# Patient Record
Sex: Female | Born: 1945
Health system: Southern US, Community
[De-identification: ages and names within clinical notes are randomized; demographics above are authoritative.]

## PROBLEM LIST (undated history)

## (undated) DIAGNOSIS — F329 Major depressive disorder, single episode, unspecified: Secondary | ICD-10-CM

## (undated) DIAGNOSIS — U071 COVID-19: Secondary | ICD-10-CM

## (undated) DIAGNOSIS — R5383 Other fatigue: Secondary | ICD-10-CM

## (undated) DIAGNOSIS — R200 Anesthesia of skin: Secondary | ICD-10-CM

## (undated) DIAGNOSIS — I10 Essential (primary) hypertension: Secondary | ICD-10-CM

## (undated) DIAGNOSIS — E669 Obesity, unspecified: Secondary | ICD-10-CM

## (undated) DIAGNOSIS — G4700 Insomnia, unspecified: Secondary | ICD-10-CM

## (undated) DIAGNOSIS — R202 Paresthesia of skin: Secondary | ICD-10-CM

## (undated) DIAGNOSIS — J45909 Unspecified asthma, uncomplicated: Secondary | ICD-10-CM

## (undated) DIAGNOSIS — E559 Vitamin D deficiency, unspecified: Secondary | ICD-10-CM

## (undated) DIAGNOSIS — F32A Depression, unspecified: Secondary | ICD-10-CM

## (undated) DIAGNOSIS — M109 Gout, unspecified: Secondary | ICD-10-CM

## (undated) DIAGNOSIS — E119 Type 2 diabetes mellitus without complications: Secondary | ICD-10-CM

## (undated) HISTORY — DX: Essential (primary) hypertension: I10

## (undated) HISTORY — DX: Obesity, unspecified: E66.9

## (undated) HISTORY — PX: ABDOMINAL HYSTERECTOMY: SHX81

## (undated) HISTORY — DX: Type 2 diabetes mellitus without complications: E11.9

## (undated) HISTORY — DX: Major depressive disorder, single episode, unspecified: F32.9

## (undated) HISTORY — DX: Other fatigue: R53.83

## (undated) HISTORY — DX: Paresthesia of skin: R20.2

## (undated) HISTORY — DX: Anesthesia of skin: R20.0

## (undated) HISTORY — DX: Vitamin D deficiency, unspecified: E55.9

## (undated) HISTORY — DX: Insomnia, unspecified: G47.00

## (undated) HISTORY — DX: Depression, unspecified: F32.A

## (undated) HISTORY — DX: Gout, unspecified: M10.9

## (undated) HISTORY — DX: Unspecified asthma, uncomplicated: J45.909

---

## 1998-02-19 ENCOUNTER — Ambulatory Visit (HOSPITAL_COMMUNITY): Admission: RE | Admit: 1998-02-19 | Discharge: 1998-02-19 | Payer: Self-pay | Admitting: Family Medicine

## 1998-09-05 ENCOUNTER — Ambulatory Visit (HOSPITAL_COMMUNITY): Admission: RE | Admit: 1998-09-05 | Discharge: 1998-09-05 | Payer: Self-pay | Admitting: Family Medicine

## 1999-05-14 ENCOUNTER — Emergency Department (HOSPITAL_COMMUNITY): Admission: EM | Admit: 1999-05-14 | Discharge: 1999-05-14 | Payer: Self-pay

## 2000-04-08 ENCOUNTER — Emergency Department (HOSPITAL_COMMUNITY): Admission: EM | Admit: 2000-04-08 | Discharge: 2000-04-09 | Payer: Self-pay | Admitting: Emergency Medicine

## 2000-04-08 ENCOUNTER — Encounter: Payer: Self-pay | Admitting: Emergency Medicine

## 2000-08-29 ENCOUNTER — Ambulatory Visit (HOSPITAL_BASED_OUTPATIENT_CLINIC_OR_DEPARTMENT_OTHER): Admission: RE | Admit: 2000-08-29 | Discharge: 2000-08-29 | Payer: Self-pay | Admitting: Otolaryngology

## 2000-09-13 ENCOUNTER — Encounter: Payer: Self-pay | Admitting: Family Medicine

## 2000-09-13 ENCOUNTER — Encounter: Admission: RE | Admit: 2000-09-13 | Discharge: 2000-09-13 | Payer: Self-pay | Admitting: Family Medicine

## 2001-09-14 ENCOUNTER — Ambulatory Visit (HOSPITAL_COMMUNITY): Admission: RE | Admit: 2001-09-14 | Discharge: 2001-09-14 | Payer: Self-pay | Admitting: Family Medicine

## 2001-09-14 ENCOUNTER — Encounter: Payer: Self-pay | Admitting: Family Medicine

## 2002-06-06 ENCOUNTER — Emergency Department (HOSPITAL_COMMUNITY): Admission: EM | Admit: 2002-06-06 | Discharge: 2002-06-07 | Payer: Self-pay | Admitting: Anesthesiology

## 2002-12-07 ENCOUNTER — Ambulatory Visit (HOSPITAL_COMMUNITY): Admission: RE | Admit: 2002-12-07 | Discharge: 2002-12-07 | Payer: Self-pay | Admitting: Ophthalmology

## 2002-12-15 ENCOUNTER — Inpatient Hospital Stay (HOSPITAL_COMMUNITY): Admission: EM | Admit: 2002-12-15 | Discharge: 2002-12-17 | Payer: Self-pay | Admitting: Emergency Medicine

## 2002-12-16 ENCOUNTER — Encounter: Payer: Self-pay | Admitting: Internal Medicine

## 2005-02-23 ENCOUNTER — Ambulatory Visit: Payer: Self-pay | Admitting: Internal Medicine

## 2005-03-02 ENCOUNTER — Ambulatory Visit: Payer: Self-pay | Admitting: Internal Medicine

## 2005-10-19 ENCOUNTER — Encounter: Admission: RE | Admit: 2005-10-19 | Discharge: 2005-10-19 | Payer: Self-pay | Admitting: Family Medicine

## 2006-04-06 ENCOUNTER — Emergency Department (HOSPITAL_COMMUNITY): Admission: EM | Admit: 2006-04-06 | Discharge: 2006-04-07 | Payer: Self-pay | Admitting: Emergency Medicine

## 2006-11-29 ENCOUNTER — Ambulatory Visit (HOSPITAL_COMMUNITY): Admission: RE | Admit: 2006-11-29 | Discharge: 2006-11-29 | Payer: Self-pay | Admitting: Family Medicine

## 2008-11-01 ENCOUNTER — Ambulatory Visit (HOSPITAL_COMMUNITY): Admission: RE | Admit: 2008-11-01 | Discharge: 2008-11-01 | Payer: Self-pay

## 2010-01-27 ENCOUNTER — Encounter (INDEPENDENT_AMBULATORY_CARE_PROVIDER_SITE_OTHER): Payer: Self-pay | Admitting: *Deleted

## 2010-12-14 ENCOUNTER — Encounter: Payer: Self-pay | Admitting: Family Medicine

## 2010-12-25 NOTE — Letter (Signed)
Summary: Colonoscopy Letter  [facility]  [address] [place], [place] [address]   Phone: [phone]  Fax: [phone]      January 27, 2010 MRN: [id number]   [facility] [address] [place], [place]  [address]   Dear Caitlin Harrington,   According to your medical record, it is time for you to schedule a Colonoscopy. The American Cancer Society recommends this procedure as a method to detect early colon cancer. Patients with a family history of colon cancer, or a personal history of colon polyps or inflammatory bowel disease are at increased risk.  This letter has been generated based on the recommendations made at the time of your procedure. If you feel that in your particular situation this may no longer apply, please contact our office.  Please call our office at [phone] to schedule this appointment or to update your records at your earliest convenience.  Thank you for cooperating with [place] to provide you with the very best care possible.   Sincerely,  Wilhemina Bonito. Marina Goodell, M.D.  [facility] [phone]

## 2011-02-18 DIAGNOSIS — E669 Obesity, unspecified: Secondary | ICD-10-CM | POA: Insufficient documentation

## 2011-02-18 DIAGNOSIS — Z566 Other physical and mental strain related to work: Secondary | ICD-10-CM | POA: Insufficient documentation

## 2011-02-18 DIAGNOSIS — I10 Essential (primary) hypertension: Secondary | ICD-10-CM | POA: Insufficient documentation

## 2011-02-18 DIAGNOSIS — F4321 Adjustment disorder with depressed mood: Secondary | ICD-10-CM | POA: Insufficient documentation

## 2011-02-18 DIAGNOSIS — E7800 Pure hypercholesterolemia, unspecified: Secondary | ICD-10-CM

## 2011-02-18 DIAGNOSIS — E785 Hyperlipidemia, unspecified: Secondary | ICD-10-CM | POA: Insufficient documentation

## 2011-02-18 DIAGNOSIS — E559 Vitamin D deficiency, unspecified: Secondary | ICD-10-CM | POA: Insufficient documentation

## 2011-02-18 DIAGNOSIS — E6609 Other obesity due to excess calories: Secondary | ICD-10-CM | POA: Insufficient documentation

## 2011-02-18 DIAGNOSIS — G4700 Insomnia, unspecified: Secondary | ICD-10-CM | POA: Insufficient documentation

## 2011-02-18 DIAGNOSIS — J4530 Mild persistent asthma, uncomplicated: Secondary | ICD-10-CM | POA: Insufficient documentation

## 2011-02-18 DIAGNOSIS — E119 Type 2 diabetes mellitus without complications: Secondary | ICD-10-CM | POA: Insufficient documentation

## 2011-04-10 NOTE — Discharge Summary (Signed)
   NAME:  Caitlin Harrington, Caitlin Harrington                         ACCOUNT NO.:  [id number]   MEDICAL RECORD NO.:  [id number]                   PATIENT TYPE:  INP   LOCATION:  3008                                 FACILITY:  [facility]   PHYSICIAN:  Ellender Hose. Earlene Plater, N.P.              DATE OF BIRTH:  1945/11/30   DATE OF ADMISSION:  12/15/2002  DATE OF DISCHARGE:  12/17/2002                                 DISCHARGE SUMMARY   ADDENDUM:   PRIMARY CARE PHYSICIAN:  Leodis Sias, M.D.   Regarding the patient's abnormal CT findings, which suggested potential  small bleed in the right internal capsule, the patient underwent MRI of the  brain at [facility]. The findings were reviewed with Neurologic  Radiologist, Dr. Alberteen Spindle. Findings included ischemic changes, which were  also present on a CT of May 2001. There was no indication of acute infarct  or bleed. No pathological cerebral edema and a potentially old lacunar  infarct in the left occipital area. Given these findings, it is felt that  the patient's hypertension did not at this time, cause any acute brain  injury or infarct and she is free of focal deficits at the time of  discharge.   The patient was provided with prescriptions for seven-day supply of her  medications, due to her sister having taken her medications to [place].                                               Ellender Hose. Earlene Plater, N.P.    SMD/MEDQ  D:  12/17/2002  T:  12/17/2002  Job:  [id number]   cc:   Thelma Barge P. Modesto Charon, M.D.  [address]  [place]  [place] [address]  Fax: [phone]

## 2011-04-10 NOTE — Discharge Summary (Signed)
NAME:  Caitlin Harrington, EDELSON                         ACCOUNT NO.:  [id number]   MEDICAL RECORD NO.:  [id number]                   PATIENT TYPE:  INP   LOCATION:  3008                                 FACILITY:  [facility]   PHYSICIAN:  Deirdre Peer. Polite, M.D.              DATE OF BIRTH:  1946-03-05   DATE OF ADMISSION:  12/15/2002  DATE OF DISCHARGE:  12/16/2002                                 DISCHARGE SUMMARY   PATIENT'S PRIMARY CARE PHYSICIAN:  Francis P. Modesto Charon, M.D.   DISCHARGE DIAGNOSES:  1. Headache, resolved.  2. Malignant hypertension, controlled.  3. Abnormal computerized tomography scan of the head, resolved.   DISCHARGE MEDICATIONS:  Altace 10 mg twice a day, Norvasc 5 mg a day, HCTZ  25 mg a day, Prozac 20 mg a day, Claritin 10 mg a day, Advair 250/50 daily..   ALLERGIES:  No known drug allergies.   PROCEDURES:  None.   HISTORY OF PRESENT ILLNESS:  Patient presents with history of hypertension  and diabetes.  Seen at primary MD office today for history of one week of  headache with increased blood pressure.  CT done as an outpatient which  showed questionable small bleed in  the left internal capsule area.  Patient  sent to the emergency room for full evaluation.  Patient denies nausea,  vomiting, chest pain, shortness of breath.  Does have headache.  No  extremity weakness.  No problems with her speech or swallowing.  No visual  disturbance.   Blood pressure in emergency room found to be 160/103.  Pulse was 50.  Patient was admitted for further evaluation.   HOSPITAL COURSE:  Patient had been taking Altace and hydrochlorothiazide for  her blood pressure and was started on Norvasc by Dr. Modesto Charon on the day of  presentation.  These medications were continued with excellent results.  Patient initially required transdermal nitroglycerin for blood pressure  control.  She had excellent response to the above medications as noted.  Blood pressure at time of discharge was 139/73, pulse  was 69, respirations  20.  Patient is free of headache at this time.  Did not experience any  nausea or vomiting during this visit.  Never had any focal neurological  deficits.  She did complain of some blurriness of vision, worse when  reading.  She states that this is improved on day of discharge.   LABORATORY TESTS:  Patient presented with a history of diet-controlled  diabetes.  Her blood sugars during this stay were 113, 117, 100, and 110.  Hemoglobin A1c is 5.9.  These numbers indicate the patient may not actually  have diabetes at this time.  Continued monitoring would be recommended.  Patient is still encouraged to limit concentrated sweets in her diet.   Two sets of cardiac enzymes were performed and were negative.  A 12-lead EKG  revealed normal sinus rhythm with no acute ST wave changes  or ectopy.  Telemetry monitoring revealed the same.  Outpatient echocardiogram maybe  warranted to assess for hypertensive changes to the heart.  Patient is free  of signs or symptoms at this time.   Patient has discharged to home to continue medications as noted above and to  follow up with Dr. Modesto Charon within the week.   DISCHARGE LABS:  Sodium 138, potassium 4.5, BUN 12, creatinine 0.9.  White  blood cell count 10.4, hemoglobin 14.0, hematocrit 40.3, platelet count 306.   CONSULTATIONS:  None.   CONDITION ON DISCHARGE:  Good.   DISPOSITION:  Discharged to home.  Followup is to be with Leodis Sias, M.D.  some time in the next seven days for recheck of her blood pressure and  general hospital followup.     Caitlin Harrington. [name]. Polite, M.D.    SMD/MEDQ  D:  12/17/2002  T:  12/17/2002  Job:  [id number]   cc:   Thelma Barge P. Modesto Charon, M.D.  [address]  [place]  [place] [address]  Fax: [phone]

## 2011-04-10 NOTE — H&P (Signed)
NAME:  Caitlin Harrington, Caitlin Harrington                         ACCOUNT NO.:  [id number]   MEDICAL RECORD NO.:  [id number]                   PATIENT TYPE:  INP   LOCATION:  3312                                 FACILITY:  [facility]   PHYSICIAN:  Georgann Housekeeper, M.D.                 DATE OF BIRTH:  [birth year]   DATE OF ADMISSION:  12/15/2002  DATE OF DISCHARGE:                                HISTORY & PHYSICAL   CHIEF COMPLAINT:  Headaches, elevated blood pressure, and bleed on CT scan  of the head.   PRIMARY CARE PHYSICIAN:  Francis P. Modesto Charon, M.D.   HISTORY OF PRESENT ILLNESS:  A 65 year old female with history of  hypertension, diabetes, and mild asthma, presented to the office of Francis  P. Modesto Charon, M.D. today with history of one week of headaches, found to have  elevated blood pressure, and he did an outpatient CT scan of the head at  [facility], called in report with questionable left capsular bleed,  small.  The patient was sent to the emergency room for further evaluation.  The patient denies any chest pain, shortness of breath, no nausea or  vomiting, positive headaches.  No extremity weakness, no trouble with speech  or swallowing, and no vision problems.  In the emergency room blood pressure  was 116/103 initially.  With nitro paste, it came down to 170/73.  The  patient was still complaining of some headaches.   PAST MEDICAL HISTORY:  1. Hypertension.  2. Asthma.  3. Type 2 diabetes, diet controlled.  4. Gastroesophageal reflux disease, diet controlled.   MEDICATIONS:  1. Altace 10 mg b.i.d.  2. Prozac 20 mg daily.  3. Hydrochlorothiazide 25 mg daily.  4. Advair 250, 1 inhalation b.i.d. p.r.n.  5. Norvasc 5 mg was given today, started by Dr. Modesto Charon.   PAST SURGICAL HISTORY:  None.   SOCIAL HISTORY:  No tobacco, no alcohol, works two jobs, one during the  daytime at [employer] and also does [employer] during the  nighttime.  She is married with one child, and her sister and  her husband  were present.   REVIEW OF SYSTEMS:  Negative.   FAMILY HISTORY:  Noncontributory.   PHYSICAL EXAMINATION:  VITAL SIGNS:  Blood pressure 116/103 initially, at  the time of the exam, 170/83.  Pulse of 80.  Respirations 20, saturations  93%.  GENERAL:  Awake, alert, in no acute distress, complaining of some headache.  LUNGS:  Clear.  CARDIAC:  S1, S2 without any murmurs.  NECK:  There is no bruit.  Supple.  ABDOMEN:  Soft, positive bowel sounds, no bruits.  NEUROLOGIC:  Nonfocal.   LABORATORY DATA:  White count 10.4, hemoglobin 14, platelets 306.  Sodium  136, potassium 4.5, BUN 12, creatinine .9, glucose 113.  LFTs are normal.  Coags normal.  CPK 100, MB 1.2, troponin .01.  EKG was ordered.  Head CT  as  per the report from the [facility] showed question of a left capsular  bleed, small.   IMPRESSION:  A 65 year old female with elevated blood pressure, diabetes,  history of headaches for one week.  CT scan finding of questionable small  cerebral bleed.  Neurologic exam nonfocal.  1. Cerebral bleeding.  2. Malignant hypertension.   PLAN:  1. Admit to telemetry step-down.  2. Blood pressure control.  3. Repeat the scan.  4. MRI in the morning and echocardiogram.  5. Diabetes, diet controlled.  Continue with the diet.  6. Reflux and asthma, stable.                                               Georgann Housekeeper, M.D.    KH/MEDQ  D:  12/15/2002  T:  12/16/2002  Job:  [id number]   cc:   Thelma Barge P. Modesto Charon, M.D.  [address]  [place]  [place] [address]  Fax: [phone]

## 2011-07-01 ENCOUNTER — Telehealth: Payer: Self-pay | Admitting: *Deleted

## 2011-07-01 NOTE — Telephone Encounter (Signed)
Called patient and left message for her to call back and schedule colonoscopy

## 2011-07-09 NOTE — Telephone Encounter (Signed)
Called patient again and no answer.  Left message for her to call and schedule.

## 2012-09-21 ENCOUNTER — Other Ambulatory Visit: Payer: Self-pay | Admitting: Family Medicine

## 2012-09-21 DIAGNOSIS — I998 Other disorder of circulatory system: Secondary | ICD-10-CM

## 2012-09-21 DIAGNOSIS — R51 Headache: Secondary | ICD-10-CM

## 2012-09-22 ENCOUNTER — Ambulatory Visit (HOSPITAL_COMMUNITY)
Admission: RE | Admit: 2012-09-22 | Discharge: 2012-09-22 | Disposition: A | Discharge status: Home / Self Care | Payer: BC Managed Care – PPO | Source: Ambulatory Visit | Attending: Family Medicine | Admitting: Family Medicine

## 2012-09-22 DIAGNOSIS — E119 Type 2 diabetes mellitus without complications: Secondary | ICD-10-CM | POA: Insufficient documentation

## 2012-09-22 DIAGNOSIS — R42 Dizziness and giddiness: Secondary | ICD-10-CM | POA: Insufficient documentation

## 2012-09-22 DIAGNOSIS — R51 Headache: Secondary | ICD-10-CM

## 2012-09-22 DIAGNOSIS — I999 Unspecified disorder of circulatory system: Secondary | ICD-10-CM | POA: Insufficient documentation

## 2012-09-22 DIAGNOSIS — I998 Other disorder of circulatory system: Secondary | ICD-10-CM

## 2012-09-22 MED ORDER — GADOBENATE DIMEGLUMINE 529 MG/ML IV SOLN: 18.0000 mL | Freq: Once | INTRAVENOUS | Status: AC | PRN

## 2013-02-09 ENCOUNTER — Ambulatory Visit: Payer: Self-pay | Admitting: Family Medicine

## 2013-09-14 ENCOUNTER — Telehealth: Payer: Self-pay | Admitting: Family Medicine

## 2013-09-18 ENCOUNTER — Encounter: Payer: Self-pay | Admitting: Family Medicine

## 2013-09-18 ENCOUNTER — Ambulatory Visit (INDEPENDENT_AMBULATORY_CARE_PROVIDER_SITE_OTHER): Payer: Medicare Other | Admitting: Family Medicine

## 2013-09-18 ENCOUNTER — Encounter (INDEPENDENT_AMBULATORY_CARE_PROVIDER_SITE_OTHER): Payer: Self-pay

## 2013-09-18 VITALS — BP 209/123 | HR 75 | Temp 97.2°F | Ht 60.0 in | Wt 196.2 lb

## 2013-09-18 DIAGNOSIS — E559 Vitamin D deficiency, unspecified: Secondary | ICD-10-CM

## 2013-09-18 DIAGNOSIS — M109 Gout, unspecified: Secondary | ICD-10-CM

## 2013-09-18 DIAGNOSIS — E669 Obesity, unspecified: Secondary | ICD-10-CM

## 2013-09-18 DIAGNOSIS — I10 Essential (primary) hypertension: Secondary | ICD-10-CM | POA: Diagnosis not present

## 2013-09-18 DIAGNOSIS — E7800 Pure hypercholesterolemia, unspecified: Secondary | ICD-10-CM | POA: Diagnosis not present

## 2013-09-18 DIAGNOSIS — E119 Type 2 diabetes mellitus without complications: Secondary | ICD-10-CM | POA: Diagnosis not present

## 2013-09-18 DIAGNOSIS — E790 Hyperuricemia without signs of inflammatory arthritis and tophaceous disease: Secondary | ICD-10-CM | POA: Insufficient documentation

## 2013-09-18 DIAGNOSIS — F329 Major depressive disorder, single episode, unspecified: Secondary | ICD-10-CM

## 2013-09-18 DIAGNOSIS — F4321 Adjustment disorder with depressed mood: Secondary | ICD-10-CM

## 2013-09-18 LAB — POCT GLYCOSYLATED HEMOGLOBIN (HGB A1C): Hemoglobin A1C: 6.8

## 2013-09-18 MED ORDER — FLUOXETINE HCL 20 MG PO TABS: 20.0000 mg | ORAL_TABLET | Freq: Every day | ORAL | Status: DC

## 2013-09-18 MED ORDER — COLCHICINE 0.6 MG PO TABS: 0.6000 mg | ORAL_TABLET | Freq: Two times a day (BID) | ORAL | Status: DC

## 2013-09-18 MED ORDER — ATORVASTATIN CALCIUM 10 MG PO TABS: 10.0000 mg | ORAL_TABLET | Freq: Every day | ORAL | Status: DC

## 2013-09-18 MED ORDER — OLMESARTAN-AMLODIPINE-HCTZ 40-5-12.5 MG PO TABS: 1.0000 | ORAL_TABLET | Freq: Every day | ORAL | Status: DC

## 2013-09-18 MED ORDER — BUPROPION HCL 100 MG PO TABS: 100.0000 mg | ORAL_TABLET | Freq: Two times a day (BID) | ORAL | Status: DC

## 2013-09-18 MED ORDER — ALLOPURINOL 100 MG PO TABS: 100.0000 mg | ORAL_TABLET | Freq: Every day | ORAL | Status: DC

## 2013-09-18 MED ORDER — GLIMEPIRIDE 2 MG PO TABS: 2.0000 mg | ORAL_TABLET | Freq: Every day | ORAL | Status: DC

## 2013-09-18 NOTE — Patient Instructions (Signed)
Dr Woodroe Mode Recommendations  For nutrition information, I recommend books:  1).Eat to Live by Dr Monico Hoar. 2).Prevent and Reverse Heart Disease by Dr Suzzette Righter. 3) Dr Katherina Right Book:  Program to Reverse Diabetes  Exercise recommendations are:  If unable to walk, then the patient can exercise in a chair 3 times a day. By flapping arms like a bird gently and raising legs outwards to the front.  If ambulatory, the patient can go for walks for 30 minutes 3 times a week. Then increase the intensity and duration as tolerated.  Goal is to try to attain exercise frequency to 5 times a week.  If applicable: Best to perform resistance exercises (machines or weights) 2 days a week and cardio type exercises 3 days per week.   Gout Gout is an inflammatory condition (arthritis) caused by a buildup of uric acid crystals in the joints. Uric acid is a chemical that is normally present in the blood. Under some circumstances, uric acid can form into crystals in your joints. This causes joint redness, soreness, and swelling (inflammation). Repeat attacks are common. Over time, uric acid crystals can form into masses (tophi) near a joint, causing disfigurement. Gout is treatable and often preventable. CAUSES  The disease begins with elevated levels of uric acid in the blood. Uric acid is produced by your body when it breaks down a naturally found substance called purines. This also happens when you eat certain foods such as meats and fish. Causes of an elevated uric acid level include:  Being passed down from parent to child (heredity).  Diseases that cause increased uric acid production (obesity, psoriasis, some cancers).  Excessive alcohol use.  Diet, especially diets rich in meat and seafood.  Medicines, including certain cancer-fighting drugs (chemotherapy), diuretics, and aspirin.  Chronic kidney disease. The kidneys are no longer able to remove uric acid  well.  Problems with metabolism. Conditions strongly associated with gout include:  Obesity.  High blood pressure.  High cholesterol.  Diabetes. Not everyone with elevated uric acid levels gets gout. It is not understood why some people get gout and others do not. Surgery, joint injury, and eating too much of certain foods are some of the factors that can lead to gout. SYMPTOMS   An attack of gout comes on quickly. It causes intense pain with redness, swelling, and warmth in a joint.  Fever can occur.  Often, only one joint is involved. Certain joints are more commonly involved:  Base of the big toe.  Knee.  Ankle.  Wrist.  Finger. Without treatment, an attack usually goes away in a few days to weeks. Between attacks, you usually will not have symptoms, which is different from many other forms of arthritis. DIAGNOSIS  Your caregiver will suspect gout based on your symptoms and exam. Removal of fluid from the joint (arthrocentesis) is done to check for uric acid crystals. Your caregiver will give you a medicine that numbs the area (local anesthetic) and use a needle to remove joint fluid for exam. Gout is confirmed when uric acid crystals are seen in joint fluid, using a special microscope. Sometimes, blood, urine, and X-ray tests are also used. TREATMENT  There are 2 phases to gout treatment: treating the sudden onset (acute) attack and preventing attacks (prophylaxis). Treatment of an Acute Attack  Medicines are used. These include anti-inflammatory medicines or steroid medicines.  An injection of steroid medicine into the affected joint is sometimes necessary.  The painful  joint is rested. Movement can worsen the arthritis.  You may use warm or cold treatments on painful joints, depending which works best for you.  Discuss the use of coffee, vitamin C, or cherries with your caregiver. These may be helpful treatment options. Treatment to Prevent Attacks After the acute  attack subsides, your caregiver may advise prophylactic medicine. These medicines either help your kidneys eliminate uric acid from your body or decrease your uric acid production. You may need to stay on these medicines for a very long time. The early phase of treatment with prophylactic medicine can be associated with an increase in acute gout attacks. For this reason, during the first few months of treatment, your caregiver may also advise you to take medicines usually used for acute gout treatment. Be sure you understand your caregiver's directions. You should also discuss dietary treatment with your caregiver. Certain foods such as meats and fish can increase uric acid levels. Other foods such as dairy can decrease levels. Your caregiver can give you a list of foods to avoid. HOME CARE INSTRUCTIONS   Do not take aspirin to relieve pain. This raises uric acid levels.  Only take over-the-counter or prescription medicines for pain, discomfort, or fever as directed by your caregiver.  Rest the joint as much as possible. When in bed, keep sheets and blankets off painful areas.  Keep the affected joint raised (elevated).  Use crutches if the painful joint is in your leg.  Drink enough water and fluids to keep your urine clear or pale yellow. This helps your body get rid of uric acid. Do not drink alcoholic beverages. They slow the passage of uric acid.  Follow your caregiver's dietary instructions. Pay careful attention to the amount of protein you eat. Your daily diet should emphasize fruits, vegetables, whole grains, and fat-free or low-fat milk products.  Maintain a healthy body weight. SEEK MEDICAL CARE IF:   You have an oral temperature above 102 F (38.9 C).  You develop diarrhea, vomiting, or any side effects from medicines.  You do not feel better in 24 hours, or you are getting worse. SEEK IMMEDIATE MEDICAL CARE IF:   Your joint becomes suddenly more tender and you  have:  Chills.  An oral temperature above 102 F (38.9 C), not controlled by medicine. MAKE SURE YOU:   Understand these instructions.  Will watch your condition.  Will get help right away if you are not doing well or get worse. Document Released: 11/06/2000 Document Revised: 02/01/2012 Document Reviewed: 02/17/2010 [facility] Patient Information 2014 [place], [place].   Purine Restricted Diet A low-purine diet consists of foods that reduce uric acid made in your body. INDICATIONS FOR USE  Your caregiver may ask you to follow a low-purine diet to reduce gout flairs.  GUIDELINES  Avoid high-purine foods, including all alcohol, yeast extracts taken as supplements, and sauces made from meats (like gravy). Do not eat high-purine meats, including anchovies, sardines, herring, mussels, tuna, codfish, scallops, trout, haddock, bacon, organ meats, tripe, goose, wild game, and sweetbreads.  Grains  Allowed/Recommended: All, except those listed to consume in moderation.  Consume in Moderation: Oatmeal ( cup uncooked daily), wheat bran or germ ( cup daily), and whole grains. Vegetables  Allowed/Recommended: All, except those listed to consume in moderation.  Consume in Moderation: Asparagus, cauliflower, spinach, mushrooms, and green peas ( cup daily). Fruit  Allowed/Recommended: All.  Consume in Moderation: None. Meat and Meat Substitutes  Allowed/Recommended: Eggs, nuts, and peanut butter.  Consume in  Moderation: Limit to 4 to 6 oz daily. Avoid high-purine meats. Lentils, peas, and dried beans (1 cup daily). Milk  Allowed/Recommended: All. Choose low-fat or skim when possible.  Consume in Moderation: None. Fats and Oils  Allowed/Recommended: All.  Consume in Moderation: None. Beverages  Allowed/Recommended: All, except those listed to avoid.  Avoid: All alcohol. Condiments/Miscellaneous  Allowed/Recommended: All, except those listed to consume in  moderation.  Consume in Moderation: Bouillon and meat-based broths and soups. Document Released: 03/06/2011 Document Revised: 02/01/2012 Document Reviewed: 03/06/2011 [facility] Patient Information 2014 [place], [place].   DASH Diet The DASH diet stands for "Dietary Approaches to Stop Hypertension." It is a healthy eating plan that has been shown to reduce high blood pressure (hypertension) in as little as 14 days, while also possibly providing other significant health benefits. These other health benefits include reducing the risk of breast cancer after menopause and reducing the risk of type 2 diabetes, heart disease, colon cancer, and stroke. Health benefits also include weight loss and slowing kidney failure in patients with chronic kidney disease.  DIET GUIDELINES  Limit salt (sodium). Your diet should contain less than 1500 mg of sodium daily.  Limit refined or processed carbohydrates. Your diet should include mostly whole grains. Desserts and added sugars should be used sparingly.  Include small amounts of heart-healthy fats. These types of fats include nuts, oils, and tub margarine. Limit saturated and trans fats. These fats have been shown to be harmful in the body. CHOOSING FOODS  The following food groups are based on a 2000 calorie diet. See your Registered Dietitian for individual calorie needs. Grains and Grain Products (6 to 8 servings daily)  Eat More Often: Whole-wheat bread, brown rice, whole-grain or wheat pasta, quinoa, popcorn without added fat or salt (air popped).  Eat Less Often: White bread, white pasta, white rice, cornbread. Vegetables (4 to 5 servings daily)  Eat More Often: Fresh, frozen, and canned vegetables. Vegetables may be raw, steamed, roasted, or grilled with a minimal amount of fat.  Eat Less Often/Avoid: Creamed or fried vegetables. Vegetables in a cheese sauce. Fruit (4 to 5 servings daily)  Eat More Often: All fresh, canned (in natural juice), or  frozen fruits. Dried fruits without added sugar. One hundred percent fruit juice ( cup [237 mL] daily).  Eat Less Often: Dried fruits with added sugar. Canned fruit in light or heavy syrup. [employer], Fish, and Poultry (2 servings or less daily. One serving is 3 to 4 oz [85-114 g]).  Eat More Often: Ninety percent or leaner ground beef, tenderloin, sirloin. Round cuts of beef, chicken breast, [place] breast. All fish. Grill, bake, or broil your meat. Nothing should be fried.  Eat Less Often/Avoid: Fatty cuts of meat, [place], or chicken leg, thigh, or wing. Fried cuts of meat or fish. Dairy (2 to 3 servings)  Eat More Often: Low-fat or fat-free milk, low-fat plain or light yogurt, reduced-fat or part-skim cheese.  Eat Less Often/Avoid: Milk (whole, 2%).Whole milk yogurt. Full-fat cheeses. Nuts, Seeds, and Legumes (4 to 5 servings per week)  Eat More Often: All without added salt.  Eat Less Often/Avoid: Salted nuts and seeds, canned beans with added salt. Fats and Sweets (limited)  Eat More Often: Vegetable oils, tub margarines without trans fats, sugar-free gelatin. Mayonnaise and salad dressings.  Eat Less Often/Avoid: Coconut oils, palm oils, butter, stick margarine, cream, half and half, cookies, candy, pie. FOR MORE INFORMATION The Dash Diet Eating Plan: www.dashdiet.org Document Released: 10/29/2011 Document Revised: 02/01/2012  Document Reviewed: 10/29/2011 [facility] Patient Information 2014 [place], [place].   Diabetes and Foot Care Diabetes may cause you to have a poor blood supply (circulation) to your legs and feet. Because of this, the skin may be thinner, break easier, and heal more slowly. You also may have nerve damage in your legs and feet causing decreased feeling. You may not notice minor injuries to your feet that could lead to serious problems or infections. Taking care of your feet is one of the most important things you can do for yourself.  HOME CARE  INSTRUCTIONS  Do not go barefoot. Bare feet are easily injured.  Check your feet daily for blisters, cuts, and redness.  Wash your feet with warm water (not hot) and mild soap. Pat your feet and between your toes until completely dry.  Apply a moisturizing lotion that does not contain alcohol or petroleum jelly to the dry skin on your feet and to dry brittle toenails. Do not put it between your toes.  Trim your toenails straight across. Do not dig under them or around the cuticle.  Do not cut corns or calluses, or try to remove them with medicine.  Wear clean cotton socks or stockings every day. Make sure they are not too tight. Do not wear knee high stockings since they may decrease blood flow to your legs.  Wear leather shoes that fit properly and have enough cushioning. To break in new shoes, wear them just a few hours a day to avoid injuring your feet.  Wear shoes at all times, even in the house.  Do not cross your legs. This may decrease the blood flow to your feet.  If you find a minor scrape, cut, or break in the skin on your feet, keep it and the skin around it clean and dry. These areas may be cleansed with mild soap and water. Do not use peroxide, alcohol, iodine or Merthiolate.  When you remove an adhesive bandage, be sure not to harm the skin around it.  If you have a wound, look at it several times a day to make sure it is healing.  Do not use heating pads or hot water bottles. Burns can occur. If you have lost feeling in your feet or legs, you may not know it is happening until it is too late.  Report any cuts, sores or bruises to your caregiver. Do not wait! SEEK MEDICAL CARE IF:   You have an injury that is not healing or you notice redness, numbness, burning, or tingling.  Your feet always feel cold.  You have pain or cramps in your legs and feet. SEEK IMMEDIATE MEDICAL CARE IF:   There is increasing redness, swelling, or increasing pain in the wound.  There  is a red line that goes up your leg.  Pus is coming from a wound.  You develop an unexplained oral temperature above 102 F (38.9 C), or as your caregiver suggests.  You notice a bad smell coming from an ulcer or wound. MAKE SURE YOU:   Understand these instructions.  Will watch your condition.  Will get help right away if you are not doing well or get worse. Document Released: 11/06/2000 Document Revised: 02/01/2012 Document Reviewed: 05/15/2009 [facility] Patient Information 2014 [place], [place].

## 2013-09-18 NOTE — Progress Notes (Signed)
Patient ID: Caitlin Harrington, female   DOB: February 25, 1946, 67 y.o.   MRN: [id number] SUBJECTIVE: CC: Chief Complaint  Patient presents with  . Follow-up    c/o gout states been off meds since "months"    HPI: Gout attack. Big toes are sore. Has stopped all her medications for 2 months. Afraid of medications due to the TV reports about side effects of metformin.she has not followed up on her medical problems.  Patient is here for follow up of Diabetes Mellitus: Symptoms evaluated: Denies Nocturia ,Denies Urinary Frequency , denies Blurred vision ,deniesDizziness,denies.Dysuria,denies paresthesias, denies extremity pain or ulcers.Marland Kitchendenies chest pain. has had an annual eye exam. do check the feet. Does check CBGs. Average CBG:200+ Denies episodes of hypoglycemia. Does have an emergency hypoglycemic plan. admits to NON-Compliance with medications. Denies Problems with medications.  Patient is here for follow up of hypertension: denies Headache;deniesChest Pain;denies weakness;denies Shortness of Breath or Orthopnea;denies Visual changes;denies palpitations;denies cough;denies pedal edema;denies symptoms of TIA or stroke; admits to NON-Compliance with medications. denies Problems with medications.   Past Medical History  Diagnosis Date  . Hypertension   . Obesity   . Asthma   . Vitamin D deficiency   . Diabetes mellitus without complication   . Gout   . Depression   . Insomnia    No past surgical history on file. History   Social History  . Marital Status: Single    Spouse Name: N/A    Number of Children: N/A  . Years of Education: N/A   Occupational History  . Not on file.   Social History Main Topics  . Smoking status: Never Smoker   . Smokeless tobacco: Not on file  . Alcohol Use: Not on file  . Drug Use: Not on file  . Sexual Activity: Not on file   Other Topics Concern  . Not on file   Social History Narrative  . No narrative on file   No family history on  file. Current Outpatient Prescriptions on File Prior to Visit  Medication Sig Dispense Refill  . albuterol (PROVENTIL HFA) 108 (90 BASE) MCG/ACT inhaler Inhale 2 puffs into the lungs every 6 (six) hours as needed.        . Cholecalciferol (VITAMIN D) 2000 UNITS CAPS Take 1 capsule by mouth daily.        . clonazePAM (KLONOPIN) 0.5 MG tablet Take 0.5 mg by mouth at bedtime as needed.         No current facility-administered medications on file prior to visit.   Allergies  Allergen Reactions  . Meloxicam     There is no immunization history on file for this patient. Prior to Admission medications   Medication Sig Start Date End Date Taking? Authorizing Provider  albuterol (PROVENTIL HFA) 108 (90 BASE) MCG/ACT inhaler Inhale 2 puffs into the lungs every 6 (six) hours as needed.      Historical Provider, MD  atorvastatin (LIPITOR) 10 MG tablet Take 10 mg by mouth daily.      Historical Provider, MD  buPROPion (WELLBUTRIN) 100 MG tablet Take 100 mg by mouth 2 (two) times daily.      Historical Provider, MD  Cholecalciferol (VITAMIN D) 2000 UNITS CAPS Take 1 capsule by mouth daily.      Historical Provider, MD  clonazePAM (KLONOPIN) 0.5 MG tablet Take 0.5 mg by mouth at bedtime as needed.      Historical Provider, MD  FLUoxetine (PROZAC) 20 MG tablet Take 20 mg by mouth daily.  Historical Provider, MD  metFORMIN (GLUCOPHAGE) 1000 MG tablet Take 1,000 mg by mouth daily with breakfast.      Historical Provider, MD  Olmesartan-Amlodipine-HCTZ (TRIBENZOR) 40-5-12.5 MG TABS Take 1 tablet by mouth daily.      Historical Provider, MD     ROS: As above in the HPI. All other systems are stable or negative.  OBJECTIVE: APPEARANCE:  Patient in no acute distress.The patient appeared well nourished and normally developed. Acyanotic. Waist: VITAL SIGNS:BP 209/123  Pulse 75  Temp(Src) 97.2 F (36.2 C) (Oral)  Ht 5' (1.524 m)  Wt 196 lb 3.2 oz (88.996 kg)  BMI 38.32 kg/m2 Obese WF  SKIN:  warm and  Dry without overt rashes, tattoos and scars  HEAD and Neck: without JVD, Head and scalp: normal Eyes:No scleral icterus. Fundi normal, eye movements normal. Ears: Auricle normal, canal normal, Tympanic membranes normal, insufflation normal. Nose: normal Throat: normal Neck & thyroid: normal  CHEST & LUNGS: Chest wall: normal Lungs: Clear  [facility]: Reveals the PMI to be normally located. Regular rhythm, First and Second Heart sounds are normal,  absence of murmurs, rubs or gallops. Peripheral vasculature: Radial pulses: normal Dorsal pedis pulses: normal Posterior pulses: normal  ABDOMEN:  Appearance: obese Benign, no organomegaly, no masses, no Abdominal Aortic enlargement. No Guarding , no rebound. No Bruits. Bowel sounds: normal  RECTAL: N/A GU: N/A  EXTREMETIES: nonedematous.  MUSCULOSKELETAL:  Spine: normal Joints: 1st metatarsophalangeal joints bilaterally tender.  NEUROLOGIC: oriented to time,place and person; nonfocal. Strength is normal Sensory is normal Reflexes are normal Cranial Nerves are normal.  ASSESSMENT: HTN (hypertension) - Plan: Olmesartan-Amlodipine-HCTZ (TRIBENZOR) 40-5-12.5 MG TABS  Prolonged depressive adjustment reaction - Plan: buPROPion (WELLBUTRIN) 100 MG tablet, FLUoxetine (PROZAC) 20 MG tablet  Obesity  Vitamin D deficiency - Plan: Vit D  25 hydroxy (rtn osteoporosis monitoring)  DM (diabetes mellitus) - Plan: glimepiride (AMARYL) 2 MG tablet, POCT glycosylated hemoglobin (Hb A1C), CMP14+EGFR  Hypercholesteremia - Plan: atorvastatin (LIPITOR) 10 MG tablet, Lipid panel  Gout - Plan: colchicine 0.6 MG tablet, allopurinol (ZYLOPRIM) 100 MG tablet, Uric acid  Poor compliance.  PLAN:  Orders Placed This Encounter  Procedures  . CMP14+EGFR  . Lipid panel  . Uric acid  . Vit D  25 hydroxy (rtn osteoporosis monitoring)  . POCT glycosylated hemoglobin (Hb A1C)   Meds ordered this encounter  Medications  .  Olmesartan-Amlodipine-HCTZ (TRIBENZOR) 40-5-12.5 MG TABS    Sig: Take 1 tablet by mouth daily.    Dispense:  30 tablet    Refill:  5  . atorvastatin (LIPITOR) 10 MG tablet    Sig: Take 1 tablet (10 mg total) by mouth daily.    Dispense:  30 tablet    Refill:  5  . buPROPion (WELLBUTRIN) 100 MG tablet    Sig: Take 1 tablet (100 mg total) by mouth 2 (two) times daily.    Dispense:  60 tablet    Refill:  5  . FLUoxetine (PROZAC) 20 MG tablet    Sig: Take 1 tablet (20 mg total) by mouth daily.    Dispense:  30 tablet    Refill:  5  . glimepiride (AMARYL) 2 MG tablet    Sig: Take 1 tablet (2 mg total) by mouth daily before breakfast.    Dispense:  30 tablet    Refill:  5  . colchicine 0.6 MG tablet    Sig: Take 1 tablet (0.6 mg total) by mouth 2 (two) times daily.    Dispense:  60 tablet    Refill:  2  . allopurinol (ZYLOPRIM) 100 MG tablet    Sig: Take 1 tablet (100 mg total) by mouth daily.    Dispense:  30 tablet    Refill:  6   Medications Discontinued During This Encounter  Medication Reason  . metFORMIN (GLUCOPHAGE) 1000 MG tablet Patient has not taken in last 30 days  . Olmesartan-Amlodipine-HCTZ (TRIBENZOR) 40-5-12.5 MG TABS Reorder  . atorvastatin (LIPITOR) 10 MG tablet Reorder  . buPROPion (WELLBUTRIN) 100 MG tablet Reorder  . FLUoxetine (PROZAC) 20 MG tablet Reorder        Dr Woodroe Mode Recommendations  For nutrition information, I recommend books:  1).Eat to Live by Dr Monico Hoar. 2).Prevent and Reverse Heart Disease by Dr Suzzette Righter. 3) Dr Katherina Right Book:  Program to Reverse Diabetes  Exercise recommendations are:  If unable to walk, then the patient can exercise in a chair 3 times a day. By flapping arms like a bird gently and raising legs outwards to the front.  If ambulatory, the patient can go for walks for 30 minutes 3 times a week. Then increase the intensity and duration as tolerated.  Goal is to try to attain exercise frequency to 5  times a week.  If applicable: Best to perform resistance exercises (machines or weights) 2 days a week and cardio type exercises 3 days per week.  Return in about 4 days (around 09/22/2013) for recheck BP.  Esiah Bazinet P. [place] Charon, M.D.

## 2013-09-19 ENCOUNTER — Telehealth: Payer: Self-pay | Admitting: Family Medicine

## 2013-09-19 LAB — CMP14+EGFR
ALT: 36 IU/L — ABNORMAL HIGH (ref 0–32)
AST: 34 IU/L (ref 0–40)
Albumin/Globulin Ratio: 1.5 (ref 1.1–2.5)
Albumin: 4.4 g/dL (ref 3.6–4.8)
Alkaline Phosphatase: 124 IU/L — ABNORMAL HIGH (ref 39–117)
BUN/Creatinine Ratio: 14 (ref 11–26)
BUN: 11 mg/dL (ref 8–27)
CO2: 25 mmol/L (ref 18–29)
Calcium: 9.9 mg/dL (ref 8.6–10.2)
Chloride: 99 mmol/L (ref 97–108)
Creatinine, Ser: 0.79 mg/dL (ref 0.57–1.00)
GFR calc Af Amer: 91 mL/min/{1.73_m2} (ref >59)
GFR calc non Af Amer: 79 mL/min/{1.73_m2} (ref >59)
Globulin, Total: 2.9 g/dL (ref 1.5–4.5)
Glucose: 142 mg/dL — ABNORMAL HIGH (ref 65–99)
Potassium: 5.4 mmol/L — ABNORMAL HIGH (ref 3.5–5.2)
Sodium: 140 mmol/L (ref 134–144)
Total Bilirubin: 0.3 mg/dL (ref 0.0–1.2)
Total Protein: 7.3 g/dL (ref 6.0–8.5)

## 2013-09-19 LAB — LIPID PANEL
Chol/HDL Ratio: 4.4 ratio units (ref 0.0–4.4)
Cholesterol, Total: 219 mg/dL — ABNORMAL HIGH (ref 100–199)
HDL: 50 mg/dL (ref >39)
LDL Calculated: 142 mg/dL — ABNORMAL HIGH (ref 0–99)
Triglycerides: 133 mg/dL (ref 0–149)
VLDL Cholesterol Cal: 27 mg/dL (ref 5–40)

## 2013-09-19 LAB — VITAMIN D 25 HYDROXY (VIT D DEFICIENCY, FRACTURES): Vit D, 25-Hydroxy: 24.4 ng/mL — ABNORMAL LOW (ref 30.0–100.0)

## 2013-09-19 LAB — URIC ACID: Uric Acid: 5.9 mg/dL (ref 2.5–7.1)

## 2013-09-21 ENCOUNTER — Encounter: Payer: Self-pay | Admitting: Family Medicine

## 2013-09-21 ENCOUNTER — Ambulatory Visit (INDEPENDENT_AMBULATORY_CARE_PROVIDER_SITE_OTHER): Payer: Medicare Other | Admitting: Family Medicine

## 2013-09-21 VITALS — BP 181/101 | HR 69 | Temp 97.1°F | Ht 62.0 in | Wt 193.8 lb

## 2013-09-21 DIAGNOSIS — R300 Dysuria: Secondary | ICD-10-CM

## 2013-09-21 DIAGNOSIS — I10 Essential (primary) hypertension: Secondary | ICD-10-CM | POA: Diagnosis not present

## 2013-09-21 DIAGNOSIS — E119 Type 2 diabetes mellitus without complications: Secondary | ICD-10-CM

## 2013-09-21 DIAGNOSIS — M109 Gout, unspecified: Secondary | ICD-10-CM

## 2013-09-21 DIAGNOSIS — J45909 Unspecified asthma, uncomplicated: Secondary | ICD-10-CM | POA: Diagnosis not present

## 2013-09-21 DIAGNOSIS — F4321 Adjustment disorder with depressed mood: Secondary | ICD-10-CM

## 2013-09-21 DIAGNOSIS — E669 Obesity, unspecified: Secondary | ICD-10-CM

## 2013-09-21 DIAGNOSIS — F329 Major depressive disorder, single episode, unspecified: Secondary | ICD-10-CM

## 2013-09-21 DIAGNOSIS — E7800 Pure hypercholesterolemia, unspecified: Secondary | ICD-10-CM

## 2013-09-21 DIAGNOSIS — G4700 Insomnia, unspecified: Secondary | ICD-10-CM

## 2013-09-21 DIAGNOSIS — E559 Vitamin D deficiency, unspecified: Secondary | ICD-10-CM

## 2013-09-21 DIAGNOSIS — J4530 Mild persistent asthma, uncomplicated: Secondary | ICD-10-CM

## 2013-09-21 LAB — POCT URINALYSIS DIPSTICK
Bilirubin, UA: NEGATIVE
Glucose, UA: NEGATIVE
Ketones, UA: NEGATIVE
Nitrite, UA: NEGATIVE
Protein, UA: NEGATIVE
Spec Grav, UA: 1.02
Urobilinogen, UA: NEGATIVE
pH, UA: 5

## 2013-09-21 LAB — POCT UA - MICROSCOPIC ONLY
Casts, Ur, LPF, POC: NEGATIVE
Crystals, Ur, HPF, POC: NEGATIVE
Yeast, UA: NEGATIVE

## 2013-09-21 MED ORDER — CLONAZEPAM 0.5 MG PO TABS: 0.5000 mg | ORAL_TABLET | Freq: Every evening | ORAL | Status: DC | PRN

## 2013-09-21 MED ORDER — ALBUTEROL SULFATE HFA 108 (90 BASE) MCG/ACT IN AERS: 2.0000 | INHALATION_SPRAY | Freq: Four times a day (QID) | RESPIRATORY_TRACT | Status: DC | PRN

## 2013-09-21 MED ORDER — CIPROFLOXACIN HCL 500 MG PO TABS: 500.0000 mg | ORAL_TABLET | Freq: Two times a day (BID) | ORAL | Status: DC

## 2013-09-21 NOTE — Telephone Encounter (Signed)
Here 09/18/13, all meds were ordered except these 2. Route to nurse pool so the clonazepam can be called into [facility] [phone]

## 2013-09-21 NOTE — Progress Notes (Signed)
Patient ID: Caitlin Harrington, female   DOB: 12/28/1945, 67 y.o.   MRN: [id number] SUBJECTIVE: CC: Chief Complaint  Patient presents with  . Follow-up    reck BP  . Urinary Tract Infection    HPI: Patient is here for follow up of hypertension/obesity/ DM/gout denies Headache;deniesChest Pain;denies weakness;denies Shortness of Breath or Orthopnea;denies Visual changes;denies palpitations;denies cough;denies pedal edema;denies symptoms of TIA or stroke; admits to Compliance with medications. denies Problems with medications.  Recheck BP since restarted back on her medications. No headache. Only new symptoms is dysuria. No fever, no back pain.  Needs a refill for her asthma inhaler since the cold weather is coming in. Intermittent use of inhaler maybe once per week.  Needs refill on the clonazepam for sleep and anxiety.  Past Medical History  Diagnosis Date  . Hypertension   . Obesity   . Asthma   . Vitamin D deficiency   . Diabetes mellitus without complication   . Gout   . Depression   . Insomnia    No past surgical history on file. History   Social History  . Marital Status: Single    Spouse Name: N/A    Number of Children: N/A  . Years of Education: N/A   Occupational History  . Not on file.   Social History Main Topics  . Smoking status: Never Smoker   . Smokeless tobacco: Not on file  . Alcohol Use: Not on file  . Drug Use: Not on file  . Sexual Activity: Not on file   Other Topics Concern  . Not on file   Social History Narrative  . No narrative on file   No family history on file. Current Outpatient Prescriptions on File Prior to Visit  Medication Sig Dispense Refill  . allopurinol (ZYLOPRIM) 100 MG tablet Take 1 tablet (100 mg total) by mouth daily.  30 tablet  6  . atorvastatin (LIPITOR) 10 MG tablet Take 1 tablet (10 mg total) by mouth daily.  30 tablet  5  . buPROPion (WELLBUTRIN) 100 MG tablet Take 1 tablet (100 mg total) by mouth 2 (two) times  daily.  60 tablet  5  . Cholecalciferol (VITAMIN D) 2000 UNITS CAPS Take 1 capsule by mouth daily.        . colchicine 0.6 MG tablet Take 1 tablet (0.6 mg total) by mouth 2 (two) times daily.  60 tablet  2  . FLUoxetine (PROZAC) 20 MG tablet Take 1 tablet (20 mg total) by mouth daily.  30 tablet  5  . glimepiride (AMARYL) 2 MG tablet Take 1 tablet (2 mg total) by mouth daily before breakfast.  30 tablet  5  . Olmesartan-Amlodipine-HCTZ (TRIBENZOR) 40-5-12.5 MG TABS Take 1 tablet by mouth daily.  30 tablet  5   No current facility-administered medications on file prior to visit.   Allergies  Allergen Reactions  . Meloxicam     There is no immunization history on file for this patient. Prior to Admission medications   Medication Sig Start Date End Date Taking? Authorizing Provider  albuterol (PROVENTIL HFA) 108 (90 BASE) MCG/ACT inhaler Inhale 2 puffs into the lungs every 6 (six) hours as needed.     Yes Historical Provider, MD  allopurinol (ZYLOPRIM) 100 MG tablet Take 1 tablet (100 mg total) by mouth daily. 09/18/13  Yes Ileana Ladd, MD  atorvastatin (LIPITOR) 10 MG tablet Take 1 tablet (10 mg total) by mouth daily. 09/18/13  Yes Ileana Ladd, MD  buPROPion (WELLBUTRIN) 100 MG tablet Take 1 tablet (100 mg total) by mouth 2 (two) times daily. 09/18/13  Yes Ileana Ladd, MD  Cholecalciferol (VITAMIN D) 2000 UNITS CAPS Take 1 capsule by mouth daily.     Yes Historical Provider, MD  clonazePAM (KLONOPIN) 0.5 MG tablet Take 0.5 mg by mouth at bedtime as needed.     Yes Historical Provider, MD  colchicine 0.6 MG tablet Take 1 tablet (0.6 mg total) by mouth 2 (two) times daily. 09/18/13  Yes Ileana Ladd, MD  FLUoxetine (PROZAC) 20 MG tablet Take 1 tablet (20 mg total) by mouth daily. 09/18/13  Yes Ileana Ladd, MD  glimepiride (AMARYL) 2 MG tablet Take 1 tablet (2 mg total) by mouth daily before breakfast. 09/18/13  Yes Ileana Ladd, MD  Olmesartan-Amlodipine-HCTZ [facility])  40-5-12.5 MG TABS Take 1 tablet by mouth daily. 09/18/13  Yes Ileana Ladd, MD     ROS: As above in the HPI. All other systems are stable or negative.  OBJECTIVE: APPEARANCE:  Patient in no acute distress.The patient appeared well nourished and normally developed. Acyanotic. Waist: VITAL SIGNS:BP 181/101  Pulse 69  Temp(Src) 97.1 F (36.2 C) (Oral)  Ht 5\' 2"  (1.575 m)  Wt 193 lb 12.8 oz (87.907 kg)  BMI 35.44 kg/m2  WF obese SKIN: warm and  Dry without overt rashes, tattoos and scars  HEAD and Neck: without JVD, Head and scalp: normal Eyes:No scleral icterus. Fundi normal, eye movements normal. Ears: Auricle normal, canal normal, Tympanic membranes normal, insufflation normal. Nose: normal Throat: normal Neck & thyroid: normal  CHEST & LUNGS: Chest wall: normal Lungs: Clear  CVS: Reveals the PMI to be normally located. Regular rhythm, First and Second Heart sounds are normal,  absence of murmurs, rubs or gallops. Peripheral vasculature: Radial pulses: normal Dorsal pedis pulses: normal Posterior pulses: normal  ABDOMEN:  Appearance: obesity Benign, no organomegaly, no masses, no Abdominal Aortic enlargement. No Guarding , no rebound. No Bruits. Bowel sounds: normal  RECTAL: N/A GU: N/A  EXTREMETIES: nonedematous.  MUSCULOSKELETAL:  Spine: normal Joints: intact  NEUROLOGIC: oriented to time,place and person; nonfocal. Strength is normal Sensory is normal Reflexes are normal Cranial Nerves are normal.  Results for orders placed in visit on 09/21/13  POCT URINALYSIS DIPSTICK      Result Value Range   Color, UA yellow     Clarity, UA clear     Glucose, UA neg     Bilirubin, UA neg     Ketones, UA neg     Spec Grav, UA 1.020     Blood, UA trace     pH, UA 5.0     Protein, UA neg     Urobilinogen, UA negative     Nitrite, UA neg     Leukocytes, UA Trace    POCT UA - MICROSCOPIC ONLY      Result Value Range   WBC, Ur, HPF, POC occ      RBC, urine, microscopic 10-20     Bacteria, U Microscopic few     Mucus, UA trace     Epithelial cells, urine per micros few     Crystals, Ur, HPF, POC neg     Casts, Ur, LPF, POC neg     Yeast, UA neg      ASSESSMENT: Asthma, mild persistent - Plan: albuterol (PROVENTIL HFA) 108 (90 BASE) MCG/ACT inhaler  DM (diabetes mellitus)  Gout  HTN (hypertension)  Hypercholesteremia  Insomnia - Plan:  clonazePAM (KLONOPIN) 0.5 MG tablet  Obesity  Prolonged depressive adjustment reaction  Vitamin D deficiency  Dysuria - Plan: POCT urinalysis dipstick, POCT UA - Microscopic Only, Urine culture, ciprofloxacin (CIPRO) 500 MG tablet  Bp responding to restarting BP medications.  PLAN:  Orders Placed This Encounter  Procedures  . Urine culture  . POCT urinalysis dipstick  . POCT UA - Microscopic Only   Meds ordered this encounter  Medications  . albuterol (PROVENTIL HFA) 108 (90 BASE) MCG/ACT inhaler    Sig: Inhale 2 puffs into the lungs every 6 (six) hours as needed.    Dispense:  1 Inhaler    Refill:  3  . clonazePAM (KLONOPIN) 0.5 MG tablet    Sig: Take 1 tablet (0.5 mg total) by mouth at bedtime as needed.    Dispense:  30 tablet    Refill:  1  . ciprofloxacin (CIPRO) 500 MG tablet    Sig: Take 1 tablet (500 mg total) by mouth 2 (two) times daily.    Dispense:  6 tablet    Refill:  0   Medications Discontinued During This Encounter  Medication Reason  . albuterol (PROVENTIL HFA) 108 (90 BASE) MCG/ACT inhaler Reorder  . clonazePAM (KLONOPIN) 0.5 MG tablet Reorder   Return in about 4 weeks (around 10/19/2013) for Recheck medical problems.  Recheck BP. Compliance with medications discussed  Temitope Flammer P. Modesto Charon, M.D.

## 2013-09-22 NOTE — Telephone Encounter (Signed)
Pt aware of appt 11/26

## 2013-09-23 LAB — URINE CULTURE

## 2013-09-23 NOTE — Telephone Encounter (Signed)
Was done at visit. Bobette Leyh P. Modesto Charon, M.D.

## 2013-09-24 NOTE — Progress Notes (Signed)
Quick Note:  Call patient. Labs normal. No change in plan. ______ 

## 2013-10-10 ENCOUNTER — Telehealth: Payer: Self-pay | Admitting: Family Medicine

## 2013-10-10 NOTE — Telephone Encounter (Signed)
Out of samples will check back with [place] at the end of the week per patient

## 2013-10-18 ENCOUNTER — Ambulatory Visit: Payer: Federal, State, Local not specified - PPO | Admitting: Family Medicine

## 2013-10-30 ENCOUNTER — Telehealth: Payer: Self-pay | Admitting: Family Medicine

## 2013-10-30 ENCOUNTER — Other Ambulatory Visit: Payer: Self-pay | Admitting: Family Medicine

## 2013-10-31 ENCOUNTER — Other Ambulatory Visit: Payer: Self-pay | Admitting: Family Medicine

## 2013-10-31 DIAGNOSIS — I10 Essential (primary) hypertension: Secondary | ICD-10-CM

## 2013-10-31 MED ORDER — LOSARTAN POTASSIUM-HCTZ 100-12.5 MG PO TABS: 1.0000 | ORAL_TABLET | Freq: Every day | ORAL | Status: DC

## 2013-10-31 MED ORDER — AMLODIPINE BESYLATE 5 MG PO TABS: 5.0000 mg | ORAL_TABLET | Freq: Every day | ORAL | Status: DC

## 2013-10-31 NOTE — Telephone Encounter (Signed)
Left message on pt phone that rx called to [facility]

## 2013-10-31 NOTE — Telephone Encounter (Signed)
Call patient : Prescription refilled & sent to pharmacy in [place]. Changed Rx.

## 2013-11-15 ENCOUNTER — Ambulatory Visit: Payer: Federal, State, Local not specified - PPO | Admitting: Family Medicine

## 2014-06-11 ENCOUNTER — Other Ambulatory Visit: Payer: Self-pay | Admitting: *Deleted

## 2014-06-11 DIAGNOSIS — E785 Hyperlipidemia, unspecified: Secondary | ICD-10-CM | POA: Diagnosis not present

## 2014-06-11 DIAGNOSIS — F329 Major depressive disorder, single episode, unspecified: Secondary | ICD-10-CM | POA: Diagnosis not present

## 2014-06-11 DIAGNOSIS — F3289 Other specified depressive episodes: Secondary | ICD-10-CM | POA: Diagnosis not present

## 2014-06-11 DIAGNOSIS — R946 Abnormal results of thyroid function studies: Secondary | ICD-10-CM | POA: Diagnosis not present

## 2014-06-11 DIAGNOSIS — M109 Gout, unspecified: Secondary | ICD-10-CM | POA: Diagnosis not present

## 2014-06-11 DIAGNOSIS — E119 Type 2 diabetes mellitus without complications: Secondary | ICD-10-CM | POA: Diagnosis not present

## 2014-06-11 DIAGNOSIS — I10 Essential (primary) hypertension: Secondary | ICD-10-CM | POA: Diagnosis not present

## 2014-06-11 MED ORDER — ALLOPURINOL 100 MG PO TABS: 100.0000 mg | ORAL_TABLET | Freq: Every day | ORAL | Status: DC

## 2014-07-13 DIAGNOSIS — E2839 Other primary ovarian failure: Secondary | ICD-10-CM | POA: Diagnosis not present

## 2014-07-13 DIAGNOSIS — F3289 Other specified depressive episodes: Secondary | ICD-10-CM | POA: Diagnosis not present

## 2014-07-13 DIAGNOSIS — R748 Abnormal levels of other serum enzymes: Secondary | ICD-10-CM | POA: Diagnosis not present

## 2014-07-13 DIAGNOSIS — E785 Hyperlipidemia, unspecified: Secondary | ICD-10-CM | POA: Diagnosis not present

## 2014-07-13 DIAGNOSIS — R7989 Other specified abnormal findings of blood chemistry: Secondary | ICD-10-CM | POA: Diagnosis not present

## 2014-07-13 DIAGNOSIS — F329 Major depressive disorder, single episode, unspecified: Secondary | ICD-10-CM | POA: Diagnosis not present

## 2014-07-13 DIAGNOSIS — I10 Essential (primary) hypertension: Secondary | ICD-10-CM | POA: Diagnosis not present

## 2014-07-19 DIAGNOSIS — Z1231 Encounter for screening mammogram for malignant neoplasm of breast: Secondary | ICD-10-CM | POA: Diagnosis not present

## 2014-08-07 DIAGNOSIS — H25019 Cortical age-related cataract, unspecified eye: Secondary | ICD-10-CM | POA: Diagnosis not present

## 2014-08-07 DIAGNOSIS — E119 Type 2 diabetes mellitus without complications: Secondary | ICD-10-CM | POA: Diagnosis not present

## 2014-08-07 DIAGNOSIS — H2510 Age-related nuclear cataract, unspecified eye: Secondary | ICD-10-CM | POA: Diagnosis not present

## 2014-09-13 DIAGNOSIS — E119 Type 2 diabetes mellitus without complications: Secondary | ICD-10-CM | POA: Diagnosis not present

## 2014-09-13 DIAGNOSIS — E785 Hyperlipidemia, unspecified: Secondary | ICD-10-CM | POA: Diagnosis not present

## 2014-09-13 DIAGNOSIS — M109 Gout, unspecified: Secondary | ICD-10-CM | POA: Diagnosis not present

## 2014-09-13 DIAGNOSIS — Z23 Encounter for immunization: Secondary | ICD-10-CM | POA: Diagnosis not present

## 2014-09-13 DIAGNOSIS — E559 Vitamin D deficiency, unspecified: Secondary | ICD-10-CM | POA: Diagnosis not present

## 2014-09-13 DIAGNOSIS — I10 Essential (primary) hypertension: Secondary | ICD-10-CM | POA: Diagnosis not present

## 2014-09-13 DIAGNOSIS — E669 Obesity, unspecified: Secondary | ICD-10-CM | POA: Diagnosis not present

## 2014-10-22 ENCOUNTER — Other Ambulatory Visit: Payer: Self-pay

## 2014-10-22 MED ORDER — ALBUTEROL SULFATE HFA 108 (90 BASE) MCG/ACT IN AERS: 2.0000 | INHALATION_SPRAY | Freq: Four times a day (QID) | RESPIRATORY_TRACT | Status: DC | PRN

## 2014-12-13 DIAGNOSIS — M109 Gout, unspecified: Secondary | ICD-10-CM | POA: Diagnosis not present

## 2014-12-13 DIAGNOSIS — I10 Essential (primary) hypertension: Secondary | ICD-10-CM | POA: Diagnosis not present

## 2014-12-13 DIAGNOSIS — E559 Vitamin D deficiency, unspecified: Secondary | ICD-10-CM | POA: Diagnosis not present

## 2014-12-13 DIAGNOSIS — R21 Rash and other nonspecific skin eruption: Secondary | ICD-10-CM | POA: Diagnosis not present

## 2014-12-13 DIAGNOSIS — E119 Type 2 diabetes mellitus without complications: Secondary | ICD-10-CM | POA: Diagnosis not present

## 2014-12-13 DIAGNOSIS — E785 Hyperlipidemia, unspecified: Secondary | ICD-10-CM | POA: Diagnosis not present

## 2014-12-13 DIAGNOSIS — F329 Major depressive disorder, single episode, unspecified: Secondary | ICD-10-CM | POA: Diagnosis not present

## 2014-12-13 DIAGNOSIS — S90122A Contusion of left lesser toe(s) without damage to nail, initial encounter: Secondary | ICD-10-CM | POA: Diagnosis not present

## 2014-12-13 DIAGNOSIS — E669 Obesity, unspecified: Secondary | ICD-10-CM | POA: Diagnosis not present

## 2015-01-28 DIAGNOSIS — R209 Unspecified disturbances of skin sensation: Secondary | ICD-10-CM | POA: Diagnosis not present

## 2015-01-28 DIAGNOSIS — E559 Vitamin D deficiency, unspecified: Secondary | ICD-10-CM | POA: Diagnosis not present

## 2015-01-28 DIAGNOSIS — E669 Obesity, unspecified: Secondary | ICD-10-CM | POA: Diagnosis not present

## 2015-01-28 DIAGNOSIS — R5383 Other fatigue: Secondary | ICD-10-CM | POA: Diagnosis not present

## 2015-01-28 DIAGNOSIS — E119 Type 2 diabetes mellitus without complications: Secondary | ICD-10-CM | POA: Diagnosis not present

## 2015-01-28 DIAGNOSIS — F329 Major depressive disorder, single episode, unspecified: Secondary | ICD-10-CM | POA: Diagnosis not present

## 2015-01-28 DIAGNOSIS — R202 Paresthesia of skin: Secondary | ICD-10-CM | POA: Diagnosis not present

## 2015-01-28 DIAGNOSIS — I10 Essential (primary) hypertension: Secondary | ICD-10-CM | POA: Diagnosis not present

## 2015-01-28 DIAGNOSIS — E785 Hyperlipidemia, unspecified: Secondary | ICD-10-CM | POA: Diagnosis not present

## 2015-01-28 DIAGNOSIS — M109 Gout, unspecified: Secondary | ICD-10-CM | POA: Diagnosis not present

## 2015-01-30 DIAGNOSIS — E669 Obesity, unspecified: Secondary | ICD-10-CM | POA: Diagnosis not present

## 2015-01-30 DIAGNOSIS — E119 Type 2 diabetes mellitus without complications: Secondary | ICD-10-CM | POA: Diagnosis not present

## 2015-01-30 DIAGNOSIS — R51 Headache: Secondary | ICD-10-CM | POA: Diagnosis not present

## 2015-01-30 DIAGNOSIS — R209 Unspecified disturbances of skin sensation: Secondary | ICD-10-CM | POA: Diagnosis not present

## 2015-01-30 DIAGNOSIS — I10 Essential (primary) hypertension: Secondary | ICD-10-CM | POA: Diagnosis not present

## 2015-01-30 DIAGNOSIS — M542 Cervicalgia: Secondary | ICD-10-CM | POA: Diagnosis not present

## 2015-01-30 DIAGNOSIS — E559 Vitamin D deficiency, unspecified: Secondary | ICD-10-CM | POA: Diagnosis not present

## 2015-01-30 DIAGNOSIS — R5383 Other fatigue: Secondary | ICD-10-CM | POA: Diagnosis not present

## 2015-01-30 DIAGNOSIS — E785 Hyperlipidemia, unspecified: Secondary | ICD-10-CM | POA: Diagnosis not present

## 2015-01-30 DIAGNOSIS — R202 Paresthesia of skin: Secondary | ICD-10-CM | POA: Diagnosis not present

## 2015-02-04 DIAGNOSIS — R21 Rash and other nonspecific skin eruption: Secondary | ICD-10-CM | POA: Diagnosis not present

## 2015-02-04 DIAGNOSIS — E559 Vitamin D deficiency, unspecified: Secondary | ICD-10-CM | POA: Diagnosis not present

## 2015-02-04 DIAGNOSIS — R899 Unspecified abnormal finding in specimens from other organs, systems and tissues: Secondary | ICD-10-CM | POA: Diagnosis not present

## 2015-02-04 DIAGNOSIS — R202 Paresthesia of skin: Secondary | ICD-10-CM | POA: Diagnosis not present

## 2015-02-04 DIAGNOSIS — R7989 Other specified abnormal findings of blood chemistry: Secondary | ICD-10-CM | POA: Diagnosis not present

## 2015-02-04 DIAGNOSIS — Z6836 Body mass index (BMI) 36.0-36.9, adult: Secondary | ICD-10-CM | POA: Diagnosis not present

## 2015-02-04 DIAGNOSIS — E669 Obesity, unspecified: Secondary | ICD-10-CM | POA: Diagnosis not present

## 2015-02-04 DIAGNOSIS — R51 Headache: Secondary | ICD-10-CM | POA: Diagnosis not present

## 2015-02-04 DIAGNOSIS — F329 Major depressive disorder, single episode, unspecified: Secondary | ICD-10-CM | POA: Diagnosis not present

## 2015-02-04 DIAGNOSIS — E785 Hyperlipidemia, unspecified: Secondary | ICD-10-CM | POA: Diagnosis not present

## 2015-02-04 DIAGNOSIS — M109 Gout, unspecified: Secondary | ICD-10-CM | POA: Diagnosis not present

## 2015-02-04 DIAGNOSIS — R300 Dysuria: Secondary | ICD-10-CM | POA: Diagnosis not present

## 2015-02-07 DIAGNOSIS — E785 Hyperlipidemia, unspecified: Secondary | ICD-10-CM | POA: Diagnosis not present

## 2015-02-07 DIAGNOSIS — T148 Other injury of unspecified body region: Secondary | ICD-10-CM | POA: Diagnosis not present

## 2015-02-07 DIAGNOSIS — R202 Paresthesia of skin: Secondary | ICD-10-CM | POA: Diagnosis not present

## 2015-02-07 DIAGNOSIS — R51 Headache: Secondary | ICD-10-CM | POA: Diagnosis not present

## 2015-02-07 DIAGNOSIS — R21 Rash and other nonspecific skin eruption: Secondary | ICD-10-CM | POA: Diagnosis not present

## 2015-02-07 DIAGNOSIS — T39395D Adverse effect of other nonsteroidal anti-inflammatory drugs [NSAID], subsequent encounter: Secondary | ICD-10-CM | POA: Diagnosis not present

## 2015-02-11 DIAGNOSIS — E119 Type 2 diabetes mellitus without complications: Secondary | ICD-10-CM | POA: Diagnosis not present

## 2015-02-11 DIAGNOSIS — T148 Other injury of unspecified body region: Secondary | ICD-10-CM | POA: Diagnosis not present

## 2015-02-11 DIAGNOSIS — E785 Hyperlipidemia, unspecified: Secondary | ICD-10-CM | POA: Diagnosis not present

## 2015-02-11 DIAGNOSIS — E669 Obesity, unspecified: Secondary | ICD-10-CM | POA: Diagnosis not present

## 2015-02-11 DIAGNOSIS — T50905A Adverse effect of unspecified drugs, medicaments and biological substances, initial encounter: Secondary | ICD-10-CM | POA: Diagnosis not present

## 2015-02-11 DIAGNOSIS — I10 Essential (primary) hypertension: Secondary | ICD-10-CM | POA: Diagnosis not present

## 2015-02-18 DIAGNOSIS — E559 Vitamin D deficiency, unspecified: Secondary | ICD-10-CM | POA: Diagnosis not present

## 2015-02-18 DIAGNOSIS — I10 Essential (primary) hypertension: Secondary | ICD-10-CM | POA: Diagnosis not present

## 2015-02-18 DIAGNOSIS — E119 Type 2 diabetes mellitus without complications: Secondary | ICD-10-CM | POA: Diagnosis not present

## 2015-02-18 DIAGNOSIS — E669 Obesity, unspecified: Secondary | ICD-10-CM | POA: Diagnosis not present

## 2015-02-18 DIAGNOSIS — R21 Rash and other nonspecific skin eruption: Secondary | ICD-10-CM | POA: Diagnosis not present

## 2015-02-18 DIAGNOSIS — N289 Disorder of kidney and ureter, unspecified: Secondary | ICD-10-CM | POA: Diagnosis not present

## 2015-02-18 DIAGNOSIS — M109 Gout, unspecified: Secondary | ICD-10-CM | POA: Diagnosis not present

## 2015-02-19 ENCOUNTER — Institutional Professional Consult (permissible substitution): Payer: Self-pay | Admitting: Interventional Cardiology

## 2015-03-18 DIAGNOSIS — E669 Obesity, unspecified: Secondary | ICD-10-CM | POA: Diagnosis not present

## 2015-03-18 DIAGNOSIS — E119 Type 2 diabetes mellitus without complications: Secondary | ICD-10-CM | POA: Diagnosis not present

## 2015-03-18 DIAGNOSIS — M109 Gout, unspecified: Secondary | ICD-10-CM | POA: Diagnosis not present

## 2015-03-18 DIAGNOSIS — E559 Vitamin D deficiency, unspecified: Secondary | ICD-10-CM | POA: Diagnosis not present

## 2015-03-18 DIAGNOSIS — R21 Rash and other nonspecific skin eruption: Secondary | ICD-10-CM | POA: Diagnosis not present

## 2015-03-18 DIAGNOSIS — W57XXXA Bitten or stung by nonvenomous insect and other nonvenomous arthropods, initial encounter: Secondary | ICD-10-CM | POA: Diagnosis not present

## 2015-03-18 DIAGNOSIS — I10 Essential (primary) hypertension: Secondary | ICD-10-CM | POA: Diagnosis not present

## 2015-07-19 DIAGNOSIS — F329 Major depressive disorder, single episode, unspecified: Secondary | ICD-10-CM | POA: Diagnosis not present

## 2015-07-19 DIAGNOSIS — E785 Hyperlipidemia, unspecified: Secondary | ICD-10-CM | POA: Diagnosis not present

## 2015-07-19 DIAGNOSIS — M109 Gout, unspecified: Secondary | ICD-10-CM | POA: Diagnosis not present

## 2015-07-19 DIAGNOSIS — E669 Obesity, unspecified: Secondary | ICD-10-CM | POA: Diagnosis not present

## 2015-07-19 DIAGNOSIS — I10 Essential (primary) hypertension: Secondary | ICD-10-CM | POA: Diagnosis not present

## 2015-07-19 DIAGNOSIS — E559 Vitamin D deficiency, unspecified: Secondary | ICD-10-CM | POA: Diagnosis not present

## 2015-07-19 DIAGNOSIS — E119 Type 2 diabetes mellitus without complications: Secondary | ICD-10-CM | POA: Diagnosis not present

## 2015-07-19 DIAGNOSIS — E039 Hypothyroidism, unspecified: Secondary | ICD-10-CM | POA: Diagnosis not present

## 2015-10-24 DIAGNOSIS — E039 Hypothyroidism, unspecified: Secondary | ICD-10-CM | POA: Diagnosis not present

## 2015-10-24 DIAGNOSIS — E119 Type 2 diabetes mellitus without complications: Secondary | ICD-10-CM | POA: Diagnosis not present

## 2015-10-24 DIAGNOSIS — E785 Hyperlipidemia, unspecified: Secondary | ICD-10-CM | POA: Diagnosis not present

## 2015-10-24 DIAGNOSIS — I10 Essential (primary) hypertension: Secondary | ICD-10-CM | POA: Diagnosis not present

## 2015-10-24 DIAGNOSIS — E669 Obesity, unspecified: Secondary | ICD-10-CM | POA: Diagnosis not present

## 2015-10-24 DIAGNOSIS — E559 Vitamin D deficiency, unspecified: Secondary | ICD-10-CM | POA: Diagnosis not present

## 2015-10-24 DIAGNOSIS — M109 Gout, unspecified: Secondary | ICD-10-CM | POA: Diagnosis not present

## 2015-10-24 DIAGNOSIS — G4700 Insomnia, unspecified: Secondary | ICD-10-CM | POA: Diagnosis not present

## 2015-12-05 DIAGNOSIS — Z1231 Encounter for screening mammogram for malignant neoplasm of breast: Secondary | ICD-10-CM | POA: Diagnosis not present

## 2015-12-27 DIAGNOSIS — E785 Hyperlipidemia, unspecified: Secondary | ICD-10-CM | POA: Diagnosis not present

## 2015-12-27 DIAGNOSIS — I10 Essential (primary) hypertension: Secondary | ICD-10-CM | POA: Diagnosis not present

## 2015-12-27 DIAGNOSIS — E559 Vitamin D deficiency, unspecified: Secondary | ICD-10-CM | POA: Diagnosis not present

## 2015-12-27 DIAGNOSIS — G4700 Insomnia, unspecified: Secondary | ICD-10-CM | POA: Diagnosis not present

## 2015-12-27 DIAGNOSIS — M109 Gout, unspecified: Secondary | ICD-10-CM | POA: Diagnosis not present

## 2015-12-27 DIAGNOSIS — E669 Obesity, unspecified: Secondary | ICD-10-CM | POA: Diagnosis not present

## 2015-12-27 DIAGNOSIS — E039 Hypothyroidism, unspecified: Secondary | ICD-10-CM | POA: Diagnosis not present

## 2015-12-27 DIAGNOSIS — E119 Type 2 diabetes mellitus without complications: Secondary | ICD-10-CM | POA: Diagnosis not present

## 2016-02-25 DIAGNOSIS — E119 Type 2 diabetes mellitus without complications: Secondary | ICD-10-CM | POA: Diagnosis not present

## 2016-02-25 DIAGNOSIS — I10 Essential (primary) hypertension: Secondary | ICD-10-CM | POA: Diagnosis not present

## 2016-02-25 DIAGNOSIS — M109 Gout, unspecified: Secondary | ICD-10-CM | POA: Diagnosis not present

## 2016-02-25 DIAGNOSIS — E039 Hypothyroidism, unspecified: Secondary | ICD-10-CM | POA: Diagnosis not present

## 2016-02-25 DIAGNOSIS — E559 Vitamin D deficiency, unspecified: Secondary | ICD-10-CM | POA: Diagnosis not present

## 2016-02-25 DIAGNOSIS — E785 Hyperlipidemia, unspecified: Secondary | ICD-10-CM | POA: Diagnosis not present

## 2016-02-25 DIAGNOSIS — E669 Obesity, unspecified: Secondary | ICD-10-CM | POA: Diagnosis not present

## 2016-06-26 DIAGNOSIS — Z6834 Body mass index (BMI) 34.0-34.9, adult: Secondary | ICD-10-CM | POA: Diagnosis not present

## 2016-06-26 DIAGNOSIS — E039 Hypothyroidism, unspecified: Secondary | ICD-10-CM | POA: Diagnosis not present

## 2016-06-26 DIAGNOSIS — E669 Obesity, unspecified: Secondary | ICD-10-CM | POA: Diagnosis not present

## 2016-06-26 DIAGNOSIS — E785 Hyperlipidemia, unspecified: Secondary | ICD-10-CM | POA: Diagnosis not present

## 2016-06-26 DIAGNOSIS — M109 Gout, unspecified: Secondary | ICD-10-CM | POA: Diagnosis not present

## 2016-06-26 DIAGNOSIS — E559 Vitamin D deficiency, unspecified: Secondary | ICD-10-CM | POA: Diagnosis not present

## 2016-06-26 DIAGNOSIS — E119 Type 2 diabetes mellitus without complications: Secondary | ICD-10-CM | POA: Diagnosis not present

## 2016-06-26 DIAGNOSIS — I10 Essential (primary) hypertension: Secondary | ICD-10-CM | POA: Diagnosis not present

## 2016-06-27 ENCOUNTER — Other Ambulatory Visit: Payer: Self-pay | Admitting: Family Medicine

## 2016-09-29 DIAGNOSIS — G4700 Insomnia, unspecified: Secondary | ICD-10-CM | POA: Diagnosis not present

## 2016-09-29 DIAGNOSIS — M109 Gout, unspecified: Secondary | ICD-10-CM | POA: Diagnosis not present

## 2016-09-29 DIAGNOSIS — E785 Hyperlipidemia, unspecified: Secondary | ICD-10-CM | POA: Diagnosis not present

## 2016-09-29 DIAGNOSIS — E669 Obesity, unspecified: Secondary | ICD-10-CM | POA: Diagnosis not present

## 2016-09-29 DIAGNOSIS — S90851S Superficial foreign body, right foot, sequela: Secondary | ICD-10-CM | POA: Diagnosis not present

## 2016-09-29 DIAGNOSIS — I10 Essential (primary) hypertension: Secondary | ICD-10-CM | POA: Diagnosis not present

## 2016-09-29 DIAGNOSIS — J00 Acute nasopharyngitis [common cold]: Secondary | ICD-10-CM | POA: Diagnosis not present

## 2016-09-29 DIAGNOSIS — Z6836 Body mass index (BMI) 36.0-36.9, adult: Secondary | ICD-10-CM | POA: Diagnosis not present

## 2016-09-29 DIAGNOSIS — E039 Hypothyroidism, unspecified: Secondary | ICD-10-CM | POA: Diagnosis not present

## 2016-09-29 DIAGNOSIS — E119 Type 2 diabetes mellitus without complications: Secondary | ICD-10-CM | POA: Diagnosis not present

## 2016-09-29 DIAGNOSIS — Z1211 Encounter for screening for malignant neoplasm of colon: Secondary | ICD-10-CM | POA: Diagnosis not present

## 2016-09-29 DIAGNOSIS — F329 Major depressive disorder, single episode, unspecified: Secondary | ICD-10-CM | POA: Diagnosis not present

## 2016-10-13 DIAGNOSIS — Z1211 Encounter for screening for malignant neoplasm of colon: Secondary | ICD-10-CM | POA: Diagnosis not present

## 2016-10-13 DIAGNOSIS — E669 Obesity, unspecified: Secondary | ICD-10-CM | POA: Diagnosis not present

## 2016-10-13 DIAGNOSIS — F329 Major depressive disorder, single episode, unspecified: Secondary | ICD-10-CM | POA: Diagnosis not present

## 2016-10-13 DIAGNOSIS — M109 Gout, unspecified: Secondary | ICD-10-CM | POA: Diagnosis not present

## 2016-10-13 DIAGNOSIS — E039 Hypothyroidism, unspecified: Secondary | ICD-10-CM | POA: Diagnosis not present

## 2016-10-13 DIAGNOSIS — I10 Essential (primary) hypertension: Secondary | ICD-10-CM | POA: Diagnosis not present

## 2016-10-13 DIAGNOSIS — G4700 Insomnia, unspecified: Secondary | ICD-10-CM | POA: Diagnosis not present

## 2016-10-13 DIAGNOSIS — E119 Type 2 diabetes mellitus without complications: Secondary | ICD-10-CM | POA: Diagnosis not present

## 2016-10-13 DIAGNOSIS — E785 Hyperlipidemia, unspecified: Secondary | ICD-10-CM | POA: Diagnosis not present

## 2016-10-14 DIAGNOSIS — Z1211 Encounter for screening for malignant neoplasm of colon: Secondary | ICD-10-CM | POA: Diagnosis not present

## 2016-10-14 DIAGNOSIS — F329 Major depressive disorder, single episode, unspecified: Secondary | ICD-10-CM | POA: Diagnosis not present

## 2016-10-14 DIAGNOSIS — I10 Essential (primary) hypertension: Secondary | ICD-10-CM | POA: Diagnosis not present

## 2016-10-14 DIAGNOSIS — E039 Hypothyroidism, unspecified: Secondary | ICD-10-CM | POA: Diagnosis not present

## 2016-10-14 DIAGNOSIS — E785 Hyperlipidemia, unspecified: Secondary | ICD-10-CM | POA: Diagnosis not present

## 2016-10-14 DIAGNOSIS — M109 Gout, unspecified: Secondary | ICD-10-CM | POA: Diagnosis not present

## 2016-10-14 DIAGNOSIS — E669 Obesity, unspecified: Secondary | ICD-10-CM | POA: Diagnosis not present

## 2016-10-14 DIAGNOSIS — G4700 Insomnia, unspecified: Secondary | ICD-10-CM | POA: Diagnosis not present

## 2016-10-14 DIAGNOSIS — E119 Type 2 diabetes mellitus without complications: Secondary | ICD-10-CM | POA: Diagnosis not present

## 2016-10-29 DIAGNOSIS — Z6836 Body mass index (BMI) 36.0-36.9, adult: Secondary | ICD-10-CM | POA: Diagnosis not present

## 2016-10-29 DIAGNOSIS — E669 Obesity, unspecified: Secondary | ICD-10-CM | POA: Diagnosis not present

## 2016-10-29 DIAGNOSIS — I10 Essential (primary) hypertension: Secondary | ICD-10-CM | POA: Diagnosis not present

## 2016-10-29 DIAGNOSIS — M109 Gout, unspecified: Secondary | ICD-10-CM | POA: Diagnosis not present

## 2016-10-29 DIAGNOSIS — E785 Hyperlipidemia, unspecified: Secondary | ICD-10-CM | POA: Diagnosis not present

## 2016-10-29 DIAGNOSIS — E039 Hypothyroidism, unspecified: Secondary | ICD-10-CM | POA: Diagnosis not present

## 2016-10-29 DIAGNOSIS — F329 Major depressive disorder, single episode, unspecified: Secondary | ICD-10-CM | POA: Diagnosis not present

## 2016-10-29 DIAGNOSIS — G4700 Insomnia, unspecified: Secondary | ICD-10-CM | POA: Diagnosis not present

## 2016-11-05 DIAGNOSIS — M109 Gout, unspecified: Secondary | ICD-10-CM | POA: Diagnosis not present

## 2016-11-05 DIAGNOSIS — Z23 Encounter for immunization: Secondary | ICD-10-CM | POA: Diagnosis not present

## 2016-11-05 DIAGNOSIS — F329 Major depressive disorder, single episode, unspecified: Secondary | ICD-10-CM | POA: Diagnosis not present

## 2016-11-05 DIAGNOSIS — E559 Vitamin D deficiency, unspecified: Secondary | ICD-10-CM | POA: Diagnosis not present

## 2016-11-05 DIAGNOSIS — E119 Type 2 diabetes mellitus without complications: Secondary | ICD-10-CM | POA: Diagnosis not present

## 2016-11-05 DIAGNOSIS — Z6836 Body mass index (BMI) 36.0-36.9, adult: Secondary | ICD-10-CM | POA: Diagnosis not present

## 2016-11-05 DIAGNOSIS — E669 Obesity, unspecified: Secondary | ICD-10-CM | POA: Diagnosis not present

## 2016-11-05 DIAGNOSIS — I10 Essential (primary) hypertension: Secondary | ICD-10-CM | POA: Diagnosis not present

## 2016-11-06 ENCOUNTER — Emergency Department (HOSPITAL_COMMUNITY)
Admission: EM | Admit: 2016-11-06 | Discharge: 2016-11-06 | Disposition: A | Discharge status: Home / Self Care | Payer: Medicare Other | Attending: Emergency Medicine | Admitting: Emergency Medicine

## 2016-11-06 ENCOUNTER — Emergency Department (HOSPITAL_COMMUNITY): Payer: Medicare Other

## 2016-11-06 ENCOUNTER — Encounter (HOSPITAL_COMMUNITY): Payer: Self-pay | Admitting: Emergency Medicine

## 2016-11-06 DIAGNOSIS — Z79899 Other long term (current) drug therapy: Secondary | ICD-10-CM | POA: Insufficient documentation

## 2016-11-06 DIAGNOSIS — J45909 Unspecified asthma, uncomplicated: Secondary | ICD-10-CM | POA: Insufficient documentation

## 2016-11-06 DIAGNOSIS — G459 Transient cerebral ischemic attack, unspecified: Secondary | ICD-10-CM | POA: Diagnosis present

## 2016-11-06 DIAGNOSIS — H538 Other visual disturbances: Secondary | ICD-10-CM

## 2016-11-06 DIAGNOSIS — I10 Essential (primary) hypertension: Secondary | ICD-10-CM | POA: Diagnosis not present

## 2016-11-06 DIAGNOSIS — H539 Unspecified visual disturbance: Secondary | ICD-10-CM | POA: Diagnosis not present

## 2016-11-06 DIAGNOSIS — H5319 Other subjective visual disturbances: Secondary | ICD-10-CM | POA: Diagnosis not present

## 2016-11-06 DIAGNOSIS — E119 Type 2 diabetes mellitus without complications: Secondary | ICD-10-CM | POA: Diagnosis not present

## 2016-11-06 DIAGNOSIS — R443 Hallucinations, unspecified: Secondary | ICD-10-CM | POA: Diagnosis not present

## 2016-11-06 LAB — CBC WITH DIFFERENTIAL/PLATELET
BASOS PCT: 0 %
Basophils Absolute: 0 10*3/uL (ref 0.0–0.1)
Eosinophils Absolute: 0.4 10*3/uL (ref 0.0–0.7)
Eosinophils Relative: 5 %
HEMATOCRIT: 41 % (ref 36.0–46.0)
HEMOGLOBIN: 13.8 g/dL (ref 12.0–15.0)
LYMPHS ABS: 3 10*3/uL (ref 0.7–4.0)
Lymphocytes Relative: 33 %
MCH: 29.7 pg (ref 26.0–34.0)
MCHC: 33.7 g/dL (ref 30.0–36.0)
MCV: 88.4 fL (ref 78.0–100.0)
MONO ABS: 0.9 10*3/uL (ref 0.1–1.0)
MONOS PCT: 10 %
NEUTROS ABS: 4.6 10*3/uL (ref 1.7–7.7)
NEUTROS PCT: 52 %
Platelets: 253 10*3/uL (ref 150–400)
RBC: 4.64 MIL/uL (ref 3.87–5.11)
RDW: 13.2 % (ref 11.5–15.5)
WBC: 8.9 10*3/uL (ref 4.0–10.5)

## 2016-11-06 LAB — COMPREHENSIVE METABOLIC PANEL
ALBUMIN: 3.8 g/dL (ref 3.5–5.0)
ALK PHOS: 112 U/L (ref 38–126)
ALT: 25 U/L (ref 14–54)
ANION GAP: 7 (ref 5–15)
AST: 27 U/L (ref 15–41)
BILIRUBIN TOTAL: 0.6 mg/dL (ref 0.3–1.2)
BUN: 18 mg/dL (ref 6–20)
CALCIUM: 9.4 mg/dL (ref 8.9–10.3)
CO2: 30 mmol/L (ref 22–32)
Chloride: 98 mmol/L — ABNORMAL LOW (ref 101–111)
Creatinine, Ser: 0.94 mg/dL (ref 0.44–1.00)
Glucose, Bld: 120 mg/dL — ABNORMAL HIGH (ref 65–99)
POTASSIUM: 3.7 mmol/L (ref 3.5–5.1)
Sodium: 135 mmol/L (ref 135–145)
TOTAL PROTEIN: 7.5 g/dL (ref 6.5–8.1)

## 2016-11-06 NOTE — Discharge Instructions (Signed)

## 2016-11-06 NOTE — ED Notes (Signed)
PT at the desk asking for d/c papers.

## 2016-11-06 NOTE — ED Provider Notes (Signed)
[facility]PT Provider Note   CSN: [id number] Arrival date & time: 11/06/16  1440     History   Chief Complaint Chief Complaint  Patient presents with  . Transient Ischemic Attack    Per Dr Modesto CharonWong    HPI Caitlin Harrington is a 70 y.o. female.  HPI  The patient is a 70 year old female with a history of diabetes, paresthesias, abdominal hysterectomy, high cholesterol. She reports that she has a history of a mini stroke 15 years ago where she saw flashing lights, at 3:00 in the morning she was awakened by a bright flashing light in her bilateral vision which lasted for several seconds and then went away. She denied headache numbness weakness changes in speech or gait or ability to talk breathe or chew or swallow. She has not had recurrence of these flashing lights but she emailed her neurologist this morning who told her to come to the emergency department for evaluation. The patient does not have any symptoms whatsoever at this time, she does report that she has severe hypertension and noted that yesterday her blood pressure was 200/120. She has been started on new medications and today it is 180/84. Again at this time she has a symptomatically.  Past Medical History:  Diagnosis Date  . Asthma   . Depression   . Diabetes mellitus without complication (HCC)   . Fatigue   . Gout   . Hypertension   . Insomnia   . Numbness   . Obesity   . Paresthesia   . Vitamin D deficiency     Patient Active Problem List   Diagnosis Date Noted  . Gout   . HTN (hypertension) 02/18/2011  . Prolonged depressive adjustment reaction 02/18/2011  . Obesity 02/18/2011  . Asthma, mild persistent 02/18/2011  . Vitamin D deficiency 02/18/2011  . Hypercholesteremia 02/18/2011  . DM (diabetes mellitus) (HCC) 02/18/2011  . Insomnia 02/18/2011  . Stress at work 02/18/2011    Past Surgical History:  Procedure Laterality Date  . ABDOMINAL HYSTERECTOMY      OB History    No data available        Home Medications    Prior to Admission medications   Medication Sig Start Date End Date Taking? Authorizing Provider  albuterol (PROVENTIL HFA) 108 (90 BASE) MCG/ACT inhaler Inhale 2 puffs into the lungs every 6 (six) hours as needed. 10/22/14  Yes Ernestina Pennaonald W Moore, MD  allopurinol (ZYLOPRIM) 100 MG tablet Take 1 tablet (100 mg total) by mouth daily. 06/11/14  Yes Ernestina Pennaonald W Moore, MD  buPROPion (WELLBUTRIN) 100 MG tablet Take 1 tablet (100 mg total) by mouth 2 (two) times daily. Patient taking differently: Take 100 mg by mouth daily.  09/18/13  Yes Ileana LaddFrancis P Wong, MD  Cholecalciferol (VITAMIN D) 2000 UNITS CAPS Take 1 capsule by mouth daily.     Yes Historical Provider, MD  colesevelam (WELCHOL) 625 MG tablet Take 625 mg by mouth daily.   Yes Historical Provider, MD  FLUoxetine (PROZAC) 20 MG tablet Take 1 tablet (20 mg total) by mouth daily. 09/18/13  Yes Ileana LaddFrancis P Wong, MD  fluticasone (FLONASE) 50 MCG/ACT nasal spray Place 1 spray into both nostrils daily as needed for allergies or rhinitis.  09/29/16  Yes Historical Provider, MD  losartan-hydrochlorothiazide (HYZAAR) 100-25 MG tablet Take 1 tablet by mouth daily. 10/29/16  Yes Historical Provider, MD  terazosin (HYTRIN) 2 MG capsule Take 4 mg by mouth at bedtime. 10/23/16  Yes Historical Provider, MD  zolpidem Remus Loffler(AMBIEN)  5 MG tablet Take 5 mg by mouth at bedtime. 10/29/16  Yes Historical Provider, MD  amLODipine (NORVASC) 5 MG tablet Take 5 mg by mouth daily.  11/06/16   Historical Provider, MD    Family History No family history on file.  Social History Social History  Substance Use Topics  . Smoking status: Never Smoker  . Smokeless tobacco: Never Used  . Alcohol use Not on file     Allergies   Motrin [ibuprofen]; Atorvastatin; Meloxicam; and Statins   Review of Systems Review of Systems  All other systems reviewed and are negative.    Physical Exam Updated Vital Signs BP (!) 157/108 (BP Location: Left Arm)   Pulse 80    Temp 97.9 F (36.6 C) (Oral)   Resp 20   Ht 5' (1.524 m)   Wt 188 lb (85.3 kg)   SpO2 99%   BMI 36.72 kg/m   Physical Exam  Constitutional: She appears well-developed and well-nourished. No distress.  HENT:  Head: Normocephalic and atraumatic.  Mouth/Throat: Oropharynx is clear and moist. No oropharyngeal exudate.  Eyes: Conjunctivae and EOM are normal. Pupils are equal, round, and reactive to light. Right eye exhibits no discharge. Left eye exhibits no discharge. No scleral icterus.  Ophthalmoscope exam with no signs of retinal detachment, gross visual acuity is totally normal, extraocular movements and pupillary exam is normal, no diplopia, no blurred vision  Neck: Normal range of motion. Neck supple. No JVD present. No thyromegaly present.  Cardiovascular: Normal rate, regular rhythm, normal heart sounds and intact distal pulses.  Exam reveals no gallop and no friction rub.   No murmur heard. Pulmonary/Chest: Effort normal and breath sounds normal. No respiratory distress. She has no wheezes. She has no rales.  Abdominal: Soft. Bowel sounds are normal. She exhibits no distension and no mass. There is no tenderness.  Musculoskeletal: Normal range of motion. She exhibits no edema or tenderness.  Lymphadenopathy:    She has no cervical adenopathy.  Neurological: She is alert. Coordination normal.  Neurologic exam:  Speech clear, pupils equal round reactive to light, extraocular movements intact  Normal peripheral visual fields Cranial nerves III through XII normal including no facial droop Follows commands, moves all extremities x4, normal strength to bilateral upper and lower extremities at all major muscle groups including grip Sensation normal to light touch and pinprick Coordination intact, no limb ataxia, finger-nose-finger normal Rapid alternating movements normal No pronator drift Gait normal   Skin: Skin is warm and dry. No rash noted. No erythema.  Psychiatric: She  has a normal mood and affect. Her behavior is normal.  Nursing note and vitals reviewed.    ED Treatments / Results  Labs (all labs ordered are listed, but only abnormal results are displayed) Labs Reviewed  COMPREHENSIVE METABOLIC PANEL - Abnormal; Notable for the following:       Result Value   Chloride 98 (*)    Glucose, Bld 120 (*)    All other components within normal limits  CBC WITH DIFFERENTIAL/PLATELET    EKG  EKG Interpretation  Date/Time:  Friday November 06 2016 16:23:02 EST Ventricular Rate:  65 PR Interval:    QRS Duration: 88 QT Interval:  402 QTC Calculation: 418 R Axis:   -19 Text Interpretation:  Sinus rhythm Inferior infarct, old Consider anterior infarct Poor R wave progression Abnormal ekg Confirmed by Hyacinth Meeker  MD, Kennedi Lizardo ([id number]) on 11/06/2016 4:36:36 PM       Radiology Ct Head Wo Contrast  Result  Date: 11/06/2016 CLINICAL DATA:  Visual hallucinations EXAM: CT HEAD WITHOUT CONTRAST TECHNIQUE: Contiguous axial images were obtained from the base of the skull through the vertex without intravenous contrast. COMPARISON:  01/30/2015 FINDINGS: Brain: Mild atrophic changes and chronic white matter ischemic change is seen. No findings to suggest acute hemorrhage, acute infarction or space-occupying mass lesion are noted. Vascular: No hyperdense vessel or unexpected calcification. Skull: Normal. Negative for fracture or focal lesion. Sinuses/Orbits: No acute finding. Other: None. IMPRESSION: Atrophic and ischemic changes without acute abnormality. Electronically Signed   By: Alcide CleverMark  Lukens M.D.   On: 11/06/2016 16:39    Procedures Procedures (including critical care time)  Medications Ordered in ED Medications - No data to display   Initial Impression / Assessment and Plan / ED Course  I have reviewed the triage vital signs and the nursing notes.  Pertinent labs & imaging results that were available during my care of the patient were reviewed by me and  considered in my medical decision making (see chart for details).  Clinical Course     The patient's exam is unremarkable overall, her symptoms are unremarkable, her blood pressure is elevated but improving over time, the patient is expecting to have a CT scan from the neurologist stating that she needed a stroke workup however at this time and historically over the last 12 hours she has had no focal neurologic deficits, only the flashing light which lasted several seconds. She cannot even remember which eye she saw them but thinks it might of been both.  CT and labs unremarkable - stable appaering for d/c. Pt informed and ina greement.  Final Clinical Impressions(s) / ED Diagnoses   Final diagnoses:  Flashing lights seen    New Prescriptions New Prescriptions   No medications on file     Eber HongBrian Mickey Esguerra, MD 11/06/16 1740

## 2016-11-06 NOTE — ED Triage Notes (Signed)
Was awakened but bright flasking light this am (0245). And pt when back to sleep.  Pt says this has happened in the past.  Denies any other episodes.  Pt notified Dr Modesto CharonWong and was told to come to ED for evaluation.

## 2016-11-13 DIAGNOSIS — I10 Essential (primary) hypertension: Secondary | ICD-10-CM | POA: Diagnosis not present

## 2016-11-13 DIAGNOSIS — E669 Obesity, unspecified: Secondary | ICD-10-CM | POA: Diagnosis not present

## 2016-11-13 DIAGNOSIS — Z6836 Body mass index (BMI) 36.0-36.9, adult: Secondary | ICD-10-CM | POA: Diagnosis not present

## 2016-11-13 DIAGNOSIS — E559 Vitamin D deficiency, unspecified: Secondary | ICD-10-CM | POA: Diagnosis not present

## 2016-11-13 DIAGNOSIS — E119 Type 2 diabetes mellitus without complications: Secondary | ICD-10-CM | POA: Diagnosis not present

## 2016-11-23 HISTORY — PX: OTHER SURGICAL HISTORY: SHX169

## 2016-12-15 DIAGNOSIS — E119 Type 2 diabetes mellitus without complications: Secondary | ICD-10-CM | POA: Diagnosis not present

## 2016-12-15 DIAGNOSIS — E039 Hypothyroidism, unspecified: Secondary | ICD-10-CM | POA: Diagnosis not present

## 2016-12-15 DIAGNOSIS — I10 Essential (primary) hypertension: Secondary | ICD-10-CM | POA: Diagnosis not present

## 2016-12-15 DIAGNOSIS — E559 Vitamin D deficiency, unspecified: Secondary | ICD-10-CM | POA: Diagnosis not present

## 2016-12-15 DIAGNOSIS — R5383 Other fatigue: Secondary | ICD-10-CM | POA: Diagnosis not present

## 2016-12-15 DIAGNOSIS — E785 Hyperlipidemia, unspecified: Secondary | ICD-10-CM | POA: Diagnosis not present

## 2017-02-18 DIAGNOSIS — G4700 Insomnia, unspecified: Secondary | ICD-10-CM | POA: Diagnosis not present

## 2017-02-18 DIAGNOSIS — R5383 Other fatigue: Secondary | ICD-10-CM | POA: Diagnosis not present

## 2017-02-18 DIAGNOSIS — I10 Essential (primary) hypertension: Secondary | ICD-10-CM | POA: Diagnosis not present

## 2017-02-18 DIAGNOSIS — E039 Hypothyroidism, unspecified: Secondary | ICD-10-CM | POA: Diagnosis not present

## 2017-02-18 DIAGNOSIS — M109 Gout, unspecified: Secondary | ICD-10-CM | POA: Diagnosis not present

## 2017-02-18 DIAGNOSIS — E119 Type 2 diabetes mellitus without complications: Secondary | ICD-10-CM | POA: Diagnosis not present

## 2017-02-18 DIAGNOSIS — R21 Rash and other nonspecific skin eruption: Secondary | ICD-10-CM | POA: Diagnosis not present

## 2017-02-18 DIAGNOSIS — E785 Hyperlipidemia, unspecified: Secondary | ICD-10-CM | POA: Diagnosis not present

## 2017-02-18 DIAGNOSIS — E559 Vitamin D deficiency, unspecified: Secondary | ICD-10-CM | POA: Diagnosis not present

## 2017-03-16 DIAGNOSIS — E119 Type 2 diabetes mellitus without complications: Secondary | ICD-10-CM | POA: Diagnosis not present

## 2017-03-16 DIAGNOSIS — H04123 Dry eye syndrome of bilateral lacrimal glands: Secondary | ICD-10-CM | POA: Diagnosis not present

## 2017-03-16 DIAGNOSIS — H2513 Age-related nuclear cataract, bilateral: Secondary | ICD-10-CM | POA: Diagnosis not present

## 2017-04-20 DIAGNOSIS — H2513 Age-related nuclear cataract, bilateral: Secondary | ICD-10-CM | POA: Diagnosis not present

## 2017-05-12 DIAGNOSIS — H25043 Posterior subcapsular polar age-related cataract, bilateral: Secondary | ICD-10-CM | POA: Diagnosis not present

## 2017-05-12 DIAGNOSIS — H2513 Age-related nuclear cataract, bilateral: Secondary | ICD-10-CM | POA: Diagnosis not present

## 2017-05-12 DIAGNOSIS — H5202 Hypermetropia, left eye: Secondary | ICD-10-CM | POA: Diagnosis not present

## 2017-06-03 DIAGNOSIS — E039 Hypothyroidism, unspecified: Secondary | ICD-10-CM | POA: Diagnosis not present

## 2017-06-03 DIAGNOSIS — F329 Major depressive disorder, single episode, unspecified: Secondary | ICD-10-CM | POA: Diagnosis not present

## 2017-06-03 DIAGNOSIS — I10 Essential (primary) hypertension: Secondary | ICD-10-CM | POA: Diagnosis not present

## 2017-06-03 DIAGNOSIS — M109 Gout, unspecified: Secondary | ICD-10-CM | POA: Diagnosis not present

## 2017-06-03 DIAGNOSIS — E119 Type 2 diabetes mellitus without complications: Secondary | ICD-10-CM | POA: Diagnosis not present

## 2017-06-03 DIAGNOSIS — E559 Vitamin D deficiency, unspecified: Secondary | ICD-10-CM | POA: Diagnosis not present

## 2017-06-03 DIAGNOSIS — Z9119 Patient's noncompliance with other medical treatment and regimen: Secondary | ICD-10-CM | POA: Diagnosis not present

## 2017-06-03 DIAGNOSIS — E785 Hyperlipidemia, unspecified: Secondary | ICD-10-CM | POA: Diagnosis not present

## 2017-06-15 DIAGNOSIS — H25011 Cortical age-related cataract, right eye: Secondary | ICD-10-CM | POA: Diagnosis not present

## 2017-06-15 DIAGNOSIS — H25041 Posterior subcapsular polar age-related cataract, right eye: Secondary | ICD-10-CM | POA: Diagnosis not present

## 2017-06-15 DIAGNOSIS — H2511 Age-related nuclear cataract, right eye: Secondary | ICD-10-CM | POA: Diagnosis not present

## 2017-06-15 DIAGNOSIS — H25811 Combined forms of age-related cataract, right eye: Secondary | ICD-10-CM | POA: Diagnosis not present

## 2017-06-29 DIAGNOSIS — H25042 Posterior subcapsular polar age-related cataract, left eye: Secondary | ICD-10-CM | POA: Diagnosis not present

## 2017-06-29 DIAGNOSIS — H25012 Cortical age-related cataract, left eye: Secondary | ICD-10-CM | POA: Diagnosis not present

## 2017-06-29 DIAGNOSIS — H25812 Combined forms of age-related cataract, left eye: Secondary | ICD-10-CM | POA: Diagnosis not present

## 2017-06-29 DIAGNOSIS — H2512 Age-related nuclear cataract, left eye: Secondary | ICD-10-CM | POA: Diagnosis not present

## 2017-08-17 DIAGNOSIS — E119 Type 2 diabetes mellitus without complications: Secondary | ICD-10-CM | POA: Diagnosis not present

## 2017-08-17 DIAGNOSIS — I10 Essential (primary) hypertension: Secondary | ICD-10-CM | POA: Diagnosis not present

## 2017-08-17 DIAGNOSIS — E039 Hypothyroidism, unspecified: Secondary | ICD-10-CM | POA: Diagnosis not present

## 2017-08-17 DIAGNOSIS — Z7984 Long term (current) use of oral hypoglycemic drugs: Secondary | ICD-10-CM | POA: Diagnosis not present

## 2017-08-17 DIAGNOSIS — F329 Major depressive disorder, single episode, unspecified: Secondary | ICD-10-CM | POA: Diagnosis not present

## 2017-08-17 DIAGNOSIS — E559 Vitamin D deficiency, unspecified: Secondary | ICD-10-CM | POA: Diagnosis not present

## 2017-08-17 DIAGNOSIS — E785 Hyperlipidemia, unspecified: Secondary | ICD-10-CM | POA: Diagnosis not present

## 2017-08-17 DIAGNOSIS — Z1211 Encounter for screening for malignant neoplasm of colon: Secondary | ICD-10-CM | POA: Diagnosis not present

## 2017-10-26 DIAGNOSIS — L308 Other specified dermatitis: Secondary | ICD-10-CM | POA: Diagnosis not present

## 2017-10-26 DIAGNOSIS — E785 Hyperlipidemia, unspecified: Secondary | ICD-10-CM | POA: Diagnosis not present

## 2017-10-26 DIAGNOSIS — E559 Vitamin D deficiency, unspecified: Secondary | ICD-10-CM | POA: Diagnosis not present

## 2017-10-26 DIAGNOSIS — E1165 Type 2 diabetes mellitus with hyperglycemia: Secondary | ICD-10-CM | POA: Diagnosis not present

## 2017-10-26 DIAGNOSIS — Z9119 Patient's noncompliance with other medical treatment and regimen: Secondary | ICD-10-CM | POA: Diagnosis not present

## 2017-10-26 DIAGNOSIS — E119 Type 2 diabetes mellitus without complications: Secondary | ICD-10-CM | POA: Diagnosis not present

## 2017-10-26 DIAGNOSIS — E039 Hypothyroidism, unspecified: Secondary | ICD-10-CM | POA: Diagnosis not present

## 2017-10-26 DIAGNOSIS — I10 Essential (primary) hypertension: Secondary | ICD-10-CM | POA: Diagnosis not present

## 2017-10-26 DIAGNOSIS — Z7984 Long term (current) use of oral hypoglycemic drugs: Secondary | ICD-10-CM | POA: Diagnosis not present

## 2017-10-26 DIAGNOSIS — E790 Hyperuricemia without signs of inflammatory arthritis and tophaceous disease: Secondary | ICD-10-CM | POA: Diagnosis not present

## 2017-11-09 DIAGNOSIS — Z9119 Patient's noncompliance with other medical treatment and regimen: Secondary | ICD-10-CM | POA: Diagnosis not present

## 2017-11-09 DIAGNOSIS — Z7984 Long term (current) use of oral hypoglycemic drugs: Secondary | ICD-10-CM | POA: Diagnosis not present

## 2017-11-09 DIAGNOSIS — E039 Hypothyroidism, unspecified: Secondary | ICD-10-CM | POA: Diagnosis not present

## 2017-11-09 DIAGNOSIS — L308 Other specified dermatitis: Secondary | ICD-10-CM | POA: Diagnosis not present

## 2017-11-09 DIAGNOSIS — E790 Hyperuricemia without signs of inflammatory arthritis and tophaceous disease: Secondary | ICD-10-CM | POA: Diagnosis not present

## 2017-11-09 DIAGNOSIS — E559 Vitamin D deficiency, unspecified: Secondary | ICD-10-CM | POA: Diagnosis not present

## 2017-11-09 DIAGNOSIS — E785 Hyperlipidemia, unspecified: Secondary | ICD-10-CM | POA: Diagnosis not present

## 2017-11-09 DIAGNOSIS — I10 Essential (primary) hypertension: Secondary | ICD-10-CM | POA: Diagnosis not present

## 2017-11-09 DIAGNOSIS — E119 Type 2 diabetes mellitus without complications: Secondary | ICD-10-CM | POA: Diagnosis not present

## 2017-12-13 DIAGNOSIS — E1165 Type 2 diabetes mellitus with hyperglycemia: Secondary | ICD-10-CM | POA: Diagnosis not present

## 2017-12-13 DIAGNOSIS — Z9119 Patient's noncompliance with other medical treatment and regimen: Secondary | ICD-10-CM | POA: Diagnosis not present

## 2017-12-13 DIAGNOSIS — G4700 Insomnia, unspecified: Secondary | ICD-10-CM | POA: Diagnosis not present

## 2017-12-13 DIAGNOSIS — E119 Type 2 diabetes mellitus without complications: Secondary | ICD-10-CM | POA: Diagnosis not present

## 2017-12-13 DIAGNOSIS — I10 Essential (primary) hypertension: Secondary | ICD-10-CM | POA: Diagnosis not present

## 2017-12-13 DIAGNOSIS — E559 Vitamin D deficiency, unspecified: Secondary | ICD-10-CM | POA: Diagnosis not present

## 2017-12-13 DIAGNOSIS — F5101 Primary insomnia: Secondary | ICD-10-CM | POA: Diagnosis not present

## 2017-12-13 DIAGNOSIS — E039 Hypothyroidism, unspecified: Secondary | ICD-10-CM | POA: Diagnosis not present

## 2017-12-13 DIAGNOSIS — E785 Hyperlipidemia, unspecified: Secondary | ICD-10-CM | POA: Diagnosis not present

## 2017-12-13 DIAGNOSIS — Z7984 Long term (current) use of oral hypoglycemic drugs: Secondary | ICD-10-CM | POA: Diagnosis not present

## 2017-12-13 DIAGNOSIS — L308 Other specified dermatitis: Secondary | ICD-10-CM | POA: Diagnosis not present

## 2017-12-13 DIAGNOSIS — E790 Hyperuricemia without signs of inflammatory arthritis and tophaceous disease: Secondary | ICD-10-CM | POA: Diagnosis not present

## 2018-01-06 DIAGNOSIS — E790 Hyperuricemia without signs of inflammatory arthritis and tophaceous disease: Secondary | ICD-10-CM | POA: Diagnosis not present

## 2018-01-06 DIAGNOSIS — F5101 Primary insomnia: Secondary | ICD-10-CM | POA: Diagnosis not present

## 2018-01-06 DIAGNOSIS — E559 Vitamin D deficiency, unspecified: Secondary | ICD-10-CM | POA: Diagnosis not present

## 2018-01-06 DIAGNOSIS — I10 Essential (primary) hypertension: Secondary | ICD-10-CM | POA: Diagnosis not present

## 2018-01-06 DIAGNOSIS — R202 Paresthesia of skin: Secondary | ICD-10-CM | POA: Diagnosis not present

## 2018-01-06 DIAGNOSIS — Z9119 Patient's noncompliance with other medical treatment and regimen: Secondary | ICD-10-CM | POA: Diagnosis not present

## 2018-01-06 DIAGNOSIS — Z6839 Body mass index (BMI) 39.0-39.9, adult: Secondary | ICD-10-CM | POA: Diagnosis not present

## 2018-01-06 DIAGNOSIS — E785 Hyperlipidemia, unspecified: Secondary | ICD-10-CM | POA: Diagnosis not present

## 2018-01-06 DIAGNOSIS — E039 Hypothyroidism, unspecified: Secondary | ICD-10-CM | POA: Diagnosis not present

## 2018-01-06 DIAGNOSIS — M109 Gout, unspecified: Secondary | ICD-10-CM | POA: Diagnosis not present

## 2018-01-06 DIAGNOSIS — E669 Obesity, unspecified: Secondary | ICD-10-CM | POA: Diagnosis not present

## 2018-01-27 ENCOUNTER — Encounter (INDEPENDENT_AMBULATORY_CARE_PROVIDER_SITE_OTHER): Payer: Medicare Other

## 2018-01-27 ENCOUNTER — Ambulatory Visit (INDEPENDENT_AMBULATORY_CARE_PROVIDER_SITE_OTHER): Payer: Medicare Other | Admitting: Diagnostic Neuroimaging

## 2018-01-27 DIAGNOSIS — Z0289 Encounter for other administrative examinations: Secondary | ICD-10-CM

## 2018-01-27 DIAGNOSIS — R202 Paresthesia of skin: Secondary | ICD-10-CM | POA: Diagnosis not present

## 2018-01-27 DIAGNOSIS — R200 Anesthesia of skin: Secondary | ICD-10-CM | POA: Diagnosis not present

## 2018-01-31 NOTE — Procedures (Signed)
[facility]  NCS (NERVE CONDUCTION STUDY) WITH EMG (ELECTROMYOGRAPHY) REPORT   STUDY DATE: 01/31/18 PATIENT NAME: Caitlin CoxDonna [name] DOB: 1946-01-01 MRN: [id number]  ORDERING CLINICIAN: Leodis SiasFrancis Wong, MD  TECHNOLOGIST: Charlesetta IvoryBeau Handy  ELECTROMYOGRAPHER: Glenford BayleyVikram R. Ocie Stanzione, MD  CLINICAL INFORMATION: 72 year old female with left hand numbness.  FINDINGS: NERVE CONDUCTION STUDY: Bilateral median and left ulnar motor responses are normal.  Bilateral median to ulnar transcarpal mixed nerve comparisons have prolonged peak latency differences (left 0.5 ms, right 0.7 ms, normal less than or equal to 0.4 ms).  Bilateral median sensory responses are prolonged peak latencies and decreased amplitudes.  Bilateral ulnar sensory responses are normal.     NEEDLE ELECTROMYOGRAPHY:  Needle examination of left upper extremity deltoid, biceps, triceps, flexor carpi radialis, first dorsal interosseous is normal.   IMPRESSION:   Abnormal study demonstrating: - Bilateral median neuropathies at the wrist consistent with bilateral carpal tunnel syndrome.    INTERPRETING PHYSICIAN:  Suanne MarkerVIKRAM R. Reeve Mallo, MD Certified in Neurology, Neurophysiology and Neuroimaging  [facility] [address], [address] [place], [place] [address] [phone]   [facility]    Nerve / Sites Muscle Latency Ref. Amplitude Ref. Rel Amp Segments Distance Velocity Ref. Area    ms ms mV mV %  cm m/s m/s mVms  L Median - APB     Wrist APB 3.8 ?4.4 6.9 ?4.0 100 Wrist - APB 7   23.5     Upper arm APB 7.4  6.3  90.9 Upper arm - Wrist 19 51 ?49 23.6  R Median - APB     Wrist APB 3.9 ?4.4 8.0 ?4.0 100 Wrist - APB 7   25.7     Upper arm APB 7.7  7.7  96.4 Upper arm - Wrist 19 50 ?49 24.2  L Ulnar - ADM     Wrist ADM 2.3 ?3.3 10.2 ?6.0 100 Wrist - ADM 7   37.3     B.Elbow ADM 5.5  8.7  85.4 B.Elbow - Wrist 16 51 ?49 33.1     A.Elbow ADM 7.4  10.0  114 A.Elbow - B.Elbow 10 52 ?49 38.1   A.Elbow - Wrist               SNC    Nerve / Sites Rec. Site Peak Lat Ref.  Amp Ref. Segments Distance Peak Diff Ref.    ms ms V V  cm ms ms  L Median, Ulnar - Transcarpal comparison     Median Palm Wrist 2.5 ?2.2 32 ?35 Median Palm - Wrist 8       Ulnar Palm Wrist 2.0 ?2.2 17 ?12 Ulnar Palm - Wrist 8          Median Palm - Ulnar Palm  0.5 ?0.4  R Median, Ulnar - Transcarpal comparison     Median Palm Wrist 2.7 ?2.2 31 ?35 Median Palm - Wrist 8       Ulnar Palm Wrist 2.0 ?2.2 11 ?12 Ulnar Palm - Wrist 8          Median Palm - Ulnar Palm  0.7 ?0.4  L Median - Orthodromic (Dig II, Mid palm)     Dig II Wrist 3.7 ?3.4 8 ?10 Dig II - Wrist 13    R Median - Orthodromic (Dig II, Mid palm)     Dig II Wrist 3.8 ?3.4 9 ?10 Dig II - Wrist 13    L Ulnar - Orthodromic, (Dig V, Mid palm)  Dig V Wrist 2.8 ?3.1 10 ?5 Dig V - Wrist 11    R Ulnar - Orthodromic, (Dig V, Mid palm)     Dig V Wrist 2.7 ?3.1 5 ?5 Dig V - Wrist 42                   F  Wave    Nerve F Lat Ref.   ms ms  L Ulnar - ADM 27.0 ?32.0       EMG full       EMG Summary Table    Spontaneous MUAP Recruitment  Muscle IA Fib PSW Fasc Other Amp Dur. Poly Pattern  L. Deltoid Normal None None None _______ Normal Normal Normal Normal  L. Biceps brachii Normal None None None _______ Normal Normal Normal Normal  L. Triceps brachii Normal None None None _______ Normal Normal Normal Normal  L. Flexor carpi radialis Normal None None None _______ Normal Normal Normal Normal  L. First dorsal interosseous Normal None None None _______ Normal Normal Normal Normal

## 2018-02-07 DIAGNOSIS — E119 Type 2 diabetes mellitus without complications: Secondary | ICD-10-CM | POA: Diagnosis not present

## 2018-02-10 ENCOUNTER — Ambulatory Visit: Payer: Medicare Other | Admitting: Nutrition

## 2018-02-17 DIAGNOSIS — Z6839 Body mass index (BMI) 39.0-39.9, adult: Secondary | ICD-10-CM | POA: Diagnosis not present

## 2018-02-17 DIAGNOSIS — E119 Type 2 diabetes mellitus without complications: Secondary | ICD-10-CM | POA: Diagnosis not present

## 2018-02-17 DIAGNOSIS — E790 Hyperuricemia without signs of inflammatory arthritis and tophaceous disease: Secondary | ICD-10-CM | POA: Diagnosis not present

## 2018-02-17 DIAGNOSIS — E1165 Type 2 diabetes mellitus with hyperglycemia: Secondary | ICD-10-CM | POA: Diagnosis not present

## 2018-02-17 DIAGNOSIS — Z9119 Patient's noncompliance with other medical treatment and regimen: Secondary | ICD-10-CM | POA: Diagnosis not present

## 2018-02-17 DIAGNOSIS — E785 Hyperlipidemia, unspecified: Secondary | ICD-10-CM | POA: Diagnosis not present

## 2018-02-17 DIAGNOSIS — R202 Paresthesia of skin: Secondary | ICD-10-CM | POA: Diagnosis not present

## 2018-02-17 DIAGNOSIS — E669 Obesity, unspecified: Secondary | ICD-10-CM | POA: Diagnosis not present

## 2018-02-17 DIAGNOSIS — E039 Hypothyroidism, unspecified: Secondary | ICD-10-CM | POA: Diagnosis not present

## 2018-02-17 DIAGNOSIS — E559 Vitamin D deficiency, unspecified: Secondary | ICD-10-CM | POA: Diagnosis not present

## 2018-02-17 DIAGNOSIS — I10 Essential (primary) hypertension: Secondary | ICD-10-CM | POA: Diagnosis not present

## 2018-02-17 DIAGNOSIS — Z7984 Long term (current) use of oral hypoglycemic drugs: Secondary | ICD-10-CM | POA: Diagnosis not present

## 2018-03-02 ENCOUNTER — Ambulatory Visit: Payer: Medicare Other | Admitting: Nutrition

## 2018-04-28 ENCOUNTER — Ambulatory Visit: Payer: Federal, State, Local not specified - PPO | Admitting: Registered"

## 2018-05-05 DIAGNOSIS — E790 Hyperuricemia without signs of inflammatory arthritis and tophaceous disease: Secondary | ICD-10-CM | POA: Diagnosis not present

## 2018-05-05 DIAGNOSIS — E039 Hypothyroidism, unspecified: Secondary | ICD-10-CM | POA: Diagnosis not present

## 2018-05-05 DIAGNOSIS — M109 Gout, unspecified: Secondary | ICD-10-CM | POA: Diagnosis not present

## 2018-05-05 DIAGNOSIS — R202 Paresthesia of skin: Secondary | ICD-10-CM | POA: Diagnosis not present

## 2018-05-05 DIAGNOSIS — Z9119 Patient's noncompliance with other medical treatment and regimen: Secondary | ICD-10-CM | POA: Diagnosis not present

## 2018-05-05 DIAGNOSIS — E559 Vitamin D deficiency, unspecified: Secondary | ICD-10-CM | POA: Diagnosis not present

## 2018-05-05 DIAGNOSIS — E119 Type 2 diabetes mellitus without complications: Secondary | ICD-10-CM | POA: Diagnosis not present

## 2018-05-05 DIAGNOSIS — E669 Obesity, unspecified: Secondary | ICD-10-CM | POA: Diagnosis not present

## 2018-05-05 DIAGNOSIS — I10 Essential (primary) hypertension: Secondary | ICD-10-CM | POA: Diagnosis not present

## 2018-05-05 DIAGNOSIS — F5101 Primary insomnia: Secondary | ICD-10-CM | POA: Diagnosis not present

## 2018-05-05 DIAGNOSIS — E785 Hyperlipidemia, unspecified: Secondary | ICD-10-CM | POA: Diagnosis not present

## 2018-05-11 ENCOUNTER — Encounter: Payer: Self-pay | Admitting: Nutrition

## 2018-05-11 ENCOUNTER — Encounter: Payer: Medicare Other | Attending: Family Medicine | Admitting: Nutrition

## 2018-05-11 VITALS — Ht 60.0 in | Wt 205.0 lb

## 2018-05-11 DIAGNOSIS — E119 Type 2 diabetes mellitus without complications: Secondary | ICD-10-CM | POA: Diagnosis not present

## 2018-05-11 DIAGNOSIS — Z6841 Body Mass Index (BMI) 40.0 and over, adult: Secondary | ICD-10-CM | POA: Diagnosis not present

## 2018-05-11 DIAGNOSIS — E669 Obesity, unspecified: Secondary | ICD-10-CM

## 2018-05-11 DIAGNOSIS — E118 Type 2 diabetes mellitus with unspecified complications: Secondary | ICD-10-CM

## 2018-05-11 DIAGNOSIS — Z713 Dietary counseling and surveillance: Secondary | ICD-10-CM | POA: Insufficient documentation

## 2018-05-11 DIAGNOSIS — IMO0002 Reserved for concepts with insufficient information to code with codable children: Secondary | ICD-10-CM

## 2018-05-11 DIAGNOSIS — E1165 Type 2 diabetes mellitus with hyperglycemia: Secondary | ICD-10-CM

## 2018-05-11 DIAGNOSIS — I10 Essential (primary) hypertension: Secondary | ICD-10-CM | POA: Insufficient documentation

## 2018-05-11 DIAGNOSIS — E782 Mixed hyperlipidemia: Secondary | ICD-10-CM

## 2018-05-11 NOTE — Progress Notes (Signed)
  Medical Nutrition Therapy:  Appt start time: 0800 end time:  0900.  Assessment:  Primary concerns today: Diabetes Type 2. Most recent A1C 8.76%. Says she has had DM for 2 years or so. Admits to not taking her medications as prescribed at times. Has a libre. Eating 2-3 times per week.. Complains of bring hungry all the time and having the shakes at times. Drinks sodas, sweets. Sleeps in and doesn't eat til noon and then dinner and later at night. Avg BS was 146 mg/dl but her sensors keep falling off. Suppose to get new ones today. Not exercising. Gained 20 lbs in the last year she thinks. Reflux often.  Cholesterol 213 mg/dl  H, LDL 137 mg/dl. H   161  Is in a stressful relationship with her boyfriend. No support system. Admits to being depressed. Encouraged counseling. On meds already. Diet exceeds her needs contributing to her high blood sugars and weight gain. Admits to being an emotional eater. She is engaged to make changes to improve her DM and weight.   Preferred Learning Style  No preference indicated   Learning Readiness:   Ready  Change in progress   MEDICATIONS: See list   DIETARY INTAKE:  24-hr recall:  B ( 12 noon: Frosted flakes 2 cups with milk, water or coke Snk ( AM): rasin toast with cream cheese, coke  L ( PM): skipped Snk ( PM): cookies 6 D ( PM): cheese sandwich 1/2,  Cookies-oreo carrot cake 6 Snk ( PM): Beverages: coke, water,  Usual physical activity:   Estimated energy needs: 1500  calories 170 g carbohydrates 112 g protein 42 g fat  Progress Towards Goal(s):  In progress.   Nutritional Diagnosis:  NB-1.1 Food and nutrition-related knowledge deficit As related to Diabetes Type 2 .  As evidenced by A1C 8.6%.    Intervention:  Nutrition and Diabetes education provided on My Plate, CHO counting, meal planning, portion sizes, timing of meals, avoiding snacks between meals unless having a low blood sugar, target ranges for A1C and blood sugars,  signs/symptoms and treatment of hyper/hypoglycemia, monitoring blood sugars, taking medications as prescribed, benefits of exercising 30 minutes per day and prevention of complications of DM. Marland Kitchen.Goals 1. Follow My Plate 2. Eat three meals per day at times discussed 3. Cut out soda, sweets and junk food. 4. Walk 15 minutes a day 5. Drink only water  Lose 1 lb per week Get A1C less than 7%  Teaching Method Utilized:  Visual Auditory Hands on  Handouts given during visit include:  The Plate Method   Meal Plan Card  Barriers to learning/adherence to lifestyle change: none  Demonstrated degree of understanding via:  Teach Back   Monitoring/Evaluation:  Dietary intake, exercise, meal planning, and body weight in 1 month(s).

## 2018-05-11 NOTE — Patient Instructions (Signed)
Goals 1. Follow My Plate 2. Eat three meals per day at times discussed 3. Cut out soda, sweets and junk food. 4. Walk 15 minutes a day 5. Drink only water  Lose 1 lb per week Get A1C less than 7%

## 2018-07-05 ENCOUNTER — Ambulatory Visit: Payer: Federal, State, Local not specified - PPO | Admitting: Nutrition

## 2018-07-06 ENCOUNTER — Ambulatory Visit: Payer: Medicare Other | Admitting: Nutrition

## 2018-09-06 DIAGNOSIS — H04123 Dry eye syndrome of bilateral lacrimal glands: Secondary | ICD-10-CM | POA: Diagnosis not present

## 2018-09-22 DIAGNOSIS — E559 Vitamin D deficiency, unspecified: Secondary | ICD-10-CM | POA: Diagnosis not present

## 2018-09-22 DIAGNOSIS — Z9119 Patient's noncompliance with other medical treatment and regimen: Secondary | ICD-10-CM | POA: Diagnosis not present

## 2018-09-22 DIAGNOSIS — Z23 Encounter for immunization: Secondary | ICD-10-CM | POA: Diagnosis not present

## 2018-09-22 DIAGNOSIS — I10 Essential (primary) hypertension: Secondary | ICD-10-CM | POA: Diagnosis not present

## 2018-09-22 DIAGNOSIS — R202 Paresthesia of skin: Secondary | ICD-10-CM | POA: Diagnosis not present

## 2018-09-22 DIAGNOSIS — E039 Hypothyroidism, unspecified: Secondary | ICD-10-CM | POA: Diagnosis not present

## 2018-09-22 DIAGNOSIS — M109 Gout, unspecified: Secondary | ICD-10-CM | POA: Diagnosis not present

## 2018-09-22 DIAGNOSIS — E669 Obesity, unspecified: Secondary | ICD-10-CM | POA: Diagnosis not present

## 2018-09-22 DIAGNOSIS — F5101 Primary insomnia: Secondary | ICD-10-CM | POA: Diagnosis not present

## 2018-09-22 DIAGNOSIS — E790 Hyperuricemia without signs of inflammatory arthritis and tophaceous disease: Secondary | ICD-10-CM | POA: Diagnosis not present

## 2018-09-22 DIAGNOSIS — E119 Type 2 diabetes mellitus without complications: Secondary | ICD-10-CM | POA: Diagnosis not present

## 2018-09-22 DIAGNOSIS — E785 Hyperlipidemia, unspecified: Secondary | ICD-10-CM | POA: Diagnosis not present

## 2018-11-11 ENCOUNTER — Institutional Professional Consult (permissible substitution): Payer: Self-pay | Admitting: Plastic Surgery

## 2019-03-13 DIAGNOSIS — I10 Essential (primary) hypertension: Secondary | ICD-10-CM | POA: Diagnosis not present

## 2019-03-13 DIAGNOSIS — E785 Hyperlipidemia, unspecified: Secondary | ICD-10-CM | POA: Diagnosis not present

## 2019-03-13 DIAGNOSIS — E669 Obesity, unspecified: Secondary | ICD-10-CM | POA: Diagnosis not present

## 2019-03-13 DIAGNOSIS — E559 Vitamin D deficiency, unspecified: Secondary | ICD-10-CM | POA: Diagnosis not present

## 2019-03-13 DIAGNOSIS — Z9119 Patient's noncompliance with other medical treatment and regimen: Secondary | ICD-10-CM | POA: Diagnosis not present

## 2019-03-13 DIAGNOSIS — R21 Rash and other nonspecific skin eruption: Secondary | ICD-10-CM | POA: Diagnosis not present

## 2019-03-13 DIAGNOSIS — M109 Gout, unspecified: Secondary | ICD-10-CM | POA: Diagnosis not present

## 2019-03-13 DIAGNOSIS — E039 Hypothyroidism, unspecified: Secondary | ICD-10-CM | POA: Diagnosis not present

## 2019-03-13 DIAGNOSIS — E119 Type 2 diabetes mellitus without complications: Secondary | ICD-10-CM | POA: Diagnosis not present

## 2019-12-25 DIAGNOSIS — U071 COVID-19: Secondary | ICD-10-CM

## 2019-12-25 HISTORY — DX: COVID-19: U07.1

## 2019-12-29 DIAGNOSIS — E669 Obesity, unspecified: Secondary | ICD-10-CM | POA: Diagnosis not present

## 2019-12-29 DIAGNOSIS — R5383 Other fatigue: Secondary | ICD-10-CM | POA: Diagnosis not present

## 2019-12-29 DIAGNOSIS — E119 Type 2 diabetes mellitus without complications: Secondary | ICD-10-CM | POA: Diagnosis not present

## 2019-12-29 DIAGNOSIS — E039 Hypothyroidism, unspecified: Secondary | ICD-10-CM | POA: Diagnosis not present

## 2019-12-29 DIAGNOSIS — M109 Gout, unspecified: Secondary | ICD-10-CM | POA: Diagnosis not present

## 2019-12-29 DIAGNOSIS — E785 Hyperlipidemia, unspecified: Secondary | ICD-10-CM | POA: Diagnosis not present

## 2019-12-29 DIAGNOSIS — U071 COVID-19: Secondary | ICD-10-CM | POA: Diagnosis not present

## 2019-12-29 DIAGNOSIS — Z9119 Patient's noncompliance with other medical treatment and regimen: Secondary | ICD-10-CM | POA: Diagnosis not present

## 2019-12-29 DIAGNOSIS — J988 Other specified respiratory disorders: Secondary | ICD-10-CM | POA: Diagnosis not present

## 2019-12-29 DIAGNOSIS — R61 Generalized hyperhidrosis: Secondary | ICD-10-CM | POA: Diagnosis not present

## 2019-12-29 DIAGNOSIS — E559 Vitamin D deficiency, unspecified: Secondary | ICD-10-CM | POA: Diagnosis not present

## 2019-12-29 DIAGNOSIS — I10 Essential (primary) hypertension: Secondary | ICD-10-CM | POA: Diagnosis not present

## 2019-12-30 ENCOUNTER — Emergency Department (HOSPITAL_COMMUNITY): Payer: Medicare Other

## 2019-12-30 ENCOUNTER — Encounter (HOSPITAL_COMMUNITY): Payer: Self-pay | Admitting: Emergency Medicine

## 2019-12-30 ENCOUNTER — Inpatient Hospital Stay (HOSPITAL_COMMUNITY)
Admission: EM | Admit: 2019-12-30 | Discharge: 2020-01-04 | DRG: 177 | Disposition: A | Discharge status: Skilled Nursing Facility | Payer: Medicare Other | Attending: Internal Medicine | Admitting: Internal Medicine

## 2019-12-30 ENCOUNTER — Telehealth: Payer: Self-pay | Admitting: Family Medicine

## 2019-12-30 ENCOUNTER — Other Ambulatory Visit: Payer: Self-pay

## 2019-12-30 DIAGNOSIS — Z79890 Hormone replacement therapy (postmenopausal): Secondary | ICD-10-CM | POA: Diagnosis not present

## 2019-12-30 DIAGNOSIS — R0902 Hypoxemia: Secondary | ICD-10-CM | POA: Diagnosis not present

## 2019-12-30 DIAGNOSIS — J45901 Unspecified asthma with (acute) exacerbation: Secondary | ICD-10-CM | POA: Diagnosis not present

## 2019-12-30 DIAGNOSIS — G4700 Insomnia, unspecified: Secondary | ICD-10-CM | POA: Diagnosis present

## 2019-12-30 DIAGNOSIS — M109 Gout, unspecified: Secondary | ICD-10-CM | POA: Diagnosis present

## 2019-12-30 DIAGNOSIS — Z90710 Acquired absence of both cervix and uterus: Secondary | ICD-10-CM | POA: Diagnosis not present

## 2019-12-30 DIAGNOSIS — J1282 Pneumonia due to coronavirus disease 2019: Secondary | ICD-10-CM | POA: Diagnosis not present

## 2019-12-30 DIAGNOSIS — I10 Essential (primary) hypertension: Secondary | ICD-10-CM | POA: Diagnosis present

## 2019-12-30 DIAGNOSIS — U071 COVID-19: Principal | ICD-10-CM

## 2019-12-30 DIAGNOSIS — E871 Hypo-osmolality and hyponatremia: Secondary | ICD-10-CM | POA: Diagnosis not present

## 2019-12-30 DIAGNOSIS — E119 Type 2 diabetes mellitus without complications: Secondary | ICD-10-CM | POA: Diagnosis not present

## 2019-12-30 DIAGNOSIS — Z886 Allergy status to analgesic agent status: Secondary | ICD-10-CM | POA: Diagnosis not present

## 2019-12-30 DIAGNOSIS — Z6837 Body mass index (BMI) 37.0-37.9, adult: Secondary | ICD-10-CM | POA: Diagnosis not present

## 2019-12-30 DIAGNOSIS — J9621 Acute and chronic respiratory failure with hypoxia: Secondary | ICD-10-CM | POA: Diagnosis present

## 2019-12-30 DIAGNOSIS — F329 Major depressive disorder, single episode, unspecified: Secondary | ICD-10-CM | POA: Diagnosis present

## 2019-12-30 DIAGNOSIS — E669 Obesity, unspecified: Secondary | ICD-10-CM | POA: Diagnosis present

## 2019-12-30 DIAGNOSIS — E1165 Type 2 diabetes mellitus with hyperglycemia: Secondary | ICD-10-CM | POA: Diagnosis not present

## 2019-12-30 DIAGNOSIS — E559 Vitamin D deficiency, unspecified: Secondary | ICD-10-CM | POA: Diagnosis present

## 2019-12-30 DIAGNOSIS — I951 Orthostatic hypotension: Secondary | ICD-10-CM | POA: Diagnosis not present

## 2019-12-30 DIAGNOSIS — Z79899 Other long term (current) drug therapy: Secondary | ICD-10-CM | POA: Diagnosis not present

## 2019-12-30 DIAGNOSIS — M6281 Muscle weakness (generalized): Secondary | ICD-10-CM | POA: Diagnosis not present

## 2019-12-30 DIAGNOSIS — J4530 Mild persistent asthma, uncomplicated: Secondary | ICD-10-CM | POA: Diagnosis present

## 2019-12-30 DIAGNOSIS — Z888 Allergy status to other drugs, medicaments and biological substances status: Secondary | ICD-10-CM

## 2019-12-30 DIAGNOSIS — M2550 Pain in unspecified joint: Secondary | ICD-10-CM | POA: Diagnosis not present

## 2019-12-30 DIAGNOSIS — E6609 Other obesity due to excess calories: Secondary | ICD-10-CM | POA: Diagnosis present

## 2019-12-30 DIAGNOSIS — J9610 Chronic respiratory failure, unspecified whether with hypoxia or hypercapnia: Secondary | ICD-10-CM | POA: Diagnosis not present

## 2019-12-30 DIAGNOSIS — F4321 Adjustment disorder with depressed mood: Secondary | ICD-10-CM | POA: Diagnosis present

## 2019-12-30 DIAGNOSIS — R0602 Shortness of breath: Secondary | ICD-10-CM | POA: Diagnosis not present

## 2019-12-30 DIAGNOSIS — I119 Hypertensive heart disease without heart failure: Secondary | ICD-10-CM | POA: Diagnosis present

## 2019-12-30 DIAGNOSIS — E876 Hypokalemia: Secondary | ICD-10-CM | POA: Diagnosis present

## 2019-12-30 DIAGNOSIS — R05 Cough: Secondary | ICD-10-CM | POA: Diagnosis not present

## 2019-12-30 DIAGNOSIS — E7800 Pure hypercholesterolemia, unspecified: Secondary | ICD-10-CM | POA: Diagnosis not present

## 2019-12-30 DIAGNOSIS — E785 Hyperlipidemia, unspecified: Secondary | ICD-10-CM | POA: Diagnosis present

## 2019-12-30 DIAGNOSIS — Z833 Family history of diabetes mellitus: Secondary | ICD-10-CM | POA: Diagnosis not present

## 2019-12-30 DIAGNOSIS — Z7401 Bed confinement status: Secondary | ICD-10-CM | POA: Diagnosis not present

## 2019-12-30 DIAGNOSIS — Z9114 Patient's other noncompliance with medication regimen: Secondary | ICD-10-CM

## 2019-12-30 DIAGNOSIS — J9601 Acute respiratory failure with hypoxia: Secondary | ICD-10-CM | POA: Diagnosis not present

## 2019-12-30 DIAGNOSIS — E790 Hyperuricemia without signs of inflammatory arthritis and tophaceous disease: Secondary | ICD-10-CM | POA: Diagnosis present

## 2019-12-30 DIAGNOSIS — Z7984 Long term (current) use of oral hypoglycemic drugs: Secondary | ICD-10-CM

## 2019-12-30 DIAGNOSIS — R52 Pain, unspecified: Secondary | ICD-10-CM | POA: Diagnosis not present

## 2019-12-30 LAB — CBC WITH DIFFERENTIAL/PLATELET
Abs Immature Granulocytes: 0.03 10*3/uL (ref 0.00–0.07)
Basophils Absolute: 0 10*3/uL (ref 0.0–0.1)
Basophils Relative: 0 %
Eosinophils Absolute: 0 10*3/uL (ref 0.0–0.5)
Eosinophils Relative: 0 %
HCT: 43.6 % (ref 36.0–46.0)
Hemoglobin: 14.7 g/dL (ref 12.0–15.0)
Immature Granulocytes: 0 %
Lymphocytes Relative: 15 %
Lymphs Abs: 1.2 10*3/uL (ref 0.7–4.0)
MCH: 28.5 pg (ref 26.0–34.0)
MCHC: 33.7 g/dL (ref 30.0–36.0)
MCV: 84.7 fL (ref 80.0–100.0)
Monocytes Absolute: 0.6 10*3/uL (ref 0.1–1.0)
Monocytes Relative: 8 %
Neutro Abs: 5.7 10*3/uL (ref 1.7–7.7)
Neutrophils Relative %: 77 %
Platelets: 204 10*3/uL (ref 150–400)
RBC: 5.15 MIL/uL — ABNORMAL HIGH (ref 3.87–5.11)
RDW: 12.7 % (ref 11.5–15.5)
WBC: 7.6 10*3/uL (ref 4.0–10.5)
nRBC: 0 % (ref 0.0–0.2)

## 2019-12-30 LAB — COMPREHENSIVE METABOLIC PANEL
ALT: 36 U/L (ref 0–44)
AST: 64 U/L — ABNORMAL HIGH (ref 15–41)
Albumin: 3.1 g/dL — ABNORMAL LOW (ref 3.5–5.0)
Alkaline Phosphatase: 75 U/L (ref 38–126)
Anion gap: 13 (ref 5–15)
BUN: 18 mg/dL (ref 8–23)
CO2: 23 mmol/L (ref 22–32)
Calcium: 8.2 mg/dL — ABNORMAL LOW (ref 8.9–10.3)
Chloride: 96 mmol/L — ABNORMAL LOW (ref 98–111)
Creatinine, Ser: 1.1 mg/dL — ABNORMAL HIGH (ref 0.44–1.00)
GFR calc Af Amer: 58 mL/min — ABNORMAL LOW (ref >60)
GFR calc non Af Amer: 50 mL/min — ABNORMAL LOW (ref >60)
Glucose, Bld: 227 mg/dL — ABNORMAL HIGH (ref 70–99)
Potassium: 3.8 mmol/L (ref 3.5–5.1)
Sodium: 132 mmol/L — ABNORMAL LOW (ref 135–145)
Total Bilirubin: 1.2 mg/dL (ref 0.3–1.2)
Total Protein: 7.8 g/dL (ref 6.5–8.1)

## 2019-12-30 LAB — FIBRINOGEN: Fibrinogen: 633 mg/dL — ABNORMAL HIGH (ref 210–475)

## 2019-12-30 LAB — C-REACTIVE PROTEIN: CRP: 10.8 mg/dL — ABNORMAL HIGH (ref <1.0)

## 2019-12-30 LAB — LACTATE DEHYDROGENASE: LDH: 211 U/L — ABNORMAL HIGH (ref 98–192)

## 2019-12-30 LAB — D-DIMER, QUANTITATIVE: D-Dimer, Quant: 0.95 ug/mL-FEU — ABNORMAL HIGH (ref 0.00–0.50)

## 2019-12-30 LAB — TRIGLYCERIDES: Triglycerides: 45 mg/dL (ref <150)

## 2019-12-30 LAB — FERRITIN: Ferritin: 419 ng/mL — ABNORMAL HIGH (ref 11–307)

## 2019-12-30 LAB — POC SARS CORONAVIRUS 2 AG -  ED: SARS Coronavirus 2 Ag: POSITIVE — AB

## 2019-12-30 LAB — LACTIC ACID, PLASMA: Lactic Acid, Venous: 1.8 mmol/L (ref 0.5–1.9)

## 2019-12-30 LAB — PROCALCITONIN: Procalcitonin: <0.1 ng/mL

## 2019-12-30 MED ORDER — DEXAMETHASONE SODIUM PHOSPHATE 10 MG/ML IJ SOLN
10.0000 mg | Freq: Once | INTRAMUSCULAR | Status: AC
  Administered 2019-12-30: 10 mg via INTRAVENOUS
  Filled 2019-12-30: qty 1

## 2019-12-30 MED ORDER — SODIUM CHLORIDE 0.9 % IV SOLN
100.0000 mg | Freq: Every day | INTRAVENOUS | Status: AC
  Administered 2019-12-31 – 2020-01-03 (×4): 100 mg via INTRAVENOUS
  Filled 2019-12-30 (×4): qty 20

## 2019-12-30 MED ORDER — SODIUM CHLORIDE 0.9 % IV SOLN
200.0000 mg | Freq: Once | INTRAVENOUS | Status: AC
  Administered 2019-12-30: 200 mg via INTRAVENOUS
  Filled 2019-12-30: qty 200

## 2019-12-30 NOTE — ED Triage Notes (Signed)
Patient reports called by PCP yesterday and told Covid+. States weakness and cough today. Denies chest pain and SOB. Reports N/V/D previously but states those symptoms have resolved.

## 2019-12-30 NOTE — H&P (Signed)
History and Physical    Caitlin Harrington:[id number] DOB: 11-20-1946 DOA: 12/30/2019  PCP: Ileana Ladd, MD  Patient coming from: Home  I have personally briefly reviewed patient's old medical records in [facility]  Chief Complaint: Weakness, shortness of breath, COVID  HPI: Caitlin Harrington is a 74 y.o. female with medical history significant for obesity, hypertension, diabetes gout, hypercholesteremia, asthma, who presented with weakness shortness of breath found to have Covid pneumonia.  She explains that approximately 1 week ago she began developing generalized weakness as progressed, associated with cough, fatigue body aches and intermittent dyspnea on exertion, which is new for her.  Test positive for COVID-19 at her primary care physician's office yesterday. In recent past, starting about 3 weeks ago, she has experienced associated nausea, associated with emesis and diarrhea, that is now resolving.   She reports her fianc is a Naval architect and also has had nausea/vomiting/diarrhea over the last 3 weeks, and has been on the road frequently, she reports she suspects she may have gotten it from him. Endorses occasional chills over the last week.  Endorses myalgias.  Denies fever, chest pain, abdominal pain, leg swelling.  ED Course: On arrival to the ED pt was saturating respiratory rate 26, 91% on room air. Labs and imaging studies acquired. Initial labwork notable for WBC 7.6, hemoglobin 14.7 creatinine 1.1 slightly from baseline, lactic acid 1.8. Imaging studies notable for chest x-ray with mild cardiomegaly, low lung volumes.  Blood cultures obtained.  Patient was administered Decadron 10 mg IV, remdesivir 200 mg IV. Given concern for Covid, decision was made to admit.   Review of Systems: As per HPI otherwise 10 point review of systems negative.   Past Medical History:  Diagnosis Date  . Asthma   . Depression   . Diabetes mellitus without complication (HCC)   . Fatigue   .  Gout   . Hypertension   . Insomnia   . Numbness   . Obesity   . Paresthesia   . Vitamin D deficiency     Past Surgical History:  Procedure Laterality Date  . ABDOMINAL HYSTERECTOMY       reports that she has never smoked. She has never used smokeless tobacco. She reports that she does not use drugs. No history on file for alcohol.  Allergies  Allergen Reactions  . Motrin [Ibuprofen] Hives  . Atorvastatin Hives and Rash    chills  . Meloxicam Hives and Rash  . Statins Hives and Rash    Family History  Problem Relation Age of Onset  . Diabetes Maternal Grandfather     Prior to Admission medications   Medication Sig Start Date End Date Taking? Authorizing Provider  albuterol (PROVENTIL HFA) 108 (90 BASE) MCG/ACT inhaler Inhale 2 puffs into the lungs every 6 (six) hours as needed. 10/22/14   Ernestina Penna, MD  allopurinol (ZYLOPRIM) 100 MG tablet Take 1 tablet (100 mg total) by mouth daily. Patient taking differently: Take 300 mg by mouth daily.  06/11/14   Ernestina Penna, MD  amLODipine (NORVASC) 5 MG tablet Take 5 mg by mouth daily.  11/06/16   [provider]  buPROPion (WELLBUTRIN) 100 MG tablet Take 1 tablet (100 mg total) by mouth 2 (two) times daily. Patient not taking: Reported on 05/11/2018 09/18/13   Ileana Ladd, MD  Cholecalciferol (VITAMIN D) 2000 UNITS CAPS Take 1 capsule by mouth daily.      [provider]  colchicine 0.6 MG  tablet Take 0.6 mg by mouth daily.    [provider]  colesevelam (WELCHOL) 625 MG tablet Take 625 mg by mouth daily.    [provider]  FLUoxetine (PROZAC) 20 MG tablet Take 1 tablet (20 mg total) by mouth daily. Patient not taking: Reported on 05/11/2018 09/18/13   Vernie Shanks, MD  fluticasone [facility]) 50 MCG/ACT nasal spray Place 1 spray into both nostrils daily as needed for allergies or rhinitis.  09/29/16   [provider]  glipiZIDE (GLUCOTROL) 5 MG tablet Take by mouth daily  before breakfast.    [provider]  losartan-hydrochlorothiazide (HYZAAR) 100-25 MG tablet Take 1 tablet by mouth daily. 10/29/16   [provider]  metFORMIN (GLUCOPHAGE) 500 MG tablet Take 500 mg by mouth 2 (two) times daily with a meal.    [provider]  pravastatin (PRAVACHOL) 10 MG tablet Take 20 mg by mouth daily.     [provider]  terazosin (HYTRIN) 2 MG capsule Take 4 mg by mouth at bedtime. 10/23/16   [provider]  zolpidem (AMBIEN) 5 MG tablet Take 5 mg by mouth at bedtime. 10/29/16   [provider]    Physical Exam: Vitals:   12/30/19 1602 12/30/19 1630  BP: 130/82 140/74  Pulse: 90 84  Resp: (!) 22 (!) 26  Temp: 98.5 F (36.9 C)   SpO2: 91% 92%   Constitutional: NAD, calm, comfortable Eyes: PERRL, lids and conjunctivae normal ENMT: Mucous membranes are moist. Posterior pharynx clear of any exudate or lesions. Neck: normal, supple, no masses, no thyromegaly Respiratory: Diminished breath sounds bilaterally, slight rales bibasilar left lung, no wheezing. Normal respiratory effort. No accessory muscle use.  Saturating low 90s on room air at rest, desaturates to high 80s with minimal exertion. Cardiovascular: Regular rate and rhythm, no murmurs / rubs / gallops. No extremity edema. 2+ pedal pulses. No carotid bruits.  Abdomen: no tenderness, no masses palpated. No hepatosplenomegaly. Bowel sounds positive.  Musculoskeletal: no clubbing / cyanosis. No joint deformity upper and lower extremities. Good ROM, no contractures. Normal muscle tone.  Skin: no rashes, lesions, ulcers. No induration Neurologic: CN 2-12 grossly intact. Sensation intact. Strength 5/5 in all 4.  Psychiatric: Normal judgment and insight. Alert and oriented x 3. Normal mood.   Labs on Admission: I have personally reviewed following labs and imaging studies  CBC: Recent Labs  Lab 12/30/19 1715  WBC 7.6  NEUTROABS 5.7  HGB 14.7  HCT 43.6  MCV  84.7  PLT 409   Basic Metabolic Panel: Recent Labs  Lab 12/30/19 1715  NA 132*  K 3.8  CL 96*  CO2 23  GLUCOSE 227*  BUN 18  CREATININE 1.10*  CALCIUM 8.2*   GFR: CrCl cannot be calculated (Unknown ideal weight.). Liver Function Tests: Recent Labs  Lab 12/30/19 1715  AST 64*  ALT 36  ALKPHOS 75  BILITOT 1.2  PROT 7.8  ALBUMIN 3.1*   No results for input(s): LIPASE, AMYLASE in the last 168 hours. No results for input(s): AMMONIA in the last 168 hours. Coagulation Profile: No results for input(s): INR, PROTIME in the last 168 hours. Cardiac Enzymes: No results for input(s): CKTOTAL, CKMB, CKMBINDEX, TROPONINI in the last 168 hours. BNP (last 3 results) No results for input(s): PROBNP in the last 8760 hours. HbA1C: No results for input(s): HGBA1C in the last 72 hours. CBG: No results for input(s): GLUCAP in the last 168 hours. Lipid Profile: No results for input(s): CHOL,  HDL, LDLCALC, TRIG, CHOLHDL, LDLDIRECT in the last 72 hours. Thyroid Function Tests: No results for input(s): TSH, T4TOTAL, FREET4, T3FREE, THYROIDAB in the last 72 hours. Anemia Panel: Recent Labs    12/30/19 1715  FERRITIN 419*   Urine analysis:    Component Value Date/Time   BILIRUBINUR neg 09/21/2013 1236   PROTEINUR neg 09/21/2013 1236   UROBILINOGEN negative 09/21/2013 1236   NITRITE neg 09/21/2013 1236   LEUKOCYTESUR Trace 09/21/2013 1236    Radiological Exams on Admission: DG Chest Port 1 View  Result Date: 12/30/2019 CLINICAL DATA:  COVID positive.  Weakness, cough EXAM: PORTABLE CHEST 1 VIEW COMPARISON:  11276 FINDINGS: Mild cardiomegaly. No confluent airspace opacities or effusions. Low lung volumes. No acute bony abnormality. IMPRESSION: Mild cardiomegaly.  Low volumes.  No confluent opacities. Electronically Signed   By: Charlett Nose M.D.   On: 12/30/2019 16:56    EKG: Independently reviewed.  Sinus rhythm, rate 88, QTc 426.  Assessment/Plan Active Problems:    COVID-19  SARS COVID-19 infection/pneumonia -Appears her symptoms may have started approximately 2 to 3 weeks ago with more enteritis symptoms, that only in the last week or progressing to include chills, weakness, fatigue, myalgias and now dyspnea on exertion, causing her to present to the hospital. -Has moderately elevated inflammatory markers (D-dimer 0.95, LDH 211, ferritin 419, CRP 10.8) and cardiomegaly with no clear infiltrate on x-ray though physical exam with rales in left lower lobe, O2 sats are 91 to 92% at rest, 87 to 88% with minimal exertion -Procalcitonin less than 0.1 Plan: -Start IV remdesivir and Decadron -Antitussives -Hold antibiotics given procalcitonin undetectable -Follow and monitor CRP, D-dimer, ferritin daily -Advised patient to self paren -Labs in a.m. -Incentive spirometry/flutter valve -Zinc/multivitamin  Type 2 diabetes -Given type 2 diabetes with hyperglycemia and COVID-19 pneumonia, will start Tradjenta which is shown mortality benefit.  Additionally cover with corrective NovoLog. -Carb consistent diet  Hypertension -Continue home amlodipine for now  Gout -Continue allopurinol and colchicine (colchicine has been shown to potentially have benefit in COVID-19 pneumonia patients as well)  Hyperlipidemia -Continue home pravastatin   DVT prophylaxis: Lovenox Code Status: Full Family Communication: None at bedside Disposition Plan: Likely discharge home in 2 to 3 days pending clinical improvement Consults called: None Admission status: Admit to [facility] inpatient   S. Dellie Catholic, DO [facility] Pager [phone]  If 7PM-7AM, please contact night-coverage [email or web address] Password [facility]  12/30/2019, 6:34 PM

## 2019-12-30 NOTE — Telephone Encounter (Signed)
Copied from CRM [id number]. Topic: General - Other >> Dec 30, 2019  3:35 PM Jaquita Rector A wrote: Reason for CRM: [name] patients granddaughter called to speak to Angus Seller about her grand mother getting the IV infusion as soon as she can be scheduled since she is covid positive. Lesly Rubenstein can be reached at Ph# [phone]

## 2019-12-30 NOTE — ED Notes (Signed)
Pt given crackers and water and sandwich because she states she hasn't eaten all day today.

## 2019-12-30 NOTE — ED Notes (Signed)
[facility] notified for transport to [facility].

## 2019-12-30 NOTE — ED Provider Notes (Signed)
[facility]-EMERGENCY DEPT Provider Note   CSN: [id number] Arrival date & time: 12/30/19  1525     History Chief Complaint  Patient presents with  . Covid+    Caitlin Harrington is a 74 y.o. female history of obesity, hypertension, diabetes, gout, hypercholesterolemia, asthma.  Patient presents today for generalized weakness, cough, fatigue, body aches and intermittent shortness of breath. She tested positive for COVID-19 viral infection through [facility] physicians office yesterday. She reports that she has felt ill for approximately 2-3 weeks. She reports that her fianc is a truck driver and they both have been feeling unwell for quite some time. She reports that she initially developed nausea with nonbloody/nonbilious emesis and nonbloody diarrhea that lasted approximately 1 week before resolving. Over the past 1-2 weeks she has noticed that she has felt increasingly fatigue, she has had a nonproductive cough and feels slightly short of breath with ambulation, she reports that she often has to sit down after short distances to regain her strength.  She denies any recent fever/chills, she denies any chest pain, hemoptysis, abdominal pain, any recent nausea/vomiting/diarrhea, extremity swelling/color change, numbness/tingling, weakness, dysuria/hematuria or any additional concerns. HPI     Past Medical History:  Diagnosis Date  . Asthma   . Depression   . Diabetes mellitus without complication (HCC)   . Fatigue   . Gout   . Hypertension   . Insomnia   . Numbness   . Obesity   . Paresthesia   . Vitamin D deficiency     Patient Active Problem List   Diagnosis Date Noted  . Gout   . HTN (hypertension) 02/18/2011  . Prolonged depressive adjustment reaction 02/18/2011  . Obesity 02/18/2011  . Asthma, mild persistent 02/18/2011  . Vitamin D deficiency 02/18/2011  . Hypercholesteremia 02/18/2011  . DM (diabetes mellitus) (HCC) 02/18/2011  . Insomnia 02/18/2011   . Stress at work 02/18/2011    Past Surgical History:  Procedure Laterality Date  . ABDOMINAL HYSTERECTOMY       OB History   No obstetric history on file.     No family history on file.  Social History   Tobacco Use  . Smoking status: Never Smoker  . Smokeless tobacco: Never Used  Substance Use Topics  . Alcohol use: Not on file  . Drug use: No    Home Medications Prior to Admission medications   Medication Sig Start Date End Date Taking? Authorizing Provider  albuterol (PROVENTIL HFA) 108 (90 BASE) MCG/ACT inhaler Inhale 2 puffs into the lungs every 6 (six) hours as needed. 10/22/14   Ernestina Penna, MD  allopurinol (ZYLOPRIM) 100 MG tablet Take 1 tablet (100 mg total) by mouth daily. Patient taking differently: Take 300 mg by mouth daily.  06/11/14   Ernestina Penna, MD  amLODipine (NORVASC) 5 MG tablet Take 5 mg by mouth daily.  11/06/16   [provider]  buPROPion (WELLBUTRIN) 100 MG tablet Take 1 tablet (100 mg total) by mouth 2 (two) times daily. Patient not taking: Reported on 05/11/2018 09/18/13   Ileana Ladd, MD  Cholecalciferol (VITAMIN D) 2000 UNITS CAPS Take 1 capsule by mouth daily.      [provider]  colchicine 0.6 MG tablet Take 0.6 mg by mouth daily.    [provider]  colesevelam (WELCHOL) 625 MG tablet Take 625 mg by mouth daily.    [provider]  FLUoxetine (PROZAC) 20 MG tablet Take 1 tablet (20 mg total) by  mouth daily. Patient not taking: Reported on 05/11/2018 09/18/13   Ileana Ladd, MD  fluticasone [facility]) 50 MCG/ACT nasal spray Place 1 spray into both nostrils daily as needed for allergies or rhinitis.  09/29/16   [provider]  glipiZIDE (GLUCOTROL) 5 MG tablet Take by mouth daily before breakfast.    [provider]  losartan-hydrochlorothiazide (HYZAAR) 100-25 MG tablet Take 1 tablet by mouth daily. 10/29/16   [provider]  metFORMIN (GLUCOPHAGE) 500 MG tablet Take  500 mg by mouth 2 (two) times daily with a meal.    [provider]  pravastatin (PRAVACHOL) 10 MG tablet Take 20 mg by mouth daily.     [provider]  terazosin (HYTRIN) 2 MG capsule Take 4 mg by mouth at bedtime. 10/23/16   [provider]  zolpidem (AMBIEN) 5 MG tablet Take 5 mg by mouth at bedtime. 10/29/16   [provider]    Allergies    Motrin [ibuprofen], Atorvastatin, Meloxicam, and Statins  Review of Systems   Review of Systems Ten systems are reviewed and are negative for acute change except as noted in the HPI  Physical Exam Updated Vital Signs BP 140/74   Pulse 84   Temp 98.5 F (36.9 C)   Resp (!) 26   SpO2 92%   Physical Exam Constitutional:      General: She is not in acute distress.    Appearance: Normal appearance. She is well-developed. She is obese. She is ill-appearing. She is not toxic-appearing or diaphoretic.  HENT:     Head: Normocephalic and atraumatic.     Right Ear: External ear normal.     Left Ear: External ear normal.     Nose: Nose normal.  Eyes:     General: Vision grossly intact. Gaze aligned appropriately.     Pupils: Pupils are equal, round, and reactive to light.  Neck:     Trachea: Trachea and phonation normal. No tracheal deviation.  Cardiovascular:     Rate and Rhythm: Normal rate and regular rhythm.     Pulses: Normal pulses.     Heart sounds: Normal heart sounds.  Pulmonary:     Effort: Pulmonary effort is normal. No accessory muscle usage or respiratory distress.     Breath sounds: Normal air entry. Decreased breath sounds present.  Abdominal:     General: There is no distension.     Palpations: Abdomen is soft.     Tenderness: There is no abdominal tenderness. There is no guarding or rebound.  Musculoskeletal:        General: Normal range of motion.     Cervical back: Normal range of motion.     Right lower leg: No edema.     Left lower leg: No edema.  Skin:    General: Skin is warm  and dry.  Neurological:     Mental Status: She is alert.     GCS: GCS eye subscore is 4. GCS verbal subscore is 5. GCS motor subscore is 6.     Comments: Speech is clear and goal oriented, follows commands Major Cranial nerves without deficit, no facial droop Moves extremities without ataxia, coordination intact  Psychiatric:        Behavior: Behavior normal.    ED Results / Procedures / Treatments   Labs (all labs ordered are listed, but only abnormal results are displayed) Labs Reviewed  CBC WITH DIFFERENTIAL/PLATELET - Abnormal; Notable for the following components:  Result Value   RBC 5.15 (*)    All other components within normal limits  COMPREHENSIVE METABOLIC PANEL - Abnormal; Notable for the following components:   Sodium 132 (*)    Chloride 96 (*)    Glucose, Bld 227 (*)    Creatinine, Ser 1.10 (*)    Calcium 8.2 (*)    Albumin 3.1 (*)    AST 64 (*)    GFR calc non Af Amer 50 (*)    GFR calc Af Amer 58 (*)    All other components within normal limits  D-DIMER, QUANTITATIVE (NOT AT [facility]) - Abnormal; Notable for the following components:   D-Dimer, Quant 0.95 (*)    All other components within normal limits  LACTATE DEHYDROGENASE - Abnormal; Notable for the following components:   LDH 211 (*)    All other components within normal limits  FERRITIN - Abnormal; Notable for the following components:   Ferritin 419 (*)    All other components within normal limits  FIBRINOGEN - Abnormal; Notable for the following components:   Fibrinogen 633 (*)    All other components within normal limits  C-REACTIVE PROTEIN - Abnormal; Notable for the following components:   CRP 10.8 (*)    All other components within normal limits  SARS CORONAVIRUS 2 (TAT 6-24 HRS)  CULTURE, BLOOD (ROUTINE X 2)  CULTURE, BLOOD (ROUTINE X 2)  LACTIC ACID, PLASMA  LACTIC ACID, PLASMA  PROCALCITONIN  TRIGLYCERIDES    EKG None  Radiology DG Chest Port 1 View  Result Date:  12/30/2019 CLINICAL DATA:  COVID positive.  Weakness, cough EXAM: PORTABLE CHEST 1 VIEW COMPARISON:  11276 FINDINGS: Mild cardiomegaly. No confluent airspace opacities or effusions. Low lung volumes. No acute bony abnormality. IMPRESSION: Mild cardiomegaly.  Low volumes.  No confluent opacities. Electronically Signed   By: Charlett Nose M.D.   On: 12/30/2019 16:56    Procedures .Critical Care Performed by: Bill Salinas, PA-C Authorized by: Bill Salinas, PA-C   Critical care provider statement:    Critical care time (minutes):  40   Critical care was necessary to treat or prevent imminent or life-threatening deterioration of the following conditions: Acute hypoxic respiratory failure requiring supplemental oxygen due to COVID-19 virus.   Critical care was time spent personally by me on the following activities:  Discussions with consultants, evaluation of patient's response to treatment, examination of patient, ordering and performing treatments and interventions, ordering and review of laboratory studies, ordering and review of radiographic studies, pulse oximetry, re-evaluation of patient's condition, obtaining history from patient or surrogate, review of old charts and development of treatment plan with patient or surrogate   (including critical care time)  Medications Ordered in ED Medications  dexamethasone (DECADRON) injection 10 mg (has no administration in time range)  remdesivir 200 mg in sodium chloride 0.9% 250 mL IVPB (has no administration in time range)    Followed by  remdesivir 100 mg in sodium chloride 0.9 % 100 mL IVPB (has no administration in time range)    ED Course  I have reviewed the triage vital signs and the nursing notes.  Pertinent labs & imaging results that were available during my care of the patient were reviewed by me and considered in my medical decision making (see chart for details).  Clinical Course as of Dec 29 1845  Sat Dec 30, 2019  1551  Patient not in room   [BM]  1841 Hospitalist   [BM]    Clinical  Course User Index [BM] Gari Crown   MDM Rules/Calculators/A&P                     74 year old female presents today after testing positive for COVID-19 viral infection yesterday, she reports feeling ill for approximately 2-3 weeks. Her current symptoms include fatigue, body aches, nonproductive cough and shortness of breath with ambulation. She denies any chest pain, hemoptysis or current GI symptoms. She is tired appearing, nontoxic and in no acute distress. SPO2 while lying in bed having conversation is 91-93% on room air. Heart regular rate and rhythm without murmur, lungs appear slightly diminished bilaterally, abdomen soft nontender without peritoneal signs, neurovascular tact to all 4 extremities without evidence of DVT, cranial nerves intact. Will obtain COVID-19 admission order set, anticipate admission. - Lab work reviewed today, appears consistent with COVID-19 viral infection with inflammatory markers elevated.  Lactic is within normal limits.  CMP shows mild elevation of creatinine suspect secondary to dehydration.  She is started on remdesivir and Decadron will call hospitalist for admission.  Suspect symptoms today secondary to COVID-19 viral illness, doubt ACS, PE, bacterial pneumonia or other acute pathologies at this time requiring further ER work-up.  Case discussed with Dr. Wilson Singer who agrees with admission. - 6:41 PM: Discussed case with hospitalist Dr. Mindi Junker who has accepted patient for admission. - 6:43 PM: Patient reevaluated she is resting comfortably in bed no acute distress. SPO2 is 87% on room air with good waveform.  I have asked nursing staff to place patient on 2 L nasal cannula.  She denies any chest pain or difficulty breathing at this time.  Caitlin Harrington was evaluated in Emergency Department on 12/30/2019 for the symptoms described in the history of present illness. She was evaluated in the  context of the global COVID-19 pandemic, which necessitated consideration that the patient might be at risk for infection with the SARS-CoV-2 virus that causes COVID-19. Institutional protocols and algorithms that pertain to the evaluation of patients at risk for COVID-19 are in a state of rapid change based on information released by regulatory bodies including the CDC and federal and state organizations. These policies and algorithms were followed during the patient's care in the ED.  Note: Portions of this report may have been transcribed using voice recognition software. Every effort was made to ensure accuracy; however, inadvertent computerized transcription errors may still be present. Final Clinical Impression(s) / ED Diagnoses Final diagnoses:  [id number] virus infection  Hypoxia    Rx / DC Orders ED Discharge Orders    None       Gari Crown 12/30/19 1954    Virgel Manifold, MD 12/31/19 1256

## 2019-12-31 ENCOUNTER — Encounter (HOSPITAL_COMMUNITY): Payer: Self-pay | Admitting: Internal Medicine

## 2019-12-31 DIAGNOSIS — E1165 Type 2 diabetes mellitus with hyperglycemia: Secondary | ICD-10-CM

## 2019-12-31 DIAGNOSIS — J9601 Acute respiratory failure with hypoxia: Secondary | ICD-10-CM

## 2019-12-31 DIAGNOSIS — E871 Hypo-osmolality and hyponatremia: Secondary | ICD-10-CM

## 2019-12-31 DIAGNOSIS — J1282 Pneumonia due to coronavirus disease 2019: Secondary | ICD-10-CM

## 2019-12-31 LAB — BRAIN NATRIURETIC PEPTIDE: B Natriuretic Peptide: 47.3 pg/mL (ref 0.0–100.0)

## 2019-12-31 LAB — COMPREHENSIVE METABOLIC PANEL
ALT: 37 U/L (ref 0–44)
AST: 59 U/L — ABNORMAL HIGH (ref 15–41)
Albumin: 2.8 g/dL — ABNORMAL LOW (ref 3.5–5.0)
Alkaline Phosphatase: 74 U/L (ref 38–126)
Anion gap: 13 (ref 5–15)
BUN: 25 mg/dL — ABNORMAL HIGH (ref 8–23)
CO2: 22 mmol/L (ref 22–32)
Calcium: 8.3 mg/dL — ABNORMAL LOW (ref 8.9–10.3)
Chloride: 97 mmol/L — ABNORMAL LOW (ref 98–111)
Creatinine, Ser: 1.06 mg/dL — ABNORMAL HIGH (ref 0.44–1.00)
GFR calc Af Amer: >60 mL/min (ref >60)
GFR calc non Af Amer: 53 mL/min — ABNORMAL LOW (ref >60)
Glucose, Bld: 401 mg/dL — ABNORMAL HIGH (ref 70–99)
Potassium: 4.3 mmol/L (ref 3.5–5.1)
Sodium: 132 mmol/L — ABNORMAL LOW (ref 135–145)
Total Bilirubin: 0.6 mg/dL (ref 0.3–1.2)
Total Protein: 7.3 g/dL (ref 6.5–8.1)

## 2019-12-31 LAB — CBC WITH DIFFERENTIAL/PLATELET
Abs Immature Granulocytes: 0.03 10*3/uL (ref 0.00–0.07)
Basophils Absolute: 0 10*3/uL (ref 0.0–0.1)
Basophils Relative: 0 %
Eosinophils Absolute: 0 10*3/uL (ref 0.0–0.5)
Eosinophils Relative: 0 %
HCT: 41.3 % (ref 36.0–46.0)
Hemoglobin: 13.8 g/dL (ref 12.0–15.0)
Immature Granulocytes: 1 %
Lymphocytes Relative: 21 %
Lymphs Abs: 1 10*3/uL (ref 0.7–4.0)
MCH: 28.1 pg (ref 26.0–34.0)
MCHC: 33.4 g/dL (ref 30.0–36.0)
MCV: 84.1 fL (ref 80.0–100.0)
Monocytes Absolute: 0.2 10*3/uL (ref 0.1–1.0)
Monocytes Relative: 5 %
Neutro Abs: 3.4 10*3/uL (ref 1.7–7.7)
Neutrophils Relative %: 73 %
Platelets: 209 10*3/uL (ref 150–400)
RBC: 4.91 MIL/uL (ref 3.87–5.11)
RDW: 12.8 % (ref 11.5–15.5)
WBC: 4.6 10*3/uL (ref 4.0–10.5)
nRBC: 0 % (ref 0.0–0.2)

## 2019-12-31 LAB — SARS CORONAVIRUS 2 (TAT 6-24 HRS): SARS Coronavirus 2: POSITIVE — AB

## 2019-12-31 LAB — GLUCOSE, CAPILLARY
Glucose-Capillary: 379 mg/dL — ABNORMAL HIGH (ref 70–99)
Glucose-Capillary: 391 mg/dL — ABNORMAL HIGH (ref 70–99)
Glucose-Capillary: 412 mg/dL — ABNORMAL HIGH (ref 70–99)
Glucose-Capillary: 340 mg/dL — ABNORMAL HIGH (ref 70–99)

## 2019-12-31 LAB — D-DIMER, QUANTITATIVE: D-Dimer, Quant: 0.93 ug/mL-FEU — ABNORMAL HIGH (ref 0.00–0.50)

## 2019-12-31 LAB — MAGNESIUM: Magnesium: 2 mg/dL (ref 1.7–2.4)

## 2019-12-31 LAB — PHOSPHORUS: Phosphorus: 4 mg/dL (ref 2.5–4.6)

## 2019-12-31 LAB — FERRITIN: Ferritin: 423 ng/mL — ABNORMAL HIGH (ref 11–307)

## 2019-12-31 LAB — C-REACTIVE PROTEIN: CRP: 13.4 mg/dL — ABNORMAL HIGH (ref <1.0)

## 2019-12-31 LAB — ABO/RH: ABO/RH(D): O POS

## 2019-12-31 MED ORDER — SODIUM CHLORIDE 0.9% FLUSH
3.0000 mL | Freq: Two times a day (BID) | INTRAVENOUS | Status: DC
  Administered 2019-12-31 – 2020-01-04 (×9): 3 mL via INTRAVENOUS

## 2019-12-31 MED ORDER — LINAGLIPTIN 5 MG PO TABS
5.0000 mg | ORAL_TABLET | Freq: Every day | ORAL | Status: DC
  Administered 2019-12-31 – 2020-01-04 (×5): 5 mg via ORAL
  Filled 2019-12-31 (×5): qty 1

## 2019-12-31 MED ORDER — NAPHAZOLINE-GLYCERIN 0.012-0.2 % OP SOLN
1.0000 [drp] | Freq: Four times a day (QID) | OPHTHALMIC | Status: DC | PRN
  Filled 2019-12-31: qty 15

## 2019-12-31 MED ORDER — INSULIN DETEMIR 100 UNIT/ML ~~LOC~~ SOLN
10.0000 [IU] | Freq: Two times a day (BID) | SUBCUTANEOUS | Status: DC
  Administered 2019-12-31: 10 [IU] via SUBCUTANEOUS
  Filled 2019-12-31 (×2): qty 0.1

## 2019-12-31 MED ORDER — INSULIN ASPART 100 UNIT/ML ~~LOC~~ SOLN
0.0000 [IU] | Freq: Three times a day (TID) | SUBCUTANEOUS | Status: DC
  Administered 2019-12-31: 15 [IU] via SUBCUTANEOUS

## 2019-12-31 MED ORDER — ASCORBIC ACID 500 MG PO TABS
500.0000 mg | ORAL_TABLET | Freq: Every day | ORAL | Status: DC
  Administered 2019-12-31 – 2020-01-04 (×5): 500 mg via ORAL
  Filled 2019-12-31 (×5): qty 1

## 2019-12-31 MED ORDER — POLYETHYLENE GLYCOL 3350 17 G PO PACK: 17.0000 g | PACK | Freq: Every day | ORAL | Status: DC | PRN

## 2019-12-31 MED ORDER — TERAZOSIN HCL 2 MG PO CAPS
4.0000 mg | ORAL_CAPSULE | Freq: Every day | ORAL | Status: DC
  Administered 2019-12-31 (×2): 4 mg via ORAL
  Filled 2019-12-31 (×5): qty 2

## 2019-12-31 MED ORDER — ALBUTEROL SULFATE HFA 108 (90 BASE) MCG/ACT IN AERS
2.0000 | INHALATION_SPRAY | Freq: Four times a day (QID) | RESPIRATORY_TRACT | Status: DC | PRN
  Administered 2019-12-31: 2 via RESPIRATORY_TRACT
  Filled 2019-12-31: qty 6.7

## 2019-12-31 MED ORDER — ONDANSETRON HCL 4 MG PO TABS: 4.0000 mg | ORAL_TABLET | Freq: Four times a day (QID) | ORAL | Status: DC | PRN

## 2019-12-31 MED ORDER — INSULIN DETEMIR 100 UNIT/ML ~~LOC~~ SOLN
20.0000 [IU] | Freq: Two times a day (BID) | SUBCUTANEOUS | Status: DC
  Administered 2019-12-31 – 2020-01-01 (×2): 20 [IU] via SUBCUTANEOUS
  Filled 2019-12-31 (×3): qty 0.2

## 2019-12-31 MED ORDER — FAMOTIDINE 20 MG PO TABS
20.0000 mg | ORAL_TABLET | Freq: Every day | ORAL | Status: DC
  Administered 2019-12-31 – 2020-01-04 (×5): 20 mg via ORAL
  Filled 2019-12-31 (×5): qty 1

## 2019-12-31 MED ORDER — INSULIN ASPART 100 UNIT/ML ~~LOC~~ SOLN
25.0000 [IU] | Freq: Once | SUBCUTANEOUS | Status: AC
  Administered 2019-12-31: 25 [IU] via SUBCUTANEOUS

## 2019-12-31 MED ORDER — INSULIN ASPART 100 UNIT/ML ~~LOC~~ SOLN
0.0000 [IU] | Freq: Three times a day (TID) | SUBCUTANEOUS | Status: DC
  Administered 2019-12-31: 20 [IU] via SUBCUTANEOUS
  Administered 2020-01-01: 11 [IU] via SUBCUTANEOUS
  Administered 2020-01-01: 15 [IU] via SUBCUTANEOUS
  Administered 2020-01-01 – 2020-01-02 (×2): 7 [IU] via SUBCUTANEOUS
  Administered 2020-01-04: 4 [IU] via SUBCUTANEOUS

## 2019-12-31 MED ORDER — HYDROCOD POLST-CPM POLST ER 10-8 MG/5ML PO SUER: 5.0000 mL | Freq: Two times a day (BID) | ORAL | Status: DC | PRN

## 2019-12-31 MED ORDER — BISACODYL 10 MG RE SUPP: 10.0000 mg | Freq: Every day | RECTAL | Status: DC | PRN

## 2019-12-31 MED ORDER — ENOXAPARIN SODIUM 60 MG/0.6ML ~~LOC~~ SOLN
45.0000 mg | SUBCUTANEOUS | Status: DC
  Administered 2020-01-01 – 2020-01-04 (×4): 45 mg via SUBCUTANEOUS
  Filled 2019-12-31 (×5): qty 0.6

## 2019-12-31 MED ORDER — ALLOPURINOL 100 MG PO TABS
300.0000 mg | ORAL_TABLET | Freq: Every day | ORAL | Status: DC
  Administered 2019-12-31 – 2020-01-04 (×5): 300 mg via ORAL
  Filled 2019-12-31 (×5): qty 3

## 2019-12-31 MED ORDER — NAPHAZOLINE-PHENIRAMINE 0.025-0.3 % OP SOLN
1.0000 [drp] | Freq: Four times a day (QID) | OPHTHALMIC | Status: DC | PRN
  Administered 2019-12-31: 2 [drp] via OPHTHALMIC
  Filled 2019-12-31: qty 5

## 2019-12-31 MED ORDER — GUAIFENESIN-DM 100-10 MG/5ML PO SYRP: 10.0000 mL | ORAL_SOLUTION | ORAL | Status: DC | PRN

## 2019-12-31 MED ORDER — DEXAMETHASONE SODIUM PHOSPHATE 10 MG/ML IJ SOLN: 6.0000 mg | INTRAMUSCULAR | Status: DC

## 2019-12-31 MED ORDER — INSULIN ASPART 100 UNIT/ML ~~LOC~~ SOLN
6.0000 [IU] | Freq: Three times a day (TID) | SUBCUTANEOUS | Status: DC
  Administered 2019-12-31 – 2020-01-04 (×7): 6 [IU] via SUBCUTANEOUS

## 2019-12-31 MED ORDER — PRAVASTATIN SODIUM 10 MG PO TABS
20.0000 mg | ORAL_TABLET | Freq: Every day | ORAL | Status: DC
  Administered 2019-12-31 – 2020-01-04 (×5): 20 mg via ORAL
  Filled 2019-12-31 (×5): qty 2

## 2019-12-31 MED ORDER — COLCHICINE 0.6 MG PO TABS
0.6000 mg | ORAL_TABLET | Freq: Every day | ORAL | Status: DC
  Administered 2019-12-31 – 2020-01-01 (×2): 0.6 mg via ORAL
  Filled 2019-12-31 (×2): qty 1

## 2019-12-31 MED ORDER — ONDANSETRON HCL 4 MG/2ML IJ SOLN: 4.0000 mg | Freq: Four times a day (QID) | INTRAMUSCULAR | Status: DC | PRN

## 2019-12-31 MED ORDER — COLESEVELAM HCL 625 MG PO TABS
625.0000 mg | ORAL_TABLET | Freq: Every day | ORAL | Status: DC
  Administered 2019-12-31 – 2020-01-01 (×2): 625 mg via ORAL
  Filled 2019-12-31 (×2): qty 1

## 2019-12-31 MED ORDER — ENOXAPARIN SODIUM 40 MG/0.4ML ~~LOC~~ SOLN
40.0000 mg | SUBCUTANEOUS | Status: DC
  Administered 2019-12-31: 40 mg via SUBCUTANEOUS
  Filled 2019-12-31: qty 0.4

## 2019-12-31 MED ORDER — INSULIN ASPART 100 UNIT/ML ~~LOC~~ SOLN
0.0000 [IU] | Freq: Every day | SUBCUTANEOUS | Status: DC
  Administered 2019-12-31: 4 [IU] via SUBCUTANEOUS
  Administered 2020-01-01 – 2020-01-02 (×2): 2 [IU] via SUBCUTANEOUS

## 2019-12-31 MED ORDER — AMLODIPINE BESYLATE 5 MG PO TABS
5.0000 mg | ORAL_TABLET | Freq: Every day | ORAL | Status: DC
  Administered 2019-12-31 – 2020-01-02 (×3): 5 mg via ORAL
  Filled 2019-12-31 (×3): qty 1

## 2019-12-31 MED ORDER — ZINC SULFATE 220 (50 ZN) MG PO CAPS
220.0000 mg | ORAL_CAPSULE | Freq: Every day | ORAL | Status: DC
  Administered 2019-12-31 – 2020-01-04 (×5): 220 mg via ORAL
  Filled 2019-12-31 (×5): qty 1

## 2019-12-31 NOTE — Progress Notes (Signed)
PROGRESS NOTE  Caitlin Harrington GYK:[id number] DOB: [birth year] DOA: 12/30/2019  PCP: Ileana Ladd, MD  Brief History/Interval Summary: 74 y.o. female with medical history significant for obesity, hypertension, diabetes gout, hypercholesteremia, asthma, who presented with weakness, shortness of breath, found to have Covid-19.  Chest x-ray was not impressive.  Clinically she was thought to have pneumonia.  Due to her respiratory symptoms and the fact that she is obese which places her at high risk for decompensation, she was hospitalized for further management.   Reason for Visit: Pneumonia due to COVID-19  Consultants: None  Procedures: None  Antibiotics: Anti-infectives (From admission, onward)   Start     Dose/Rate Route Frequency Ordered Stop   12/31/19 1000  remdesivir 100 mg in sodium chloride 0.9 % 100 mL IVPB     100 mg 200 mL/hr over 30 Minutes Intravenous Daily 12/30/19 1844 01/04/20 0959   12/30/19 1915  remdesivir 200 mg in sodium chloride 0.9% 250 mL IVPB     200 mg 580 mL/hr over 30 Minutes Intravenous Once 12/30/19 1844 12/30/19 2038      Subjective/Interval History: Patient continues to have some cough which is dry for the most part.  Occasional shortness of breath.  Denies any nausea vomiting this morning.  No abdominal pain.    Assessment/Plan:  Pneumonia due to COVID-19/acute respiratory failure with hypoxia  Recent Labs  Lab 12/30/19 1715 12/31/19 0413  DDIMER 0.95* 0.93*  FERRITIN 419* 423*  CRP 10.8* 13.4*  ALT 36 37  PROCALCITON <0.10  --     Objective findings: Fever: Afebrile Oxygen requirements: Required oxygen overnight.  Currently on 2 L.  Saturating in the early 90s.  COVID 19 Therapeutics: Antibacterials: None Remdesivir: Day two Steroids: Dexamethasone Diuretics: None Actemra: Not given yet Convalescent Plasma: Not given yet Vitamin C and Zinc: Continue PUD Prophylaxis: Initiate Pepcid DVT Prophylaxis:  Lovenox   From a  respiratory standpoint patient is stable.  She was initially saturating normal on room air but overnight she is requiring oxygen.  Continue with remdesivir and steroids.  Inflammatory markers noted to be elevated however patient appears to have minimal oxygen requirements currently.  Continue to monitor for now.  If patient's oxygen requirements climb then we may have to offer her convalescent plasma and Actemra.  Mobilize.  Out of bed to chair.  Incentive spirometry.  Continue to trend inflammatory markers.  Procalcitonin was less than 0.1.  Hold off on antibacterials.  Diabetes mellitus type 2, uncontrolled with hyperglycemia Elevated CBGs due to steroids.  No recent HbA1c in the system.  Will check .  Home medication list does not show any glucose lowering agents.  Will need to verify with patient.  Change SSI to resistant.  Add meal coverage.  Add long-acting insulin.  Essential hypertension On amlodipine.  Monitor blood pressures closely.  History of gout On allopurinol and colchicine at home which is being continued.  Hyperlipidemia Continue statin.  Hyponatremia Due to hypovolemia.  Encourage oral intake.  Follow labs.  Obesity Estimated body mass index is 37.87 kg/m as calculated from the following:   Height as of this encounter: 5' (1.524 m).   Weight as of this encounter: 88 kg.   DVT Prophylaxis: Lovenox Code Status: Full code Family Communication: Discussed with the patient.  She requests that I call her granddaughter. Disposition Plan: Mobilize.  From home.  Hopefully will be able to return there once improved.   Medications:  Scheduled: . allopurinol  300 mg Oral  Daily  . amLODipine  5 mg Oral Daily  . vitamin C  500 mg Oral Daily  . colchicine  0.6 mg Oral Daily  . colesevelam  625 mg Oral Daily  . dexamethasone (DECADRON) injection  6 mg Intravenous Q24H  . enoxaparin (LOVENOX) injection  40 mg Subcutaneous Q24H  . insulin aspart  0-15 Units Subcutaneous TID WC   . linagliptin  5 mg Oral Daily  . pravastatin  20 mg Oral Daily  . sodium chloride flush  3 mL Intravenous Q12H  . terazosin  4 mg Oral QHS  . zinc sulfate  220 mg Oral Daily   Continuous: . remdesivir 100 mg in NS 100 mL 100 mg (12/31/19 0852)   MWU:XLKGMWNUU, bisacodyl, chlorpheniramine-HYDROcodone, guaiFENesin-dextromethorphan, ondansetron **OR** ondansetron (ZOFRAN) IV, polyethylene glycol   Objective:  Vital Signs  Vitals:   12/31/19 0033 12/31/19 0411 12/31/19 0628 12/31/19 0737  BP: (!) 155/84 (!) 150/104 (!) 144/97 119/70  Pulse: 76 75 62 63  Resp: 20 19 20 20   Temp: 98 F (36.7 C) (!) 96.4 F (35.8 C)  (!) 97.1 F (36.2 C)  TempSrc: Oral Axillary  Oral  SpO2: 96% 93% 90% 95%  Weight: 88 kg     Height: 5' (1.524 m)       Intake/Output Summary (Last 24 hours) at 12/31/2019 1025 Last data filed at 12/31/2019 1007 Gross per 24 hour  Intake 125.38 ml  Output -  Net 125.38 ml   Filed Weights   12/31/19 0033  Weight: 88 kg    General appearance: Awake alert.  In no distress.  Obese Resp: Normal effort at rest.  Diminished air entry at the bases with crackles.  No wheezing or rhonchi. Cardio: S1-S2 is normal regular.  No S3-S4.  No rubs murmurs or bruit GI: Abdomen is soft.  Nontender nondistended.  Bowel sounds are present normal.  No masses organomegaly Extremities: No edema.  Full range of motion of lower extremities. Neurologic: Alert and oriented x3.  No focal neurological deficits.    Lab Results:  Data Reviewed: I have personally reviewed following labs and imaging studies  CBC: Recent Labs  Lab 12/30/19 1715 12/31/19 0413  WBC 7.6 4.6  NEUTROABS 5.7 3.4  HGB 14.7 13.8  HCT 43.6 41.3  MCV 84.7 84.1  PLT 204 725    Basic Metabolic Panel: Recent Labs  Lab 12/30/19 1715 12/31/19 0413  NA 132* 132*  K 3.8 4.3  CL 96* 97*  CO2 23 22  GLUCOSE 227* 401*  BUN 18 25*  CREATININE 1.10* 1.06*  CALCIUM 8.2* 8.3*  MG  --  2.0  PHOS  --  4.0     GFR: Estimated Creatinine Clearance: 48 mL/min (A) (by C-G formula based on SCr of 1.06 mg/dL (H)).  Liver Function Tests: Recent Labs  Lab 12/30/19 1715 12/31/19 0413  AST 64* 59*  ALT 36 37  ALKPHOS 75 74  BILITOT 1.2 0.6  PROT 7.8 7.3  ALBUMIN 3.1* 2.8*    CBG: Recent Labs  Lab 12/31/19 0739  GLUCAP 379*    Lipid Profile: Recent Labs    12/30/19 1715  TRIG 45     Anemia Panel: Recent Labs    12/30/19 1715 12/31/19 0413  FERRITIN 419* 423*    Recent Results (from the past 240 hour(s))  SARS CORONAVIRUS 2 (TAT 6-24 HRS) Nasopharyngeal Nasopharyngeal Swab     Status: Abnormal   Collection Time: 12/30/19  4:51 PM   Specimen: Nasopharyngeal Swab  Result Value Ref Range Status   SARS Coronavirus 2 POSITIVE (A) NEGATIVE Final    Comment: EMAILED LORI B. AT 0110 ON 12/31/2019 BY MESSAN H.  (NOTE) SARS-CoV-2 target nucleic acids are DETECTED. The SARS-CoV-2 RNA is generally detectable in upper and lower respiratory specimens during the acute phase of infection. Positive results are indicative of the presence of SARS-CoV-2 RNA. Clinical correlation with patient history and other diagnostic information is  necessary to determine patient infection status. Positive results do not rule out bacterial infection or co-infection with other viruses.  The expected result is Negative. Fact Sheet for Patients: HairSlick.no Fact Sheet for Healthcare Providers: quierodirigir.com This test is not yet approved or cleared by the [place] FDA and  has been authorized for detection and/or diagnosis of SARS-CoV-2 by FDA under an Emergency Use Authorization (EUA). This EUA will remain  in effect (meaning this test can be used) for the duration of the COVID-19 declaratio n under Section 564(b)(1) of the Act, 21 U.S.C. section 360bbb-3(b)(1), unless the authorization is terminated or revoked sooner. Performed at [facility], [address], [place], [place] [address]   Blood Culture (routine x 2)     Status: None (Preliminary result)   Collection Time: 12/30/19  4:51 PM   Specimen: BLOOD LEFT HAND  Result Value Ref Range Status   Specimen Description BLOOD LEFT HAND  Final   Special Requests   Final    BOTTLES DRAWN AEROBIC AND ANAEROBIC Blood Culture adequate volume Performed at [facility], [address], [place], [place] [address]    Culture NO GROWTH < 12 HOURS  Final   Report Status PENDING  Incomplete  Blood Culture (routine x 2)     Status: None (Preliminary result)   Collection Time: 12/30/19  5:43 PM   Specimen: BLOOD RIGHT HAND  Result Value Ref Range Status   Specimen Description BLOOD RIGHT HAND  Final   Special Requests   Final    BOTTLES DRAWN AEROBIC AND ANAEROBIC Blood Culture results may not be optimal due to an excessive volume of blood received in culture bottles Performed at [facility], [address] [place], [place] [address]    Culture NO GROWTH < 12 HOURS  Final   Report Status PENDING  Incomplete      Radiology Studies: DG Chest Port 1 View  Result Date: 12/30/2019 CLINICAL DATA:  COVID positive.  Weakness, cough EXAM: PORTABLE CHEST 1 VIEW COMPARISON:  [id number] FINDINGS: Mild cardiomegaly. No confluent airspace opacities or effusions. Low lung volumes. No acute bony abnormality. IMPRESSION: Mild cardiomegaly.  Low volumes.  No confluent opacities. Electronically Signed   By: Charlett Nose M.D.   On: 12/30/2019 16:56       LOS: 1 day   Reizy Dunlow  [facility] Pager on [email or web address]  12/31/2019, 10:25 AM

## 2019-12-31 NOTE — Progress Notes (Signed)
Pt arrived at room 319 stable with RN christina. Report given, vitals, assessement and orders completed. Will continuo to monitor.

## 2019-12-31 NOTE — ED Notes (Signed)
[facility] at bedside to transport to [facility].

## 2019-12-31 NOTE — Progress Notes (Signed)
Pharmacy: Lovenox Dose Adjustment for VTE prophylaxis w/ COVID  OBJECTIVE:  Wt: 88 kg   Ht: 5'  BMI~38 SCr 1.06  CrCl~50 ml/min D-dimer 0.93 (<5)  ASSESSMENT:  73 YOF on lovenox for VTE prophylaxis in the setting of COVID requiring a dose adjustment for BMI>35 and CrCl>30 ml/min  PLAN:  1. Adjust Lovenox to 45 mg (~0.5 mg/kg) SQ every 24 hours 2. Pharmacy will sign off but continue to monitor peripherally for s/sx of bleeding and any necessary dose adjustments   Thank you for allowing pharmacy to be a part of this patient's care.  Georgina Pillion, PharmD, BCPS Clinical Pharmacist 12/31/2019 10:45 AM   **Pharmacist phone directory can now be found on [email or web address] ([email or web address]).  Listed under [facility].

## 2020-01-01 LAB — CBC WITH DIFFERENTIAL/PLATELET
Abs Immature Granulocytes: 0.06 10*3/uL (ref 0.00–0.07)
Basophils Absolute: 0 10*3/uL (ref 0.0–0.1)
Basophils Relative: 0 %
Eosinophils Absolute: 0 10*3/uL (ref 0.0–0.5)
Eosinophils Relative: 0 %
HCT: 41.9 % (ref 36.0–46.0)
Hemoglobin: 14.4 g/dL (ref 12.0–15.0)
Immature Granulocytes: 1 %
Lymphocytes Relative: 17 %
Lymphs Abs: 1.4 10*3/uL (ref 0.7–4.0)
MCH: 28.3 pg (ref 26.0–34.0)
MCHC: 34.4 g/dL (ref 30.0–36.0)
MCV: 82.3 fL (ref 80.0–100.0)
Monocytes Absolute: 0.8 10*3/uL (ref 0.1–1.0)
Monocytes Relative: 10 %
Neutro Abs: 5.9 10*3/uL (ref 1.7–7.7)
Neutrophils Relative %: 72 %
Platelets: 236 10*3/uL (ref 150–400)
RBC: 5.09 MIL/uL (ref 3.87–5.11)
RDW: 12.7 % (ref 11.5–15.5)
WBC: 8.1 10*3/uL (ref 4.0–10.5)
nRBC: 0 % (ref 0.0–0.2)

## 2020-01-01 LAB — COMPREHENSIVE METABOLIC PANEL
ALT: 42 U/L (ref 0–44)
AST: 55 U/L — ABNORMAL HIGH (ref 15–41)
Albumin: 2.5 g/dL — ABNORMAL LOW (ref 3.5–5.0)
Alkaline Phosphatase: 71 U/L (ref 38–126)
Anion gap: 10 (ref 5–15)
BUN: 41 mg/dL — ABNORMAL HIGH (ref 8–23)
CO2: 23 mmol/L (ref 22–32)
Calcium: 8.7 mg/dL — ABNORMAL LOW (ref 8.9–10.3)
Chloride: 99 mmol/L (ref 98–111)
Creatinine, Ser: 1.09 mg/dL — ABNORMAL HIGH (ref 0.44–1.00)
GFR calc Af Amer: 59 mL/min — ABNORMAL LOW (ref >60)
GFR calc non Af Amer: 51 mL/min — ABNORMAL LOW (ref >60)
Glucose, Bld: 265 mg/dL — ABNORMAL HIGH (ref 70–99)
Potassium: 4.2 mmol/L (ref 3.5–5.1)
Sodium: 132 mmol/L — ABNORMAL LOW (ref 135–145)
Total Bilirubin: 1 mg/dL (ref 0.3–1.2)
Total Protein: 7 g/dL (ref 6.5–8.1)

## 2020-01-01 LAB — GLUCOSE, CAPILLARY
Glucose-Capillary: 282 mg/dL — ABNORMAL HIGH (ref 70–99)
Glucose-Capillary: 325 mg/dL — ABNORMAL HIGH (ref 70–99)
Glucose-Capillary: 246 mg/dL — ABNORMAL HIGH (ref 70–99)

## 2020-01-01 LAB — C-REACTIVE PROTEIN: CRP: 9 mg/dL — ABNORMAL HIGH (ref <1.0)

## 2020-01-01 LAB — FERRITIN: Ferritin: 548 ng/mL — ABNORMAL HIGH (ref 11–307)

## 2020-01-01 LAB — D-DIMER, QUANTITATIVE: D-Dimer, Quant: 1.81 ug/mL-FEU — ABNORMAL HIGH (ref 0.00–0.50)

## 2020-01-01 LAB — MAGNESIUM: Magnesium: 2.3 mg/dL (ref 1.7–2.4)

## 2020-01-01 MED ORDER — INSULIN DETEMIR 100 UNIT/ML ~~LOC~~ SOLN
24.0000 [IU] | Freq: Two times a day (BID) | SUBCUTANEOUS | Status: DC
  Administered 2020-01-01 – 2020-01-03 (×4): 24 [IU] via SUBCUTANEOUS
  Filled 2020-01-01 (×5): qty 0.24

## 2020-01-01 MED ORDER — INSULIN DETEMIR 100 UNIT/ML ~~LOC~~ SOLN
4.0000 [IU] | Freq: Once | SUBCUTANEOUS | Status: AC
  Administered 2020-01-01: 4 [IU] via SUBCUTANEOUS
  Filled 2020-01-01: qty 0.04

## 2020-01-01 MED ORDER — LOPERAMIDE HCL 2 MG PO CAPS
4.0000 mg | ORAL_CAPSULE | Freq: Once | ORAL | Status: AC
  Administered 2020-01-01: 4 mg via ORAL
  Filled 2020-01-01: qty 2

## 2020-01-01 MED ORDER — GLUCERNA SHAKE PO LIQD
237.0000 mL | Freq: Three times a day (TID) | ORAL | Status: DC
  Administered 2020-01-04: 10:00:00 237 mL via ORAL

## 2020-01-01 MED ORDER — ENSURE ENLIVE PO LIQD: 237.0000 mL | Freq: Three times a day (TID) | ORAL | Status: DC

## 2020-01-01 NOTE — Progress Notes (Signed)
Inpatient Diabetes Program Recommendations  AACE/ADA: New Consensus Statement on Inpatient Glycemic Control (2015)  Target Ranges:  Prepandial:   less than 140 mg/dL      Peak postprandial:   less than 180 mg/dL (1-2 hours)      Critically ill patients:  140 - 180 mg/dL   Lab Results  Component Value Date   GLUCAP 325 (H) 01/01/2020   HGBA1C 6.8 09/18/2013    Review of Glycemic Control Results for Caitlin Harrington, Caitlin Harrington (MRN [id number]) as of 01/01/2020 14:35  Ref. Range 12/31/2019 11:31 12/31/2019 16:44 12/31/2019 21:41 01/01/2020 08:06 01/01/2020 12:54  Glucose-Capillary Latest Ref Range: 70 - 99 mg/dL 518 (H) 343 (H) 735 (H) 282 (H) 325 (H)   Diabetes history: DM2 Outpatient Diabetes medications: No current meds (has been on Metformin in the past) Current orders for Inpatient glycemic control: Levemir 24 units bid + Novolog 6 units tid meal coverage + Novolog resistant correction tid + hs 0-5 units + Tradjenta 5 mg qd  Inpatient Diabetes Program Recommendations:   Noted pending A1c Levemir increased to 24 units bid today.  MD please consider: -Increase Novolog meal coverage to 8-10 units tid if eats 50%  Thank you, Billy Fischer. Fahima Cifelli, RN, MSN, CDE  Diabetes Coordinator Inpatient Glycemic Control Team Team Pager [phone] (8am-5pm) 01/01/2020 2:40 PM

## 2020-01-01 NOTE — Progress Notes (Signed)
PROGRESS NOTE  Caitlin Harrington GUR:[id number] DOB: 1946-05-01 DOA: 12/30/2019  PCP: Vernie Shanks, MD  Brief History/Interval Summary: 74 y.o. female with medical history significant for obesity, hypertension, diabetes gout, hypercholesteremia, asthma, who presented with weakness, shortness of breath, found to have Covid-19.  Chest x-ray was not impressive.  Clinically she was thought to have pneumonia.  Due to her respiratory symptoms and the fact that she is obese which places her at high risk for decompensation, she was hospitalized for further management.   Reason for Visit: Pneumonia due to COVID-19  Consultants: None  Procedures: None  Antibiotics: Anti-infectives (From admission, onward)   Start     Dose/Rate Route Frequency Ordered Stop   12/31/19 1000  remdesivir 100 mg in sodium chloride 0.9 % 100 mL IVPB     100 mg 200 mL/hr over 30 Minutes Intravenous Daily 12/30/19 1844 01/04/20 0959   12/30/19 1915  remdesivir 200 mg in sodium chloride 0.9% 250 mL IVPB     200 mg 580 mL/hr over 30 Minutes Intravenous Once 12/30/19 1844 12/30/19 2038      Subjective/Interval History: Patient states that she is feeling slightly better.  Not as short of breath as before.  Denies any nausea vomiting.  Occasional dry cough.    Assessment/Plan:  Pneumonia due to COVID-19/acute respiratory failure with hypoxia  Recent Labs  Lab 12/30/19 1715 12/31/19 0413 01/01/20 0550  DDIMER 0.95* 0.93* 1.81*  FERRITIN 419* 423* 548*  CRP 10.8* 13.4* 9.0*  ALT 36 37 42  PROCALCITON <0.10  --   --     Objective findings: Fever: Remains afebrile Oxygen requirements: Oxygen by nasal cannula at 2 L.  Saturating in the early 90s.    COVID 19 Therapeutics: Antibacterials: None Remdesivir: Day 3 Steroids: Dexamethasone Diuretics: None Actemra: Not given yet Convalescent Plasma: Not given yet Vitamin C and Zinc: Continue PUD Prophylaxis: Pepcid DVT Prophylaxis:  Lovenox   Patient is  stable from a respiratory standpoint.  She remains on remdesivir and steroids.  Inflammatory markers slightly improved today with CRP down to 9.  D-dimer noted to be slightly higher today at 1.81.  Continue to trend.  Procalcitonin was less than 0.1.  Continue with incentive spirometry.  She is obese and so at risk for decompensation.  She needs to be mobilized.  PT and OT.  Diabetes mellitus type 2, uncontrolled with hyperglycemia HbA1c is pending.  Patient apparently used to be on Metformin but has not taken it in 4 months time.  Continue to monitor CBGs.  Increase dose of Levemir for better control.  Continue SSI.   Essential hypertension On amlodipine.  Blood pressure is reasonably well controlled.  History of gout Apparently has not been on allopurinol or colchicine in many months.  We will stop the colchicine so avoid side effects.  Okay to continue allopurinol for now.  Check uric acid level tomorrow morning.  Hyperlipidemia Continue statin.  Hyponatremia Due to hypovolemia.  Low but stable.  Continue to monitor.  Obesity Estimated body mass index is 37.87 kg/m as calculated from the following:   Height as of this encounter: 5' (1.524 m).   Weight as of this encounter: 88 kg.   DVT Prophylaxis: Lovenox Code Status: Full code Family Communication: Unable to get in touch with her granddaughter yesterday.  We will try to do so again today. Disposition Plan: Mobilize.  She is from home.  Therapy thinks she may need skilled nursing facility depending on her progress.  Continue to monitor.    Medications:  Scheduled: . allopurinol  300 mg Oral Daily  . amLODipine  5 mg Oral Daily  . vitamin C  500 mg Oral Daily  . colchicine  0.6 mg Oral Daily  . colesevelam  625 mg Oral Daily  . dexamethasone (DECADRON) injection  6 mg Intravenous Q24H  . enoxaparin (LOVENOX) injection  45 mg Subcutaneous Q24H  . famotidine  20 mg Oral Daily  . insulin aspart  0-20 Units Subcutaneous TID WC    . insulin aspart  0-5 Units Subcutaneous QHS  . insulin aspart  6 Units Subcutaneous TID WC  . insulin detemir  20 Units Subcutaneous BID  . linagliptin  5 mg Oral Daily  . pravastatin  20 mg Oral Daily  . sodium chloride flush  3 mL Intravenous Q12H  . terazosin  4 mg Oral QHS  . zinc sulfate  220 mg Oral Daily   Continuous: . remdesivir 100 mg in NS 100 mL 100 mg (01/01/20 0906)   KZL:DJTTSVXBL, bisacodyl, chlorpheniramine-HYDROcodone, guaiFENesin-dextromethorphan, naphazoline-pheniramine, ondansetron **OR** ondansetron (ZOFRAN) IV, polyethylene glycol   Objective:  Vital Signs  Vitals:   12/31/19 2000 01/01/20 0347 01/01/20 0745 01/01/20 0800  BP: 130/75 130/75 124/75 123/69  Pulse: 76  66 64  Resp: 16 18 18 18   Temp: 97.6 F (36.4 C) 97.9 F (36.6 C)  98 F (36.7 C)  TempSrc: Oral Oral  Axillary  SpO2: 95% 96% 94% 93%  Weight:      Height:        Intake/Output Summary (Last 24 hours) at 01/01/2020 1219 Last data filed at 01/01/2020 1100 Gross per 24 hour  Intake 960 ml  Output 0 ml  Net 960 ml   Filed Weights   12/31/19 0033  Weight: 88 kg    General appearance: Awake alert.  In no distress.  Obese Resp: Normal effort at rest.  Crackles bilateral bases.  No wheezing or rhonchi.   Cardio: S1-S2 is normal regular.  No S3-S4.  No rubs murmurs or bruit GI: Abdomen is soft.  Nontender nondistended.  Bowel sounds are present normal.  No masses organomegaly Extremities: No edema.  Full range of motion of lower extremities. Neurologic: Alert and oriented x3.  No focal neurological deficits.    Lab Results:  Data Reviewed: I have personally reviewed following labs and imaging studies  CBC: Recent Labs  Lab 12/30/19 1715 12/31/19 0413 01/01/20 0550  WBC 7.6 4.6 8.1  NEUTROABS 5.7 3.4 5.9  HGB 14.7 13.8 14.4  HCT 43.6 41.3 41.9  MCV 84.7 84.1 82.3  PLT 204 209 236    Basic Metabolic Panel: Recent Labs  Lab 12/30/19 1715 12/31/19 0413 01/01/20 0550   NA 132* 132* 132*  K 3.8 4.3 4.2  CL 96* 97* 99  CO2 23 22 23   GLUCOSE 227* 401* 265*  BUN 18 25* 41*  CREATININE 1.10* 1.06* 1.09*  CALCIUM 8.2* 8.3* 8.7*  MG  --  2.0 2.3  PHOS  --  4.0  --     GFR: Estimated Creatinine Clearance: 46.7 mL/min (A) (by C-G formula based on SCr of 1.09 mg/dL (H)).  Liver Function Tests: Recent Labs  Lab 12/30/19 1715 12/31/19 0413 01/01/20 0550  AST 64* 59* 55*  ALT 36 37 42  ALKPHOS 75 74 71  BILITOT 1.2 0.6 1.0  PROT 7.8 7.3 7.0  ALBUMIN 3.1* 2.8* 2.5*    CBG: Recent Labs  Lab 12/31/19 0739 12/31/19 1131 12/31/19 1644  12/31/19 2141 01/01/20 0806  GLUCAP 379* 391* 412* 340* 282*    Lipid Profile: Recent Labs    12/30/19 1715  TRIG 45     Anemia Panel: Recent Labs    12/31/19 0413 01/01/20 0550  FERRITIN 423* 548*    Recent Results (from the past 240 hour(s))  SARS CORONAVIRUS 2 (TAT 6-24 HRS) Nasopharyngeal Nasopharyngeal Swab     Status: Abnormal   Collection Time: 12/30/19  4:51 PM   Specimen: Nasopharyngeal Swab  Result Value Ref Range Status   SARS Coronavirus 2 POSITIVE (A) NEGATIVE Final    Comment: EMAILED LORI B. AT 0110 ON 12/31/2019 BY MESSAN H.  (NOTE) SARS-CoV-2 target nucleic acids are DETECTED. The SARS-CoV-2 RNA is generally detectable in upper and lower respiratory specimens during the acute phase of infection. Positive results are indicative of the presence of SARS-CoV-2 RNA. Clinical correlation with patient history and other diagnostic information is  necessary to determine patient infection status. Positive results do not rule out bacterial infection or co-infection with other viruses.  The expected result is Negative. Fact Sheet for Patients: HairSlick.no Fact Sheet for Healthcare Providers: quierodirigir.com This test is not yet approved or cleared by the [place] FDA and  has been authorized for detection and/or diagnosis of  SARS-CoV-2 by FDA under an Emergency Use Authorization (EUA). This EUA will remain  in effect (meaning this test can be used) for the duration of the COVID-19 declaratio n under Section 564(b)(1) of the Act, 21 U.S.C. section 360bbb-3(b)(1), unless the authorization is terminated or revoked sooner. Performed at [facility], [address], [place], [place] [address]   Blood Culture (routine x 2)     Status: None (Preliminary result)   Collection Time: 12/30/19  4:51 PM   Specimen: BLOOD LEFT HAND  Result Value Ref Range Status   Specimen Description BLOOD LEFT HAND  Final   Special Requests   Final    BOTTLES DRAWN AEROBIC AND ANAEROBIC Blood Culture adequate volume Performed at [facility], [address], [place], [place] [address]    Culture NO GROWTH < 12 HOURS  Final   Report Status PENDING  Incomplete  Blood Culture (routine x 2)     Status: None (Preliminary result)   Collection Time: 12/30/19  5:43 PM   Specimen: BLOOD RIGHT HAND  Result Value Ref Range Status   Specimen Description BLOOD RIGHT HAND  Final   Special Requests   Final    BOTTLES DRAWN AEROBIC AND ANAEROBIC Blood Culture results may not be optimal due to an excessive volume of blood received in culture bottles Performed at [facility], [address] [place], [place] [address]    Culture NO GROWTH < 12 HOURS  Final   Report Status PENDING  Incomplete      Radiology Studies: DG Chest Port 1 View  Result Date: 12/30/2019 CLINICAL DATA:  COVID positive.  Weakness, cough EXAM: PORTABLE CHEST 1 VIEW COMPARISON:  [id number] FINDINGS: Mild cardiomegaly. No confluent airspace opacities or effusions. Low lung volumes. No acute bony abnormality. IMPRESSION: Mild cardiomegaly.  Low volumes.  No confluent opacities. Electronically Signed   By: Charlett Nose M.D.   On: 12/30/2019 16:56       LOS: 2 days   Matalynn Graff Rito Ehrlich  [facility] Pager on  [email or web address]  01/01/2020, 12:19 PM

## 2020-01-01 NOTE — Progress Notes (Signed)
Initial Nutrition Assessment RD working remotely.  DOCUMENTATION CODES:   Obesity unspecified  INTERVENTION:   Ensure Enlive po TID, each supplement provides 350 kcal and 20 grams of protein.  Pt receiving Hormel Shake daily with Breakfast which provides 520 kcals and 22 g of protein and Magic cup BID with lunch and dinner, each supplement provides 290 kcal and 9 grams of protein, automatically on meal trays to optimize nutritional intake.   NUTRITION DIAGNOSIS:   Increased nutrient needs related to acute illness, catabolic illness(COVID-19) as evidenced by estimated needs.  GOAL:   Patient will meet greater than or equal to 90% of their needs  MONITOR:   PO intake, Supplement acceptance, Labs  REASON FOR ASSESSMENT:   Malnutrition Screening Tool    ASSESSMENT:   74 yo female admitted with weakness, SOB, found to have COVID-19. PMH includes HTN, obesity, asthma, vitamin D deficiency, DM.   Labs reviewed. Na 132 (L) CBG's: 282-325 Elevated blood sugar related to acute stress and steroid requirement.  Medications reviewed and include vitamin C, decadron, novolog, tradjenta, levemir, zinc sulfate, remdesivir.   Currently requiring 2L oxygen via [place].  Attempted to speak with patient via phone call, but no answer.   She is on a CHO modified diet, consuming 0-25% of meals per RN documentation. Patient would benefit from nutrient dense PO supplements to ensure adequate intake to meet increased nutrition needs for COVID-19. Given pt's history of diabetes with ongoing elevated blood glucose, RD will continue to monitor PO intake, CBGs, and adjust supplement regimen as appropriate.    NUTRITION - FOCUSED PHYSICAL EXAM:  unable to complete  Diet Order:   Diet Order            Diet Carb Modified Fluid consistency: Thin; Room service appropriate? Yes  Diet effective now              EDUCATION NEEDS:   Not appropriate for education at this time  Skin:  Skin  Assessment: Reviewed RN Assessment  Last BM:  2/8  Height:   Ht Readings from Last 1 Encounters:  12/31/19 5' (1.524 m)    Weight:   Wt Readings from Last 1 Encounters:  12/31/19 88 kg    Ideal Body Weight:  45.5 kg  BMI:  Body mass index is 37.87 kg/m.  Estimated Nutritional Needs:   Kcal:  1800-2000  Protein:  85-105 gm  Fluid:  >/= 1.8 L    Joaquin Courts, RD, LDN, CNSC Contact information can be found in Amion.

## 2020-01-01 NOTE — Plan of Care (Signed)
  Problem: Education: Goal: Knowledge of risk factors and measures for prevention of condition will improve Outcome: Progressing   Problem: Coping: Goal: Psychosocial and spiritual needs will be supported Outcome: Progressing   Problem: Respiratory: Goal: Will maintain a patent airway Outcome: Progressing Goal: Complications related to the disease process, condition or treatment will be avoided or minimized Outcome: Progressing   Problem: Education: Goal: Knowledge of General Education information will improve Description: Including pain rating scale, medication(s)/side effects and non-pharmacologic comfort measures Outcome: Progressing   Problem: Health Behavior/Discharge Planning: Goal: Ability to manage health-related needs will improve Outcome: Progressing   Problem: Clinical Measurements: Goal: Ability to maintain clinical measurements within normal limits will improve Outcome: Progressing Goal: Will remain free from infection Outcome: Progressing Goal: Diagnostic test results will improve Outcome: Progressing Goal: Respiratory complications will improve Outcome: Progressing Goal: Cardiovascular complication will be avoided Outcome: Progressing   Problem: Activity: Goal: Risk for activity intolerance will decrease Outcome: Progressing   Problem: Nutrition: Goal: Adequate nutrition will be maintained Outcome: Progressing   Problem: Coping: Goal: Level of anxiety will decrease Outcome: Progressing   Problem: Elimination: Goal: Will not experience complications related to bowel motility Outcome: Progressing Goal: Will not experience complications related to urinary retention Outcome: Progressing   Problem: Pain Managment: Goal: General experience of comfort will improve Outcome: Progressing   Problem: Safety: Goal: Ability to remain free from injury will improve Outcome: Progressing   Problem: Skin Integrity: Goal: Risk for impaired skin integrity will  decrease Outcome: Progressing   Problem: Education: Goal: Knowledge of General Education information will improve Description: Including pain rating scale, medication(s)/side effects and non-pharmacologic comfort measures Outcome: Progressing   Problem: Health Behavior/Discharge Planning: Goal: Ability to manage health-related needs will improve Outcome: Progressing   Problem: Clinical Measurements: Goal: Ability to maintain clinical measurements within normal limits will improve Outcome: Progressing Goal: Will remain free from infection Outcome: Progressing Goal: Diagnostic test results will improve Outcome: Progressing Goal: Respiratory complications will improve Outcome: Progressing Goal: Cardiovascular complication will be avoided Outcome: Progressing   Problem: Activity: Goal: Risk for activity intolerance will decrease Outcome: Progressing   Problem: Nutrition: Goal: Adequate nutrition will be maintained Outcome: Progressing   Problem: Coping: Goal: Level of anxiety will decrease Outcome: Progressing   Problem: Elimination: Goal: Will not experience complications related to bowel motility Outcome: Progressing Goal: Will not experience complications related to urinary retention Outcome: Progressing   Problem: Pain Managment: Goal: General experience of comfort will improve Outcome: Progressing   Problem: Safety: Goal: Ability to remain free from injury will improve Outcome: Progressing   Problem: Skin Integrity: Goal: Risk for impaired skin integrity will decrease Outcome: Progressing   

## 2020-01-01 NOTE — Progress Notes (Signed)
Occupational Therapy Evaluation Patient Details Name: Caitlin Harrington MRN: [id number] DOB: 1946-02-08 Today's Date: 01/01/2020    History of Present Illness 74 y.o. female with medical history significant for obesity, hypertension, diabetes gout, hypercholesteremia, asthma, who presented with weakness, shortness of breath, found to have Covid-19.  Chest x-ray was not impressive.  Clinically she was thought to have pneumonia.  Due to her respiratory symptoms and the fact that she is obese which places her at high risk for decompensation, she was hospitalized for further management.    Clinical Impression   PTA pt lived with fiance, independent in all ADL, IADL, and mobility tasks. Pt does not ambulate with an assistive device and reports 0 falls in the last 6 months. Pt still drives. Pt does not use oxygen at home and is currently on 2.5L [place]. Pt currently independent to min guard for self-care and functional transfer tasks. Pt able to ambulate to the bathroom with min guard and without an assistive device. Noted 0 instances of loss of balance, however pt unsteady on feet. Pt completed toileting task reported increased dizziness. See BP readings below. Pt unable to ambulate back to chair due to dizziness and weakness. Chair brought to bathroom with pt able to stand pivot with min guard. RN updated. SpO2 dropped to 81% on room air with pt unable to increase. 2.5L [place] donned with pt requiring ~1 min recovery back to 90s. Pt demonstrates decreased strength, endurance, balance, standing tolerance, activity tolerance, and safety awareness impacting ability to complete self-care and functional transfer tasks. Recommend skilled OT services to address above deficits in order to promote function and prevent further decline. Recommend SNF placement for additional rehab prior to discharge home. Pt is unable to safely and independently care for herself at this time with pt having limited support at home.   BP sitting  EOB: 124/68mmHg. BP after ambulating to bathroom: 84/57mmHg.  BP reclined in chair at end of session: 107/17mmHg.     Follow Up Recommendations  SNF(may progress to [facility] OT with 24/7 assist depending on progress)    Equipment Recommendations  3 in 1 bedside commode(for use in shower)    Recommendations for Other Services       Precautions / Restrictions Precautions Precautions: Fall;Other (comment) Precaution Comments: Monitor BP, (+) orthostatic hypotension Restrictions Weight Bearing Restrictions: No      Mobility Bed Mobility Overal bed mobility: Needs Assistance Bed Mobility: Supine to Sit     Supine to sit: Supervision     General bed mobility comments: To ensure balance and safety  Transfers Overall transfer level: Needs assistance Equipment used: None Transfers: Sit to/from [facility] Sit to Stand: Min guard Stand pivot transfers: Min guard       General transfer comment: Noted 0 instances of LOB, however pt unsteady on feet.    Balance Overall balance assessment: Mild deficits observed, not formally tested                                         ADL either performed or assessed with clinical judgement   ADL Overall ADL's : Needs assistance/impaired Eating/Feeding: Independent;Sitting   Grooming: Supervision/safety;Set up;Sitting   Upper Body Bathing: Supervision/ safety;Set up;Sitting   Lower Body Bathing: Supervison/ safety;Min guard;Sit to/from stand   Upper Body Dressing : Supervision/safety;Set up;Sitting   Lower Body Dressing: Supervision/safety;Min guard;Sit to/from stand   Toilet Transfer: Min  Catering manager;Ambulation;Grab bars   Toileting- Clothing Manipulation and Hygiene: Min guard;Sit to/from stand       Functional mobility during ADLs: Min guard General ADL Comments: Pt able to ambulate to bathroom with min guard and without use of an assistive device. Pt unable to ambulate back to chair  due to dizziness with chair brought to bathroom.      Vision Baseline Vision/History: Wears glasses Wears Glasses: Reading only       Perception     Praxis      Pertinent Vitals/Pain Pain Assessment: No/denies pain     Hand Dominance Right   Extremity/Trunk Assessment Upper Extremity Assessment Upper Extremity Assessment: Generalized weakness   Lower Extremity Assessment Lower Extremity Assessment: Defer to PT evaluation       Communication Communication Communication: No difficulties   Cognition Arousal/Alertness: Lethargic Behavior During Therapy: WFL for tasks assessed/performed;Flat affect Overall Cognitive Status: No family/caregiver present to determine baseline cognitive functioning                                     General Comments  Pt on 2.5L [place] with SpO2 94% at rest. SpO2 dropped to 81% on room air following activity with pt requiring ~1 min seated recovery to return to 90s on 2.5L [place]. Increased dizziness during activity.    Exercises Exercises: Other exercises Other Exercises Other Exercises: Encouraged pursed lip breathing throughout   Shoulder Instructions      Home Living Family/patient expects to be discharged to:: Private residence Living Arrangements: Other (Comment)(fiance. He's a truck driver and gone a lot) Available Help at Discharge: Family(granddaughter may be able to assist) Type of Home: House Home Access: Stairs to enter [employer] of Steps: 3   Home Layout: One level     Bathroom Shower/Tub: Chief Strategy Officer: Standard     Home Equipment: None   Additional Comments: Pt reports that fiance is gone for weeks at a time and she is alone. Pt reports that granddaughter may be able to assist her at home, however she works.       Prior Functioning/Environment Level of Independence: Independent        Comments: Pt independent with ADLs, IADLs, and mobility. Pt does not ambulate with  an assistive device and reports 0 falls in the last 6 months. Pt still drives. Pt is retired. Pt does not wear oxygen at home.         OT Problem List: Decreased strength;Decreased activity tolerance;Impaired balance (sitting and/or standing);Decreased safety awareness;Cardiopulmonary status limiting activity;Other (comment)((+) orthostatic hypotension)      OT Treatment/Interventions: Self-care/ADL training;Therapeutic exercise;Neuromuscular education;Energy conservation;DME and/or AE instruction;Therapeutic activities;Patient/family education;Balance training    OT Goals(Current goals can be found in the care plan section) Acute Rehab OT Goals Patient Stated Goal: to go home Time For Goal Achievement: 01/15/20 Potential to Achieve Goals: Good ADL Goals Additional ADL Goal #1: Pt to complete ADLs independently with SpO2 maintaining above 90% on room air. Additional ADL Goal #2: Pt to tolerate standing up to 10 minutes independently, in preparation for ADLs. Additional ADL Goal #3: Pt to recall and verbalize 3 fall prevention strategies with 0 verbal cues. Additional ADL Goal #4: Pt to recall and verbalize 3 energy conservation strategies with 0 verbal cues.  OT Frequency: Min 3X/week   Barriers to D/C: Other (comment)  Limited support at home  Co-evaluation              AM-PAC OT "6 Clicks" Daily Activity     Outcome Measure Help from another person eating meals?: None Help from another person taking care of personal grooming?: A Little Help from another person toileting, which includes using toliet, bedpan, or urinal?: A Little Help from another person bathing (including washing, rinsing, drying)?: A Little Help from another person to put on and taking off regular upper body clothing?: A Little Help from another person to put on and taking off regular lower body clothing?: A Little 6 Click Score: 19   End of Session Equipment Utilized During Treatment: Oxygen Nurse  Communication: Mobility status  Activity Tolerance: Patient limited by fatigue;Patient limited by lethargy(Limited by SOB and dizziness) Patient left: in chair;with call bell/phone within reach;with chair alarm set  OT Visit Diagnosis: Unsteadiness on feet (R26.81);Muscle weakness (generalized) (M62.81);Other (comment)((+) orthostatic hypotension)                Time: 2589-4834 OT Time Calculation (min): 35 min Charges:  OT General Charges $OT Visit: 1 Visit OT Evaluation $OT Eval Moderate Complexity: 1 Mod OT Treatments $Self Care/Home Management : 8-22 mins  Peterson Ao OTR/L [phone]  Peterson Ao 01/01/2020, 11:55 AM

## 2020-01-01 NOTE — Progress Notes (Signed)
Physical Therapy Evaluation Patient Details Name: Caitlin Harrington MRN: [id number] DOB: 11/19/1946 Today's Date: 01/01/2020   History of Present Illness  74 y.o. female with medical history significant for obesity, hypertension, diabetes gout, hypercholesteremia, asthma, who presented with weakness, shortness of breath, found to have Covid-19.  Chest x-ray was not impressive.  Clinically she was thought to have pneumonia.  Due to her respiratory symptoms and the fact that she is obese which places her at high risk for decompensation, she was hospitalized for further management.   Clinical Impression  Pt was found sitting in recliner upon approach with seat alarm on. Pt was agreeable to therapy, stating her want to go home as soon as possible since her finance is currently there and could assist her. Her vitals remained WFL for mobility today, but patient was unable to ambulate >10 feet before requiring seated rest break due to orthostatic hypotension. Pt educated on importance of vertical posture throughout the day to improve hypotensive episodes. She verbalized understanding. She was left up in bed in chair mode with HOB elevated to 60 degrees and demonstrated ability to increase to 65 degrees when eating. Pt demonstrates generalized LE weakness, unsteadiness on feet, difficulty walking, and reduced mobility; she could benefit from skilled PT to address above listed deficits.    Follow Up Recommendations Home health PT    Equipment Recommendations  3in1 (PT);Other (comment)(TBD based on mobility at discharge - might need a RW)    Recommendations for Other Services       Precautions / Restrictions Precautions Precautions: Fall;Other (comment) Precaution Comments: Monitor BP, (+) orthostatic hypotension Restrictions Weight Bearing Restrictions: No      Mobility  Bed Mobility Overal bed mobility: Needs Assistance Bed Mobility: Supine to Sit     Supine to sit: Supervision     General  bed mobility comments: To ensure balance and safety  Transfers Overall transfer level: Needs assistance Equipment used: 1 person hand held assist Transfers: Sit to/from [employer] Sit to Stand: Min guard Stand pivot transfers: Min guard       General transfer comment: Noted 2 near instances of LOB with recovery with HHA particularly during turns.  Ambulation/Gait Ambulation/Gait assistance: Min assist Gait Distance (Feet): 8 Feet Assistive device: 1 person hand held assist Gait Pattern/deviations: Step-through pattern;Shuffle;Wide base of support   Gait velocity interpretation: <1.8 ft/sec, indicate of risk for recurrent falls General Gait Details: Pt is very unsteady during turning, no lightheadedness reported, just "feeling off".   Stairs            Wheelchair Mobility    Modified Rankin (Stroke Patients Only)       Balance Overall balance assessment: Needs assistance(Pt demonstrates most difficulty with turning.) Sitting-balance support: Single extremity supported Sitting balance-Leahy Scale: Fair Sitting balance - Comments: Intermittently requires UE support for endurance Postural control: Right lateral lean Standing balance support: Single extremity supported Standing balance-Leahy Scale: Fair Standing balance comment: Pt demonstrated difficulty with turns and weight shifts, leaning to right intermittently                             Pertinent Vitals/Pain      Home Living Family/patient expects to be discharged to:: Private residence Living Arrangements: Other (Comment)(fiance. He's a truck driver and gone a lot) Available Help at Discharge: Family(granddaughter may be able to assist) Type of Home: House Home Access: Stairs to enter   [employer] of Steps: 3  Home Layout: One level Home Equipment: None Additional Comments: Pt reports that fiance is gone for weeks at a time and she is alone. Pt reports that  granddaughter may be able to assist her at home, however she works.     Prior Function Level of Independence: Independent         Comments: Pt independent with ADLs, IADLs, and mobility. Pt does not ambulate with an assistive device and reports 0 falls in the last 6 months. Pt still drives. Pt is retired. Pt does not wear oxygen at home.      Hand Dominance   Dominant Hand: Right    Extremity/Trunk Assessment   Upper Extremity Assessment Upper Extremity Assessment: Defer to OT evaluation    Lower Extremity Assessment Lower Extremity Assessment: Generalized weakness       Communication   Communication: No difficulties  Cognition Arousal/Alertness: Lethargic Behavior During Therapy: WFL for tasks assessed/performed;Flat affect Overall Cognitive Status: No family/caregiver present to determine baseline cognitive functioning                                 General Comments: (Pt perseverates on wanting bread with meals, called dietary)      General Comments General comments (skin integrity, edema, etc.): Pt on 2.5L [place] with no distress in sitting EOB or standing today. She demonstrated mild unsteadiness with turning requiring HHA for steadiness.     Exercises Other Exercises Other Exercises: Encouraged pursed lip breathing throughout Other Exercises: Flutter valve x 5 Other Exercises: Incentive spirometer x 5   Assessment/Plan    PT Assessment Patient needs continued PT services  PT Problem List Decreased strength;Decreased range of motion;Decreased activity tolerance;Decreased balance;Decreased mobility;Decreased coordination;Decreased knowledge of use of DME;Decreased knowledge of precautions;Decreased safety awareness;Cardiopulmonary status limiting activity;Obesity       PT Treatment Interventions DME instruction;Gait training;Stair training;Functional mobility training;Therapeutic activities;Therapeutic exercise;Balance training;Neuromuscular  re-education;Cognitive remediation;Patient/family education;Manual techniques    PT Goals (Current goals can be found in the Care Plan section)  Acute Rehab PT Goals Patient Stated Goal: to go home PT Goal Formulation: With patient Time For Goal Achievement: 01/15/20 Potential to Achieve Goals: Fair    Frequency Min 2X/week   Barriers to discharge Decreased caregiver support Pt states currently has support if she returns home soon (fiance), would need to confirm with family before discharging to make sure he would be avaliable.     Co-evaluation               AM-PAC PT "6 Clicks" Mobility  Outcome Measure Help needed turning from your back to your side while in a flat bed without using bedrails?: None Help needed moving from lying on your back to sitting on the side of a flat bed without using bedrails?: None Help needed moving to and from a bed to a chair (including a wheelchair)?: A Little Help needed standing up from a chair using your arms (e.g., wheelchair or bedside chair)?: A Little Help needed to walk in hospital room?: A Little Help needed climbing 3-5 steps with a railing? : A Lot 6 Click Score: 19    End of Session Equipment Utilized During Treatment: Gait belt;Oxygen Activity Tolerance: Patient limited by fatigue;Treatment limited secondary to medical complications (Comment)(orthostatic hypotension) Patient left: in bed;with call bell/phone within reach;with bed alarm set;Other (comment)(Bed in chair mode with HOB raised to 60 degrees) Nurse Communication: Mobility status;Other (comment)(request for bread) PT Visit Diagnosis: Unsteadiness  on feet (R26.81);Other abnormalities of gait and mobility (R26.89);Muscle weakness (generalized) (M62.81);Difficulty in walking, not elsewhere classified (R26.2)    Time: 1696-7893 PT Time Calculation (min) (ACUTE ONLY): 28 min   Charges:   PT Evaluation $PT Eval Low Complexity: 1 Low PT Treatments $Therapeutic Exercise:  8-22 mins        Oda Cogan PT, DPT Acute Rehab [facility] P: [phone]   Newman Nickels 01/01/2020, 4:43 PM

## 2020-01-02 LAB — CBC WITH DIFFERENTIAL/PLATELET
Abs Immature Granulocytes: 0.08 10*3/uL — ABNORMAL HIGH (ref 0.00–0.07)
Basophils Absolute: 0 10*3/uL (ref 0.0–0.1)
Basophils Relative: 0 %
Eosinophils Absolute: 0 10*3/uL (ref 0.0–0.5)
Eosinophils Relative: 0 %
HCT: 41.5 % (ref 36.0–46.0)
Hemoglobin: 14.4 g/dL (ref 12.0–15.0)
Immature Granulocytes: 1 %
Lymphocytes Relative: 14 %
Lymphs Abs: 1.4 10*3/uL (ref 0.7–4.0)
MCH: 28.7 pg (ref 26.0–34.0)
MCHC: 34.7 g/dL (ref 30.0–36.0)
MCV: 82.8 fL (ref 80.0–100.0)
Monocytes Absolute: 1 10*3/uL (ref 0.1–1.0)
Monocytes Relative: 10 %
Neutro Abs: 7.6 10*3/uL (ref 1.7–7.7)
Neutrophils Relative %: 75 %
Platelets: 251 10*3/uL (ref 150–400)
RBC: 5.01 MIL/uL (ref 3.87–5.11)
RDW: 13 % (ref 11.5–15.5)
WBC: 10.1 10*3/uL (ref 4.0–10.5)
nRBC: 0 % (ref 0.0–0.2)

## 2020-01-02 LAB — COMPREHENSIVE METABOLIC PANEL
ALT: 37 U/L (ref 0–44)
AST: 43 U/L — ABNORMAL HIGH (ref 15–41)
Albumin: 2.6 g/dL — ABNORMAL LOW (ref 3.5–5.0)
Alkaline Phosphatase: 66 U/L (ref 38–126)
Anion gap: 9 (ref 5–15)
BUN: 43 mg/dL — ABNORMAL HIGH (ref 8–23)
CO2: 24 mmol/L (ref 22–32)
Calcium: 8.6 mg/dL — ABNORMAL LOW (ref 8.9–10.3)
Chloride: 103 mmol/L (ref 98–111)
Creatinine, Ser: 1.03 mg/dL — ABNORMAL HIGH (ref 0.44–1.00)
GFR calc Af Amer: >60 mL/min (ref >60)
GFR calc non Af Amer: 55 mL/min — ABNORMAL LOW (ref >60)
Glucose, Bld: 123 mg/dL — ABNORMAL HIGH (ref 70–99)
Potassium: 3.4 mmol/L — ABNORMAL LOW (ref 3.5–5.1)
Sodium: 136 mmol/L (ref 135–145)
Total Bilirubin: 0.3 mg/dL (ref 0.3–1.2)
Total Protein: 6.7 g/dL (ref 6.5–8.1)

## 2020-01-02 LAB — HEMOGLOBIN A1C
Hgb A1c MFr Bld: 12.2 % — ABNORMAL HIGH (ref 4.8–5.6)
Mean Plasma Glucose: 303 mg/dL

## 2020-01-02 LAB — URIC ACID: Uric Acid, Serum: 6.4 mg/dL (ref 2.5–7.1)

## 2020-01-02 LAB — MAGNESIUM: Magnesium: 2.3 mg/dL (ref 1.7–2.4)

## 2020-01-02 LAB — C-REACTIVE PROTEIN: CRP: 5 mg/dL — ABNORMAL HIGH (ref <1.0)

## 2020-01-02 LAB — GLUCOSE, CAPILLARY
Glucose-Capillary: 248 mg/dL — ABNORMAL HIGH (ref 70–99)
Glucose-Capillary: 90 mg/dL (ref 70–99)
Glucose-Capillary: 213 mg/dL — ABNORMAL HIGH (ref 70–99)
Glucose-Capillary: 168 mg/dL — ABNORMAL HIGH (ref 70–99)
Glucose-Capillary: 225 mg/dL — ABNORMAL HIGH (ref 70–99)

## 2020-01-02 LAB — FERRITIN: Ferritin: 737 ng/mL — ABNORMAL HIGH (ref 11–307)

## 2020-01-02 LAB — D-DIMER, QUANTITATIVE: D-Dimer, Quant: 0.68 ug/mL-FEU — ABNORMAL HIGH (ref 0.00–0.50)

## 2020-01-02 MED ORDER — INSULIN STARTER KIT- SYRINGES (ENGLISH)
1.0000 | Freq: Once | Status: AC
  Administered 2020-01-02: 1
  Filled 2020-01-02: qty 1

## 2020-01-02 MED ORDER — LIVING WELL WITH DIABETES BOOK
Freq: Once | Status: AC
  Filled 2020-01-02: qty 1

## 2020-01-02 MED ORDER — POTASSIUM CHLORIDE CRYS ER 20 MEQ PO TBCR
40.0000 meq | EXTENDED_RELEASE_TABLET | Freq: Once | ORAL | Status: AC
  Administered 2020-01-02: 40 meq via ORAL
  Filled 2020-01-02: qty 2

## 2020-01-02 NOTE — Progress Notes (Signed)
Educated patient on fall risk and risk of injury to herself due to orthostatic hypotension after pt states that she refuses to go to rehab/snf and is going home. Advised pt that she is going to need a lot of help since she can not ambulate more than 10 feet per physical therapy note. Patient states that her finance will be there.

## 2020-01-02 NOTE — Progress Notes (Signed)
Physical Therapy Treatment Patient Details Name: Caitlin Harrington MRN: [id number] DOB: 05/27/1946 Today's Date: 01/02/2020    History of Present Illness 74 y.o. female with medical history significant for obesity, hypertension, diabetes gout, hypercholesteremia, asthma, who presented with weakness, shortness of breath, found to have Covid-19.  Chest x-ray was not impressive.  Clinically she was thought to have pneumonia.  Due to her respiratory symptoms and the fact that she is obese which places her at high risk for decompensation, she was hospitalized for further management.     PT Comments    Pt continues to have symptomatic orthostatic hypotension and has limited gait distance before she gets too lightheaded to continue.  She also needs O2 to mobilize (see separate O2 sat qualification note).  She would be safer going to post acute rehab at discharge where she would have the medical supervision and therapy she needs to get strong enough to go home with intermittent supervision.  She is a very high fall risk at this time.     01/02/20 1520  Vital Signs  Patient Position (if appropriate) Orthostatic Vitals  Orthostatic Lying   BP- Lying 139/84  Pulse- Lying 80  Orthostatic Sitting  BP- Sitting 127/84  Pulse- Sitting 93  Orthostatic Standing at 3 minutes  BP- Standing at 3 minutes (!) 56/26 (rebounded to 94/63 after seated rest and walk to the bathroom)  Pulse- Standing at 3 minutes 93  Oxygen Therapy  SpO2 93 %  O2 Device Nasal Cannula  O2 Flow Rate (L/min) 2.5 L/min  Patient Activity (if Appropriate) In bed  Pulse Oximetry Type Continuous       Follow Up Recommendations  SNF;Other (comment)(Pt does not have 24/7 support at home.)     Equipment Recommendations  3in1 (PT);Rolling walker with 5" wheels;Other (comment)(home O2)    Recommendations for Other Services   NA     Precautions / Restrictions Precautions Precautions: Fall;Other (comment) Precaution Comments:  Monitor BP, (+) orthostatic hypotension and symptomatic    Mobility  Bed Mobility Overal bed mobility: Needs Assistance Bed Mobility: Supine to Sit     Supine to sit: Supervision     General bed mobility comments: supervision for safety, light use of railing.  HOB mildly elevated  Transfers Overall transfer level: Needs assistance Equipment used: None Transfers: Sit to/from Stand Sit to Stand: Min guard;+2 safety/equipment         General transfer comment: Min guard assist from EOB to preform standing BPs.  Second person for safety and line management.   Ambulation/Gait Ambulation/Gait assistance: Min assist Gait Distance (Feet): 15 Feet Assistive device: 1 person hand held assist Gait Pattern/deviations: Step-through pattern;Staggering left;Staggering right     General Gait Details: Pt with mildly staggering gait pattern, reports lightheadedness with (+) orthostatics, chair to follow to bathroom to ensure she did not get so lightheaded she passed out.  O2 sats stable on 3L O2  (she dropped to 85% on RA at rest) during gait without signs of DOE, however, at the end of gait, pt did get very symptomatic and needed to sit to avoid syncopal event.  Legs put up in recliner immediately and BP re-checked (soft but stable once reclined--see vitals flow sheet for details).         Balance Overall balance assessment: Needs assistance Sitting-balance support: Feet supported;No upper extremity supported Sitting balance-Leahy Scale: Good     Standing balance support: Bilateral upper extremity supported;Single extremity supported Standing balance-Leahy Scale: Fair  Cognition Arousal/Alertness: Awake/alert Behavior During Therapy: Flat affect Overall Cognitive Status: Within Functional Limits for tasks assessed                                 General Comments: Not specifically tested.      Exercises Other  Exercises Other Exercises: reviewed incentive spirometer and flutter valve with pt x5 reps each.  She was able to pull max 750 mL         Pertinent Vitals/Pain Pain Assessment: No/denies pain           PT Goals (current goals can now be found in the care plan section) Acute Rehab PT Goals Patient Stated Goal: to go home Progress towards PT goals: Progressing toward goals    Frequency    Min 3X/week      PT Plan Current plan remains appropriate       AM-PAC PT "6 Clicks" Mobility   Outcome Measure  Help needed turning from your back to your side while in a flat bed without using bedrails?: None Help needed moving from lying on your back to sitting on the side of a flat bed without using bedrails?: None Help needed moving to and from a bed to a chair (including a wheelchair)?: A Little Help needed standing up from a chair using your arms (e.g., wheelchair or bedside chair)?: A Little Help needed to walk in hospital room?: A Little Help needed climbing 3-5 steps with a railing? : A Little 6 Click Score: 20    End of Session Equipment Utilized During Treatment: Gait belt;Oxygen Activity Tolerance: Patient limited by fatigue(limited by symptomatic orthostatic hypotension) Patient left: in chair;with call bell/phone within reach;with chair alarm set Nurse Communication: Mobility status PT Visit Diagnosis: Unsteadiness on feet (R26.81);Other abnormalities of gait and mobility (R26.89);Muscle weakness (generalized) (M62.81);Difficulty in walking, not elsewhere classified (R26.2)     Time: 8338-2505 PT Time Calculation (min) (ACUTE ONLY): 31 min  Charges:  $Gait Training: 8-22 mins $Therapeutic Activity: 8-22 mins             Corinna Capra, PT, DPT  Acute Rehabilitation [phone] pager #[phone] office  @ [facility]: [phone]            01/02/2020, 6:55 PM

## 2020-01-02 NOTE — Progress Notes (Signed)
Inpatient Diabetes Program Recommendations  AACE/ADA: New Consensus Statement on Inpatient Glycemic Control (2015)  Target Ranges:  Prepandial:   less than 140 mg/dL      Peak postprandial:   less than 180 mg/dL (1-2 hours)      Critically ill patients:  140 - 180 mg/dL   Lab Results  Component Value Date   GLUCAP 213 (H) 01/02/2020   HGBA1C 12.2 (H) 01/01/2020    Review of Glycemic Control  Diabetes history: DM2 Outpatient Diabetes medications: No current meds (has been on Metformin in the past) Current orders for Inpatient glycemic control: Levemir 24 units bid + Novolog 6 units tid meal coverage + Novolog resistant correction tid + hs 0-5 units + Tradjenta 5 mg qd  Inpatient Diabetes Program Recommendations:   Spoke with patient via phone (DM coordinator @ [facility]). Patient states she has never taken insulin in the past and does not like to prick her fingers. Patient used a continuous glucose device in the past, but unsure if she still has an order for one @ home. Patient has been drinking large amount of regular Dr. Malachi Bonds along and with sweets and breads. Reviewed risks with elevated CBGs and patient agrees to decrease carbohydrates and change to diet Dr. Malachi Bonds and water when she gets to go home. Ordered Living Well With Diabetes for patient along with insulin syringe starter kit to start insulin injection teaching. Sent secure chat to RN Elder Negus Bobronski to start insulin teaching with patient and allow patient to give own injections as appropriate. Please give patient a glucose monitoring kit from [facility] office to take home with her.  Thank you, Nani Gasser. Airiana Elman, RN, MSN, CDE  Diabetes Coordinator Inpatient Glycemic Control Team Team Pager [phone] (8am-5pm) 01/02/2020 12:57 PM

## 2020-01-02 NOTE — Progress Notes (Addendum)
PROGRESS NOTE  Caitlin Harrington OEV:[id number] DOB: 04/15/1946 DOA: 12/30/2019  PCP: Ileana Ladd, MD  Brief History/Interval Summary: 74 y.o. female with medical history significant for obesity, hypertension, diabetes gout, hypercholesteremia, asthma, who presented with weakness, shortness of breath, found to have Covid-19.  Chest x-ray was not impressive.  Clinically she was thought to have pneumonia.  Due to her respiratory symptoms and the fact that she is obese which places her at high risk for decompensation, she was hospitalized for further management.   Reason for Visit: Pneumonia due to COVID-19  Consultants: None  Procedures: None  Antibiotics: Anti-infectives (From admission, onward)   Start     Dose/Rate Route Frequency Ordered Stop   12/31/19 1000  remdesivir 100 mg in sodium chloride 0.9 % 100 mL IVPB     100 mg 200 mL/hr over 30 Minutes Intravenous Daily 12/30/19 1844 01/04/20 0959   12/30/19 1915  remdesivir 200 mg in sodium chloride 0.9% 250 mL IVPB     200 mg 580 mL/hr over 30 Minutes Intravenous Once 12/30/19 1844 12/30/19 2038      Subjective/Interval History: Patient complaining about her breakfast being cold.  Otherwise states that her shortness of breath is slightly better.  Occasional cough.  No nausea vomiting.    Assessment/Plan:  Pneumonia due to COVID-19/acute respiratory failure with hypoxia  Recent Labs  Lab 12/30/19 1715 12/31/19 0413 01/01/20 0550 01/02/20 0605  DDIMER 0.95* 0.93* 1.81* 0.68*  FERRITIN 419* 423* 548* 737*  CRP 10.8* 13.4* 9.0* 5.0*  ALT 36 37 42 37  PROCALCITON <0.10  --   --   --     Objective findings: Fever: Remains afebrile Oxygen requirements: Nasal cannula.  2 L/min.  Saturating in the early 90s.     COVID 19 Therapeutics: Antibacterials: None Remdesivir: Day 4 Steroids: Dexamethasone Diuretics: None Actemra: Not given yet Convalescent Plasma: Not given yet Vitamin C and Zinc: Continue PUD  Prophylaxis: Pepcid DVT Prophylaxis:  Lovenox   Patient remains stable from a respiratory standpoint.  She is still requiring about 2 L.  Continue remdesivir and steroids.  Inflammatory markers have improved with CRP down to 5.0.  D-dimer is improved as well.  Continue with incentive spirometry mobilization.  Out of bed to chair.  Seen by PT.  SNF was recommended depending on how she progresses however patient is reluctant to consider SNF at this time.  Diabetes mellitus type 2, uncontrolled with hyperglycemia HbA1c is 12.2.  Patient has not taken her Metformin in several months.  Current hyperglycemia is due to steroids.  Continue Levemir and SSI for now.  Will likely need insulin at home at discharge.  Diabetes coordinator is following.  Essential hypertension/orthostatic hypotension Blood pressure is reasonably well controlled. Noted to be orthostatic today. Will stop Amlodipine.  History of gout Apparently has not been on allopurinol or colchicine in many months.  Colchicine was stopped as patient was experiencing loose stools.  Okay to continue allopurinol for now.  Uric acid level is 6.4.  Hyperlipidemia Continue statin.  Hyponatremia/hypokalemia Sodium level is normal this morning.  Will replace potassium.  Magnesium is normal at 2.3.    Obesity Estimated body mass index is 37.87 kg/m as calculated from the following:   Height as of this encounter: 5' (1.524 m).   Weight as of this encounter: 88 kg.   DVT Prophylaxis: Lovenox Code Status: Full code Family Communication: Discussed with her granddaughter today. They are interested in her going to rehab. They will  talk to patient.  Disposition Plan: Continue to mobilize.  Patient reluctant to consider SNF at this time.  We will see if she makes any progress in the next day or so.     Medications:  Scheduled: . allopurinol  300 mg Oral Daily  . amLODipine  5 mg Oral Daily  . vitamin C  500 mg Oral Daily  . dexamethasone  (DECADRON) injection  6 mg Intravenous Q24H  . enoxaparin (LOVENOX) injection  45 mg Subcutaneous Q24H  . famotidine  20 mg Oral Daily  . feeding supplement (GLUCERNA SHAKE)  237 mL Oral TID BM  . insulin aspart  0-20 Units Subcutaneous TID WC  . insulin aspart  0-5 Units Subcutaneous QHS  . insulin aspart  6 Units Subcutaneous TID WC  . insulin detemir  24 Units Subcutaneous BID  . linagliptin  5 mg Oral Daily  . living well with diabetes book   Does not apply Once  . pravastatin  20 mg Oral Daily  . sodium chloride flush  3 mL Intravenous Q12H  . zinc sulfate  220 mg Oral Daily   Continuous: . remdesivir 100 mg in NS 100 mL 100 mg (01/02/20 0914)   TZG:YFVCBSWHQ, bisacodyl, chlorpheniramine-HYDROcodone, guaiFENesin-dextromethorphan, naphazoline-pheniramine, ondansetron **OR** ondansetron (ZOFRAN) IV, polyethylene glycol   Objective:  Vital Signs  Vitals:   01/01/20 1642 01/01/20 2000 01/02/20 0345 01/02/20 0809  BP: 118/71 111/73 123/68 125/75  Pulse: 70 67 66 67  Resp: 18 18 17 18   Temp:  97.7 F (36.5 C) 98 F (36.7 C) 97.6 F (36.4 C)  TempSrc:  Axillary Axillary Oral  SpO2: 91% 96% 97% 93%  Weight:      Height:        Intake/Output Summary (Last 24 hours) at 01/02/2020 1205 Last data filed at 01/01/2020 2000 Gross per 24 hour  Intake 480 ml  Output -  Net 480 ml   Filed Weights   12/31/19 0033  Weight: 88 kg    General appearance: Awake alert.  In no distress Resp: Normal effort at rest.  Crackles bilateral bases.  No wheezing or rhonchi.   Cardio: S1-S2 is normal regular.  No S3-S4.  No rubs murmurs or bruit GI: Abdomen is soft.  Nontender nondistended.  Bowel sounds are present normal.  No masses organomegaly Extremities: No edema.  Full range of motion of lower extremities. Neurologic: Alert and oriented x3.  No focal neurological deficits.    Lab Results:  Data Reviewed: I have personally reviewed following labs and imaging studies  CBC: Recent  Labs  Lab 12/30/19 1715 12/31/19 0413 01/01/20 0550 01/02/20 0605  WBC 7.6 4.6 8.1 10.1  NEUTROABS 5.7 3.4 5.9 7.6  HGB 14.7 13.8 14.4 14.4  HCT 43.6 41.3 41.9 41.5  MCV 84.7 84.1 82.3 82.8  PLT 204 209 236 759    Basic Metabolic Panel: Recent Labs  Lab 12/30/19 1715 12/31/19 0413 01/01/20 0550 01/02/20 0605  NA 132* 132* 132* 136  K 3.8 4.3 4.2 3.4*  CL 96* 97* 99 103  CO2 23 22 23 24   GLUCOSE 227* 401* 265* 123*  BUN 18 25* 41* 43*  CREATININE 1.10* 1.06* 1.09* 1.03*  CALCIUM 8.2* 8.3* 8.7* 8.6*  MG  --  2.0 2.3 2.3  PHOS  --  4.0  --   --     GFR: Estimated Creatinine Clearance: 49.4 mL/min (A) (by C-G formula based on SCr of 1.03 mg/dL (H)).  Liver Function Tests: Recent Labs  Lab  12/30/19 1715 12/31/19 0413 01/01/20 0550 01/02/20 0605  AST 64* 59* 55* 43*  ALT 36 37 42 37  ALKPHOS 75 74 71 66  BILITOT 1.2 0.6 1.0 0.3  PROT 7.8 7.3 7.0 6.7  ALBUMIN 3.1* 2.8* 2.5* 2.6*    CBG: Recent Labs  Lab 01/01/20 0806 01/01/20 1254 01/01/20 1635 01/02/20 0807 01/02/20 1128  GLUCAP 282* 325* 246* 90 213*    Lipid Profile: Recent Labs    12/30/19 1715  TRIG 45     Anemia Panel: Recent Labs    01/01/20 0550 01/02/20 0605  FERRITIN 548* 737*    Recent Results (from the past 240 hour(s))  SARS CORONAVIRUS 2 (TAT 6-24 HRS) Nasopharyngeal Nasopharyngeal Swab     Status: Abnormal   Collection Time: 12/30/19  4:51 PM   Specimen: Nasopharyngeal Swab  Result Value Ref Range Status   SARS Coronavirus 2 POSITIVE (A) NEGATIVE Final    Comment: EMAILED LORI B. AT 0110 ON 12/31/2019 BY MESSAN H.  (NOTE) SARS-CoV-2 target nucleic acids are DETECTED. The SARS-CoV-2 RNA is generally detectable in upper and lower respiratory specimens during the acute phase of infection. Positive results are indicative of the presence of SARS-CoV-2 RNA. Clinical correlation with patient history and other diagnostic information is  necessary to determine patient infection  status. Positive results do not rule out bacterial infection or co-infection with other viruses.  The expected result is Negative. Fact Sheet for Patients: HairSlick.no Fact Sheet for Healthcare Providers: quierodirigir.com This test is not yet approved or cleared by the [place] FDA and  has been authorized for detection and/or diagnosis of SARS-CoV-2 by FDA under an Emergency Use Authorization (EUA). This EUA will remain  in effect (meaning this test can be used) for the duration of the COVID-19 declaratio n under Section 564(b)(1) of the Act, 21 U.S.C. section 360bbb-3(b)(1), unless the authorization is terminated or revoked sooner. Performed at [facility], [address] [place], [place] [address]   Blood Culture (routine x 2)     Status: None (Preliminary result)   Collection Time: 12/30/19  4:51 PM   Specimen: BLOOD LEFT HAND  Result Value Ref Range Status   Specimen Description   Final    BLOOD LEFT HAND Performed at [facility], [address], [place], [place] [address]    Special Requests   Final    BOTTLES DRAWN AEROBIC AND ANAEROBIC Blood Culture adequate volume Performed at [facility], [address] [place], [place] [address]    Culture   Final    NO GROWTH 3 DAYS Performed at [facility], [address], [place], [place] [address]    Report Status PENDING  Incomplete  Blood Culture (routine x 2)     Status: None (Preliminary result)   Collection Time: 12/30/19  5:43 PM   Specimen: BLOOD RIGHT HAND  Result Value Ref Range Status   Specimen Description   Final    BLOOD RIGHT HAND Performed at [facility], [address], [place], [place] [address]    Special Requests   Final    BOTTLES DRAWN AEROBIC AND ANAEROBIC Blood Culture results may not be optimal due to an excessive volume of blood received in culture bottles Performed  at [facility], [address], [place], [place] [address]    Culture   Final    NO GROWTH 3 DAYS Performed at [facility], [address]  [place], [place] [address]    Report Status PENDING  Incomplete      Radiology Studies: No results found.     LOS: 3 days   Zaylah Blecha [employer] on [email or web address]  01/02/2020, 12:05 PM

## 2020-01-02 NOTE — Progress Notes (Signed)
Pt's son Carolyne Littles Called and  Was updated. Discussed with son our recommendation that pt go to rehab, but that pt had refused and stated that her finance lives with her and is home 24/7. 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 Discussed need to monitor blood glucose, need to be on oxygen at home, dizziness related to orthostatic BP drops and increased fall risk. Notified Dr. Osvaldo Shipper of son's statement and gave granddaughters number.

## 2020-01-02 NOTE — Progress Notes (Signed)
SATURATION QUALIFICATIONS: (This note is used to comply with regulatory documentation for home oxygen)  Patient Saturations on Room Air at Rest = 85%  Patient Saturations on Room Air while Ambulating = NT due to 85% at rest  Patient Saturations on 3 Liters of oxygen while Ambulating = 88%  Please briefly explain why patient needs home oxygen:   Pt desaturates on RA at rest.   Corinna Capra, PT, DPT  Acute Rehabilitation [phone] pager [phone] office  @ [facility]: [phone]

## 2020-01-02 NOTE — Plan of Care (Signed)
No acute events during this shift. Pt remains on 2LNC sats 98%. Pt is ambulatory. Pt refusing rehab. Insists on going home at discharge.    Problem: Education: Goal: Knowledge of risk factors and measures for prevention of condition will improve Outcome: Progressing   Problem: Coping: Goal: Psychosocial and spiritual needs will be supported Outcome: Progressing   Problem: Respiratory: Goal: Will maintain a patent airway Outcome: Progressing Goal: Complications related to the disease process, condition or treatment will be avoided or minimized Outcome: Progressing   Problem: Education: Goal: Knowledge of General Education information will improve Description: Including pain rating scale, medication(s)/side effects and non-pharmacologic comfort measures Outcome: Progressing   Problem: Health Behavior/Discharge Planning: Goal: Ability to manage health-related needs will improve Outcome: Progressing   Problem: Clinical Measurements: Goal: Ability to maintain clinical measurements within normal limits will improve Outcome: Progressing Goal: Will remain free from infection Outcome: Progressing Goal: Diagnostic test results will improve Outcome: Progressing Goal: Respiratory complications will improve Outcome: Progressing Goal: Cardiovascular complication will be avoided Outcome: Progressing   Problem: Activity: Goal: Risk for activity intolerance will decrease Outcome: Progressing   Problem: Nutrition: Goal: Adequate nutrition will be maintained Outcome: Progressing   Problem: Coping: Goal: Level of anxiety will decrease Outcome: Progressing   Problem: Elimination: Goal: Will not experience complications related to bowel motility Outcome: Progressing Goal: Will not experience complications related to urinary retention Outcome: Progressing   Problem: Pain Managment: Goal: General experience of comfort will improve Outcome: Progressing   Problem: Safety: Goal:  Ability to remain free from injury will improve Outcome: Progressing   Problem: Skin Integrity: Goal: Risk for impaired skin integrity will decrease Outcome: Progressing   Problem: Education: Goal: Knowledge of General Education information will improve Description: Including pain rating scale, medication(s)/side effects and non-pharmacologic comfort measures Outcome: Progressing   Problem: Health Behavior/Discharge Planning: Goal: Ability to manage health-related needs will improve Outcome: Progressing   Problem: Clinical Measurements: Goal: Ability to maintain clinical measurements within normal limits will improve Outcome: Progressing Goal: Will remain free from infection Outcome: Progressing Goal: Diagnostic test results will improve Outcome: Progressing Goal: Respiratory complications will improve Outcome: Progressing Goal: Cardiovascular complication will be avoided Outcome: Progressing   Problem: Activity: Goal: Risk for activity intolerance will decrease Outcome: Progressing   Problem: Nutrition: Goal: Adequate nutrition will be maintained Outcome: Progressing   Problem: Coping: Goal: Level of anxiety will decrease Outcome: Progressing   Problem: Elimination: Goal: Will not experience complications related to bowel motility Outcome: Progressing Goal: Will not experience complications related to urinary retention Outcome: Progressing   Problem: Pain Managment: Goal: General experience of comfort will improve Outcome: Progressing   Problem: Safety: Goal: Ability to remain free from injury will improve Outcome: Progressing   Problem: Skin Integrity: Goal: Risk for impaired skin integrity will decrease Outcome: Progressing

## 2020-01-03 DIAGNOSIS — J9621 Acute and chronic respiratory failure with hypoxia: Secondary | ICD-10-CM | POA: Diagnosis present

## 2020-01-03 DIAGNOSIS — I10 Essential (primary) hypertension: Secondary | ICD-10-CM

## 2020-01-03 DIAGNOSIS — E119 Type 2 diabetes mellitus without complications: Secondary | ICD-10-CM

## 2020-01-03 LAB — CBC WITH DIFFERENTIAL/PLATELET
Abs Immature Granulocytes: 0.07 10*3/uL (ref 0.00–0.07)
Basophils Absolute: 0 10*3/uL (ref 0.0–0.1)
Basophils Relative: 0 %
Eosinophils Absolute: 0 10*3/uL (ref 0.0–0.5)
Eosinophils Relative: 1 %
HCT: 41.6 % (ref 36.0–46.0)
Hemoglobin: 14.2 g/dL (ref 12.0–15.0)
Immature Granulocytes: 1 %
Lymphocytes Relative: 26 %
Lymphs Abs: 2.1 10*3/uL (ref 0.7–4.0)
MCH: 28.7 pg (ref 26.0–34.0)
MCHC: 34.1 g/dL (ref 30.0–36.0)
MCV: 84 fL (ref 80.0–100.0)
Monocytes Absolute: 0.8 10*3/uL (ref 0.1–1.0)
Monocytes Relative: 9 %
Neutro Abs: 5.2 10*3/uL (ref 1.7–7.7)
Neutrophils Relative %: 63 %
Platelets: 239 10*3/uL (ref 150–400)
RBC: 4.95 MIL/uL (ref 3.87–5.11)
RDW: 13.3 % (ref 11.5–15.5)
WBC: 8.2 10*3/uL (ref 4.0–10.5)
nRBC: 0 % (ref 0.0–0.2)

## 2020-01-03 LAB — GLUCOSE, CAPILLARY
Glucose-Capillary: 64 mg/dL — ABNORMAL LOW (ref 70–99)
Glucose-Capillary: 83 mg/dL (ref 70–99)
Glucose-Capillary: 172 mg/dL — ABNORMAL HIGH (ref 70–99)
Glucose-Capillary: 134 mg/dL — ABNORMAL HIGH (ref 70–99)

## 2020-01-03 LAB — D-DIMER, QUANTITATIVE: D-Dimer, Quant: 0.7 ug/mL-FEU — ABNORMAL HIGH (ref 0.00–0.50)

## 2020-01-03 LAB — COMPREHENSIVE METABOLIC PANEL
ALT: 33 U/L (ref 0–44)
AST: 49 U/L — ABNORMAL HIGH (ref 15–41)
Albumin: 2.7 g/dL — ABNORMAL LOW (ref 3.5–5.0)
Alkaline Phosphatase: 67 U/L (ref 38–126)
Anion gap: 9 (ref 5–15)
BUN: 32 mg/dL — ABNORMAL HIGH (ref 8–23)
CO2: 25 mmol/L (ref 22–32)
Calcium: 8.8 mg/dL — ABNORMAL LOW (ref 8.9–10.3)
Chloride: 109 mmol/L (ref 98–111)
Creatinine, Ser: 0.86 mg/dL (ref 0.44–1.00)
GFR calc Af Amer: >60 mL/min (ref >60)
GFR calc non Af Amer: >60 mL/min (ref >60)
Glucose, Bld: 68 mg/dL — ABNORMAL LOW (ref 70–99)
Potassium: 3.6 mmol/L (ref 3.5–5.1)
Sodium: 143 mmol/L (ref 135–145)
Total Bilirubin: 0.3 mg/dL (ref 0.3–1.2)
Total Protein: 6.6 g/dL (ref 6.5–8.1)

## 2020-01-03 LAB — C-REACTIVE PROTEIN: CRP: 3.7 mg/dL — ABNORMAL HIGH (ref <1.0)

## 2020-01-03 LAB — FERRITIN: Ferritin: 619 ng/mL — ABNORMAL HIGH (ref 11–307)

## 2020-01-03 LAB — MAGNESIUM: Magnesium: 2.1 mg/dL (ref 1.7–2.4)

## 2020-01-03 MED ORDER — INSULIN DETEMIR 100 UNIT/ML ~~LOC~~ SOLN
20.0000 [IU] | Freq: Two times a day (BID) | SUBCUTANEOUS | Status: DC
  Administered 2020-01-03: 20 [IU] via SUBCUTANEOUS
  Filled 2020-01-03 (×3): qty 0.2

## 2020-01-03 NOTE — Progress Notes (Signed)
PROGRESS NOTE  Caitlin Harrington:[id number] DOB: 1946-02-21 DOA: 12/30/2019 PCP: Ileana Ladd, MD  HPI/Recap of past 38 hours: 74 year old female with past medical history of morbid obesity, hypertension, diabetes and asthma seen by her PCP after having 1 week of cough, shortness of breath with exertion and aches and tested positive for Covid on 2/5.  She came to the emergency room with worsening symptoms on 2/6 and admitted to the hospitalist service after she was noted to be hypoxic.  Started on IV remdesivir and steroids.  Over the next few days, patient has slowly improved.  CRP trending downward.  She completed her 5-day course of Remdisivir on 2/10.  Her CRP today is at 3.7.  She was evaluated by PT and OT who are recommending skilled nursing given that she is felt to be a high fall risk.  Initially patient was reluctant, but when seeing how weak she is, relented.  She does not complain of shortness of breath at rest, but does so with exertion.  Assessment/Plan: Principal Problem:   Acute on chronic respiratory failure with hypoxia (HCC) secondary to COVID-19 in patient with history of mild persistent asthma, which is currently stable: Completed 5-day course of IV Remdisivir and currently on IV steroids.  Continuing incentive spirometry.  She still requiring some oxygen and will likely need to be discharged on oxygen for short-term Active Problems:   HTN (hypertension): Patient had episodes of orthostatic hypotension and Norvasc was discontinued.  Blood pressure has been much improved since then.    Prolonged depressive adjustment reaction    Morbid obesity (HCC): Meets criteria BMI greater than 35+ history of diabetes and hypertension    Vitamin D deficiency    Hypercholesteremia: Continue statin.    DM (diabetes mellitus) (HCC): Uncontrolled.  Patient has been off of Metformin for months.  On Levemir and sliding scale for now during hospitalization.  A1c at 12.2.  She will need  insulin upon discharge.    Gout: Not been on allopurinol or colchicine for months.  On allopurinol for now.  Uric acid level at 6.4.  Code Status: Full code  Family Communication: Updated granddaughter by phone.  Disposition Plan: Potential discharge to skilled nursing tomorrow or Friday once bed approved   Consultants:  None  Procedures:  None  Antimicrobials:  IV Remdisivir 2/6-2/10  DVT prophylaxis: Lovenox   Objective: Vitals:   01/03/20 1020 01/03/20 1030  BP: 137/67 (!) 96/54  Pulse: 78   Resp:    Temp:    SpO2: 93% (!) 85%    Intake/Output Summary (Last 24 hours) at 01/03/2020 1551 Last data filed at 01/03/2020 1404 Gross per 24 hour  Intake 1190 ml  Output 0 ml  Net 1190 ml   Filed Weights   12/31/19 0033  Weight: 88 kg   Body mass index is 37.87 kg/m.  Exam:   General: Alert and oriented x3, no acute distress  HEENT: Normocephalic and atraumatic, mucous membranes are slightly dry  Cardiovascular: Regular rate and rhythm S1-S2  Respiratory: Clear to auscultation bilaterally  Abdomen: Soft, nontender, nondistended, positive bowel sounds  Musculoskeletal: No clubbing or cyanosis or edema  Skin: No skin breaks, tears or lesions  Psychiatry: Appropriate, no evidence of psychoses   Data Reviewed: CBC: Recent Labs  Lab 12/30/19 1715 12/31/19 0413 01/01/20 0550 01/02/20 0605 01/03/20 0600  WBC 7.6 4.6 8.1 10.1 8.2  NEUTROABS 5.7 3.4 5.9 7.6 5.2  HGB 14.7 13.8 14.4 14.4 14.2  HCT 43.6 41.3  41.9 41.5 41.6  MCV 84.7 84.1 82.3 82.8 84.0  PLT 204 209 236 251 239   Basic Metabolic Panel: Recent Labs  Lab 12/30/19 1715 12/31/19 0413 01/01/20 0550 01/02/20 0605 01/03/20 0600  NA 132* 132* 132* 136 143  K 3.8 4.3 4.2 3.4* 3.6  CL 96* 97* 99 103 109  CO2 23 22 23 24 25   GLUCOSE 227* 401* 265* 123* 68*  BUN 18 25* 41* 43* 32*  CREATININE 1.10* 1.06* 1.09* 1.03* 0.86  CALCIUM 8.2* 8.3* 8.7* 8.6* 8.8*  MG  --  2.0 2.3 2.3 2.1   PHOS  --  4.0  --   --   --    GFR: Estimated Creatinine Clearance: 59.2 mL/min (by C-G formula based on SCr of 0.86 mg/dL). Liver Function Tests: Recent Labs  Lab 12/30/19 1715 12/31/19 0413 01/01/20 0550 01/02/20 0605 01/03/20 0600  AST 64* 59* 55* 43* 49*  ALT 36 37 42 37 33  ALKPHOS 75 74 71 66 67  BILITOT 1.2 0.6 1.0 0.3 0.3  PROT 7.8 7.3 7.0 6.7 6.6  ALBUMIN 3.1* 2.8* 2.5* 2.6* 2.7*   No results for input(s): LIPASE, AMYLASE in the last 168 hours. No results for input(s): AMMONIA in the last 168 hours. Coagulation Profile: No results for input(s): INR, PROTIME in the last 168 hours. Cardiac Enzymes: No results for input(s): CKTOTAL, CKMB, CKMBINDEX, TROPONINI in the last 168 hours. BNP (last 3 results) No results for input(s): PROBNP in the last 8760 hours. HbA1C: Recent Labs    01/01/20 0550  HGBA1C 12.2*   CBG: Recent Labs  Lab 01/02/20 1639 01/02/20 2048 01/03/20 0711 01/03/20 0828 01/03/20 1152  GLUCAP 168* 225* 64* 83 172*   Lipid Profile: No results for input(s): CHOL, HDL, LDLCALC, TRIG, CHOLHDL, LDLDIRECT in the last 72 hours. Thyroid Function Tests: No results for input(s): TSH, T4TOTAL, FREET4, T3FREE, THYROIDAB in the last 72 hours. Anemia Panel: Recent Labs    01/02/20 0605 01/03/20 0600  FERRITIN 737* 619*   Urine analysis:    Component Value Date/Time   BILIRUBINUR neg 09/21/2013 1236   PROTEINUR neg 09/21/2013 1236   UROBILINOGEN negative 09/21/2013 1236   NITRITE neg 09/21/2013 1236   LEUKOCYTESUR Trace 09/21/2013 1236   Sepsis Labs: @LABRCNTIP (procalcitonin:4,lacticidven:4)  ) Recent Results (from the past 240 hour(s))  SARS CORONAVIRUS 2 (TAT 6-24 HRS) Nasopharyngeal Nasopharyngeal Swab     Status: Abnormal   Collection Time: 12/30/19  4:51 PM   Specimen: Nasopharyngeal Swab  Result Value Ref Range Status   SARS Coronavirus 2 POSITIVE (A) NEGATIVE Final    Comment: EMAILED LORI B. AT 0110 ON 12/31/2019 BY MESSAN H.   (NOTE) SARS-CoV-2 target nucleic acids are DETECTED. The SARS-CoV-2 RNA is generally detectable in upper and lower respiratory specimens during the acute phase of infection. Positive results are indicative of the presence of SARS-CoV-2 RNA. Clinical correlation with patient history and other diagnostic information is  necessary to determine patient infection status. Positive results do not rule out bacterial infection or co-infection with other viruses.  The expected result is Negative. Fact Sheet for Patients: 02/27/20 Fact Sheet for Healthcare Providers: 02/28/2020 This test is not yet approved or cleared by the [employer] FDA and  has been authorized for detection and/or diagnosis of SARS-CoV-2 by FDA under an Emergency Use Authorization (EUA). This EUA will remain  in effect (meaning this test can be used) for the duration of the COVID-19 declaratio n under Section 564(b)(1) of the Act, 21  U.S.C. section 360bbb-3(b)(1), unless the authorization is terminated or revoked sooner. Performed at [facility], [place] [address], [place], [place] [address]   Blood Culture (routine x 2)     Status: None (Preliminary result)   Collection Time: 12/30/19  4:51 PM   Specimen: BLOOD LEFT HAND  Result Value Ref Range Status   Specimen Description   Final    BLOOD LEFT HAND Performed at [facility] [address] [place], [place] [address]    Special Requests   Final    BOTTLES DRAWN AEROBIC AND ANAEROBIC Blood Culture adequate volume Performed at [facility] [address] [place], [place] [address]    Culture   Final    NO GROWTH 4 DAYS Performed at [facility], [place] [address], [place], [place] [address]    Report Status PENDING  Incomplete  Blood Culture (routine x 2)     Status: None (Preliminary result)   Collection Time: 12/30/19  5:43 PM   Specimen: BLOOD  RIGHT HAND  Result Value Ref Range Status   Specimen Description   Final    BLOOD RIGHT HAND Performed at [facility] [address], [place], [place] [address]    Special Requests   Final    BOTTLES DRAWN AEROBIC AND ANAEROBIC Blood Culture results may not be optimal due to an excessive volume of blood received in culture bottles Performed at [facility] [address] [place], [place] [address]    Culture   Final    NO GROWTH 4 DAYS Performed at [facility], [place] [address] [place], [place] [address]    Report Status PENDING  Incomplete      Studies: No results found.  Scheduled Meds: . allopurinol  300 mg Oral Daily  . vitamin C  500 mg Oral Daily  . dexamethasone (DECADRON) injection  6 mg Intravenous Q24H  . enoxaparin (LOVENOX) injection  45 mg Subcutaneous Q24H  . famotidine  20 mg Oral Daily  . feeding supplement (GLUCERNA SHAKE)  237 mL Oral TID BM  . insulin aspart  0-20 Units Subcutaneous TID WC  . insulin aspart  0-5 Units Subcutaneous QHS  . insulin aspart  6 Units Subcutaneous TID WC  . insulin detemir  20 Units Subcutaneous BID  . linagliptin  5 mg Oral Daily  . pravastatin  20 mg Oral Daily  . sodium chloride flush  3 mL Intravenous Q12H  . zinc sulfate  220 mg Oral Daily    Continuous Infusions:   LOS: 4 days     Annita Brod, MD [facility]   01/03/2020, 3:51 PM

## 2020-01-03 NOTE — Progress Notes (Signed)
Inpatient Diabetes Program Recommendations  AACE/ADA: New Consensus Statement on Inpatient Glycemic Control (2015)  Target Ranges:  Prepandial:   less than 140 mg/dL      Peak postprandial:   less than 180 mg/dL (1-2 hours)      Critically ill patients:  140 - 180 mg/dL   Lab Results  Component Value Date   GLUCAP 64 (L) 01/03/2020   HGBA1C 12.2 (H) 01/01/2020    Review of Glycemic Control Results for Caitlin Harrington, KREHBIEL (MRN [id number]) as of 01/03/2020 09:47  Ref. Range 01/02/2020 11:28 01/02/2020 16:39 01/02/2020 20:48 01/03/2020 07:11  Glucose-Capillary Latest Ref Range: 70 - 99 mg/dL 607 (H) 371 (H) 062 (H) 64 (L)   Diabetes history:DM2 Outpatient Diabetes medications:No current meds (has been on Metformin in the past) Current orders for Inpatient glycemic control:Levemir 24 units bid + Novolog 6 units tid meal coverage + Novolog resistant correction tid + hs 0-5 units + Tradjenta 5 mg qd  Inpatient Diabetes Program Recommendations:   Noted hypoglycemia this AM of 64 mg/dL. Consider decreasing Levemir to 20 units BID.   Thanks, Lujean Rave, MSN, RNC-OB Diabetes Coordinator [phone] (8a-5p)

## 2020-01-03 NOTE — Progress Notes (Signed)
Occupational Therapy Treatment Patient Details Name: Caitlin Harrington MRN: [id number] DOB: Nov 17, 1946 Today's Date: 01/03/2020    History of present illness 74 y.o. female with medical history significant for obesity, hypertension, diabetes gout, hypercholesteremia, asthma, who presented with weakness, shortness of breath, found to have Covid-19.  Chest x-ray was not impressive.  Clinically she was thought to have pneumonia.  Due to her respiratory symptoms and the fact that she is obese which places her at high risk for decompensation, she was hospitalized for further management.    OT comments  Pt making progress in therapy, demonstrating improved independence in self-care tasks with BP much better today. See below for BP readings. Pt transferred to bedside chair with min guard and without an assistive device. Noted 0 instances of loss of balance, however pt unsteady on feet. Pt engaged in sponge bathing task able to stand 1 x 3 min, 1 x 3.5 min, and 2 x 1.5 min with supervision. Pt required seated rest breaks throughout due to fatigue with rest of bathing task completed in sitting. Pt on 2.5L [place] with SpO2 maintaining in 90s. No reports of dizziness throughout. Educated pt on safety strategies and fall prevention techniques (handout provided) with fair understanding. OT will continue to follow acutely.   BP Semi-reclined in bed: 119/18mmHg.  BP Sitting EOB: 123/72mmHg.  BP Standing: 110/69mmHg.  BP After activity: 118/59mmHg.    Follow Up Recommendations  SNF(may progress to [facility] OT and supervision, depending on progress)    Equipment Recommendations  3 in 1 bedside commode(for use in shower)    Recommendations for Other Services      Precautions / Restrictions Precautions Precautions: Fall;Other (comment) Precaution Comments: Monitor BP, (+) orthostatic hypotension and symptomatic Restrictions Weight Bearing Restrictions: No       Mobility Bed Mobility Overal bed mobility: Modified  Independent Bed Mobility: Supine to Sit           General bed mobility comments: HOB elevated, use of bedrail  Transfers Overall transfer level: Needs assistance Equipment used: None Transfers: Sit to/from [facility] Sit to Stand: Min guard Stand pivot transfers: Min guard       General transfer comment: Pt able to transfer to bedside chair with min guard. Noted 0 instances of LOB, however pt unsteady on feet.     Balance Overall balance assessment: Needs assistance Sitting-balance support: Feet supported;No upper extremity supported Sitting balance-Leahy Scale: Good       Standing balance-Leahy Scale: Fair                             ADL either performed or assessed with clinical judgement   ADL Overall ADL's : Needs assistance/impaired     Grooming: Wash/dry hands;Wash/dry face;Brushing hair;Minimal assistance;Sitting   Upper Body Bathing: Supervision/ safety;Set up;Sitting   Lower Body Bathing: Supervison/ safety;Sit to/from stand;Set up                       Functional mobility during ADLs: Min guard General ADL Comments: Pt able to transfer to bedside chair with min guard and without use of an assistive device.      Vision       Perception     Praxis      Cognition Arousal/Alertness: Awake/alert Behavior During Therapy: Flat affect Overall Cognitive Status: Within Functional Limits for tasks assessed  Exercises     Shoulder Instructions       General Comments Pt on 2.5L [place] with SpO2 maintaining in 90s throughout. No reports of dizziness during session. Pt required frequent rest breaks due to fatigue.     Pertinent Vitals/ Pain       Pain Assessment: No/denies pain  Home Living                                          Prior Functioning/Environment              Frequency           Progress Toward Goals  OT  Goals(current goals can now be found in the care plan section)  Progress towards OT goals: Progressing toward goals  ADL Goals Additional ADL Goal #1: Pt to complete ADLs independently with SpO2 maintaining above 90% on room air. Additional ADL Goal #2: Pt to tolerate standing up to 10 minutes independently, in preparation for ADLs. Additional ADL Goal #3: Pt to recall and verbalize 3 fall prevention strategies with 0 verbal cues. Additional ADL Goal #4: Pt to recall and verbalize 3 energy conservation strategies with 0 verbal cues.  Plan Discharge plan remains appropriate    Co-evaluation                 AM-PAC OT "6 Clicks" Daily Activity     Outcome Measure   Help from another person eating meals?: None Help from another person taking care of personal grooming?: A Little Help from another person toileting, which includes using toliet, bedpan, or urinal?: A Little Help from another person bathing (including washing, rinsing, drying)?: A Little Help from another person to put on and taking off regular upper body clothing?: A Little Help from another person to put on and taking off regular lower body clothing?: A Little 6 Click Score: 19    End of Session Equipment Utilized During Treatment: Oxygen  OT Visit Diagnosis: Unsteadiness on feet (R26.81);Muscle weakness (generalized) (M62.81);Other (comment)   Activity Tolerance Patient limited by fatigue   Patient Left in chair;with call bell/phone within reach;with chair alarm set   Nurse Communication Mobility status        Time: 1962-2297 OT Time Calculation (min): 48 min  Charges: OT General Charges $OT Visit: 1 Visit OT Treatments $Self Care/Home Management : 8-22 mins $Therapeutic Activity: 23-37 mins  Peterson Ao OTR/L [phone]   Peterson Ao 01/03/2020, 10:03 AM

## 2020-01-03 NOTE — Progress Notes (Signed)
Physical Therapy Treatment Patient Details Name: Caitlin Harrington MRN: [id number] DOB: [birth year] Today's Date: 01/03/2020    History of Present Illness 74 y.o. female with medical history significant for obesity, hypertension, diabetes gout, hypercholesteremia, asthma, who presented with weakness, shortness of breath, found to have Covid-19.  Chest x-ray was not impressive.  Clinically she was thought to have pneumonia.  Due to her respiratory symptoms and the fact that she is obese which places her at high risk for decompensation, she was hospitalized for further management.     PT Comments    Patient still orthostatic but improvement since yesterday. Patient not symptomatic. O2 saturation 85% on 2L after ambulation 75ft in room with RW and one assist. O2 sat 87% post ambulation trial 2 with RW 35ft. Patient reluctantly agreeable to SNF/STR. Education provided on benefits of rehab. Recommendation is for continued skilled PT services and discharge to SNF for continued physical therapy services as patient currently is at risk for falls, requires hands on assist for safe mobility, requires weaning and monitoring of her oxygen and BP management. Patient is below her PLOF.    01/03/20 1020  Vitals  BP 137/67 (seated, start of session)  BP Location Left Arm  BP Method Automatic  Pulse Rate 78  Orthostatic Standing at 0 minutes  BP- Standing at 0 minutes (!) 86/60  Pulse- Standing at 0 minutes 94  Oxygen Therapy  SpO2 93 %  O2 Device Nasal Cannula  O2 Flow Rate (L/min) 2.5 L/min   2nd time standing: BP 96/54   Follow Up Recommendations  SNF     Equipment Recommendations  3in1 (PT);Rolling walker with 5" wheels(home O2 pending ability to wean suppl O2)    Recommendations for Other Services       Precautions / Restrictions Precautions Precautions: Fall(orthostatic) Precaution Comments: orthostatic and not symptomatic Restrictions Weight Bearing Restrictions: No    Mobility  Bed Mobility Overal bed mobility: Modified Independent Bed Mobility: Supine to Sit           General bed mobility comments: Pt already OOB in recliner chair upon PT arrival.  Transfers Overall transfer level: Needs assistance Equipment used: Rolling walker (2 wheeled);None Transfers: Sit to/from Stand Sit to Stand: From elevated surface;Min guard Stand pivot transfers: Min guard       General transfer comment: sit>stand from recliner chair with CGA without AD, sit<>stand from recliner chair trial 2 with RW and CGA  Ambulation/Gait Ambulation/Gait assistance: Min guard Gait Distance (Feet): 20 Feet(11ft trial 2) Assistive device: Rolling walker (2 wheeled) Gait Pattern/deviations: Step-through pattern(decreased gait speed)     General Gait Details: Mild staggering despite RW. Cues and assist for O2 tube mgmt.        Balance Overall balance assessment: Needs assistance Sitting-balance support: Feet supported;No upper extremity supported Sitting balance-Leahy Scale: Good     Standing balance support: No upper extremity supported Standing balance-Leahy Scale: Fair(static standing balance fair)        Cognition Arousal/Alertness: Awake/alert Behavior During Therapy: Flat affect Overall Cognitive Status: Within Functional Limits for tasks assessed       Exercises General Exercises - Lower Extremity Ankle Circles/Pumps: AROM;10 reps;Seated Long Arc Quad: AROM;Seated(x approx 8 reps) Other Exercises Other Exercises: Flutter x 10 reps with rest breaks as needed. Incentive spirometer x 10 reps with rest breaks as needed, high of 750.    General Comments General comments (skin integrity, edema, etc.): Pt on 2.5L suppl O2 via [place] at start of session, 2L during ambulation  and at end of session. O2 sat 91% at rest at end of session.      Pertinent Vitals/Pain Pain Assessment: No/denies pain(no complaints in pain, no signs/symptoms of pain)           PT Goals  (current goals can now be found in the care plan section) Acute Rehab PT Goals Patient Stated Goal: Pt relunctantly agreeable to rehab/SNF. Progress towards PT goals: Progressing toward goals    Frequency    Min 2X/week      PT Plan Current plan remains appropriate    Co-evaluation    AM-PAC PT "6 Clicks" Mobility   Outcome Measure  Help needed turning from your back to your side while in a flat bed without using bedrails?: None Help needed moving from lying on your back to sitting on the side of a flat bed without using bedrails?: None Help needed moving to and from a bed to a chair (including a wheelchair)?: A Little Help needed standing up from a chair using your arms (e.g., wheelchair or bedside chair)?: A Little Help needed to walk in hospital room?: A Little Help needed climbing 3-5 steps with a railing? : A Little 6 Click Score: 20    End of Session Equipment Utilized During Treatment: Oxygen Activity Tolerance: Patient tolerated treatment well(continues to be orthostatic but improvement since yesterday) Patient left: in chair;with call bell/phone within reach;with chair alarm set Nurse Communication: Mobility status(via secure chat) PT Visit Diagnosis: Unsteadiness on feet (R26.81);Difficulty in walking, not elsewhere classified (R26.2);Muscle weakness (generalized) (M62.81)     Time: 6433-2951 PT Time Calculation (min) (ACUTE ONLY): 33 min  Charges:  $Gait Training: 8-22 mins $Therapeutic Activity: 8-22 mins                     Birdie Hopes, DPT, PT Acute Rehab [phone]     Birdie Hopes 01/03/2020, 11:15 AM

## 2020-01-03 NOTE — Plan of Care (Signed)
  Problem: Education: Goal: Knowledge of risk factors and measures for prevention of condition will improve Outcome: Progressing   Problem: Coping: Goal: Psychosocial and spiritual needs will be supported Outcome: Progressing   Problem: Respiratory: Goal: Will maintain a patent airway Outcome: Progressing Goal: Complications related to the disease process, condition or treatment will be avoided or minimized Outcome: Progressing   Problem: Education: Goal: Knowledge of General Education information will improve Description: Including pain rating scale, medication(s)/side effects and non-pharmacologic comfort measures Outcome: Progressing   Problem: Health Behavior/Discharge Planning: Goal: Ability to manage health-related needs will improve Outcome: Progressing   Problem: Clinical Measurements: Goal: Ability to maintain clinical measurements within normal limits will improve Outcome: Progressing Goal: Will remain free from infection Outcome: Progressing Goal: Diagnostic test results will improve Outcome: Progressing Goal: Respiratory complications will improve Outcome: Progressing Goal: Cardiovascular complication will be avoided Outcome: Progressing   Problem: Activity: Goal: Risk for activity intolerance will decrease Outcome: Progressing   Problem: Nutrition: Goal: Adequate nutrition will be maintained Outcome: Progressing   Problem: Coping: Goal: Level of anxiety will decrease Outcome: Progressing   Problem: Elimination: Goal: Will not experience complications related to bowel motility Outcome: Progressing Goal: Will not experience complications related to urinary retention Outcome: Progressing   Problem: Pain Managment: Goal: General experience of comfort will improve Outcome: Progressing   Problem: Safety: Goal: Ability to remain free from injury will improve Outcome: Progressing   Problem: Skin Integrity: Goal: Risk for impaired skin integrity will  decrease Outcome: Progressing   Problem: Education: Goal: Knowledge of General Education information will improve Description: Including pain rating scale, medication(s)/side effects and non-pharmacologic comfort measures Outcome: Progressing   Problem: Health Behavior/Discharge Planning: Goal: Ability to manage health-related needs will improve Outcome: Progressing   Problem: Clinical Measurements: Goal: Ability to maintain clinical measurements within normal limits will improve Outcome: Progressing Goal: Will remain free from infection Outcome: Progressing Goal: Diagnostic test results will improve Outcome: Progressing Goal: Respiratory complications will improve Outcome: Progressing Goal: Cardiovascular complication will be avoided Outcome: Progressing   Problem: Activity: Goal: Risk for activity intolerance will decrease Outcome: Progressing   Problem: Nutrition: Goal: Adequate nutrition will be maintained Outcome: Progressing   Problem: Coping: Goal: Level of anxiety will decrease Outcome: Progressing   Problem: Elimination: Goal: Will not experience complications related to bowel motility Outcome: Progressing Goal: Will not experience complications related to urinary retention Outcome: Progressing   Problem: Pain Managment: Goal: General experience of comfort will improve Outcome: Progressing   Problem: Safety: Goal: Ability to remain free from injury will improve Outcome: Progressing   Problem: Skin Integrity: Goal: Risk for impaired skin integrity will decrease Outcome: Progressing   

## 2020-01-04 DIAGNOSIS — R0602 Shortness of breath: Secondary | ICD-10-CM | POA: Diagnosis not present

## 2020-01-04 DIAGNOSIS — J9621 Acute and chronic respiratory failure with hypoxia: Secondary | ICD-10-CM | POA: Diagnosis not present

## 2020-01-04 DIAGNOSIS — J45901 Unspecified asthma with (acute) exacerbation: Secondary | ICD-10-CM | POA: Diagnosis not present

## 2020-01-04 DIAGNOSIS — U071 COVID-19: Secondary | ICD-10-CM | POA: Diagnosis not present

## 2020-01-04 DIAGNOSIS — M6281 Muscle weakness (generalized): Secondary | ICD-10-CM | POA: Diagnosis not present

## 2020-01-04 DIAGNOSIS — R52 Pain, unspecified: Secondary | ICD-10-CM | POA: Diagnosis not present

## 2020-01-04 DIAGNOSIS — Z7401 Bed confinement status: Secondary | ICD-10-CM | POA: Diagnosis not present

## 2020-01-04 DIAGNOSIS — I10 Essential (primary) hypertension: Secondary | ICD-10-CM | POA: Diagnosis not present

## 2020-01-04 DIAGNOSIS — E7800 Pure hypercholesterolemia, unspecified: Secondary | ICD-10-CM | POA: Diagnosis not present

## 2020-01-04 DIAGNOSIS — J9610 Chronic respiratory failure, unspecified whether with hypoxia or hypercapnia: Secondary | ICD-10-CM | POA: Diagnosis not present

## 2020-01-04 DIAGNOSIS — M2550 Pain in unspecified joint: Secondary | ICD-10-CM | POA: Diagnosis not present

## 2020-01-04 DIAGNOSIS — E119 Type 2 diabetes mellitus without complications: Secondary | ICD-10-CM | POA: Diagnosis not present

## 2020-01-04 DIAGNOSIS — M109 Gout, unspecified: Secondary | ICD-10-CM | POA: Diagnosis not present

## 2020-01-04 LAB — COMPREHENSIVE METABOLIC PANEL
ALT: 28 U/L (ref 0–44)
AST: 39 U/L (ref 15–41)
Albumin: 2.7 g/dL — ABNORMAL LOW (ref 3.5–5.0)
Alkaline Phosphatase: 66 U/L (ref 38–126)
Anion gap: 9 (ref 5–15)
BUN: 23 mg/dL (ref 8–23)
CO2: 26 mmol/L (ref 22–32)
Calcium: 8.5 mg/dL — ABNORMAL LOW (ref 8.9–10.3)
Chloride: 105 mmol/L (ref 98–111)
Creatinine, Ser: 0.78 mg/dL (ref 0.44–1.00)
GFR calc Af Amer: >60 mL/min (ref >60)
GFR calc non Af Amer: >60 mL/min (ref >60)
Glucose, Bld: 64 mg/dL — ABNORMAL LOW (ref 70–99)
Potassium: 3.8 mmol/L (ref 3.5–5.1)
Sodium: 140 mmol/L (ref 135–145)
Total Bilirubin: 0.4 mg/dL (ref 0.3–1.2)
Total Protein: 6.4 g/dL — ABNORMAL LOW (ref 6.5–8.1)

## 2020-01-04 LAB — CULTURE, BLOOD (ROUTINE X 2)
Culture: NO GROWTH
Culture: NO GROWTH
Special Requests: ADEQUATE

## 2020-01-04 LAB — GLUCOSE, CAPILLARY
Glucose-Capillary: 117 mg/dL — ABNORMAL HIGH (ref 70–99)
Glucose-Capillary: 55 mg/dL — ABNORMAL LOW (ref 70–99)
Glucose-Capillary: 58 mg/dL — ABNORMAL LOW (ref 70–99)
Glucose-Capillary: 106 mg/dL — ABNORMAL HIGH (ref 70–99)
Glucose-Capillary: 166 mg/dL — ABNORMAL HIGH (ref 70–99)

## 2020-01-04 LAB — CBC WITH DIFFERENTIAL/PLATELET
Abs Immature Granulocytes: 0.11 10*3/uL — ABNORMAL HIGH (ref 0.00–0.07)
Basophils Absolute: 0 10*3/uL (ref 0.0–0.1)
Basophils Relative: 1 %
Eosinophils Absolute: 0.1 10*3/uL (ref 0.0–0.5)
Eosinophils Relative: 2 %
HCT: 39.2 % (ref 36.0–46.0)
Hemoglobin: 12.7 g/dL (ref 12.0–15.0)
Immature Granulocytes: 2 %
Lymphocytes Relative: 18 %
Lymphs Abs: 1.4 10*3/uL (ref 0.7–4.0)
MCH: 27.7 pg (ref 26.0–34.0)
MCHC: 32.4 g/dL (ref 30.0–36.0)
MCV: 85.4 fL (ref 80.0–100.0)
Monocytes Absolute: 0.7 10*3/uL (ref 0.1–1.0)
Monocytes Relative: 10 %
Neutro Abs: 5 10*3/uL (ref 1.7–7.7)
Neutrophils Relative %: 67 %
Platelets: 258 10*3/uL (ref 150–400)
RBC: 4.59 MIL/uL (ref 3.87–5.11)
RDW: 13.4 % (ref 11.5–15.5)
WBC: 7.4 10*3/uL (ref 4.0–10.5)
nRBC: 0 % (ref 0.0–0.2)

## 2020-01-04 LAB — D-DIMER, QUANTITATIVE: D-Dimer, Quant: 0.91 ug/mL-FEU — ABNORMAL HIGH (ref 0.00–0.50)

## 2020-01-04 LAB — C-REACTIVE PROTEIN: CRP: 6.4 mg/dL — ABNORMAL HIGH (ref <1.0)

## 2020-01-04 LAB — FERRITIN: Ferritin: 510 ng/mL — ABNORMAL HIGH (ref 11–307)

## 2020-01-04 LAB — MAGNESIUM: Magnesium: 2 mg/dL (ref 1.7–2.4)

## 2020-01-04 MED ORDER — ALLOPURINOL 300 MG PO TABS: 300.0000 mg | ORAL_TABLET | Freq: Every day | ORAL | 1 refills | Status: DC

## 2020-01-04 MED ORDER — POLYETHYLENE GLYCOL 3350 17 G PO PACK: 17.0000 g | PACK | Freq: Every day | ORAL | 0 refills | Status: DC | PRN

## 2020-01-04 MED ORDER — ALBUTEROL SULFATE HFA 108 (90 BASE) MCG/ACT IN AERS: 2.0000 | INHALATION_SPRAY | Freq: Four times a day (QID) | RESPIRATORY_TRACT | 1 refills | Status: DC | PRN

## 2020-01-04 MED ORDER — PRAVASTATIN SODIUM 10 MG PO TABS: 20.0000 mg | ORAL_TABLET | Freq: Every day | ORAL | 1 refills | Status: DC

## 2020-01-04 MED ORDER — PREDNISONE 10 MG PO TABS: ORAL_TABLET | ORAL | 0 refills | Status: AC

## 2020-01-04 MED ORDER — LINAGLIPTIN 5 MG PO TABS: 5.0000 mg | ORAL_TABLET | Freq: Every day | ORAL | 1 refills | Status: DC

## 2020-01-04 MED ORDER — INSULIN DETEMIR 100 UNIT/ML ~~LOC~~ SOLN: SUBCUTANEOUS | 11 refills | Status: DC

## 2020-01-04 NOTE — Care Management Important Message (Signed)
Important Message  Patient Details  Name: LAURA-LEE VILLEGAS MRN: [id number] Date of Birth: 01/03/1946   Medicare Important Message Given:  Yes - Important Message mailed due to current National Emergency  Verbal consent obtained due to current National Emergency  Relationship to patient: Child Contact Name: Oneita Hurt Call Date: 01/04/20  Time: 1508 Phone: [phone] Outcome: Spoke with contact Important Message mailed to: Emergency contact on file    Orson Aloe 01/04/2020, 3:10 PM

## 2020-01-04 NOTE — NC FL2 (Signed)
[place] LEVEL OF CARE SCREENING TOOL     IDENTIFICATION  Patient Name: Caitlin Harrington Birthdate: 1946-07-09 Sex: female Admission Date (Current Location): 12/30/2019  [facility] and [place] Number:  Herbalist and Address:  [facility],  [place] [address] [place] [address](pt is in [place] room [id number])      Provider Number: [id number]  Attending Physician Name and Address:  Annita Brod, MD  Relative Name and Phone Number:       Current Level of Care: Hospital Recommended Level of Care: [place] Prior Approval Number:    Date Approved/Denied:   PASRR Number: [id number]  Discharge Plan:      Current Diagnoses: Patient Active Problem List   Diagnosis Date Noted  . Acute on chronic respiratory failure with hypoxia ([place]) 01/03/2020  . COVID-19 12/30/2019  . Gout   . HTN (hypertension) 02/18/2011  . Prolonged depressive adjustment reaction 02/18/2011  . Morbid obesity ([place]) 02/18/2011  . Asthma, mild persistent 02/18/2011  . Vitamin D deficiency 02/18/2011  . Hypercholesteremia 02/18/2011  . DM (diabetes mellitus) ([place]) 02/18/2011  . Insomnia 02/18/2011  . Stress at work 02/18/2011    Orientation RESPIRATION BLADDER Height & Weight     Self, Situation, Place, Time  O2(presently on o2 at 2l/min via nasal cannula) Continent Weight: 88 kg Height:  5' (152.4 cm)  BEHAVIORAL SYMPTOMS/MOOD NEUROLOGICAL BOWEL NUTRITION STATUS      Continent Diet(regular)  AMBULATORY STATUS COMMUNICATION OF NEEDS Skin   Extensive Assist Verbally Normal                       Personal Care Assistance Level of Assistance  Bathing, Feeding, Dressing Bathing Assistance: Limited assistance Feeding assistance: Limited assistance Dressing Assistance: Limited assistance     Functional Limitations Info  Sight, Hearing, Speech Sight Info: Adequate Hearing Info: Adequate Speech Info: Adequate    SPECIAL CARE FACTORS  FREQUENCY  PT (By licensed PT)     PT Frequency: 5 x weekly              Contractures Contractures Info: Not present    Additional Factors Info  Code Status Code Status Info: fulkl             Current Medications (01/04/2020):  This is the current hospital active medication list Current Facility-Administered Medications  Medication Dose Route Frequency Provider Last Rate Last Admin  . albuterol (VENTOLIN HFA) 108 (90 Base) MCG/ACT inhaler 2 puff  2 puff Inhalation Q6H PRN Clinton Sawyer A, DO   2 puff at 12/31/19 0759  . allopurinol (ZYLOPRIM) tablet 300 mg  300 mg Oral Daily Clinton Sawyer A, DO   300 mg at 01/04/20 1029  . ascorbic acid (VITAMIN C) tablet 500 mg  500 mg Oral Daily Clinton Sawyer A, DO   500 mg at 01/04/20 1029  . bisacodyl (DULCOLAX) suppository 10 mg  10 mg Rectal Daily PRN Margart Sickles, DO      . chlorpheniramine-HYDROcodone (TUSSIONEX) 10-8 MG/5ML suspension 5 mL  5 mL Oral Q12H PRN Clinton Sawyer A, DO      . dexamethasone (DECADRON) injection 6 mg  6 mg Intravenous Q24H Clinton Sawyer A, DO      . enoxaparin (LOVENOX) injection 45 mg  45 mg Subcutaneous Q24H Rolla Flatten, RPH   45 mg at 01/04/20 0535  . famotidine (PEPCID) tablet 20 mg  20 mg Oral Daily [name], MD   20  mg at 01/04/20 1029  . feeding supplement (GLUCERNA SHAKE) (GLUCERNA SHAKE) liquid 237 mL  237 mL Oral TID BM Osvaldo Shipper, MD   237 mL at 01/04/20 1029  . guaiFENesin-dextromethorphan (ROBITUSSIN DM) 100-10 MG/5ML syrup 10 mL  10 mL Oral Q4H PRN Alda Ponder A, DO      . insulin aspart (novoLOG) injection 0-20 Units  0-20 Units Subcutaneous TID WC Osvaldo Shipper, MD   7 Units at 01/02/20 1204  . insulin aspart (novoLOG) injection 0-5 Units  0-5 Units Subcutaneous QHS Osvaldo Shipper, MD   2 Units at 01/02/20 2133  . insulin aspart (novoLOG) injection 6 Units  6 Units Subcutaneous TID WC Osvaldo Shipper, MD   6 Units at 01/02/20 1204  . insulin detemir (LEVEMIR)  injection 20 Units  20 Units Subcutaneous BID Hollice Espy, MD   20 Units at 01/03/20 2204  . linagliptin (TRADJENTA) tablet 5 mg  5 mg Oral Daily Alda Ponder A, DO   5 mg at 01/04/20 1029  . naphazoline-pheniramine (NAPHCON-A) 0.025-0.3 % ophthalmic solution 1-2 drop  1-2 drop Both Eyes QID PRN Osvaldo Shipper, MD   2 drop at 12/31/19 2250  . ondansetron (ZOFRAN) tablet 4 mg  4 mg Oral Q6H PRN Alda Ponder A, DO       Or  . ondansetron (ZOFRAN) injection 4 mg  4 mg Intravenous Q6H PRN Alda Ponder A, DO      . polyethylene glycol (MIRALAX / GLYCOLAX) packet 17 g  17 g Oral Daily PRN Alda Ponder A, DO      . pravastatin (PRAVACHOL) tablet 20 mg  20 mg Oral Daily Alda Ponder A, DO   20 mg at 01/04/20 1029  . sodium chloride flush (NS) 0.9 % injection 3 mL  3 mL Intravenous Q12H Alda Ponder A, DO   3 mL at 01/04/20 1030  . zinc sulfate capsule 220 mg  220 mg Oral Daily Alda Ponder A, DO   220 mg at 01/04/20 1029     Discharge Medications: Please see discharge summary for a list of discharge medications.  Relevant Imaging Results:  Relevant Lab Results:   Additional Information ssn:[id number]  Golda Acre, RN

## 2020-01-04 NOTE — Progress Notes (Addendum)
Inpatient Diabetes Program Recommendations  AACE/ADA: New Consensus Statement on Inpatient Glycemic Control (2015)  Target Ranges:  Prepandial:   less than 140 mg/dL      Peak postprandial:   less than 180 mg/dL (1-2 hours)      Critically ill patients:  140 - 180 mg/dL   Lab Results  Component Value Date   GLUCAP 106 (H) 01/04/2020   HGBA1C 12.2 (H) 01/01/2020    Review of Glycemic Control Results for Caitlin Harrington, Caitlin Harrington (MRN [id number]) as of 01/04/2020 10:38  Ref. Range 01/03/2020 20:06 01/04/2020 07:59 01/04/2020 08:27 01/04/2020 09:17  Glucose-Capillary Latest Ref Range: 70 - 99 mg/dL 697 (H) 58 (L) 55 (L) 948 (H)   Diabetes history:DM2 Outpatient Diabetes medications:No current meds (has been on Metformin in the past) Current orders for Inpatient glycemic control:Levemir 20 units bid + Novolog 6 units tid meal coverage + Novolog resistant correction tid + hs 0-5 units + Tradjenta 5 mg qd  Inpatient Diabetes Program Recommendations:   Noted hypoglycemia this AM of 58 mg/dL.  Consider decreasing Levemir to 14 units BID and discontinue Novolog 6 units TID.   Thanks, Lujean Rave, MSN, RNC-OB Diabetes Coordinator [phone] (8a-5p)

## 2020-01-04 NOTE — Plan of Care (Signed)
  Problem: Education: Goal: Knowledge of risk factors and measures for prevention of condition will improve Outcome: Progressing   Problem: Coping: Goal: Psychosocial and spiritual needs will be supported Outcome: Progressing   Problem: Respiratory: Goal: Will maintain a patent airway Outcome: Progressing Goal: Complications related to the disease process, condition or treatment will be avoided or minimized Outcome: Progressing   Problem: Education: Goal: Knowledge of General Education information will improve Description: Including pain rating scale, medication(s)/side effects and non-pharmacologic comfort measures Outcome: Progressing   Problem: Health Behavior/Discharge Planning: Goal: Ability to manage health-related needs will improve Outcome: Progressing   Problem: Clinical Measurements: Goal: Ability to maintain clinical measurements within normal limits will improve Outcome: Progressing Goal: Will remain free from infection Outcome: Progressing Goal: Diagnostic test results will improve Outcome: Progressing Goal: Respiratory complications will improve Outcome: Progressing Goal: Cardiovascular complication will be avoided Outcome: Progressing   Problem: Activity: Goal: Risk for activity intolerance will decrease Outcome: Progressing   Problem: Nutrition: Goal: Adequate nutrition will be maintained Outcome: Progressing   Problem: Coping: Goal: Level of anxiety will decrease Outcome: Progressing   Problem: Elimination: Goal: Will not experience complications related to bowel motility Outcome: Progressing Goal: Will not experience complications related to urinary retention Outcome: Progressing   Problem: Pain Managment: Goal: General experience of comfort will improve Outcome: Progressing   Problem: Safety: Goal: Ability to remain free from injury will improve Outcome: Progressing   Problem: Skin Integrity: Goal: Risk for impaired skin integrity will  decrease Outcome: Progressing   Problem: Education: Goal: Knowledge of General Education information will improve Description: Including pain rating scale, medication(s)/side effects and non-pharmacologic comfort measures Outcome: Progressing   Problem: Health Behavior/Discharge Planning: Goal: Ability to manage health-related needs will improve Outcome: Progressing   Problem: Clinical Measurements: Goal: Ability to maintain clinical measurements within normal limits will improve Outcome: Progressing Goal: Will remain free from infection Outcome: Progressing Goal: Diagnostic test results will improve Outcome: Progressing Goal: Respiratory complications will improve Outcome: Progressing Goal: Cardiovascular complication will be avoided Outcome: Progressing   Problem: Activity: Goal: Risk for activity intolerance will decrease Outcome: Progressing   Problem: Nutrition: Goal: Adequate nutrition will be maintained Outcome: Progressing   Problem: Coping: Goal: Level of anxiety will decrease Outcome: Progressing   Problem: Elimination: Goal: Will not experience complications related to bowel motility Outcome: Progressing Goal: Will not experience complications related to urinary retention Outcome: Progressing   Problem: Pain Managment: Goal: General experience of comfort will improve Outcome: Progressing   Problem: Safety: Goal: Ability to remain free from injury will improve Outcome: Progressing   Problem: Skin Integrity: Goal: Risk for impaired skin integrity will decrease Outcome: Progressing   

## 2020-01-04 NOTE — Progress Notes (Signed)
Physical Therapy Treatment Patient Details Name: Caitlin Harrington MRN: [id number] DOB: 02/07/1946 Today's Date: 01/04/2020    History of Present Illness 74 y.o. female with medical history significant for obesity, hypertension, diabetes gout, hypercholesteremia, asthma, who presented with weakness, shortness of breath, found to have Covid-19.  Chest x-ray was not impressive.  Clinically she was thought to have pneumonia.  Due to her respiratory symptoms and the fact that she is obese which places her at high risk for decompensation, she was hospitalized for further management.     PT Comments    Continued drop in blood pressure in standing but blood pressure normalizes with continued OOB and mobility. O2 saturation drops on room air with mobility. It is stable at rest on room air. Patient continues to require hands on assistance for mobility with use of RW. Patient is at risk for falls. Continued recommendation for skilled PT services and discharge to SNF for STR.    01/04/20 0942  Orthostatic Lying   BP- Lying 139/74  Pulse- Lying 81  Orthostatic Sitting  BP- Sitting (!) 138/95  Pulse- Sitting 87  Orthostatic Standing at 0 minutes  BP- Standing at 0 minutes (!) 87/53  Pulse- Standing at 0 minutes 100  Oxygen Therapy  SpO2 92 %  O2 Device Room Air  Patient Activity (if Appropriate) In bed  Pulse Oximetry Type Intermittent     01/04/20 1001  Vitals  BP 135/71  BP Location Left Arm  BP Method Automatic  Patient Position (if appropriate)  (sitting, post ambulation trial)  Pulse Rate 94  Oxygen Therapy  SpO2 (!) 82 % (HR 94 bpm, post ambulation trial on 2L)  Patient requires approx 3 minutes to recover oxygen saturation to 89% on 2L with seated rest after mobility.  Patient on room air at end of session. At rest, O2 saturation 92%. Nurse informed.    Follow Up Recommendations  SNF;Supervision/Assistance - 24 hour     Equipment Recommendations  Rolling walker with 5"  wheels       Precautions / Restrictions Precautions Precautions: Fall Precaution Comments: orthostatic    Mobility  Bed Mobility Overal bed mobility: Modified Independent Bed Mobility: Supine to Sit     Supine to sit: Modified independent (Device/Increase time)     General bed mobility comments: HOB 24 degrees  Transfers Overall transfer level: Needs assistance Equipment used: Rolling walker (2 wheeled) Transfers: Sit to/from Stand Sit to Stand: Min guard(progressing to cl supervision)         General transfer comment: cues for hand placement with sit>stand using RW from EOB  Ambulation/Gait Ambulation/Gait assistance: Min guard Gait Distance (Feet): 40 Feet(x 2) Assistive device: Rolling walker (2 wheeled)(gait belt) Gait Pattern/deviations: Step-through pattern;Staggering right;Staggering left(decreased gait speed)     General Gait Details: Continued cues for safety with O2 tube mgmt during turns. (time to recover oxygen saturation approx 3 minutes to 89%)          Balance   Sitting-balance support: Feet supported Sitting balance-Leahy Scale: Normal     Standing balance support: Bilateral upper extremity supported Standing balance-Leahy Scale: Good        Cognition Arousal/Alertness: Awake/alert        Exercises Other Exercises Other Exercises: fluter valve x 10, incentive spirometer x 10 reps, high 725  Patient demonstrates good technique without need for cues. Education on frequency to perform.    General Comments General comments (skin integrity, edema, etc.): Patient on room air at rest. She requires suppl  O2 for mobility.      Pertinent Vitals/Pain Pain Assessment: No/denies pain(No complaints of pain. No signs/symptoms of pain.)           PT Goals (current goals can now be found in the care plan section) Acute Rehab PT Goals Patient Stated Goal: Patient understands benefits of going to SNF for STR. Progress towards PT goals:  Progressing toward goals    Frequency    Min 2X/week      PT Plan Current plan remains appropriate       AM-PAC PT "6 Clicks" Mobility   Outcome Measure  Help needed turning from your back to your side while in a flat bed without using bedrails?: None Help needed moving from lying on your back to sitting on the side of a flat bed without using bedrails?: None Help needed moving to and from a bed to a chair (including a wheelchair)?: A Little Help needed standing up from a chair using your arms (e.g., wheelchair or bedside chair)?: A Little Help needed to walk in hospital room?: A Little Help needed climbing 3-5 steps with a railing? : A Little 6 Click Score: 20    End of Session Equipment Utilized During Treatment: Gait belt;Oxygen Activity Tolerance: Patient tolerated treatment well Patient left: in chair;with call bell/phone within reach;with chair alarm set Nurse Communication: Mobility status(BP and O2 response, spoke in person) PT Visit Diagnosis: Unsteadiness on feet (R26.81);Difficulty in walking, not elsewhere classified (R26.2);Muscle weakness (generalized) (M62.81)     Time: 6384-6659 PT Time Calculation (min) (ACUTE ONLY): 47 min  Charges:  $Gait Training: 8-22 mins $Therapeutic Activity: 23-37 mins                     Angelene Giovanni, DPT, PT Acute Rehab [phone]     Angelene Giovanni 01/04/2020, 10:35 AM

## 2020-01-04 NOTE — TOC Initial Note (Addendum)
Transition of Care [facility]) - Initial/Assessment Note    Patient Details  Name: Caitlin Harrington MRN: [id number] Date of Birth: [birth year]  [facility] of [facility] CM/SW Contact:    Golda Acre, RN Phone Number: 01/04/2020, 10:58 AM  Clinical Narrative:                 fl2 sent out to area and surrounding area snf's for review. DR. Evelena Asa has suggested the [place] [facility], tct [facility]/will check for bed and call back. [facility] HAS A BED. Dc summary and snf transfer sent to [facility] via the hub. [facility]will transport patient Med.nec. form printed out to unit and rn notified. Expected Discharge Plan: Skilled Nursing Facility Barriers to Discharge: SNF Pending bed offer   Patient Goals and CMS Choice Patient states their goals for this hospitalization and ongoing recovery are:: to go back home CMS Medicare.gov Compare Post Acute Care list provided to:: Patient    Expected Discharge Plan and Services Expected Discharge Plan: Skilled Nursing Facility   Discharge Planning Services: CM Consult Post Acute Care Choice: Skilled Nursing Facility Living arrangements for the past 2 months: Single Family Home                                      Prior Living Arrangements/Services Living arrangements for the past 2 months: Single Family Home Lives with:: Significant Other Patient language and need for interpreter reviewed:: No Do you feel safe going back to the place where you live?: Yes      Need for Family Participation in Patient Care: Yes (Comment) Care giver support system in place?: Yes (comment)   Criminal Activity/Legal Involvement Pertinent to Current Situation/Hospitalization: No - Comment as needed  Activities of Daily Living Home Assistive Devices/Equipment: None ADL Screening (condition at time of admission) Patient's cognitive ability adequate to safely complete daily activities?: Yes Is the patient deaf or have difficulty hearing?: No Does the  patient have difficulty seeing, even when wearing glasses/contacts?: No Does the patient have difficulty concentrating, remembering, or making decisions?: No Patient able to express need for assistance with ADLs?: Yes Does the patient have difficulty dressing or bathing?: No Independently performs ADLs?: Yes (appropriate for developmental age) Does the patient have difficulty walking or climbing stairs?: No Weakness of Legs: None Weakness of Arms/Hands: None  Permission Sought/Granted Permission sought to share information with : Family Supports Permission granted to share information with : Yes, Verbal Permission Granted              Emotional Assessment Appearance:: Appears stated age     Orientation: : Oriented to Self, Oriented to Place, Oriented to  Time, Oriented to Situation Alcohol / Substance Use: Not Applicable Psych Involvement: No (comment)  Admission diagnosis:  Hypoxia [R09.02] COVID-19 virus infection [U07.1] COVID-19 [U07.1] Patient Active Problem List   Diagnosis Date Noted  . Acute on chronic respiratory failure with hypoxia ([facility]) 01/03/2020  . COVID-19 12/30/2019  . Gout   . HTN (hypertension) 02/18/2011  . Prolonged depressive adjustment reaction 02/18/2011  . Morbid obesity ([facility]) 02/18/2011  . Asthma, mild persistent 02/18/2011  . Vitamin D deficiency 02/18/2011  . Hypercholesteremia 02/18/2011  . DM (diabetes mellitus) ([facility]) 02/18/2011  . Insomnia 02/18/2011  . Stress at work 02/18/2011   PCP:  Ileana Ladd, MD Pharmacy:   [facility] [phone] - [place], [place] - [address] AT [facility] [phone]  [place] [address] Phone: [phone] Fax: [phone]     Social Determinants of Health (SDOH) Interventions    Readmission Risk Interventions No flowsheet data found.

## 2020-01-04 NOTE — Discharge Summary (Signed)
Discharge Summary  Caitlin Harrington [facility]:[id number] DOB: 1946-01-22  PCP: Vernie Shanks, MD  Admit date: 12/30/2019 Discharge date: 01/04/2020  Time spent: 25 minutes   Recommendations for Outpatient Follow-up:  1. New medication: Levemir 20 units subcu twice a day for 5 days and then decrease to 16 units twice a day (increased dose for 5 days as patient is on prednisone taper) 2. New medication: Tradjenta 5 mg p.o. daily 3. New medication: Prednisone taper 4. New medication: Allopurinol 300 mg p.o. daily 5. New medication: Pravastatin 10 mg p.o. daily 6. Patient being discharged to skilled nursing facility 7. Patient tested positive for Covid on 2/5.  She is felt to be contagious until 2/26 8. Patient needs to take the Covid vaccine when it is available to her, no earlier than 3/23, 45 days from her positive Covid test 9. Patient being discharged on oxygen 2 L nasal cannula continuous.  Over time, she may be able to wean off this medication.  Discharge Diagnoses:  Active Hospital Problems   Diagnosis Date Noted  . Acute on chronic respiratory failure with hypoxia ([place]) 01/03/2020  . COVID-19 12/30/2019  . Gout   . HTN (hypertension) 02/18/2011  . DM (diabetes mellitus) ([place]) 02/18/2011  . Hypercholesteremia 02/18/2011  . Asthma, mild persistent 02/18/2011  . Prolonged depressive adjustment reaction 02/18/2011  . Vitamin D deficiency 02/18/2011  . Morbid obesity ([place]) 02/18/2011    Resolved Hospital Problems  No resolved problems to display.    Discharge Condition: Improved, being discharged to skilled nursing  Diet recommendation: Carb modified, low-sodium  Vitals:   01/04/20 0950 01/04/20 1001  BP:  135/71  Pulse:  94  Resp:    Temp:    SpO2: (!) 86% (!) 82%    History of present illness:  74 year old female with past medical history of morbid obesity, hypertension, diabetes and asthma seen by her PCP after having 1 week of cough, shortness of breath with exertion  and aches and tested positive for Covid on 2/5.  She came to the emergency room with worsening symptoms on 2/6 and admitted to the hospitalist service after she was noted to be hypoxic.  Started on IV remdesivir and steroids.   Hospital Course:  Principal Problem:   Acute on chronic respiratory failure with hypoxia (HCC) secondary to COVID-19 in patient with history of mild persistent asthma, which is currently stable: Completed 5-day course of IV Remdisivir on 2/10 and currently on IV steroids.    CRP on day of discharge at 6.3.  Will discharge in a 5-day course of tapering steroids.  Continuing incentive spirometry.    At rest, oxygen saturations dropped to 85% and required 2 L to keep oxygen saturations above 90%.    Patient found to be deconditioned with easily getting episodes of hypoxia.  Recommend she go to short-term skilled nursing which she has been amenable to do.  Bed available on 2/11. Active Problems:   HTN (hypertension): Patient had episodes of orthostatic hypotension and Norvasc was discontinued.  Blood pressure has been much improved since then.    Prolonged depressive adjustment reaction    Morbid obesity ([place]): Meets criteria BMI greater than 35+ history of diabetes and hypertension    Vitamin D deficiency    Hypercholesteremia: Continue statin.    DM (diabetes mellitus) ([place]): Uncontrolled.  Patient has been off of Metformin for months.    A1c notes marked elevation of 12.2.  Patient was placed on Tradjenta, Levemir and sliding scale plus scheduled  NovoLog during her hospitalization.  Upon discharge, she will be discharged on Levemir 20 units subcu twice a day for 5 days (while she is finishing prednisone taper) and then discharged on 16 units twice a day plus daily Tradjenta.  She will follow-up with her PCP to make adjustments accordingly after    Gout: Not been on allopurinol or colchicine for months.  On allopurinol for now.  Uric acid level at  6.4.  Procedures:  None  Consultations:  None  Discharge Exam: BP 135/71 (BP Location: Left Arm)   Pulse 94   Temp 97.6 F (36.4 C) (Oral)   Resp 17   Ht 5' (1.524 m)   Wt 88 kg   SpO2 (!) 82% Comment: HR 94 bpm, post ambulation trial on 2L  BMI 37.87 kg/m   General: Alert and oriented x3, no acute distress Cardiovascular: Regular rate and rhythm, S1-S2 Respiratory: Decreased breath sounds throughout  Discharge Instructions You were cared for by a hospitalist during your hospital stay. If you have any questions about your discharge medications or the care you received while you were in the hospital after you are discharged, you can call the unit and asked to speak with the hospitalist on call if the hospitalist that took care of you is not available. Once you are discharged, your primary care physician will handle any further medical issues. Please note that NO REFILLS for any discharge medications will be authorized once you are discharged, as it is imperative that you return to your primary care physician (or establish a relationship with a primary care physician if you do not have one) for your aftercare needs so that they can reassess your need for medications and monitor your lab values.  Discharge Instructions    Diet - low sodium heart healthy   Complete by: As directed    Increase activity slowly   Complete by: As directed      Allergies as of 01/04/2020      Reactions   Motrin [ibuprofen] Hives   Atorvastatin Hives, Rash   chills   Meloxicam Hives, Rash   Statins Hives, Rash      Medication List    TAKE these medications   albuterol 108 (90 Base) MCG/ACT inhaler Commonly known as: Proventil HFA Inhale 2 puffs into the lungs every 6 (six) hours as needed.   allopurinol 300 MG tablet Commonly known as: ZYLOPRIM Take 1 tablet (300 mg total) by mouth daily. Start taking on: January 05, 2020 What changed:   medication strength  how much to take    insulin detemir 100 UNIT/ML injection Commonly known as: LEVEMIR 20 units SQ twice a day for 5 days, then decrease to 16 units SQ twice a day   linagliptin 5 MG Tabs tablet Commonly known as: TRADJENTA Take 1 tablet (5 mg total) by mouth daily. Start taking on: January 05, 2020   polyethylene glycol 17 g packet Commonly known as: MIRALAX / GLYCOLAX Take 17 g by mouth daily as needed for mild constipation.   pravastatin 10 MG tablet Commonly known as: PRAVACHOL Take 2 tablets (20 mg total) by mouth daily.   predniSONE 10 MG tablet Commonly known as: DELTASONE Take 5 tablets (50 mg total) by mouth daily for 1 day, THEN 4 tablets (40 mg total) daily for 1 day, THEN 3 tablets (30 mg total) daily for 1 day, THEN 2 tablets (20 mg total) daily for 1 day, THEN 1 tablet (10 mg total) daily for 1 day.  Start taking on: January 04, 2020      Allergies  Allergen Reactions  . Motrin [Ibuprofen] Hives  . Atorvastatin Hives and Rash    chills  . Meloxicam Hives and Rash  . Statins Hives and Rash   Follow-up Information    Ileana Ladd, MD Follow up in 6 week(s).   Specialty: Family Medicine Contact information: [address] [place] [address] [phone]            The results of significant diagnostics from this hospitalization (including imaging, microbiology, ancillary and laboratory) are listed below for reference.    Significant Diagnostic Studies: DG Chest Port 1 View  Result Date: 12/30/2019 CLINICAL DATA:  COVID positive.  Weakness, cough EXAM: PORTABLE CHEST 1 VIEW COMPARISON:  [id number] FINDINGS: Mild cardiomegaly. No confluent airspace opacities or effusions. Low lung volumes. No acute bony abnormality. IMPRESSION: Mild cardiomegaly.  Low volumes.  No confluent opacities. Electronically Signed   By: Charlett Nose M.D.   On: 12/30/2019 16:56    Microbiology: Recent Results (from the past 240 hour(s))  SARS CORONAVIRUS 2 (TAT 6-24 HRS) Nasopharyngeal  Nasopharyngeal Swab     Status: Abnormal   Collection Time: 12/30/19  4:51 PM   Specimen: Nasopharyngeal Swab  Result Value Ref Range Status   SARS Coronavirus 2 POSITIVE (A) NEGATIVE Final    Comment: EMAILED LORI B. AT 0110 ON 12/31/2019 BY MESSAN H.  (NOTE) SARS-CoV-2 target nucleic acids are DETECTED. The SARS-CoV-2 RNA is generally detectable in upper and lower respiratory specimens during the acute phase of infection. Positive results are indicative of the presence of SARS-CoV-2 RNA. Clinical correlation with patient history and other diagnostic information is  necessary to determine patient infection status. Positive results do not rule out bacterial infection or co-infection with other viruses.  The expected result is Negative. Fact Sheet for Patients: HairSlick.no Fact Sheet for Healthcare Providers: quierodirigir.com This test is not yet approved or cleared by the [place] FDA and  has been authorized for detection and/or diagnosis of SARS-CoV-2 by FDA under an Emergency Use Authorization (EUA). This EUA will remain  in effect (meaning this test can be used) for the duration of the COVID-19 declaratio n under Section 564(b)(1) of the Act, 21 U.S.C. section 360bbb-3(b)(1), unless the authorization is terminated or revoked sooner. Performed at [facility], [address] [place], [place] [address]   Blood Culture (routine x 2)     Status: None   Collection Time: 12/30/19  4:51 PM   Specimen: BLOOD LEFT HAND  Result Value Ref Range Status   Specimen Description   Final    BLOOD LEFT HAND Performed at [facility], [address], [place], [place] [address]    Special Requests   Final    BOTTLES DRAWN AEROBIC AND ANAEROBIC Blood Culture adequate volume Performed at [facility], [address] [place], [place] [address]    Culture   Final    NO GROWTH 5  DAYS Performed at [facility], [address] [place], [place] [address]    Report Status 01/04/2020 FINAL  Final  Blood Culture (routine x 2)     Status: None   Collection Time: 12/30/19  5:43 PM   Specimen: BLOOD RIGHT HAND  Result Value Ref Range Status   Specimen Description   Final    BLOOD RIGHT HAND Performed at [facility], [address], [place], [place] [address]  Special Requests   Final    BOTTLES DRAWN AEROBIC AND ANAEROBIC Blood Culture results may not be optimal due to an excessive volume of blood received in culture bottles Performed at [facility], [address], [place], [place] [address]    Culture   Final    NO GROWTH 5 DAYS Performed at [facility], [address] [place], [place] [address]    Report Status 01/04/2020 FINAL  Final     Labs: Basic Metabolic Panel: Recent Labs  Lab 12/31/19 0413 01/01/20 0550 01/02/20 0605 01/03/20 0600 01/04/20 0505  NA 132* 132* 136 143 140  K 4.3 4.2 3.4* 3.6 3.8  CL 97* 99 103 109 105  CO2 22 23 24 25 26   GLUCOSE 401* 265* 123* 68* 64*  BUN 25* 41* 43* 32* 23  CREATININE 1.06* 1.09* 1.03* 0.86 0.78  CALCIUM 8.3* 8.7* 8.6* 8.8* 8.5*  MG 2.0 2.3 2.3 2.1 2.0  PHOS 4.0  --   --   --   --    Liver Function Tests: Recent Labs  Lab 12/31/19 0413 01/01/20 0550 01/02/20 0605 01/03/20 0600 01/04/20 0505  AST 59* 55* 43* 49* 39  ALT 37 42 37 33 28  ALKPHOS 74 71 66 67 66  BILITOT 0.6 1.0 0.3 0.3 0.4  PROT 7.3 7.0 6.7 6.6 6.4*  ALBUMIN 2.8* 2.5* 2.6* 2.7* 2.7*   No results for input(s): LIPASE, AMYLASE in the last 168 hours. No results for input(s): AMMONIA in the last 168 hours. CBC: Recent Labs  Lab 12/31/19 0413 01/01/20 0550 01/02/20 0605 01/03/20 0600 01/04/20 0505  WBC 4.6 8.1 10.1 8.2 7.4  NEUTROABS 3.4 5.9 7.6 5.2 5.0  HGB 13.8 14.4 14.4 14.2 12.7  HCT 41.3 41.9 41.5 41.6 39.2  MCV 84.1 82.3 82.8 84.0 85.4  PLT 209 236 251 239 258    Cardiac Enzymes: No results for input(s): CKTOTAL, CKMB, CKMBINDEX, TROPONINI in the last 168 hours. BNP: BNP (last 3 results) Recent Labs    12/31/19 0413  BNP 47.3    ProBNP (last 3 results) No results for input(s): PROBNP in the last 8760 hours.  CBG: Recent Labs  Lab 01/03/20 2006 01/04/20 0759 01/04/20 0827 01/04/20 0917 01/04/20 1210  GLUCAP 117* 58* 55* 106* 166*       Signed:  03/03/20, MD [facility] 01/04/2020, 1:04 PM

## 2020-01-04 NOTE — Discharge Instructions (Signed)
10 Things You Can Do to Manage Your COVID-19 Symptoms at Home If you have possible or confirmed COVID-19: 1. Stay home from work and school. And stay away from other public places. If you must go out, avoid using any kind of public transportation, ridesharing, or taxis. 2. Monitor your symptoms carefully. If your symptoms get worse, call your healthcare provider immediately. 3. Get rest and stay hydrated. 4. If you have a medical appointment, call the healthcare provider ahead of time and tell them that you have or may have COVID-19. 5. For medical emergencies, call 911 and notify the dispatch personnel that you have or may have COVID-19. 6. Cover your cough and sneezes with a tissue or use the inside of your elbow. 7. Wash your hands often with soap and water for at least 20 seconds or clean your hands with an alcohol-based hand sanitizer that contains at least 60% alcohol. 8. As much as possible, stay in a specific room and away from other people in your home. Also, you should use a separate bathroom, if available. If you need to be around other people in or outside of the home, wear a mask. 9. Avoid sharing personal items with other people in your household, like dishes, towels, and bedding. 10. Clean all surfaces that are touched often, like counters, tabletops, and doorknobs. Use household cleaning sprays or wipes according to the label instructions. [email or web address] 05/24/2019 This informati  COVID-19 COVID-19 is a respiratory infection that is caused by a virus called severe acute respiratory syndrome coronavirus 2 (SARS-CoV-2). The disease is also known as coronavirus disease or novel coronavirus. In some people, the virus may not cause any symptoms. In others, it may cause a serious infection. The infection can get worse quickly and can lead to complications, such as:  Pneumonia, or infection of the lungs.  Acute respiratory distress syndrome or ARDS. This is a condition in which  fluid build-up in the lungs prevents the lungs from filling with air and passing oxygen into the blood.  Acute respiratory failure. This is a condition in which there is not enough oxygen passing from the lungs to the body or when carbon dioxide is not passing from the lungs out of the body.  Sepsis or septic shock. This is a serious bodily reaction to an infection.  Blood clotting problems.  Secondary infections due to bacteria or fungus.  Organ failure. This is when your body's organs stop working. The virus that causes COVID-19 is contagious. This means that it can spread from person to person through droplets from coughs and sneezes (respiratory secretions). What are the causes? This illness is caused by a virus. You may catch the virus by:  Breathing in droplets from an infected person. Droplets can be spread by a person breathing, speaking, singing, coughing, or sneezing.  Touching something, like a table or a doorknob, that was exposed to the virus (contaminated) and then touching your mouth, nose, or eyes. What increases the risk? Risk for infection You are more likely to be infected with this virus if you:  Are within 6 feet (2 meters) of a person with COVID-19.  Provide care for or live with a person who is infected with COVID-19.  Spend time in crowded indoor spaces or live in shared housing. Risk for serious illness You are more likely to become seriously ill from the virus if you:  Are 47 years of age or older. The higher your age, the more you are at risk for serious  illness.  Live in a nursing home or long-term care facility.  Have cancer.  Have a long-term (chronic) disease such as: ? Chronic lung disease, including chronic obstructive pulmonary disease or asthma. ? A long-term disease that lowers your body's ability to fight infection (immunocompromised). ? Heart disease, including heart failure, a condition in which the arteries that lead to the heart become  narrow or blocked (coronary artery disease), a disease which makes the heart muscle thick, weak, or stiff (cardiomyopathy). ? Diabetes. ? Chronic kidney disease. ? Sickle cell disease, a condition in which red blood cells have an abnormal "sickle" shape. ? Liver disease.  Are obese. What are the signs or symptoms? Symptoms of this condition can range from mild to severe. Symptoms may appear any time from 2 to 14 days after being exposed to the virus. They include:  A fever or chills.  A cough.  Difficulty breathing.  Headaches, body aches, or muscle aches.  Runny or stuffy (congested) nose.  A sore throat.  New loss of taste or smell. Some people may also have stomach problems, such as nausea, vomiting, or diarrhea. Other people may not have any symptoms of COVID-19. How is this diagnosed? This condition may be diagnosed based on:  Your signs and symptoms, especially if: ? You live in an area with a COVID-19 outbreak. ? You recently traveled to or from an area where the virus is common. ? You provide care for or live with a person who was diagnosed with COVID-19. ? You were exposed to a person who was diagnosed with COVID-19.  A physical exam.  Lab tests, which may include: ? Taking a sample of fluid from the back of your nose and throat (nasopharyngeal fluid), your nose, or your throat using a swab. ? A sample of mucus from your lungs (sputum). ? Blood tests.  Imaging tests, which may include, X-rays, CT scan, or ultrasound. How is this treated? At present, there is no medicine to treat COVID-19. Medicines that treat other diseases are being used on a trial basis to see if they are effective against COVID-19. Your health care provider will talk with you about ways to treat your symptoms. For most people, the infection is mild and can be managed at home with rest, fluids, and over-the-counter medicines. Treatment for a serious infection usually takes places in a hospital  intensive care unit (ICU). It may include one or more of the following treatments. These treatments are given until your symptoms improve.  Receiving fluids and medicines through an IV.  Supplemental oxygen. Extra oxygen is given through a tube in the nose, a face mask, or a hood.  Positioning you to lie on your stomach (prone position). This makes it easier for oxygen to get into the lungs.  Continuous positive airway pressure (CPAP) or bi-level positive airway pressure (BPAP) machine. This treatment uses mild air pressure to keep the airways open. A tube that is connected to a motor delivers oxygen to the body.  Ventilator. This treatment moves air into and out of the lungs by using a tube that is placed in your windpipe.  Tracheostomy. This is a procedure to create a hole in the neck so that a breathing tube can be inserted.  Extracorporeal membrane oxygenation (ECMO). This procedure gives the lungs a chance to recover by taking over the functions of the heart and lungs. It supplies oxygen to the body and removes carbon dioxide. Follow these instructions at home: Lifestyle  If you are  sick, stay home except to get medical care. Your health care provider will tell you how long to stay home. Call your health care provider before you go for medical care.  Rest at home as told by your health care provider.  Do not use any products that contain nicotine or tobacco, such as cigarettes, e-cigarettes, and chewing tobacco. If you need help quitting, ask your health care provider.  Return to your normal activities as told by your health care provider. Ask your health care provider what activities are safe for you. General instructions  Take over-the-counter and prescription medicines only as told by your health care provider.  Drink enough fluid to keep your urine pale yellow.  Keep all follow-up visits as told by your health care provider. This is important. How is this prevented?  There  is no vaccine to help prevent COVID-19 infection. However, there are steps you can take to protect yourself and others from this virus. To protect yourself:   Do not travel to areas where COVID-19 is a risk. The areas where COVID-19 is reported change often. To identify high-risk areas and travel restrictions, check the CDC travel website: StageSync.si  If you live in, or must travel to, an area where COVID-19 is a risk, take precautions to avoid infection. ? Stay away from people who are sick. ? Wash your hands often with soap and water for 20 seconds. If soap and water are not available, use an alcohol-based hand sanitizer. ? Avoid touching your mouth, face, eyes, or nose. ? Avoid going out in public, follow guidance from your state and local health authorities. ? If you must go out in public, wear a cloth face covering or face mask. Make sure your mask covers your nose and mouth. ? Avoid crowded indoor spaces. Stay at least 6 feet (2 meters) away from others. ? Disinfect objects and surfaces that are frequently touched every day. This may include:  Counters and tables.  Doorknobs and light switches.  Sinks and faucets.  Electronics, such as phones, remote controls, keyboards, computers, and tablets. To protect others: If you have symptoms of COVID-19, take steps to prevent the virus from spreading to others.  If you think you have a COVID-19 infection, contact your health care provider right away. Tell your health care team that you think you may have a COVID-19 infection.  Stay home. Leave your house only to seek medical care. Do not use public transport.  Do not travel while you are sick.  Wash your hands often with soap and water for 20 seconds. If soap and water are not available, use alcohol-based hand sanitizer.  Stay away from other members of your household. Let healthy household members care for children and pets, if possible. If you have to care for  children or pets, wash your hands often and wear a mask. If possible, stay in your own room, separate from others. Use a different bathroom.  Make sure that all people in your household wash their hands well and often.  Cough or sneeze into a tissue or your sleeve or elbow. Do not cough or sneeze into your hand or into the air.  Wear a cloth face covering or face mask. Make sure your mask covers your nose and mouth. Where to find more information  Centers for Disease Control and Prevention: StickerEmporium.tn  World Health Organization: https://thompson-craig.com/ Contact a health care provider if:  You live in or have traveled to an area where COVID-19 is a risk  and you have symptoms of the infection.  You have had contact with someone who has COVID-19 and you have symptoms of the infection. Get help right away if:  You have trouble breathing.  You have pain or pressure in your chest.  You have confusion.  You have bluish lips and fingernails.  You have difficulty waking from sleep.  You have symptoms that get worse. These symptoms may represent a serious problem that is an emergency. Do not wait to see if the symptoms will go away. Get medical help right away. Call your local emergency services (911 in the [place] Do not drive yourself to the hospital. Let the emergency medical personnel know if you think you have COVID-19. Summary  COVID-19 is a respiratory infection that is caused by a virus. It is also known as coronavirus disease or novel coronavirus. It can cause serious infections, such as pneumonia, acute respiratory distress syndrome, acute respiratory failure, or sepsis.  The virus that causes COVID-19 is contagious. This means that it can spread from person to person through droplets from breathing, speaking, singing, coughing, or sneezing.  You are more likely to develop a serious illness if you are 69 years of age or older, have a  weak immune system, live in a nursing home, or have chronic disease.  There is no medicine to treat COVID-19. Your health care provider will talk with you about ways to treat your symptoms.  Take steps to protect yourself and others from infection. Wash your hands often and disinfect objects and surfaces that are frequently touched every day. Stay away from people who are sick and wear a mask if you are sick. This information is not intended to replace advice given to you by your health care provider. Make sure you discuss any questions you have with your health care provider. Document Revised: 09/08/2019 Document Reviewed: 12/15/2018 Elsevier Patient Education  2020 Elsevier Inc.  COVID-19: How to Protect Yourself and Others Know how it spreads  There is currently no vaccine to prevent coronavirus disease 2019 (COVID-19).  The best way to prevent illness is to avoid being exposed to this virus.  The virus is thought to spread mainly from person-to-person. ? Between people who are in close contact with one another (within about 6 feet). ? Through respiratory droplets produced when an infected person coughs, sneezes or talks. ? These droplets can land in the mouths or noses of people who are nearby or possibly be inhaled into the lungs. ? COVID-19 may be spread by people who are not showing symptoms. Everyone should Clean your hands often  Wash your hands often with soap and water for at least 20 seconds especially after you have been in a public place, or after blowing your nose, coughing, or sneezing.  If soap and water are not readily available, use a hand sanitizer that contains at least 60% alcohol. Cover all surfaces of your hands and rub them together until they feel dry.  Avoid touching your eyes, nose, and mouth with unwashed hands. Avoid close contact  Limit contact with others as much as possible.  Avoid close contact with people who are sick.  Put distance between  yourself and other people. ? Remember that some people without symptoms may be able to spread virus. ? This is especially important for people who are at higher risk of getting very RetroStamps.it Cover your mouth and nose with a mask when around others  You could spread COVID-19 to others even if you  do not feel sick.  Everyone should wear a mask in public settings and when around people not living in their household, especially when social distancing is difficult to maintain. ? Masks should not be placed on young children under age 50, anyone who has trouble breathing, or is unconscious, incapacitated or otherwise unable to remove the mask without assistance.  The mask is meant to protect other people in case you are infected.  Do NOT use a facemask meant for a Research scientist (physical sciences).  Continue to keep about 6 feet between yourself and others. The mask is not a substitute for social distancing. Cover coughs and sneezes  Always cover your mouth and nose with a tissue when you cough or sneeze or use the inside of your elbow.  Throw used tissues in the trash.  Immediately wash your hands with soap and water for at least 20 seconds. If soap and water are not readily available, clean your hands with a hand sanitizer that contains at least 60% alcohol. Clean and disinfect  Clean AND disinfect frequently touched surfaces daily. This includes tables, doorknobs, light switches, countertops, handles, desks, phones, keyboards, toilets, faucets, and sinks. [email or web address].com  If surfaces are dirty, clean them: Use detergent or soap and water prior to disinfection.  Then, use a household disinfectant. You can see a list of EPA-registered household disinfectants here. [employer]AmericaFlowers.co.uk 07/26/2019 This information is not intended to replace advice given to you by  your health care provider. Make sure you discuss any questions you have with your health care provider. Document Revised: 08/03/2019 Document Reviewed: 06/01/2019 Elsevier Patient Education  2020 [employer].  3 Key Steps to Take While Waiting for Your COVID-19 Test Result To help stop the spread of COVID-19, take these 3 key steps NOW while waiting for your test results: 1. Stay home and monitor your health. Stay home and monitor your health to help protect your friends, family, and others from possibly getting COVID-19 from you. Stay home and away from others:  If possible, stay away from others, especially people who are at higher risk for getting very sick from COVID-19, such as older adults and people with other medical conditions.  If you have been in contact with someone with COVID-19, stay home and away from others for 14 days after your last contact with that person.  If you have a fever, cough or other symptoms of COVID-19, stay home and away from others (except to get medical care). Monitor your health:  Watch for fever, cough, shortness of breath, or other symptoms of COVID-19. Remember, symptoms may appear 2-14 days after exposure to COVID-19 and can include: ? Fever or chills ? Cough ? Shortness of breath or difficulty breathing ? Tiredness ? Muscle or body aches ? Headache ? New loss of taste or smell ? Sore throat ? Congestion or runny nose ? Nausea or vomiting ? Diarrhea 2. Think about the people you have recently been around. If you are diagnosed with COVID-19, a public health worker may call you to check on your health, discuss who you have been around, and ask where you spent time while you may have been able to spread COVID-19 to others. While you wait for your COVID-19 test result, think about everyone you have been around recently. This will be important information to give health workers if your test is positive.  Complete the information on the back of this  page to help you remember everyone you have been around. 3. Answer the phone call  from the health department. If a public health worker calls you, answer the call to help slow the spread of COVID-19 in your community.  Discussions with health department staff are confidential. This means that your personal and medical information will be kept private and only shared with those who may need to know, like your health care provider.  Your name will not be shared with those you came in contact with. The health department will only notify people you were in close contact with (within 6 feet for more than 15 minutes) that they might have been exposed to COVID-19. Think about the people you have recently been around If you test positive and are diagnosed with COVID-19, someone from the health department may call to check-in on your health, discuss who you have been around, and ask where you spent time while you may have been able to spread COVID-19 to others. This form can help you think about people you have recently been around so you will be ready if a public health worker calls you. Things to think about. Have you:  Gone to work or school?  Gotten together with others (eaten out at [employer] restaurant, gone out for drinks, exercised with others or gone to a gym, had friends or family over to your house, volunteered, gone to a party, pool, or park)?  Gone to a store in person (e.g., grocery store, mall)?  Gone to in-person appointments (e.g., salon, barber, doctor's or dentist's office)?  Ridden in a car with others (e.g., [employer] or Lyft) or took public transportation?  Been inside a church, synagogue, mosque or other places of worship? Who lives with  you?  ______________________________________________________________________  ______________________________________________________________________  ______________________________________________________________________  ______________________________________________________________________ Who have you been around (within 6 feet for more than 15 minutes) in the last 10 days? (You may have more people to list than the space provided. If so, write on the front of this sheet or a separate piece of paper.) Name ______________________________________________  Phone number ____________________________________  Date you last saw them _____________________________  Where you last saw them ________________________________________________ Name ______________________________________________  Phone number ____________________________________  Date you last saw them _____________________________  Where you last saw them ________________________________________________ Name ______________________________________________  Phone number ____________________________________  Date you last saw them _____________________________  Where you last saw them ________________________________________________ Name ______________________________________________  Phone number ____________________________________  Date you last saw them _____________________________  Where you last saw them ________________________________________________ Name ______________________________________________  Phone number ____________________________________  Date you last saw them _____________________________  Where you last saw them ________________________________________________ What have you done in the last 10 days with other people? Activity _____________________________________________  Location _________________________________________  Date ____________________________________________ Activity  _____________________________________________  Location _________________________________________  Date ____________________________________________ Activity _____________________________________________  Location _________________________________________  Date ____________________________________________ Activity _____________________________________________  Location _________________________________________  Date ____________________________________________ Activity _____________________________________________  Location _________________________________________  Date ____________________________________________ [email or web address] 06/19/2019 This information is not intended to replace advice given to you by your health care provider. Make sure you discuss any questions you have with your health care provider. Document Revised: 10/26/2019 Document Reviewed: 10/26/2019 Elsevier Patient Education  2020 [employer]   COVID-19 Frequently Asked Questions COVID-19 (coronavirus disease) is an infection that is caused by a large family of viruses. Some viruses cause illness in people and others cause illness in animals like camels, cats, and bats. In some cases, the viruses that cause illness in animals can spread to humans. Where did the coronavirus come from? In December 2019, Armeniahina told the [facility]) of several cases of lung disease (human respiratory illness). These cases were linked to an open seafood and livestock market in the city of [place]. The link to the seafood and livestock market suggests that the virus may have spread from animals to humans. However, since that first outbreak in December, the virus has also been shown to spread from person to person. What is the name of the disease and the virus? Disease name Early on, this disease was called novel coronavirus. This is because scientists determined that the disease was caused by a new (novel)  respiratory virus. The World Health Organization [facility]) has now named the disease COVID-19, or coronavirus disease. Virus name The virus that causes the disease is called severe acute respiratory syndrome coronavirus 2 (SARS-CoV-2). More information on disease and virus naming World Health Organization [facility]): www.who.int/emergencies/diseases/novel-coronavirus-2019/technical-guidance/naming-the-coronavirus-disease-(covid-2019)-and-the-virus-that-causes-it Who is at risk for complications from coronavirus disease? Some people may be at higher risk for complications from coronavirus disease. This includes older adults and people who have chronic diseases, such as heart disease, diabetes, and lung disease. If you are at higher risk for complications, take these extra precautions:  Stay home as much as possible.  Avoid social gatherings and travel.  Avoid close contact with others. Stay at least 6 ft (2 m) away from others, if possible.  Wash your hands often with soap and water for at least 20 seconds.  Avoid touching your face, mouth, nose, or eyes.  Keep supplies on hand at home, such as food, medicine, and cleaning supplies.  If you must go out in public, wear a cloth face covering or face mask. Make sure your mask covers your nose and mouth. How does coronavirus disease spread? The virus that causes coronavirus disease spreads easily from person to person (is contagious). You may catch the virus by:  Breathing in droplets from an infected person. Droplets can be spread by a person breathing, speaking, singing, coughing, or sneezing.  Touching something, like a table or a doorknob, that was exposed to the virus (contaminated) and then touching your mouth, nose, or eyes. Can I get the virus from touching surfaces or objects? There is still a lot that we do not know about the virus that causes coronavirus disease. Scientists are basing a lot of information on what they know about similar  viruses, such as:  Viruses cannot generally survive on surfaces for long. They need a human body (host) to survive.  It is more likely that the virus is spread by close contact with people who are sick (direct contact), such as through: ? Shaking hands or hugging. ? Breathing in respiratory droplets that travel through the air. Droplets can be spread by a person breathing, speaking, singing, coughing, or sneezing.  It is less likely that the virus is spread when a person touches a surface or object that has the virus on it (indirect contact). The virus may be able to enter the body if the person touches a surface or object and then touches his or her face, eyes, nose, or mouth. Can a person spread the virus without having symptoms of the disease? It may be possible for the virus to spread before a person has symptoms of the disease, but this is most likely not the main way the virus is spreading. It is more likely for the virus to spread by being in close contact with people who are sick and breathing in the respiratory droplets spread by a person breathing, speaking, singing, coughing,  or sneezing. What are the symptoms of coronavirus disease? Symptoms vary from person to person and can range from mild to severe. Symptoms may include:  Fever or chills.  Cough.  Difficulty breathing or feeling short of breath.  Headaches, body aches, or muscle aches.  Runny or stuffy (congested) nose.  Sore throat.  New loss of taste or smell.  Nausea, vomiting, or diarrhea. These symptoms can appear anywhere from 2 to 14 days after you have been exposed to the virus. Some people may not have any symptoms. If you develop symptoms, call your health care provider. People with severe symptoms may need hospital care. Should I be tested for this virus? Your health care provider will decide whether to test you based on your symptoms, history of exposure, and your risk factors. How does a health care  provider test for this virus? Health care providers will collect samples to send for testing. Samples may include:  Taking a swab of fluid from the back of your nose and throat, your nose, or your throat.  Taking fluid from the lungs by having you cough up mucus (sputum) into a sterile cup.  Taking a blood sample. Is there a treatment or vaccine for this virus? Currently, there is no vaccine to prevent coronavirus disease. Also, there are no medicines like antibiotics or antivirals to treat the virus. A person who becomes sick is given supportive care, which means rest and fluids. A person may also relieve his or her symptoms by using over-the-counter medicines that treat sneezing, coughing, and runny nose. These are the same medicines that a person takes for the common cold. If you develop symptoms, call your health care provider. People with severe symptoms may need hospital care. What can I do to protect myself and my family from this virus?     You can protect yourself and your family by taking the same actions that you would take to prevent the spread of other viruses. Take the following actions:  Wash your hands often with soap and water for at least 20 seconds. If soap and water are not available, use alcohol-based hand sanitizer.  Avoid touching your face, mouth, nose, or eyes.  Cough or sneeze into a tissue, sleeve, or elbow. Do not cough or sneeze into your hand or the air. ? If you cough or sneeze into a tissue, throw it away immediately and wash your hands.  Disinfect objects and surfaces that you frequently touch every day.  Stay away from people who are sick.  Avoid going out in public, follow guidance from your state and local health authorities.  Avoid crowded indoor spaces. Stay at least 6 ft (2 m) away from others.  If you must go out in public, wear a cloth face covering or face mask. Make sure your mask covers your nose and mouth.  Stay home if you are sick,  except to get medical care. Call your health care provider before you get medical care. Your health care provider will tell you how long to stay home.  Make sure your vaccines are up to date. Ask your health care provider what vaccines you need. What should I do if I need to travel? Follow travel recommendations from your local health authority, the CDC, and WHO. Travel information and advice  Centers for Disease Control and Prevention (CDC): GeminiCard.glwww.cdc.gov/coronavirus/2019-ncov/travelers/index.html  World Health Organization [facility](WHO): PreviewDomains.sewww.who.int/emergencies/diseases/novel-coronavirus-2019/travel-advice Know the risks and take action to protect your health  You are at higher risk of getting coronavirus disease  if you are traveling to areas with an outbreak or if you are exposed to travelers from areas with an outbreak.  Wash your hands often and practice good hygiene to lower the risk of catching or spreading the virus. What should I do if I am sick? General instructions to stop the spread of infection  Wash your hands often with soap and water for at least 20 seconds. If soap and water are not available, use alcohol-based hand sanitizer.  Cough or sneeze into a tissue, sleeve, or elbow. Do not cough or sneeze into your hand or the air.  If you cough or sneeze into a tissue, throw it away immediately and wash your hands.  Stay home unless you must get medical care. Call your health care provider or local health authority before you get medical care.  Avoid public areas. Do not take public transportation, if possible.  If you can, wear a mask if you must go out of the house or if you are in close contact with someone who is not sick. Make sure your mask covers your nose and mouth. Keep your home clean  Disinfect objects and surfaces that are frequently touched every day. This may include: ? Counters and tables. ? Doorknobs and light switches. ? Sinks and faucets. ? Electronics such as  phones, remote controls, keyboards, computers, and tablets.  Wash dishes in hot, soapy water or use a dishwasher. Air-dry your dishes.  Wash laundry in hot water. Prevent infecting other household members  Let healthy household members care for children and pets, if possible. If you have to care for children or pets, wash your hands often and wear a mask.  Sleep in a different bedroom or bed, if possible.  Do not share personal items, such as razors, toothbrushes, deodorant, combs, brushes, towels, and washcloths. Where to find more information Centers for Disease Control and Prevention (CDC)  Information and news updates: CardRetirement.cz World Health Organization [facility])  Information and news updates: AffordableSalon.es  Coronavirus health topic: https://thompson-craig.com/  Questions and answers on COVID-19: kruiseway.com  Global tracker: who.sprinklr.com American Academy of Pediatrics (AAP)  Information for families: www.healthychildren.org/English/health-issues/conditions/chest-lungs/Pages/2019-Novel-Coronavirus.aspx The coronavirus situation is changing rapidly. Check your local health authority website or the CDC and [facility] websites for updates and news. When should I contact a health care provider?  Contact your health care provider if you have symptoms of an infection, such as fever or cough, and you: ? Have been near anyone who is known to have coronavirus disease. ? Have come into contact with a person who is suspected to have coronavirus disease. ? Have traveled to an area where there is an outbreak of COVID-19. When should I get emergency medical care?  Get help right away by calling your local emergency services (911 in the [place] if you have: ? Trouble breathing. ? Pain or pressure in your chest. ? Confusion. ? Blue-tinged lips and fingernails. ? Difficulty waking  from sleep. ? Symptoms that get worse. Let the emergency medical personnel know if you think you have coronavirus disease. Summary  A new respiratory virus is spreading from person to person and causing COVID-19 (coronavirus disease).  The virus that causes COVID-19 appears to spread easily. It spreads from one person to another through droplets from breathing, speaking, singing, coughing, or sneezing.  Older adults and those with chronic diseases are at higher risk of disease. If you are at higher risk for complications, take extra precautions.  There is currently no vaccine to prevent coronavirus  disease. There are no medicines, such as antibiotics or antivirals, to treat the virus.  You can protect yourself and your family by washing your hands often, avoiding touching your face, and covering your coughs and sneezes. This information is not intended to replace advice given to you by your health care provider. Make sure you discuss any questions you have with your health care provider. Document Revised: 09/08/2019 Document Reviewed: 03/07/2019 Elsevier Patient Education  2020 Elsevier Inc.  COVID-19: Quarantine vs. Isolation QUARANTINE keeps someone who was in close contact with someone who has COVID-19 away from others. If you had close contact with a person who has COVID-19  Stay home until 14 days after your last contact.  Check your temperature twice a day and watch for symptoms of COVID-19.  If possible, stay away from people who are at higher-risk for getting very sick from COVID-19. ISOLATION keeps someone who is sick or tested positive for COVID-19 without symptoms away from others, even in their own home. If you are sick and think or know you have COVID-19  Stay home until after ? At least 10 days since symptoms first appeared and ? At least 24 hours with no fever without fever-reducing medication and ? Symptoms have improved If you tested positive for COVID-19 but do  not have symptoms  Stay home until after ? 10 days have passed since your positive test If you live with others, stay in a specific "sick room" or area and away from other people or animals, including pets. Use a separate bathroom, if available. SouthAmericaFlowers.co.uk 06/12/2019 This information is not intended to replace advice given to you by your health care provider. Make sure you discuss any questions you have with your health care provider. Document Revised: 10/26/2019 Document Reviewed: 10/26/2019 Elsevier Patient Education  [employer]. on is not intended to replace advice given to you by your health care provider. Make sure you discuss any questions you have with your health care provider. Document Revised: 10/26/2019 Document Reviewed: 10/26/2019 Elsevier Patient Education  2020 [employer].

## 2020-02-01 DIAGNOSIS — Z23 Encounter for immunization: Secondary | ICD-10-CM | POA: Diagnosis not present

## 2020-03-02 DIAGNOSIS — Z23 Encounter for immunization: Secondary | ICD-10-CM | POA: Diagnosis not present

## 2020-05-02 DIAGNOSIS — L304 Erythema intertrigo: Secondary | ICD-10-CM | POA: Diagnosis not present

## 2020-05-02 DIAGNOSIS — L259 Unspecified contact dermatitis, unspecified cause: Secondary | ICD-10-CM | POA: Diagnosis not present

## 2020-05-12 ENCOUNTER — Emergency Department (HOSPITAL_COMMUNITY): Payer: Medicare Other

## 2020-05-12 ENCOUNTER — Encounter (HOSPITAL_COMMUNITY): Payer: Self-pay | Admitting: Emergency Medicine

## 2020-05-12 ENCOUNTER — Inpatient Hospital Stay (HOSPITAL_COMMUNITY)
Admission: EM | Admit: 2020-05-12 | Discharge: 2020-05-15 | DRG: 689 | Disposition: A | Discharge status: Home Health | Payer: Medicare Other | Attending: Internal Medicine | Admitting: Internal Medicine

## 2020-05-12 DIAGNOSIS — E785 Hyperlipidemia, unspecified: Secondary | ICD-10-CM | POA: Diagnosis present

## 2020-05-12 DIAGNOSIS — J449 Chronic obstructive pulmonary disease, unspecified: Secondary | ICD-10-CM | POA: Diagnosis present

## 2020-05-12 DIAGNOSIS — Z8616 Personal history of COVID-19: Secondary | ICD-10-CM

## 2020-05-12 DIAGNOSIS — E875 Hyperkalemia: Secondary | ICD-10-CM | POA: Diagnosis present

## 2020-05-12 DIAGNOSIS — I639 Cerebral infarction, unspecified: Secondary | ICD-10-CM | POA: Diagnosis not present

## 2020-05-12 DIAGNOSIS — Z79899 Other long term (current) drug therapy: Secondary | ICD-10-CM | POA: Diagnosis not present

## 2020-05-12 DIAGNOSIS — Z794 Long term (current) use of insulin: Secondary | ICD-10-CM | POA: Diagnosis not present

## 2020-05-12 DIAGNOSIS — G92 Toxic encephalopathy: Secondary | ICD-10-CM | POA: Diagnosis not present

## 2020-05-12 DIAGNOSIS — Z90710 Acquired absence of both cervix and uterus: Secondary | ICD-10-CM | POA: Diagnosis not present

## 2020-05-12 DIAGNOSIS — B951 Streptococcus, group B, as the cause of diseases classified elsewhere: Secondary | ICD-10-CM | POA: Diagnosis present

## 2020-05-12 DIAGNOSIS — G9341 Metabolic encephalopathy: Secondary | ICD-10-CM | POA: Diagnosis present

## 2020-05-12 DIAGNOSIS — J4520 Mild intermittent asthma, uncomplicated: Secondary | ICD-10-CM | POA: Diagnosis present

## 2020-05-12 DIAGNOSIS — R29810 Facial weakness: Secondary | ICD-10-CM | POA: Diagnosis present

## 2020-05-12 DIAGNOSIS — N390 Urinary tract infection, site not specified: Principal | ICD-10-CM | POA: Diagnosis present

## 2020-05-12 DIAGNOSIS — R519 Headache, unspecified: Secondary | ICD-10-CM | POA: Diagnosis present

## 2020-05-12 DIAGNOSIS — R739 Hyperglycemia, unspecified: Secondary | ICD-10-CM | POA: Insufficient documentation

## 2020-05-12 DIAGNOSIS — Z9114 Patient's other noncompliance with medication regimen: Secondary | ICD-10-CM | POA: Diagnosis not present

## 2020-05-12 DIAGNOSIS — R4701 Aphasia: Secondary | ICD-10-CM | POA: Diagnosis present

## 2020-05-12 DIAGNOSIS — E1165 Type 2 diabetes mellitus with hyperglycemia: Secondary | ICD-10-CM | POA: Diagnosis present

## 2020-05-12 DIAGNOSIS — M109 Gout, unspecified: Secondary | ICD-10-CM | POA: Diagnosis present

## 2020-05-12 DIAGNOSIS — K429 Umbilical hernia without obstruction or gangrene: Secondary | ICD-10-CM | POA: Diagnosis not present

## 2020-05-12 DIAGNOSIS — Z8673 Personal history of transient ischemic attack (TIA), and cerebral infarction without residual deficits: Secondary | ICD-10-CM | POA: Diagnosis not present

## 2020-05-12 DIAGNOSIS — R29704 NIHSS score 4: Secondary | ICD-10-CM | POA: Diagnosis present

## 2020-05-12 DIAGNOSIS — I160 Hypertensive urgency: Secondary | ICD-10-CM | POA: Diagnosis present

## 2020-05-12 DIAGNOSIS — G9340 Encephalopathy, unspecified: Secondary | ICD-10-CM | POA: Diagnosis present

## 2020-05-12 DIAGNOSIS — R4182 Altered mental status, unspecified: Secondary | ICD-10-CM | POA: Diagnosis not present

## 2020-05-12 DIAGNOSIS — R414 Neurologic neglect syndrome: Secondary | ICD-10-CM

## 2020-05-12 DIAGNOSIS — I6601 Occlusion and stenosis of right middle cerebral artery: Secondary | ICD-10-CM | POA: Diagnosis not present

## 2020-05-12 DIAGNOSIS — I10 Essential (primary) hypertension: Secondary | ICD-10-CM | POA: Diagnosis present

## 2020-05-12 DIAGNOSIS — I6523 Occlusion and stenosis of bilateral carotid arteries: Secondary | ICD-10-CM | POA: Diagnosis not present

## 2020-05-12 DIAGNOSIS — E119 Type 2 diabetes mellitus without complications: Secondary | ICD-10-CM

## 2020-05-12 HISTORY — DX: COVID-19: U07.1

## 2020-05-12 LAB — CBG MONITORING, ED
Glucose-Capillary: 402 mg/dL — ABNORMAL HIGH (ref 70–99)
Glucose-Capillary: 358 mg/dL — ABNORMAL HIGH (ref 70–99)
Glucose-Capillary: 313 mg/dL — ABNORMAL HIGH (ref 70–99)
Glucose-Capillary: 246 mg/dL — ABNORMAL HIGH (ref 70–99)

## 2020-05-12 LAB — APTT: aPTT: 20 seconds — ABNORMAL LOW (ref 24–36)

## 2020-05-12 LAB — URINALYSIS, ROUTINE W REFLEX MICROSCOPIC
Bacteria, UA: NONE SEEN
Bilirubin Urine: NEGATIVE
Glucose, UA: >=500 mg/dL — AB
Ketones, ur: 20 mg/dL — AB
Nitrite: NEGATIVE
Protein, ur: 30 mg/dL — AB
Specific Gravity, Urine: 1.042 — ABNORMAL HIGH (ref 1.005–1.030)
pH: 7 (ref 5.0–8.0)

## 2020-05-12 LAB — I-STAT CHEM 8, ED
BUN: 24 mg/dL — ABNORMAL HIGH (ref 8–23)
Calcium, Ion: 1.21 mmol/L (ref 1.15–1.40)
Chloride: 101 mmol/L (ref 98–111)
Creatinine, Ser: 0.8 mg/dL (ref 0.44–1.00)
Glucose, Bld: 461 mg/dL — ABNORMAL HIGH (ref 70–99)
HCT: 53 % — ABNORMAL HIGH (ref 36.0–46.0)
Hemoglobin: 18 g/dL — ABNORMAL HIGH (ref 12.0–15.0)
Potassium: 5.3 mmol/L — ABNORMAL HIGH (ref 3.5–5.1)
Sodium: 136 mmol/L (ref 135–145)
TCO2: 27 mmol/L (ref 22–32)

## 2020-05-12 LAB — CBC
HCT: 52.2 % — ABNORMAL HIGH (ref 36.0–46.0)
Hemoglobin: 17.4 g/dL — ABNORMAL HIGH (ref 12.0–15.0)
MCH: 28.5 pg (ref 26.0–34.0)
MCHC: 33.3 g/dL (ref 30.0–36.0)
MCV: 85.4 fL (ref 80.0–100.0)
Platelets: 262 10*3/uL (ref 150–400)
RBC: 6.11 MIL/uL — ABNORMAL HIGH (ref 3.87–5.11)
RDW: 12.7 % (ref 11.5–15.5)
WBC: 18.6 10*3/uL — ABNORMAL HIGH (ref 4.0–10.5)
nRBC: 0 % (ref 0.0–0.2)

## 2020-05-12 LAB — COMPREHENSIVE METABOLIC PANEL
ALT: 31 U/L (ref 0–44)
AST: 35 U/L (ref 15–41)
Albumin: 3.7 g/dL (ref 3.5–5.0)
Alkaline Phosphatase: 106 U/L (ref 38–126)
Anion gap: 13 (ref 5–15)
BUN: 19 mg/dL (ref 8–23)
CO2: 25 mmol/L (ref 22–32)
Calcium: 10 mg/dL (ref 8.9–10.3)
Chloride: 98 mmol/L (ref 98–111)
Creatinine, Ser: 0.97 mg/dL (ref 0.44–1.00)
GFR calc Af Amer: >60 mL/min (ref >60)
GFR calc non Af Amer: 59 mL/min — ABNORMAL LOW (ref >60)
Glucose, Bld: 440 mg/dL — ABNORMAL HIGH (ref 70–99)
Potassium: 5.3 mmol/L — ABNORMAL HIGH (ref 3.5–5.1)
Sodium: 136 mmol/L (ref 135–145)
Total Bilirubin: 0.9 mg/dL (ref 0.3–1.2)
Total Protein: 8 g/dL (ref 6.5–8.1)

## 2020-05-12 LAB — HEMOGLOBIN A1C
Hgb A1c MFr Bld: 13.2 % — ABNORMAL HIGH (ref 4.8–5.6)
Mean Plasma Glucose: 332.14 mg/dL

## 2020-05-12 LAB — DIFFERENTIAL
Abs Immature Granulocytes: 0.11 10*3/uL — ABNORMAL HIGH (ref 0.00–0.07)
Basophils Absolute: 0.1 10*3/uL (ref 0.0–0.1)
Basophils Relative: 1 %
Eosinophils Absolute: 0.2 10*3/uL (ref 0.0–0.5)
Eosinophils Relative: 1 %
Immature Granulocytes: 1 %
Lymphocytes Relative: 6 %
Lymphs Abs: 1.2 10*3/uL (ref 0.7–4.0)
Monocytes Absolute: 0.6 10*3/uL (ref 0.1–1.0)
Monocytes Relative: 3 %
Neutro Abs: 16.4 10*3/uL — ABNORMAL HIGH (ref 1.7–7.7)
Neutrophils Relative %: 88 %

## 2020-05-12 LAB — ETHANOL: Alcohol, Ethyl (B): <10 mg/dL (ref <10)

## 2020-05-12 LAB — RAPID URINE DRUG SCREEN, HOSP PERFORMED
Amphetamines: NOT DETECTED
Barbiturates: NOT DETECTED
Benzodiazepines: NOT DETECTED
Cocaine: NOT DETECTED
Opiates: NOT DETECTED
Tetrahydrocannabinol: NOT DETECTED

## 2020-05-12 LAB — PROTIME-INR
INR: 1 (ref 0.8–1.2)
Prothrombin Time: 12.3 seconds (ref 11.4–15.2)

## 2020-05-12 LAB — LACTIC ACID, PLASMA
Lactic Acid, Venous: 2.5 mmol/L (ref 0.5–1.9)
Lactic Acid, Venous: 4.8 mmol/L (ref 0.5–1.9)

## 2020-05-12 LAB — SARS CORONAVIRUS 2 BY RT PCR (HOSPITAL ORDER, PERFORMED IN ~~LOC~~ HOSPITAL LAB): SARS Coronavirus 2: NEGATIVE

## 2020-05-12 MED ORDER — ENOXAPARIN SODIUM 40 MG/0.4ML ~~LOC~~ SOLN
40.0000 mg | SUBCUTANEOUS | Status: DC
  Administered 2020-05-12 – 2020-05-15 (×4): 40 mg via SUBCUTANEOUS
  Filled 2020-05-12 (×4): qty 0.4

## 2020-05-12 MED ORDER — ACETAMINOPHEN 650 MG RE SUPP: 650.0000 mg | Freq: Four times a day (QID) | RECTAL | Status: DC | PRN

## 2020-05-12 MED ORDER — IOHEXOL 350 MG/ML SOLN
100.0000 mL | Freq: Once | INTRAVENOUS | Status: AC | PRN
  Administered 2020-05-12: 100 mL via INTRAVENOUS

## 2020-05-12 MED ORDER — ACETAMINOPHEN 325 MG PO TABS
650.0000 mg | ORAL_TABLET | Freq: Four times a day (QID) | ORAL | Status: DC | PRN
  Administered 2020-05-14: 650 mg via ORAL
  Filled 2020-05-12: qty 2

## 2020-05-12 MED ORDER — SODIUM CHLORIDE 0.9 % IV SOLN
1.0000 g | INTRAVENOUS | Status: DC
  Administered 2020-05-13 – 2020-05-14 (×2): 1 g via INTRAVENOUS
  Filled 2020-05-12 (×2): qty 10

## 2020-05-12 MED ORDER — LINAGLIPTIN 5 MG PO TABS
5.0000 mg | ORAL_TABLET | Freq: Every day | ORAL | Status: DC
  Administered 2020-05-12 – 2020-05-15 (×4): 5 mg via ORAL
  Filled 2020-05-12 (×5): qty 1

## 2020-05-12 MED ORDER — FENTANYL CITRATE (PF) 100 MCG/2ML IJ SOLN
50.0000 ug | Freq: Once | INTRAMUSCULAR | Status: AC
  Administered 2020-05-12: 50 ug via INTRAVENOUS
  Filled 2020-05-12: qty 2

## 2020-05-12 MED ORDER — INSULIN ASPART 100 UNIT/ML ~~LOC~~ SOLN
5.0000 [IU] | Freq: Once | SUBCUTANEOUS | Status: AC
  Administered 2020-05-12: 5 [IU] via INTRAVENOUS

## 2020-05-12 MED ORDER — SODIUM CHLORIDE 0.9% FLUSH
3.0000 mL | Freq: Once | INTRAVENOUS | Status: AC
  Administered 2020-05-12: 3 mL via INTRAVENOUS

## 2020-05-12 MED ORDER — PRAVASTATIN SODIUM 10 MG PO TABS
20.0000 mg | ORAL_TABLET | Freq: Every day | ORAL | Status: DC
  Administered 2020-05-12 – 2020-05-15 (×4): 20 mg via ORAL
  Filled 2020-05-12 (×4): qty 2

## 2020-05-12 MED ORDER — ALBUTEROL SULFATE (2.5 MG/3ML) 0.083% IN NEBU: 2.5000 mg | INHALATION_SOLUTION | Freq: Four times a day (QID) | RESPIRATORY_TRACT | Status: DC | PRN

## 2020-05-12 MED ORDER — IOHEXOL 300 MG/ML  SOLN: 100.0000 mL | Freq: Once | INTRAMUSCULAR | Status: DC | PRN

## 2020-05-12 MED ORDER — LACTATED RINGERS IV BOLUS
1000.0000 mL | Freq: Once | INTRAVENOUS | Status: AC
  Administered 2020-05-12: 1000 mL via INTRAVENOUS

## 2020-05-12 MED ORDER — INSULIN GLARGINE 100 UNIT/ML ~~LOC~~ SOLN
24.0000 [IU] | Freq: Every day | SUBCUTANEOUS | Status: DC
  Filled 2020-05-12: qty 0.24

## 2020-05-12 MED ORDER — SODIUM CHLORIDE 0.9 % IV SOLN: INTRAVENOUS | Status: AC

## 2020-05-12 MED ORDER — POLYETHYLENE GLYCOL 3350 17 G PO PACK: 17.0000 g | PACK | Freq: Every day | ORAL | Status: DC | PRN

## 2020-05-12 MED ORDER — INSULIN ASPART 100 UNIT/ML ~~LOC~~ SOLN
5.0000 [IU] | Freq: Three times a day (TID) | SUBCUTANEOUS | Status: DC
  Administered 2020-05-12 – 2020-05-13 (×2): 5 [IU] via SUBCUTANEOUS

## 2020-05-12 MED ORDER — INSULIN ASPART 100 UNIT/ML ~~LOC~~ SOLN
0.0000 [IU] | Freq: Every day | SUBCUTANEOUS | Status: DC
  Administered 2020-05-12: 2 [IU] via SUBCUTANEOUS
  Administered 2020-05-13: 3 [IU] via SUBCUTANEOUS
  Administered 2020-05-14: 2 [IU] via SUBCUTANEOUS

## 2020-05-12 MED ORDER — INSULIN GLARGINE 100 UNIT/ML ~~LOC~~ SOLN
15.0000 [IU] | Freq: Every day | SUBCUTANEOUS | Status: DC
  Filled 2020-05-12: qty 0.15

## 2020-05-12 MED ORDER — ALLOPURINOL 100 MG PO TABS
300.0000 mg | ORAL_TABLET | Freq: Every day | ORAL | Status: DC
  Administered 2020-05-12 – 2020-05-15 (×4): 300 mg via ORAL
  Filled 2020-05-12 (×3): qty 3
  Filled 2020-05-12: qty 1

## 2020-05-12 MED ORDER — HYDRALAZINE HCL 25 MG PO TABS
25.0000 mg | ORAL_TABLET | Freq: Four times a day (QID) | ORAL | Status: DC | PRN
  Administered 2020-05-13 – 2020-05-14 (×2): 25 mg via ORAL
  Filled 2020-05-12 (×2): qty 1

## 2020-05-12 MED ORDER — SODIUM CHLORIDE 0.9 % IV SOLN
1.0000 g | Freq: Once | INTRAVENOUS | Status: AC
  Administered 2020-05-12: 1 g via INTRAVENOUS
  Filled 2020-05-12: qty 10

## 2020-05-12 MED ORDER — INSULIN ASPART 100 UNIT/ML ~~LOC~~ SOLN: 8.0000 [IU] | Freq: Three times a day (TID) | SUBCUTANEOUS | Status: DC

## 2020-05-12 MED ORDER — INSULIN ASPART 100 UNIT/ML ~~LOC~~ SOLN
0.0000 [IU] | Freq: Three times a day (TID) | SUBCUTANEOUS | Status: DC
  Administered 2020-05-12: 7 [IU] via SUBCUTANEOUS
  Administered 2020-05-13: 3 [IU] via SUBCUTANEOUS
  Administered 2020-05-13: 7 [IU] via SUBCUTANEOUS
  Administered 2020-05-13: 3 [IU] via SUBCUTANEOUS
  Administered 2020-05-13: 7 [IU] via SUBCUTANEOUS
  Administered 2020-05-14 (×2): 1 [IU] via SUBCUTANEOUS
  Administered 2020-05-14: 2 [IU] via SUBCUTANEOUS
  Administered 2020-05-15 (×2): 3 [IU] via SUBCUTANEOUS

## 2020-05-12 NOTE — ED Triage Notes (Addendum)
Pt brought in by husband.  States he found her on toilet this morning with decreased LOC and had vomited on herself.  LKW last night around 9pm.  Pt with L sided drift and inability to name 2 objects.  Names phone and keeps stating watch is a phone.  Pt brought straight back to 31 for Dr. Dalene Seltzer to evaluate.  Code Stroke VAN + activated.  After Code Stroke activation husband reports pt was texting him last night around 9pm and seemed fine but couldn't remember where he went.  States they watched TV last night and that she seemed sleepy.  Unsure now if she was normal at that time.

## 2020-05-12 NOTE — ED Notes (Addendum)
Pt transported to MRI 

## 2020-05-12 NOTE — H&P (Signed)
History and Physical    SHENISE WOLGAMOTT ZOX:[id number] DOB: 1946-01-14 DOA: 05/12/2020  PCP: Ileana Ladd, MD (Confirm with patient/family/NH records and if not entered, this has to be entered at [facility] point of entry) Patient coming from: Home  I have personally briefly reviewed patient's old medical records in [facility]  Chief Complaint: Confusion, urinary frequency lower abdominal pain  HPI: Caitlin Harrington is a 74 y.o. female with medical history significant of IDDM, hypertension, gout, asthma mild intermittent, presented with confusion urinary frequency and lower abdominal pain for one day.  Patient was last seen normal yesterday morning, through the day patient developed urinary frequency with suprapubic abdominal pain, cramping like.  Throughout of day patient developed nausea and frequent vomiting.  No fever or chills.  This morning patient was found by fiance on toilet vomiting and confused, also there was concern about the patient had facial droop and trouble finding words same time.  Fianc called EMS.  Code stroke was called the ED.  According to ED records patient was very confused and had left-sided neglect.  At the time I saw the patient patient was more awake oriented and did not remember she felt very sick this morning and might be very confused as well.  She told me that she feels everything was wrong since yesterday morning when urinary frequency started. ED Course: MRI negative for acute finding.  CTA negative for critical stenosis.  Glucose significantly elevated to 440.  White count 18.6 and hemoglobin 17.4.  Review of Systems: As per HPI otherwise 10 point review of systems negative.    Past Medical History:  Diagnosis Date  . Asthma   . COVID-19   . Depression   . Diabetes mellitus without complication (HCC)   . Fatigue   . Gout   . Hypertension   . Insomnia   . Numbness   . Obesity   . Paresthesia   . Vitamin D deficiency     Past Surgical History:    Procedure Laterality Date  . ABDOMINAL HYSTERECTOMY       reports that she has never smoked. She has never used smokeless tobacco. She reports that she does not use drugs. No history on file for alcohol use.  Allergies  Allergen Reactions  . Motrin [Ibuprofen] Hives  . Atorvastatin Hives and Rash    chills  . Meloxicam Hives and Rash  . Statins Hives and Rash    Family History  Problem Relation Age of Onset  . Diabetes Maternal Grandfather      Prior to Admission medications   Medication Sig Start Date End Date Taking? Authorizing Provider  albuterol (PROVENTIL HFA) 108 (90 Base) MCG/ACT inhaler Inhale 2 puffs into the lungs every 6 (six) hours as needed. 01/04/20  Yes Hollice Espy, MD  allopurinol (ZYLOPRIM) 300 MG tablet Take 1 tablet (300 mg total) by mouth daily. 01/05/20  Yes Hollice Espy, MD  econazole nitrate 1 % cream Apply 1 application topically 2 (two) times daily. 05/02/20  Yes [provider]  linagliptin (TRADJENTA) 5 MG TABS tablet Take 1 tablet (5 mg total) by mouth daily. 01/05/20  Yes Hollice Espy, MD  pravastatin (PRAVACHOL) 10 MG tablet Take 2 tablets (20 mg total) by mouth daily. 01/04/20  Yes Hollice Espy, MD  insulin detemir (LEVEMIR) 100 UNIT/ML injection 20 units SQ twice a day for 5 days, then decrease to 16 units SQ twice a day Patient not taking: Reported on 05/12/2020  01/04/20   Hollice Espy, MD  polyethylene glycol (MIRALAX / GLYCOLAX) 17 g packet Take 17 g by mouth daily as needed for mild constipation. Patient not taking: Reported on 05/12/2020 01/04/20   Hollice Espy, MD    Physical Exam: Vitals:   05/12/20 0903 05/12/20 0911 05/12/20 0916 05/12/20 1200  BP:   (!) 155/114 (!) 179/117  Pulse: (!) 134  (!) 136 (!) 106  Resp: (!) 22  19 13   Temp:      TempSrc:      SpO2: 94%  94% 96%  Weight:  63.5 kg    Height:  5' (1.524 m)      Constitutional: NAD, calm, comfortable Vitals:   05/12/20 0903 05/12/20  0911 05/12/20 0916 05/12/20 1200  BP:   (!) 155/114 (!) 179/117  Pulse: (!) 134  (!) 136 (!) 106  Resp: (!) 22  19 13   Temp:      TempSrc:      SpO2: 94%  94% 96%  Weight:  63.5 kg    Height:  5' (1.524 m)     Eyes: PERRL, lids and conjunctivae normal ENMT: Mucous membranes are moist. Posterior pharynx clear of any exudate or lesions.Normal dentition.  Neck: normal, supple, no masses, no thyromegaly Respiratory: clear to auscultation bilaterally, no wheezing, no crackles. Normal respiratory effort. No accessory muscle use.  Cardiovascular: Regular rate and rhythm, no murmurs / rubs / gallops. No extremity edema. 2+ pedal pulses. No carotid bruits.  Abdomen: mild tenderness on suprapubic area no rebound no guarding, no masses palpated. No hepatosplenomegaly. Bowel sounds positive.  Musculoskeletal: no clubbing / cyanosis. No joint deformity upper and lower extremities. Good ROM, no contractures. Normal muscle tone.  Skin: no rashes, lesions, ulcers. No induration Neurologic: CN 2-12 grossly intact. Sensation intact, DTR normal. Strength 5/5 in all 4.  Psychiatric: Normal judgment and insight. Alert and oriented x 3. Normal mood.    Labs on Admission: I have personally reviewed following labs and imaging studies  CBC: Recent Labs  Lab 05/12/20 0735 05/12/20 0756  WBC 18.6*  --   NEUTROABS 16.4*  --   HGB 17.4* 18.0*  HCT 52.2* 53.0*  MCV 85.4  --   PLT 262  --    Basic Metabolic Panel: Recent Labs  Lab 05/12/20 0735 05/12/20 0756  NA 136 136  K 5.3* 5.3*  CL 98 101  CO2 25  --   GLUCOSE 440* 461*  BUN 19 24*  CREATININE 0.97 0.80  CALCIUM 10.0  --    GFR: Estimated Creatinine Clearance: 53.7 mL/min (by C-G formula based on SCr of 0.8 mg/dL). Liver Function Tests: Recent Labs  Lab 05/12/20 0735  AST 35  ALT 31  ALKPHOS 106  BILITOT 0.9  PROT 8.0  ALBUMIN 3.7   No results for input(s): LIPASE, AMYLASE in the last 168 hours. No results for input(s): AMMONIA  in the last 168 hours. Coagulation Profile: Recent Labs  Lab 05/12/20 0735  INR 1.0   Cardiac Enzymes: No results for input(s): CKTOTAL, CKMB, CKMBINDEX, TROPONINI in the last 168 hours. BNP (last 3 results) No results for input(s): PROBNP in the last 8760 hours. HbA1C: No results for input(s): HGBA1C in the last 72 hours. CBG: Recent Labs  Lab 05/12/20 0740 05/12/20 1210  GLUCAP 402* 358*   Lipid Profile: No results for input(s): CHOL, HDL, LDLCALC, TRIG, CHOLHDL, LDLDIRECT in the last 72 hours. Thyroid Function Tests: No results for input(s): TSH, T4TOTAL, FREET4, T3FREE,  THYROIDAB in the last 72 hours. Anemia Panel: No results for input(s): VITAMINB12, FOLATE, FERRITIN, TIBC, IRON, RETICCTPCT in the last 72 hours. Urine analysis:    Component Value Date/Time   COLORURINE YELLOW 05/12/2020 0955   APPEARANCEUR CLOUDY (A) 05/12/2020 0955   LABSPEC 1.042 (H) 05/12/2020 0955   PHURINE 7.0 05/12/2020 0955   GLUCOSEU >=500 (A) 05/12/2020 0955   HGBUR MODERATE (A) 05/12/2020 0955   BILIRUBINUR NEGATIVE 05/12/2020 0955   BILIRUBINUR neg 09/21/2013 1236   KETONESUR 20 (A) 05/12/2020 0955   PROTEINUR 30 (A) 05/12/2020 0955   UROBILINOGEN negative 09/21/2013 1236   NITRITE NEGATIVE 05/12/2020 0955   LEUKOCYTESUR LARGE (A) 05/12/2020 0955    Radiological Exams on Admission: CT ABDOMEN PELVIS WO CONTRAST  Result Date: 05/12/2020 CLINICAL DATA:  Right lower quadrant abdominal pain EXAM: CT ABDOMEN AND PELVIS WITHOUT CONTRAST TECHNIQUE: Multidetector CT imaging of the abdomen and pelvis was performed following the standard protocol without IV contrast. COMPARISON:  None. FINDINGS: Lower chest: No acute abnormality. Hepatobiliary: Diffusely decreased attenuation of the hepatic parenchyma suggesting hepatic steatosis. No focal hepatic lesion identified. Enhancement of the wall of the gallbladder is likely related to recent contrast study. No hyperdense gallstone. No pericholecystic  inflammatory changes. No biliary dilatation. Pancreas: Mild pancreatic atrophy. No peripancreatic inflammatory changes or ductal dilatation. Spleen: Normal in size without focal abnormality. Adrenals/Urinary Tract: Unremarkable adrenal glands. Excreted contrast within the bilateral renal collecting systems including within the ureters and urinary bladder. There is a small amount of excreted contrast extending from the right renal pelvis along the course of the right renal vein (series 3, image 32; series 6, image 85) possibly reflecting sequela of ruptured right renal calyx. No significant perinephric fluid collection or urinoma. Urinary bladder is moderately distended. No filling defect or other abnormality. No focal renal lesion. Stomach/Bowel: Small hiatal hernia. Stomach appears otherwise within normal limits. Small bowel is unremarkable. No dilated loops of bowel. A normal appendix is present in the right lower quadrant (series 6, image 77). No focal bowel wall thickening. No pericolonic inflammatory changes. Vascular/Lymphatic: Scattered aortoiliac atherosclerotic calcifications without aneurysm. No abdominopelvic lymphadenopathy. Reproductive: Status post hysterectomy. No adnexal masses. Other: Small fat containing umbilical hernia. No free fluid or free air. Musculoskeletal: No acute or significant osseous findings. IMPRESSION: 1. No acute abdominopelvic findings. Normal appendix. 2. There is a small amount of excreted contrast extending from the right renal pelvis along the course of the right renal vein possibly reflecting sequela of ruptured right renal calyx. No significant perinephric fluid collection or urinoma. No nephrolithiasis or evidence of acute obstructive uropathy. Findings are of uncertain clinical significance. 3. Hepatic steatosis. 4. Small hiatal hernia. 5. Aortic atherosclerosis. (ICD10-I70.0). Electronically Signed   By: Davina Poke D.O.   On: 05/12/2020 11:30   CT Code Stroke CTA  Head W/WO contrast  Result Date: 05/12/2020 CLINICAL DATA:  New onset confusion. Word naming difficulties. Left sided drift. EXAM: CT ANGIOGRAPHY HEAD AND NECK CT PERFUSION BRAIN TECHNIQUE: Multidetector CT imaging of the head and neck was performed using the standard protocol during bolus administration of intravenous contrast. Multiplanar CT image reconstructions and MIPs were obtained to evaluate the vascular anatomy. Carotid stenosis measurements (when applicable) are obtained utilizing NASCET criteria, using the distal internal carotid diameter as the denominator. Multiphase CT imaging of the brain was performed following IV bolus contrast injection. Subsequent parametric perfusion maps were calculated using RAPID software. CONTRAST:  138mL OMNIPAQUE IOHEXOL 350 MG/ML SOLN COMPARISON:  None. FINDINGS: CTA NECK  FINDINGS Aortic arch: Atherosclerotic changes are present in the distal arch. Great vessel origins are within normal limits. Three vessel configuration is present. Right carotid system: Right common carotid artery is within normal limits. Mild tortuosity is present. Bifurcation is unremarkable. Moderate tortuosity is present cervical right ICA without significant stenosis. Left carotid system: The left common carotid artery is mildly tortuous. Bifurcation is unremarkable. Moderate ICA tortuosity is present without significant stenosis. Vertebral arteries: The left vertebral artery is the dominant vessel. Both vertebral arteries originate from the subclavian arteries without significant stenosis. No significant stenosis is present in either vertebral artery in the neck. Skeleton: Degenerative endplate changes are most prominent at C5-6 and C6-7. No focal lytic or blastic lesions are present. Other neck: The soft tissues the neck are otherwise unremarkable. Salivary glands are within normal limits. No focal mucosal lesions are present. No significant adenopathy is present. Thyroid is normal. Upper chest:  Ground-glass attenuation is present in the upper lobes bilaterally. No significant consolidation is present. Thoracic inlet is within normal limits. Review of the MIP images confirms the above findings CTA HEAD FINDINGS Anterior circulation: Atherosclerotic changes are present within the cavernous internal carotid arteries bilaterally. Moderate right supraclinoid narrowing is present. No significant stenosis is present in the left through the ICA terminus. Moderate right A1 segment narrowing is present. Mild proximal left M1 segment narrowing is present. Diffuse segmental narrowing is present throughout the ACA and MCA branch vessels. The most proximal MCA stenosis involves the left temporal frontal branch. Posterior circulation: The left vertebral artery is the dominant vessel. PICA origins visualized and normal. Vertebrobasilar junction is normal. The basilar artery is normal. Both posterior cerebral arteries originate from the basilar tip. Moderate narrowing is present in the proximal inferior left P3 segment. Venous sinuses: The dural sinuses are patent. The straight sinus deep cerebral veins are intact. Cortical veins are unremarkable. Anatomic variants: None Review of the MIP images confirms the above findings CT Brain Perfusion Findings: ASPECTS: 10/10 CBF (<30%) Volume: 79mL Perfusion (Tmax>6.0s) volume: 53mL Mismatch Volume: 78mL IMPRESSION: 1. CT perfusion is normal. 2. No emergent large vessel occlusion. 3. Moderate right supraclinoid ICA narrowing. 4. Mild proximal left M1 segment narrowing. 5. Moderate right A1 segment narrowing. 6. Scattered segmental narrowing within branch vessels bilaterally as described. 7. Moderate tortuosity of the cervical internal carotid arteries bilaterally, likely secondary to hypertension. 8. Aortic Atherosclerosis (ICD10-I70.0). These results were called by telephone at the time of interpretation on 05/12/2020 at 8:24am to provider [facility] , who verbally acknowledged  these results. Electronically Signed   By: Marin Roberts M.D.   On: 05/12/2020 08:35   CT Code Stroke CTA Neck W/WO contrast  Result Date: 05/12/2020 CLINICAL DATA:  New onset confusion. Word naming difficulties. Left sided drift. EXAM: CT ANGIOGRAPHY HEAD AND NECK CT PERFUSION BRAIN TECHNIQUE: Multidetector CT imaging of the head and neck was performed using the standard protocol during bolus administration of intravenous contrast. Multiplanar CT image reconstructions and MIPs were obtained to evaluate the vascular anatomy. Carotid stenosis measurements (when applicable) are obtained utilizing NASCET criteria, using the distal internal carotid diameter as the denominator. Multiphase CT imaging of the brain was performed following IV bolus contrast injection. Subsequent parametric perfusion maps were calculated using RAPID software. CONTRAST:  OMNIPAQUE IOHEXOL 350 MG/ML SOLN COMPARISON:  None. FINDINGS: CTA NECK FINDINGS Aortic arch: Atherosclerotic changes are present in the distal arch. Great vessel origins are within normal limits. Three vessel configuration is present. Right carotid system: Right  common carotid artery is within normal limits. Mild tortuosity is present. Bifurcation is unremarkable. Moderate tortuosity is present cervical right ICA without significant stenosis. Left carotid system: The left common carotid artery is mildly tortuous. Bifurcation is unremarkable. Moderate ICA tortuosity is present without significant stenosis. Vertebral arteries: The left vertebral artery is the dominant vessel. Both vertebral arteries originate from the subclavian arteries without significant stenosis. No significant stenosis is present in either vertebral artery in the neck. Skeleton: Degenerative endplate changes are most prominent at C5-6 and C6-7. No focal lytic or blastic lesions are present. Other neck: The soft tissues the neck are otherwise unremarkable. Salivary glands are within normal  limits. No focal mucosal lesions are present. No significant adenopathy is present. Thyroid is normal. Upper chest: Ground-glass attenuation is present in the upper lobes bilaterally. No significant consolidation is present. Thoracic inlet is within normal limits. Review of the MIP images confirms the above findings CTA HEAD FINDINGS Anterior circulation: Atherosclerotic changes are present within the cavernous internal carotid arteries bilaterally. Moderate right supraclinoid narrowing is present. No significant stenosis is present in the left through the ICA terminus. Moderate right A1 segment narrowing is present. Mild proximal left M1 segment narrowing is present. Diffuse segmental narrowing is present throughout the ACA and MCA branch vessels. The most proximal MCA stenosis involves the left temporal frontal branch. Posterior circulation: The left vertebral artery is the dominant vessel. PICA origins visualized and normal. Vertebrobasilar junction is normal. The basilar artery is normal. Both posterior cerebral arteries originate from the basilar tip. Moderate narrowing is present in the proximal inferior left P3 segment. Venous sinuses: The dural sinuses are patent. The straight sinus deep cerebral veins are intact. Cortical veins are unremarkable. Anatomic variants: None Review of the MIP images confirms the above findings CT Brain Perfusion Findings: ASPECTS: 10/10 CBF (<30%) Volume: 0mL Perfusion (Tmax>6.0s) volume: 0mL Mismatch Volume: 0mL IMPRESSION: 1. CT perfusion is normal. 2. No emergent large vessel occlusion. 3. Moderate right supraclinoid ICA narrowing. 4. Mild proximal left M1 segment narrowing. 5. Moderate right A1 segment narrowing. 6. Scattered segmental narrowing within branch vessels bilaterally as described. 7. Moderate tortuosity of the cervical internal carotid arteries bilaterally, likely secondary to hypertension. 8. Aortic Atherosclerosis (ICD10-I70.0). These results were called by  telephone at the time of interpretation on 05/12/2020 at 8:24am to provider [facility] , who verbally acknowledged these results. Electronically Signed   By: Marin Roberts M.D.   On: 05/12/2020 08:35   MR BRAIN WO CONTRAST  Result Date: 05/12/2020 CLINICAL DATA:  Altered mental status.  Left-sided neglect.  Drift. The examination had to be discontinued prior to completion due to severe patient motion and unable to tolerate exam. EXAM: MRI HEAD WITHOUT CONTRAST TECHNIQUE: Multiplanar, multiecho pulse sequences of the brain and surrounding structures were obtained without intravenous contrast. COMPARISON:  CT head without contrast. CT perfusion and CTA head and neck. FINDINGS: Brain: Despite significant motion, 2 axial diffusion sequence is in 1 coronal diffusion sequence demonstrate no acute or subacute infarct. Moderate white matter changes are present. IMPRESSION: Diffusion-weighted images demonstrate no acute or subacute infarct. Moderate white matter disease is present. The above was relayed via text pager to [facility] on 05/12/2020 at 08:50 . Electronically Signed   By: Marin Roberts M.D.   On: 05/12/2020 08:53   CT Code Stroke Cerebral Perfusion with contrast  Result Date: 05/12/2020 CLINICAL DATA:  New onset confusion. Word naming difficulties. Left sided drift. EXAM: CT ANGIOGRAPHY HEAD AND NECK CT  PERFUSION BRAIN TECHNIQUE: Multidetector CT imaging of the head and neck was performed using the standard protocol during bolus administration of intravenous contrast. Multiplanar CT image reconstructions and MIPs were obtained to evaluate the vascular anatomy. Carotid stenosis measurements (when applicable) are obtained utilizing NASCET criteria, using the distal internal carotid diameter as the denominator. Multiphase CT imaging of the brain was performed following IV bolus contrast injection. Subsequent parametric perfusion maps were calculated using RAPID software. CONTRAST:  100mL  OMNIPAQUE IOHEXOL 350 MG/ML SOLN COMPARISON:  None. FINDINGS: CTA NECK FINDINGS Aortic arch: Atherosclerotic changes are present in the distal arch. Great vessel origins are within normal limits. Three vessel configuration is present. Right carotid system: Right common carotid artery is within normal limits. Mild tortuosity is present. Bifurcation is unremarkable. Moderate tortuosity is present cervical right ICA without significant stenosis. Left carotid system: The left common carotid artery is mildly tortuous. Bifurcation is unremarkable. Moderate ICA tortuosity is present without significant stenosis. Vertebral arteries: The left vertebral artery is the dominant vessel. Both vertebral arteries originate from the subclavian arteries without significant stenosis. No significant stenosis is present in either vertebral artery in the neck. Skeleton: Degenerative endplate changes are most prominent at C5-6 and C6-7. No focal lytic or blastic lesions are present. Other neck: The soft tissues the neck are otherwise unremarkable. Salivary glands are within normal limits. No focal mucosal lesions are present. No significant adenopathy is present. Thyroid is normal. Upper chest: Ground-glass attenuation is present in the upper lobes bilaterally. No significant consolidation is present. Thoracic inlet is within normal limits. Review of the MIP images confirms the above findings CTA HEAD FINDINGS Anterior circulation: Atherosclerotic changes are present within the cavernous internal carotid arteries bilaterally. Moderate right supraclinoid narrowing is present. No significant stenosis is present in the left through the ICA terminus. Moderate right A1 segment narrowing is present. Mild proximal left M1 segment narrowing is present. Diffuse segmental narrowing is present throughout the ACA and MCA branch vessels. The most proximal MCA stenosis involves the left temporal frontal branch. Posterior circulation: The left vertebral  artery is the dominant vessel. PICA origins visualized and normal. Vertebrobasilar junction is normal. The basilar artery is normal. Both posterior cerebral arteries originate from the basilar tip. Moderate narrowing is present in the proximal inferior left P3 segment. Venous sinuses: The dural sinuses are patent. The straight sinus deep cerebral veins are intact. Cortical veins are unremarkable. Anatomic variants: None Review of the MIP images confirms the above findings CT Brain Perfusion Findings: ASPECTS: 10/10 CBF (<30%) Volume: 0mL Perfusion (Tmax>6.0s) volume: 0mL Mismatch Volume: 0mL IMPRESSION: 1. CT perfusion is normal. 2. No emergent large vessel occlusion. 3. Moderate right supraclinoid ICA narrowing. 4. Mild proximal left M1 segment narrowing. 5. Moderate right A1 segment narrowing. 6. Scattered segmental narrowing within branch vessels bilaterally as described. 7. Moderate tortuosity of the cervical internal carotid arteries bilaterally, likely secondary to hypertension. 8. Aortic Atherosclerosis (ICD10-I70.0). These results were called by telephone at the time of interpretation on 05/12/2020 at 8:24am to [facility] , who verbally acknowledged these results. Electronically Signed   By: Marin Robertshristopher  Mattern M.D.   On: 05/12/2020 08:35   CT HEAD CODE STROKE WO CONTRAST  Result Date: 05/12/2020 CLINICAL DATA:  Code stroke. Altered mental status. Left-sided drift. Unable to name objects. EXAM: CT HEAD WITHOUT CONTRAST TECHNIQUE: Contiguous axial images were obtained from the base of the skull through the vertex without intravenous contrast. COMPARISON:  CT head without contrast 11/06/2016. FINDINGS: Brain: Interval  infarct involving the right caudate head and internal capsule is not acute. This area is markedly hypodense with volume loss. The insular ribbon is normal bilaterally. Basal ganglia are otherwise intact. Heterogeneous hypoattenuation in the thalami is stable. No acute or focal  cortical deficits are present. No acute hemorrhage or mass lesion is present. The ventricles are proportionate to the degree of atrophy. No significant extraaxial fluid collection is present. Brainstem and cerebellum are stable. Vascular: Atherosclerotic calcifications are present in the cavernous internal carotid arteries and at the dural margin of the vertebral arteries. No hyperdense vessel is present. Skull: Calvarium is intact. No focal lytic or blastic lesions are present. No significant extracranial soft tissue lesion is present. Sinuses/Orbits: The paranasal sinuses and mastoid air cells are clear. Bilateral lens replacements are noted. Globes and orbits are otherwise unremarkable. ASPECTS [facility] Stroke Program Early CT Score) - Ganglionic level infarction (caudate, lentiform nuclei, internal capsule, insula, M1-M3 cortex): 7/7 - Supraganglionic infarction (M4-M6 cortex): 3/3 Total score (0-10 with 10 being normal): IMPRESSION: 1. No acute intracranial abnormality. 2. 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 3. Other remote basal ganglia and thalamic ischemic changes are stable. 4. Stable appearance of diffuse white matter disease. 5. ASPECTS is 10/10 The above was relayed via text pager to Dr. Wilford Corner on 05/12/2020 at 08:02 . Electronically Signed   By: Marin Roberts M.D.   On: 05/12/2020 08:02    EKG: Independently reviewed. LVH  Assessment/Plan Active Problems:   Acute lower UTI   Encephalopathy  (please populate well all problems here in Problem List. (For example, if patient is on BP meds at home and you resume or decide to hold them, it is a problem that needs to be her. Same for CAD, COPD, HLD and so on)  Acute metabolic encephalopathy -Likely from UTI, antibiotic started in ED urine culture pending -Stroke ruled out.  IDDM with hyperglycemia -Likely related to UTI, Lantus 15 unit daily and NovoLog 5 units 3 times daily plus sliding  scale  Leukocytosis -From UTI, likely there is also hemoconcentration as hemoglobin also elevated  Elevated blood pressure -Monitor, consider start BP meds for nephro protection  Gout -Continue allopurinol   DVT prophylaxis: Lovenox Code Status: Full Family Communication: None at bedisde Disposition Plan: Likely will need 1 to 2 days hospital stay for UTI treatment and glucose control Consults called: Neuro Admission status: Tele   Emeline General MD [facility] Pager [phone]   05/12/2020, 1:52 PM

## 2020-05-12 NOTE — Consult Note (Signed)
Neurology Consultation  Reason for Consult: Code stroke Referring Physician: Dr. Gareth Morgan  CC: Left-sided weakness, aphasia  History is obtained from: Chart, patient's fianc-Donald [name]  HPI: Caitlin Harrington is a 74 y.o. female past medical history of asthma, COVID-19 infection in February, depression, diabetes, hypertension, paresthesias, presenting to the emergency room for evaluation of speech difficulties and left-sided weakness. Unclear last known normal.  Ellene Route says that the last he saw her normal was sometime yesterday 05/11/2020 at around 9 AM and then he left to go to [place].  He drives a truck.  She texted him at night and he came back and saw her but something was not right about her.  This morning when she woke up, he noted that she was appearing confused and her speech did not sound right.  She was brought into the ER.  She was seen at triage.  Her blood sugars were in the 400s on arrival.  Systolic blood pressure in the 170s. Due to the concern for left-sided weakness and speech difficulties along with possible neglect, LVO positive code stroke was activated as she was thought to be in the 24-hour window. At baseline, the patient can drive herself and takes care of her ADLs herself although has been having more difficulty lately as she is stopped taking all her medications.    LKW: 9 AM on 05/11/2020 tpa given?: no, outside the window Premorbid modified Rankin scale (mRS): 2   ROS: Patient had a UTI few days ago.  Other than that she has been just feeling generally sick.  No chest pain shortness of breath nausea vomiting. Reports noncompliance to medications.  Past Medical History:  Diagnosis Date   Asthma    COVID-19    Depression    Diabetes mellitus without complication (HCC)    Fatigue    Gout    Hypertension    Insomnia    Numbness    Obesity    Paresthesia    Vitamin D deficiency     Family History  Problem Relation Age of Onset    Diabetes Maternal Grandfather     Social History:   reports that she has never smoked. She has never used smokeless tobacco. She reports that she does not use drugs. No history on file for alcohol use.  Medications  Current Facility-Administered Medications:    sodium chloride flush (NS) 0.9 % injection 3 mL, 3 mL, Intravenous, Once, Gareth Morgan, MD  Current Outpatient Medications:    albuterol (PROVENTIL HFA) 108 (90 Base) MCG/ACT inhaler, Inhale 2 puffs into the lungs every 6 (six) hours as needed., Disp: 6.7 g, Rfl: 1   allopurinol (ZYLOPRIM) 300 MG tablet, Take 1 tablet (300 mg total) by mouth daily., Disp: 30 tablet, Rfl: 1   insulin detemir (LEVEMIR) 100 UNIT/ML injection, 20 units SQ twice a day for 5 days, then decrease to 16 units SQ twice a day, Disp: 10 mL, Rfl: 11   linagliptin (TRADJENTA) 5 MG TABS tablet, Take 1 tablet (5 mg total) by mouth daily., Disp: 30 tablet, Rfl: 1   polyethylene glycol (MIRALAX / GLYCOLAX) 17 g packet, Take 17 g by mouth daily as needed for mild constipation., Disp: 14 each, Rfl: 0   pravastatin (PRAVACHOL) 10 MG tablet, Take 2 tablets (20 mg total) by mouth daily., Disp: 30 tablet, Rfl: 1   Exam: Current vital signs: Temp 98.3 F (36.8 C) (Oral)  Vital signs in last 24 hours: Temp:  [98.3 F (36.8 C)] 98.3 F (  36.8 C) (06/20 0746) General: Awake alert in no distress HEENT: Cephalic atraumatic Lungs: Clear Cardiovascular: Regular rhythm Abdomen soft nontender nondistended Extremities warm well perfused with trace edema Neurological exam Awake alert, able to say her name.  Able to tell me her age-said 74 years old when she is 74 years old. Repetition intact. Naming intact. Fluency intact. She is mildly tremulous and has a little bit of a tremor in her voice but no frank dysarthria. Cranial: Pupils equal round react light, extraocular movements-it appears that she prefers the right side with her gaze but is able to look both sides  without any restriction, double simultaneous stimulation results in some neglect of the left visual stimulus, face appears symmetric, tongue and palate midline. Motor exam: She is antigravity 5/5 with a mild tremor in all four extremities. Sensory exam: Intact to light touch on both sides but definitely neglects the left side on double sound stimulation. Coordination: No obvious dysmetria Gait testing deferred NIH stroke scale 1a Level of Conscious.: 0 1b LOC Questions: 1 1c LOC Commands: 0 2 Best Gaze: 1 3 Visual: 1 4 Facial Palsy: 0 5a Motor Arm - left: 0 5b Motor Arm - Right: 0 6a Motor Leg - Left: 0 6b Motor Leg - Right: 0 7 Limb Ataxia: 0 8 Sensory: 0 9 Best Language: 0 10 Dysarthria: 0 11 Extinct. and Inatten.: 1 TOTAL: 4  Labs I have reviewed labs in epic and the results pertinent to this consultation are:  CBC    Component Value Date/Time   WBC 18.6 (H) 05/12/2020 0735   RBC 6.11 (H) 05/12/2020 0735   HGB 17.4 (H) 05/12/2020 0735   HCT 52.2 (H) 05/12/2020 0735   PLT 262 05/12/2020 0735   MCV 85.4 05/12/2020 0735   MCH 28.5 05/12/2020 0735   MCHC 33.3 05/12/2020 0735   RDW 12.7 05/12/2020 0735   LYMPHSABS 1.2 05/12/2020 0735   MONOABS 0.6 05/12/2020 0735   EOSABS 0.2 05/12/2020 0735   BASOSABS 0.1 05/12/2020 0735    CMP     Component Value Date/Time   NA 140 01/04/2020 0505   NA 140 09/18/2013 1040   K 3.8 01/04/2020 0505   CL 105 01/04/2020 0505   CO2 26 01/04/2020 0505   GLUCOSE 64 (L) 01/04/2020 0505   BUN 23 01/04/2020 0505   BUN 11 09/18/2013 1040   CREATININE 0.78 01/04/2020 0505   CALCIUM 8.5 (L) 01/04/2020 0505   PROT 6.4 (L) 01/04/2020 0505   PROT 7.3 09/18/2013 1040   ALBUMIN 2.7 (L) 01/04/2020 0505   ALBUMIN 4.4 09/18/2013 1040   AST 39 01/04/2020 0505   ALT 28 01/04/2020 0505   ALKPHOS 66 01/04/2020 0505   BILITOT 0.4 01/04/2020 0505   GFRNONAA >60 01/04/2020 0505   GFRAA >60 01/04/2020 0505    Lipid Panel     Component Value  Date/Time   CHOL 219 (H) 09/18/2013 1040   TRIG 45 12/30/2019 1715   HDL 50 09/18/2013 1040   CHOLHDL 4.4 09/18/2013 1040   LDLCALC 142 (H) 09/18/2013 1040     Imaging I have reviewed the images obtained:  CT-scan of the brain with chronic white matter disease. Right basal ganglia infarct that is new since 2017 but appears chronic. Other remote basal ganglion thalamic ischemic changes are stable. Aspects ten. No bleed. CTA head and neck-intracranial atherosclerosis. Right M2 near occlusive stenosis with flow past the stenosis. CT perfusion-no perfusion deficit  MRI examination of the brain-pending  Assessment:  74 year old  brought in for concerns of speech deficits and left-sided weakness. On examination, she did appear to have some neglect of the left side-both visual and sensory. No obvious evidence of aphasia. Mildly tremulous all over along with vocal tremor but no frank dysarthria. Blood sugars were 400s-CBG. CBC with leukocytosis and evidence of hemoconcentration. CMP pending.  Impression: Strokelike symptoms-stroke versus toxic metabolic encephalopathy in the setting of hyperglycemia and possible underlying infection  Recommendations: To rule out a stroke-I would do a stat MRI. There is no evidence of intervening well emergent large vessel occlusion on CTA and perfusion profile has no perfusion deficit and she is not a candidate for thrombectomy. She is outside the window for IV TPA. Management of hyperglycemia per primary team I will update my recommendations after the MRI, which I am going to obtain now.  -- Milon Dikes, MD [facility] Pager: [phone] If 7pm to 7am, please call on call as listed on AMION.   Addendum Stat MRI of the brain extremity motion riddled as patient was unable to tolerate. Grossly no big stroke on the MRI. Likely toxic metabolic encephalopathy.  Recommendations after imaging: Correcting toxic metabolic derangements  primary team as you are.  Check chest x-ray, urinalysis, blood cultures. If patient is not baseline after the correction of derangements, consider an EEG at that time. I am not suspicious for an underlying seizure but seizure could very well have been provoked due to all the toxic metabolic derangements that she has. I have discussed my plan with Dr. Dalene Seltzer in the ER. Neurology will be available as needed.  Please call with questions as needed.   -- Milon Dikes, MD [facility] Pager: [phone] If 7pm to 7am, please call on call as listed on AMION.   CRITICAL CARE ATTESTATION Performed by: Milon Dikes, MD Total critical care time: 70 minutes Critical care time was exclusive of separately billable procedures and treating other patients and/or supervising APPs/Residents/Students Critical care was necessary to treat or prevent imminent or life-threatening deterioration due to strokelike symptoms, emergent stroke evaluation, toxic metabolic encephalopathy. This patient is critically ill and at significant risk for neurological worsening and/or death and care requires constant monitoring. Critical care was time spent personally by me on the following activities: development of treatment plan with patient and/or surrogate as well as nursing, discussions with consultants, evaluation of patient's response to treatment, examination of patient, obtaining history from patient or surrogate, ordering and performing treatments and interventions, ordering and review of laboratory studies, ordering and review of radiographic studies, pulse oximetry, re-evaluation of patient's condition, participation in multidisciplinary rounds and medical decision making of high complexity in the care of this patient.

## 2020-05-12 NOTE — ED Provider Notes (Signed)
[facility] EMERGENCY DEPARTMENT Provider Note   CSN: [id number] Arrival date & time: 05/12/20  4696  An emergency department physician performed an initial assessment on this suspected stroke patient at 0735.  History Chief Complaint  Patient presents with  . Code Stroke    Caitlin Harrington is a 74 y.o. female.  HPI      74 year old female with a history of diabetes, asthma, hypertension, hyperlipidemia, COVID-19 infection in January, presents with concern for altered mental status.  Fiance reports that he last saw her yesterday morning, and she was normal, and was texting with her last night and did not detect abnormalities. He got home from [place] last night and she seemed to be in and out of sleep but thought she was just sleeping. This morning, he found her sitting on the toilet, altered, surrounded by vomit.  She reports nausea, and headache. He reported she seemed to be confused, almost as if she was drunk or had taken a lot of valium. She was slow to respond, seemed to have trouble finding words and needed assistance walking due to generalized weakness. She did not appear to have focal weakness of arm or leg but he had seen her left side droop some and given combination of these symptoms brought her to the ED. In triage, she was noted to have a left sided drift and CODE STROKE was initiated.   Fiance reports he just discovered she has not been taking any of her medications for the last month including her DM medications.  She does report lower abdominal pain but is unable to provide history regarding when it began.  Past Medical History:  Diagnosis Date  . Asthma   . COVID-19   . Depression   . Diabetes mellitus without complication (HCC)   . Fatigue   . Gout   . Hypertension   . Insomnia   . Numbness   . Obesity   . Paresthesia   . Vitamin D deficiency     Patient Active Problem List   Diagnosis Date Noted  . Acute on chronic respiratory  failure with hypoxia (HCC) 01/03/2020  . COVID-19 12/30/2019  . Gout   . HTN (hypertension) 02/18/2011  . Prolonged depressive adjustment reaction 02/18/2011  . Morbid obesity (HCC) 02/18/2011  . Asthma, mild persistent 02/18/2011  . Vitamin D deficiency 02/18/2011  . Hypercholesteremia 02/18/2011  . DM (diabetes mellitus) (HCC) 02/18/2011  . Insomnia 02/18/2011  . Stress at work 02/18/2011    Past Surgical History:  Procedure Laterality Date  . ABDOMINAL HYSTERECTOMY       OB History   No obstetric history on file.     Family History  Problem Relation Age of Onset  . Diabetes Maternal Grandfather     Social History   Tobacco Use  . Smoking status: Never Smoker  . Smokeless tobacco: Never Used  Substance Use Topics  . Alcohol use: Not on file  . Drug use: No    Home Medications Prior to Admission medications   Medication Sig Start Date End Date Taking? Authorizing Provider  albuterol (PROVENTIL HFA) 108 (90 Base) MCG/ACT inhaler Inhale 2 puffs into the lungs every 6 (six) hours as needed. 01/04/20   Hollice Espy, MD  allopurinol (ZYLOPRIM) 300 MG tablet Take 1 tablet (300 mg total) by mouth daily. 01/05/20   Hollice Espy, MD  insulin detemir (LEVEMIR) 100 UNIT/ML injection 20 units SQ twice a day for 5 days, then decrease to 16  units SQ twice a day 01/04/20   Annita Brod, MD  linagliptin (TRADJENTA) 5 MG TABS tablet Take 1 tablet (5 mg total) by mouth daily. 01/05/20   Annita Brod, MD  polyethylene glycol (MIRALAX / GLYCOLAX) 17 g packet Take 17 g by mouth daily as needed for mild constipation. 01/04/20   Annita Brod, MD  pravastatin (PRAVACHOL) 10 MG tablet Take 2 tablets (20 mg total) by mouth daily. 01/04/20   Annita Brod, MD    Allergies    Motrin [ibuprofen], Atorvastatin, Meloxicam, and Statins  Review of Systems   Review of Systems  Unable to perform ROS: Mental status change  Gastrointestinal: Positive for abdominal  pain, nausea and vomiting.  Neurological: Positive for speech difficulty and headaches.  Psychiatric/Behavioral: Positive for confusion.    Physical Exam Updated Vital Signs BP (!) 179/117   Pulse (!) 106   Temp 98.3 F (36.8 C) (Oral)   Resp 13   Ht 5' (1.524 m)   Wt 63.5 kg   SpO2 96%   BMI 27.34 kg/m   Physical Exam Constitutional:      General: She is not in acute distress.    Appearance: She is ill-appearing.  HENT:     Head: Normocephalic and atraumatic.  Eyes:     General: No visual field deficit.    Extraocular Movements: Extraocular movements intact.     Conjunctiva/sclera: Conjunctivae normal.     Pupils: Pupils are equal, round, and reactive to light.  Cardiovascular:     Rate and Rhythm: Normal rate and regular rhythm.     Pulses: Normal pulses.  Pulmonary:     Effort: Pulmonary effort is normal. No respiratory distress.  Abdominal:     Tenderness: There is abdominal tenderness (suprapubic, RLQ).  Musculoskeletal:        General: No swelling or tenderness.     Cervical back: Normal range of motion.  Skin:    General: Skin is warm and dry.     Findings: No erythema or rash.  Neurological:     General: No focal deficit present.     Mental Status: She is alert.     GCS: GCS eye subscore is 4. GCS verbal subscore is 5. GCS motor subscore is 6.     Cranial Nerves: No cranial nerve deficit, dysarthria or facial asymmetry.     Sensory: No sensory deficit.     Motor: No weakness or tremor.     Coordination: Coordination normal. Finger-Nose-Finger Test normal.     Gait: Gait normal.     Comments: Oriented to self Extinction on left, right gaze preference Pronating with left arm but not drift on my exam Question slight left facial droop     ED Results / Procedures / Treatments   Labs (all labs ordered are listed, but only abnormal results are displayed) Labs Reviewed  APTT - Abnormal; Notable for the following components:      Result Value   aPTT 20  (*)    All other components within normal limits  CBC - Abnormal; Notable for the following components:   WBC 18.6 (*)    RBC 6.11 (*)    Hemoglobin 17.4 (*)    HCT 52.2 (*)    All other components within normal limits  DIFFERENTIAL - Abnormal; Notable for the following components:   Neutro Abs 16.4 (*)    Abs Immature Granulocytes 0.11 (*)    All other components within normal limits  COMPREHENSIVE METABOLIC  PANEL - Abnormal; Notable for the following components:   Potassium 5.3 (*)    Glucose, Bld 440 (*)    GFR calc non Af Amer 59 (*)    All other components within normal limits  LACTIC ACID, PLASMA - Abnormal; Notable for the following components:   Lactic Acid, Venous 2.5 (*)    All other components within normal limits  URINALYSIS, ROUTINE W REFLEX MICROSCOPIC - Abnormal; Notable for the following components:   APPearance CLOUDY (*)    Specific Gravity, Urine 1.042 (*)    Glucose, UA >=500 (*)    Hgb urine dipstick MODERATE (*)    Ketones, ur 20 (*)    Protein, ur 30 (*)    Leukocytes,Ua LARGE (*)    All other components within normal limits  I-STAT CHEM 8, ED - Abnormal; Notable for the following components:   Potassium 5.3 (*)    BUN 24 (*)    Glucose, Bld 461 (*)    Hemoglobin 18.0 (*)    HCT 53.0 (*)    All other components within normal limits  CBG MONITORING, ED - Abnormal; Notable for the following components:   Glucose-Capillary 358 (*)    All other components within normal limits  CBG MONITORING, ED - Abnormal; Notable for the following components:   Glucose-Capillary 402 (*)    All other components within normal limits  SARS CORONAVIRUS 2 BY RT PCR (HOSPITAL ORDER, PERFORMED IN [facility])  CULTURE, BLOOD (ROUTINE X 2)  CULTURE, BLOOD (ROUTINE X 2)  PROTIME-INR  LACTIC ACID, PLASMA  RAPID URINE DRUG SCREEN, HOSP PERFORMED  ETHANOL    EKG EKG Interpretation  Date/Time:  Sunday May 12 2020 07:44:24 EDT Ventricular Rate:  90 PR  Interval:    QRS Duration: 79 QT Interval:  340 QTC Calculation: 416 R Axis:   -38 Text Interpretation: Sinus rhythm Left ventricular hypertrophy Inferior infarct, old No significant change since last tracing Confirmed by Alvira Monday ([id number]) on 05/12/2020 7:53:09 AM   Radiology CT ABDOMEN PELVIS WO CONTRAST  Result Date: 05/12/2020 CLINICAL DATA:  Right lower quadrant abdominal pain EXAM: CT ABDOMEN AND PELVIS WITHOUT CONTRAST TECHNIQUE: Multidetector CT imaging of the abdomen and pelvis was performed following the standard protocol without IV contrast. COMPARISON:  None. FINDINGS: Lower chest: No acute abnormality. Hepatobiliary: Diffusely decreased attenuation of the hepatic parenchyma suggesting hepatic steatosis. No focal hepatic lesion identified. Enhancement of the wall of the gallbladder is likely related to recent contrast study. No hyperdense gallstone. No pericholecystic inflammatory changes. No biliary dilatation. Pancreas: Mild pancreatic atrophy. No peripancreatic inflammatory changes or ductal dilatation. Spleen: Normal in size without focal abnormality. Adrenals/Urinary Tract: Unremarkable adrenal glands. Excreted contrast within the bilateral renal collecting systems including within the ureters and urinary bladder. There is a small amount of excreted contrast extending from the right renal pelvis along the course of the right renal vein (series 3, image 32; series 6, image 85) possibly reflecting sequela of ruptured right renal calyx. No significant perinephric fluid collection or urinoma. Urinary bladder is moderately distended. No filling defect or other abnormality. No focal renal lesion. Stomach/Bowel: Small hiatal hernia. Stomach appears otherwise within normal limits. Small bowel is unremarkable. No dilated loops of bowel. A normal appendix is present in the right lower quadrant (series 6, image 77). No focal bowel wall thickening. No pericolonic inflammatory changes.  Vascular/Lymphatic: Scattered aortoiliac atherosclerotic calcifications without aneurysm. No abdominopelvic lymphadenopathy. Reproductive: Status post hysterectomy. No adnexal masses. Other: Small fat containing  umbilical hernia. No free fluid or free air. Musculoskeletal: No acute or significant osseous findings. IMPRESSION: 1. No acute abdominopelvic findings. Normal appendix. 2. There is a small amount of excreted contrast extending from the right renal pelvis along the course of the right renal vein possibly reflecting sequela of ruptured right renal calyx. No significant perinephric fluid collection or urinoma. No nephrolithiasis or evidence of acute obstructive uropathy. Findings are of uncertain clinical significance. 3. Hepatic steatosis. 4. Small hiatal hernia. 5. Aortic atherosclerosis. (ICD10-I70.0). Electronically Signed   By: Duanne GuessNicholas  Plundo D.O.   On: 05/12/2020 11:30   CT Code Stroke CTA Head W/WO contrast  Result Date: 05/12/2020 CLINICAL DATA:  New onset confusion. Word naming difficulties. Left sided drift. EXAM: CT ANGIOGRAPHY HEAD AND NECK CT PERFUSION BRAIN TECHNIQUE: Multidetector CT imaging of the head and neck was performed using the standard protocol during bolus administration of intravenous contrast. Multiplanar CT image reconstructions and MIPs were obtained to evaluate the vascular anatomy. Carotid stenosis measurements (when applicable) are obtained utilizing NASCET criteria, using the distal internal carotid diameter as the denominator. Multiphase CT imaging of the brain was performed following IV bolus contrast injection. Subsequent parametric perfusion maps were calculated using RAPID software. CONTRAST:  100mL OMNIPAQUE IOHEXOL 350 MG/ML SOLN COMPARISON:  None. FINDINGS: CTA NECK FINDINGS Aortic arch: Atherosclerotic changes are present in the distal arch. Great vessel origins are within normal limits. Three vessel configuration is present. Right carotid system: Right common  carotid artery is within normal limits. Mild tortuosity is present. Bifurcation is unremarkable. Moderate tortuosity is present cervical right ICA without significant stenosis. Left carotid system: The left common carotid artery is mildly tortuous. Bifurcation is unremarkable. Moderate ICA tortuosity is present without significant stenosis. Vertebral arteries: The left vertebral artery is the dominant vessel. Both vertebral arteries originate from the subclavian arteries without significant stenosis. No significant stenosis is present in either vertebral artery in the neck. Skeleton: Degenerative endplate changes are most prominent at C5-6 and C6-7. No focal lytic or blastic lesions are present. Other neck: The soft tissues the neck are otherwise unremarkable. Salivary glands are within normal limits. No focal mucosal lesions are present. No significant adenopathy is present. Thyroid is normal. Upper chest: Ground-glass attenuation is present in the upper lobes bilaterally. No significant consolidation is present. Thoracic inlet is within normal limits. Review of the MIP images confirms the above findings CTA HEAD FINDINGS Anterior circulation: Atherosclerotic changes are present within the cavernous internal carotid arteries bilaterally. Moderate right supraclinoid narrowing is present. No significant stenosis is present in the left through the ICA terminus. Moderate right A1 segment narrowing is present. Mild proximal left M1 segment narrowing is present. Diffuse segmental narrowing is present throughout the ACA and MCA branch vessels. The most proximal MCA stenosis involves the left temporal frontal branch. Posterior circulation: The left vertebral artery is the dominant vessel. PICA origins visualized and normal. Vertebrobasilar junction is normal. The basilar artery is normal. Both posterior cerebral arteries originate from the basilar tip. Moderate narrowing is present in the proximal inferior left P3 segment.  Venous sinuses: The dural sinuses are patent. The straight sinus deep cerebral veins are intact. Cortical veins are unremarkable. Anatomic variants: None Review of the MIP images confirms the above findings CT Brain Perfusion Findings: ASPECTS: 10/10 CBF (<30%) Volume: 0mL Perfusion (Tmax>6.0s) volume: 0mL Mismatch Volume: 0mL IMPRESSION: 1. CT perfusion is normal. 2. No emergent large vessel occlusion. 3. Moderate right supraclinoid ICA narrowing. 4. Mild proximal left M1 segment narrowing.  5. Moderate right A1 segment narrowing. 6. Scattered segmental narrowing within branch vessels bilaterally as described. 7. Moderate tortuosity of the cervical internal carotid arteries bilaterally, likely secondary to hypertension. 8. Aortic Atherosclerosis (ICD10-I70.0). These results were called by telephone at the time of interpretation on 05/12/2020 at 8:24am to provider [facility] , who verbally acknowledged these results. Electronically Signed   By: Marin Roberts M.D.   On: 05/12/2020 08:35   CT Code Stroke CTA Neck W/WO contrast  Result Date: 05/12/2020 CLINICAL DATA:  New onset confusion. Word naming difficulties. Left sided drift. EXAM: CT ANGIOGRAPHY HEAD AND NECK CT PERFUSION BRAIN TECHNIQUE: Multidetector CT imaging of the head and neck was performed using the standard protocol during bolus administration of intravenous contrast. Multiplanar CT image reconstructions and MIPs were obtained to evaluate the vascular anatomy. Carotid stenosis measurements (when applicable) are obtained utilizing NASCET criteria, using the distal internal carotid diameter as the denominator. Multiphase CT imaging of the brain was performed following IV bolus contrast injection. Subsequent parametric perfusion maps were calculated using RAPID software. CONTRAST:  OMNIPAQUE IOHEXOL 350 MG/ML SOLN COMPARISON:  None. FINDINGS: CTA NECK FINDINGS Aortic arch: Atherosclerotic changes are present in the distal arch. Great  vessel origins are within normal limits. Three vessel configuration is present. Right carotid system: Right common carotid artery is within normal limits. Mild tortuosity is present. Bifurcation is unremarkable. Moderate tortuosity is present cervical right ICA without significant stenosis. Left carotid system: The left common carotid artery is mildly tortuous. Bifurcation is unremarkable. Moderate ICA tortuosity is present without significant stenosis. Vertebral arteries: The left vertebral artery is the dominant vessel. Both vertebral arteries originate from the subclavian arteries without significant stenosis. No significant stenosis is present in either vertebral artery in the neck. Skeleton: Degenerative endplate changes are most prominent at C5-6 and C6-7. No focal lytic or blastic lesions are present. Other neck: The soft tissues the neck are otherwise unremarkable. Salivary glands are within normal limits. No focal mucosal lesions are present. No significant adenopathy is present. Thyroid is normal. Upper chest: Ground-glass attenuation is present in the upper lobes bilaterally. No significant consolidation is present. Thoracic inlet is within normal limits. Review of the MIP images confirms the above findings CTA HEAD FINDINGS Anterior circulation: Atherosclerotic changes are present within the cavernous internal carotid arteries bilaterally. Moderate right supraclinoid narrowing is present. No significant stenosis is present in the left through the ICA terminus. Moderate right A1 segment narrowing is present. Mild proximal left M1 segment narrowing is present. Diffuse segmental narrowing is present throughout the ACA and MCA branch vessels. The most proximal MCA stenosis involves the left temporal frontal branch. Posterior circulation: The left vertebral artery is the dominant vessel. PICA origins visualized and normal. Vertebrobasilar junction is normal. The basilar artery is normal. Both posterior  cerebral arteries originate from the basilar tip. Moderate narrowing is present in the proximal inferior left P3 segment. Venous sinuses: The dural sinuses are patent. The straight sinus deep cerebral veins are intact. Cortical veins are unremarkable. Anatomic variants: None Review of the MIP images confirms the above findings CT Brain Perfusion Findings: ASPECTS: 10/10 CBF (<30%) Volume: 0mL Perfusion (Tmax>6.0s) volume: 0mL Mismatch Volume: 0mL IMPRESSION: 1. CT perfusion is normal. 2. No emergent large vessel occlusion. 3. Moderate right supraclinoid ICA narrowing. 4. Mild proximal left M1 segment narrowing. 5. Moderate right A1 segment narrowing. 6. Scattered segmental narrowing within branch vessels bilaterally as described. 7. Moderate tortuosity of the cervical internal carotid arteries bilaterally, likely  secondary to hypertension. 8. Aortic Atherosclerosis (ICD10-I70.0). These results were called by telephone at the time of interpretation on 05/12/2020 at 8:24am to provider [facility] , who verbally acknowledged these results. Electronically Signed   By: Marin Roberts M.D.   On: 05/12/2020 08:35   MR BRAIN WO CONTRAST  Result Date: 05/12/2020 CLINICAL DATA:  Altered mental status.  Left-sided neglect.  Drift. The examination had to be discontinued prior to completion due to severe patient motion and unable to tolerate exam. EXAM: MRI HEAD WITHOUT CONTRAST TECHNIQUE: Multiplanar, multiecho pulse sequences of the brain and surrounding structures were obtained without intravenous contrast. COMPARISON:  CT head without contrast. CT perfusion and CTA head and neck. FINDINGS: Brain: Despite significant motion, 2 axial diffusion sequence is in 1 coronal diffusion sequence demonstrate no acute or subacute infarct. Moderate white matter changes are present. IMPRESSION: Diffusion-weighted images demonstrate no acute or subacute infarct. Moderate white matter disease is present. The above was relayed via  text pager to [facility] on 05/12/2020 at 08:50 . Electronically Signed   By: Marin Roberts M.D.   On: 05/12/2020 08:53   CT Code Stroke Cerebral Perfusion with contrast  Result Date: 05/12/2020 CLINICAL DATA:  New onset confusion. Word naming difficulties. Left sided drift. EXAM: CT ANGIOGRAPHY HEAD AND NECK CT PERFUSION BRAIN TECHNIQUE: Multidetector CT imaging of the head and neck was performed using the standard protocol during bolus administration of intravenous contrast. Multiplanar CT image reconstructions and MIPs were obtained to evaluate the vascular anatomy. Carotid stenosis measurements (when applicable) are obtained utilizing NASCET criteria, using the distal internal carotid diameter as the denominator. Multiphase CT imaging of the brain was performed following IV bolus contrast injection. Subsequent parametric perfusion maps were calculated using RAPID software. CONTRAST:  OMNIPAQUE IOHEXOL 350 MG/ML SOLN COMPARISON:  None. FINDINGS: CTA NECK FINDINGS Aortic arch: Atherosclerotic changes are present in the distal arch. Great vessel origins are within normal limits. Three vessel configuration is present. Right carotid system: Right common carotid artery is within normal limits. Mild tortuosity is present. Bifurcation is unremarkable. Moderate tortuosity is present cervical right ICA without significant stenosis. Left carotid system: The left common carotid artery is mildly tortuous. Bifurcation is unremarkable. Moderate ICA tortuosity is present without significant stenosis. Vertebral arteries: The left vertebral artery is the dominant vessel. Both vertebral arteries originate from the subclavian arteries without significant stenosis. No significant stenosis is present in either vertebral artery in the neck. Skeleton: Degenerative endplate changes are most prominent at C5-6 and C6-7. No focal lytic or blastic lesions are present. Other neck: The soft tissues the neck are otherwise  unremarkable. Salivary glands are within normal limits. No focal mucosal lesions are present. No significant adenopathy is present. Thyroid is normal. Upper chest: Ground-glass attenuation is present in the upper lobes bilaterally. No significant consolidation is present. Thoracic inlet is within normal limits. Review of the MIP images confirms the above findings CTA HEAD FINDINGS Anterior circulation: Atherosclerotic changes are present within the cavernous internal carotid arteries bilaterally. Moderate right supraclinoid narrowing is present. No significant stenosis is present in the left through the ICA terminus. Moderate right A1 segment narrowing is present. Mild proximal left M1 segment narrowing is present. Diffuse segmental narrowing is present throughout the ACA and MCA branch vessels. The most proximal MCA stenosis involves the left temporal frontal branch. Posterior circulation: The left vertebral artery is the dominant vessel. PICA origins visualized and normal. Vertebrobasilar junction is normal. The basilar artery is normal. Both posterior  cerebral arteries originate from the basilar tip. Moderate narrowing is present in the proximal inferior left P3 segment. Venous sinuses: The dural sinuses are patent. The straight sinus deep cerebral veins are intact. Cortical veins are unremarkable. Anatomic variants: None Review of the MIP images confirms the above findings CT Brain Perfusion Findings: ASPECTS: 10/10 CBF (<30%) Volume: 0mL Perfusion (Tmax>6.0s) volume: 0mL Mismatch Volume: 0mL IMPRESSION: 1. CT perfusion is normal. 2. No emergent large vessel occlusion. 3. Moderate right supraclinoid ICA narrowing. 4. Mild proximal left M1 segment narrowing. 5. Moderate right A1 segment narrowing. 6. Scattered segmental narrowing within branch vessels bilaterally as described. 7. Moderate tortuosity of the cervical internal carotid arteries bilaterally, likely secondary to hypertension. 8. Aortic Atherosclerosis  (ICD10-I70.0). These results were called by telephone at the time of interpretation on 05/12/2020 at 8:24am to provider [facility] , who verbally acknowledged these results. Electronically Signed   By: Marin Roberts M.D.   On: 05/12/2020 08:35   CT HEAD CODE STROKE WO CONTRAST  Result Date: 05/12/2020 CLINICAL DATA:  Code stroke. Altered mental status. Left-sided drift. Unable to name objects. EXAM: CT HEAD WITHOUT CONTRAST TECHNIQUE: Contiguous axial images were obtained from the base of the skull through the vertex without intravenous contrast. COMPARISON:  CT head without contrast 11/06/2016. FINDINGS: Brain: Interval infarct involving the right caudate head and internal capsule is not acute. This area is markedly hypodense with volume loss. The insular ribbon is normal bilaterally. Basal ganglia are otherwise intact. Heterogeneous hypoattenuation in the thalami is stable. No acute or focal cortical deficits are present. No acute hemorrhage or mass lesion is present. The ventricles are proportionate to the degree of atrophy. No significant extraaxial fluid collection is present. Brainstem and cerebellum are stable. Vascular: Atherosclerotic calcifications are present in the cavernous internal carotid arteries and at the dural margin of the vertebral arteries. No hyperdense vessel is present. Skull: Calvarium is intact. No focal lytic or blastic lesions are present. No significant extracranial soft tissue lesion is present. Sinuses/Orbits: The paranasal sinuses and mastoid air cells are clear. Bilateral lens replacements are noted. Globes and orbits are otherwise unremarkable. ASPECTS [facility] Stroke Program Early CT Score) - Ganglionic level infarction (caudate, lentiform nuclei, internal capsule, insula, M1-M3 cortex): 7/7 - Supraganglionic infarction (M4-M6 cortex): 3/3 Total score (0-10 with 10 being normal): IMPRESSION: 1. No acute intracranial abnormality. 2. 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 3. Other remote basal ganglia and thalamic ischemic changes are stable. 4. Stable appearance of diffuse white matter disease. 5. ASPECTS is 10/10 The above was relayed via text pager to Dr. Wilford Corner on 05/12/2020 at 08:02 . Electronically Signed   By: Marin Roberts M.D.   On: 05/12/2020 08:02    Procedures .Critical Care Performed by: Alvira Monday, MD Authorized by: Alvira Monday, MD   Critical care provider statement:    Critical care time (minutes):  60   Critical care was necessary to treat or prevent imminent or life-threatening deterioration of the following conditions:  CNS failure or compromise   Critical care was time spent personally by me on the following activities:  Discussions with consultants, ordering and review of laboratory studies and ordering and review of radiographic studies   (including critical care time)  Medications Ordered in ED Medications  iohexol (OMNIPAQUE) 300 MG/ML solution 100 mL (has no administration in time range)  sodium chloride flush (NS) 0.9 % injection 3 mL (3 mLs Intravenous Given 05/12/20 0800)  iohexol (OMNIPAQUE) 350  MG/ML injection 100 mL (100 mLs Intravenous Contrast Given 05/12/20 0809)  fentaNYL (SUBLIMAZE) injection 50 mcg (50 mcg Intravenous Given 05/12/20 0948)  insulin aspart (novoLOG) injection 5 Units (5 Units Intravenous Given 05/12/20 1119)  cefTRIAXone (ROCEPHIN) 1 g in sodium chloride 0.9 % 100 mL IVPB (0 g Intravenous Stopped 05/12/20 1203)  lactated ringers bolus 1,000 mL (1,000 mLs Intravenous New Bag/Given 05/12/20 1117)  lactated ringers bolus 1,000 mL (1,000 mLs Intravenous New Bag/Given 05/12/20 1116)    ED Course  I have reviewed the triage vital signs and the nursing notes.  Pertinent labs & imaging results that were available during my care of the patient were reviewed by me and considered in my medical decision making (see chart for details).    MDM Rules/Calculators/A&P                           74 year old female with a history of diabetes, asthma, hypertension, hyperlipidemia, COVID-19 infection in January, presents with concern for altered mental status.  CODE STROKE called from triage as left sided droop, concern for aphasia--has generalized weakness on my exam but also neglect. Dr. Wilford Corner from Neurology came to bedside for evaluation.  CT without acute findings, no findings on perfusion or large vessel occlusion but diffuse scattered areas of narrowing on CTA. MRI limited but without signs of CVA.   Labs significant for hyperglycemia, leukocytosis.  No sign of DKA.  Consider HHS.  On my reevaluation, she reports lower abdominal pain, although she is unable to provide much more history.  She does have right-sided tenderness on exam, CT was ordered for further evaluation which showed no evidence of appendicitis.  Her urinalysis does look concerning for urinary tract infection, and feel this is the likely cause of her lower abdominal pain, nausea and vomiting.  Given Rocephin and IV fluids.  Suspect likely metabolic etiology of her initial neurologic deficits and encephalopathy.  Will admit for continued care of encephalopathy, hyperglycemia and urinary tract infection. Given insulin and rocephin.  Consider HHS although glucose not as high as would typically see.   Final Clinical Impression(s) / ED Diagnoses Final diagnoses:  Altered mental status, unspecified altered mental status type  Hyperglycemia  Urinary tract infection without hematuria, site unspecified  Left-sided neglect    Rx / DC Orders ED Discharge Orders    None       Alvira Monday, MD 05/12/20 1235

## 2020-05-12 NOTE — ED Notes (Signed)
Pt's CBG result was 313. Informed [place] - RN.

## 2020-05-12 NOTE — ED Notes (Addendum)
Pt complains of abdominal pain, unable to rate, continues to state "it hurts so bad." Continues not to follow commands to not remove clothes and to stay in bed. Attempted bathroom and position change with no change. EDP notified, orders given for pain medication, will reassess needs for behavioral medication based on response to pain medication.

## 2020-05-12 NOTE — ED Notes (Signed)
Pt less active in bed, not trying to release side rail of stretcher or remove clothes at this time

## 2020-05-12 NOTE — ED Notes (Signed)
Pt son Alma Downs [phone]) would like to be contacted with updates

## 2020-05-12 NOTE — ED Notes (Signed)
Pt is more compliant with staying in bed but is still unable to follow directions to use purewick or bedpan. Even with a pad applied, attempts to walk pt to restroom result in pt urinating on floor. Pt removes purewick at times. Sheets changed and purewick reapplied

## 2020-05-12 NOTE — ED Notes (Signed)
Pt held temporarily from CT for vital signs

## 2020-05-12 NOTE — ED Notes (Signed)
Pt following commands more consistently, still not oriented enough to environment to use call bell for needs

## 2020-05-12 NOTE — ED Notes (Addendum)
Pt desat to 89% after receiving fentanyl, 95% achieved with 1L O2 [place]

## 2020-05-12 NOTE — ED Notes (Signed)
Pt not following commands well, attempts to get out of bed, neuro would not like to provide ativan at this time (also banging on MRI machine). Assited to use bedpan successfully, still attempting to lower rails. Purewick provided. Urine sample available.

## 2020-05-13 DIAGNOSIS — G9340 Encephalopathy, unspecified: Secondary | ICD-10-CM

## 2020-05-13 LAB — CBC
HCT: 41.9 % (ref 36.0–46.0)
Hemoglobin: 14 g/dL (ref 12.0–15.0)
MCH: 28.5 pg (ref 26.0–34.0)
MCHC: 33.4 g/dL (ref 30.0–36.0)
MCV: 85.2 fL (ref 80.0–100.0)
Platelets: 258 10*3/uL (ref 150–400)
RBC: 4.92 MIL/uL (ref 3.87–5.11)
RDW: 13.1 % (ref 11.5–15.5)
WBC: 19.6 10*3/uL — ABNORMAL HIGH (ref 4.0–10.5)
nRBC: 0 % (ref 0.0–0.2)

## 2020-05-13 LAB — BASIC METABOLIC PANEL
Anion gap: 12 (ref 5–15)
BUN: 18 mg/dL (ref 8–23)
CO2: 23 mmol/L (ref 22–32)
Calcium: 8.5 mg/dL — ABNORMAL LOW (ref 8.9–10.3)
Chloride: 103 mmol/L (ref 98–111)
Creatinine, Ser: 1.02 mg/dL — ABNORMAL HIGH (ref 0.44–1.00)
GFR calc Af Amer: >60 mL/min (ref >60)
GFR calc non Af Amer: 55 mL/min — ABNORMAL LOW (ref >60)
Glucose, Bld: 229 mg/dL — ABNORMAL HIGH (ref 70–99)
Potassium: 3.2 mmol/L — ABNORMAL LOW (ref 3.5–5.1)
Sodium: 138 mmol/L (ref 135–145)

## 2020-05-13 LAB — GLUCOSE, CAPILLARY
Glucose-Capillary: 227 mg/dL — ABNORMAL HIGH (ref 70–99)
Glucose-Capillary: 223 mg/dL — ABNORMAL HIGH (ref 70–99)
Glucose-Capillary: 321 mg/dL — ABNORMAL HIGH (ref 70–99)
Glucose-Capillary: 268 mg/dL — ABNORMAL HIGH (ref 70–99)

## 2020-05-13 LAB — LACTIC ACID, PLASMA: Lactic Acid, Venous: 1.7 mmol/L (ref 0.5–1.9)

## 2020-05-13 MED ORDER — INSULIN GLARGINE 100 UNIT/ML ~~LOC~~ SOLN
15.0000 [IU] | Freq: Two times a day (BID) | SUBCUTANEOUS | Status: DC
  Administered 2020-05-13 – 2020-05-15 (×5): 15 [IU] via SUBCUTANEOUS
  Filled 2020-05-13 (×6): qty 0.15

## 2020-05-13 MED ORDER — INSULIN STARTER KIT- PEN NEEDLES (ENGLISH)
1.0000 | Freq: Once | Status: DC
  Filled 2020-05-13: qty 1

## 2020-05-13 MED ORDER — LIVING WELL WITH DIABETES BOOK
Freq: Once | Status: DC
  Filled 2020-05-13: qty 1

## 2020-05-13 MED ORDER — CARVEDILOL 3.125 MG PO TABS
3.1250 mg | ORAL_TABLET | Freq: Two times a day (BID) | ORAL | Status: DC
  Administered 2020-05-13 – 2020-05-15 (×6): 3.125 mg via ORAL
  Filled 2020-05-13 (×6): qty 1

## 2020-05-13 NOTE — Progress Notes (Addendum)
Inpatient Diabetes Program Recommendations  AACE/ADA: New Consensus Statement on Inpatient Glycemic Control (2015)  Target Ranges:  Prepandial:   less than 140 mg/dL      Peak postprandial:   less than 180 mg/dL (1-2 hours)      Critically ill patients:  140 - 180 mg/dL   Lab Results  Component Value Date   GLUCAP 223 (H) 05/13/2020   HGBA1C 13.2 (H) 05/12/2020    Review of Glycemic Control  Results for KEANU, FRICKEY (MRN [id number]) as of 05/13/2020 15:16  Ref. Range 05/12/2020 12:10 05/12/2020 16:19 05/12/2020 21:51 05/13/2020 06:18 05/13/2020 11:10  Glucose-Capillary Latest Ref Range: 70 - 99 mg/dL 358 (H) 313 (H) 246 (H) 227 (H) 223 (H)   Diabetes history:  DM2  Outpatient Diabetes medications:  Lantus 16 units bid-not taking Trajenta 5 mg daily-not taking  Current orders for Inpatient glycemic control:  Lantus 15 units daily  Novolog 0-9 units tid  Novolog 0-5 units qhs Trajenta 5 mg daily  Note:  Reviewed patient's current A1c of 13.2% (average blood sugar of 332 mg/dl). Explained what a A1c is and what it measures. Also reviewed goal A1c with patient, importance of good glucose control @ home, and blood sugar goals.  She was hospitalized in February and discharged to SNF.  She was discharged on insulin and once she was discharged from SNF she stopped taking her diabetes medication.  Her PCP is Dr. [name] with [facility], however, hasn't seen her in close to 1 yr.  She was prescribed Metformin by her PCP and took this medication for about 1 year and did not like the way it upset her stomach so she stopped.    Educated patient on DM2, short and long complications and patho.  She drinks a lot of regular sodas and sweet tea.  She does not watch her CHO intake.  Educated patient on CHO's, goal per meal & The Plate Method. Ordered Living Well with DM booklet, RD consult, education attached to AVS and asked Sonya, RN to allow patient to check her own blood sugar and administer  insulin.    She does not have a glucometer at home.  She used a [employer] at one time which she really liked.  Will ask MD for order to place a Freestyle Libre so we can educate her on it while inpatient.  We will still need to check cbg's while inpatient.    Educated patient on insulin pen use at home. Reviewed contents of insulin flexpen starter kit. Reviewed all steps of insulin pen including attachment of needle, 2-unit air shot, dialing up dose, giving injection, removing needle, disposal of sharps, storage of unused insulin, disposal of insulin etc. Patient able to provide successful return demonstration. Also reviewed troubleshooting with insulin pen. MD to give patient Rxs for insulin pens and insulin pen needles.  Reviewed hypoglycemia symptoms and treatment.  Will need to follow up with PCP in 1 week after discharge.  Will follow up with patient tomorrow.  MD please order for DC...  Glucometer # [id number] Pen needles # [id number] Freestyle Libre 2 sensor order# [id number] [facility] 2 reader order # [id number]  Will continue to follow while inpatient.  Thank you, Reche Dixon, RN, BSN Diabetes Coordinator Inpatient Diabetes Program [phone] (team pager from 8a-5p)

## 2020-05-13 NOTE — Progress Notes (Signed)
PROGRESS NOTE  Caitlin Harrington  DOB: Sep 06, 1946  PCP: Vernie Shanks, MD [facility]:[id number]  DOA: 05/12/2020  LOS: 1 day   Chief Complaint  Patient presents with  . Code Stroke   Brief narrative: Caitlin Harrington is a 74 y.o. female with PMH significant for IDDM, hypertension, gout, asthma mild intermittent, COVID-19 infection in February 2021. Patient presented to the ED on 6/20 with confusion, urinary frequency and lower abdominal pain for one day.  Patient was last seen normal on 6/19 morning. Through the day patient developed urinary frequency with suprapubic abdominal pain, cramping like.  Throughout the day patient developed nausea and frequent vomiting.  No fever or chills.   On the morning of admission, patient was found by fiance on toilet vomiting and confused. There was also a concern about the patient having facial droop and trouble finding words at the same time.  Fianc called EMS and was subsequently brought to the ED as a stroke alert.  In the ED, patient was afebrile, tachycardic, blood pressure 144/90, breathing on room air  Patient was very confused and had left-sided neglect. Labs showed potassium elevated to 5.3, glucose elevated to 461, lactic acid 2.5, white count 18.6, hemoglobin 18, Hemoglobin A1c 13.2 Urinalysis showed cloudy yellow urine with more than 500 glucose, large leukocytes. Urine drug screen negative.  CT scan of head showed remote strokes, no acute intracranial finding. MRI brain negative for acute finding as well. CT angio of head and neck and CT perfusion study did not show any emergent large vessel occlusion, there is moderate right supraclinoid ICA narrowing, mild proximal left M1 segment narrowing, moderate right A1 segment narrowing, scattered segmental narrowing within branch vessels bilaterally as described, moderate tortuosity of the cervical internal carotid arteries bilaterally, likely secondary to hypertension.  Patient was admitted under  hospitalist service for further evaluation management.  Subjective: Patient was seen and examined this morning.  Pleasant elderly Caucasian female lying down in bed.  Alert, awake, oriented x3.  Able to follow commands.  Did not see any evidence of left hemineglect as it was seen in the ED. Chart reviewed. No fever, heart rate in 80s, blood pressure widely fluctuating between 110/78-200/108. Breathing on room air. Repeat labs this morning with potassium level low at 3.2, WBC count remains elevated at 19.6, lactic acid was not repeated this morning. A1c 13.2  Vitals:   05/13/20 0157 05/13/20 0232 05/13/20 0436 05/13/20 0723  BP: 110/78 (!) 200/108 138/72 (!) 181/89  Pulse: 88 87 83 73  Resp: 16 18 18 18   Temp: 99.1 F (37.3 C) 97.7 F (36.5 C) 97.9 F (36.6 C) 98 F (36.7 C)  TempSrc: Oral Oral Oral Oral  SpO2: 99% 98% 97% 96%  Weight:      Height:       Assessment/Plan: Acute metabolic encephalopathy -Likely from UTI, antibiotic started in ED -urine culture was not sent on admission. Add to yesterday's sample. -No fever.  WBC count elevated.  Continue to monitor.  Left hemineglect -Initial symptoms were strongly suspicious of stroke clinically.  However imaging did not show any acute abnormality.  This morning, patient does not have any neurological deficit. -Neurology following. -Continue Pravastatin 20 mg daily.  IDDM with hyperglycemia -A1c 13.2 -Blood sugar level on presentation was elevated to more than 400 -Home meds include Levemir 16 units twice daily, Tradjenta 5 mg daily.  But apparently patient has not been taking insulin for quite some time. -She has been started again on Lantus  15 units along with sliding scale insulin.  Continue the same. Resume Tradjenta..  Hypertensive urgency -I did not see any blood pressure medicine in her home list. -Overnight, blood pressure widely fluctuating between 110/78-200/108 -I will start her on Coreg 3.125 mg twice daily this  morning.  Gout -Continue allopurinol  Abnormal CT abdomen finding -Unknown clinical significance but CT abdomen showed a small amount of excreted contrast extending from the right renal pelvis along the course of the right renal vein possibly reflecting sequela of ruptured right renal calyx. No significant perinephric fluid collection or urinoma. No nephrolithiasis or evidence of acute obstructive uropathy. Findings are of uncertain clinical significance.      Mobility: PT eval Code Status:   Code Status: Full Code  DVT prophylaxis: enoxaparin (LOVENOX) injection 40 mg Start: 05/12/20 1330  Antimicrobials:  IV Rocephin Fluid: Normal saline at 100 mL/h  Diet:  Diet Order            Diet heart healthy/carb modified Room service appropriate? Yes; Fluid consistency: Thin  Diet effective now                 Consultants: Neurology Family Communication:  None at bedside   Status is: Inpatient  Remains inpatient appropriate because ongoing diagnostic evaluation   Dispo: The patient is from: Home              Anticipated d/c is to: Home              Anticipated d/c date is: 2 days              Patient currently is not medically stable to d/c.        Infusions:  . sodium chloride 100 mL/hr at 05/12/20 1920  . cefTRIAXone (ROCEPHIN)  IV      Scheduled Meds: . allopurinol  300 mg Oral Daily  . enoxaparin (LOVENOX) injection  40 mg Subcutaneous Q24H  . insulin aspart  0-5 Units Subcutaneous QHS  . insulin aspart  0-9 Units Subcutaneous TID WC  . insulin aspart  5 Units Subcutaneous TID WC  . insulin glargine  15 Units Subcutaneous Daily  . linagliptin  5 mg Oral Daily  . pravastatin  20 mg Oral Daily    Antimicrobials: Anti-infectives (From admission, onward)   Start     Dose/Rate Route Frequency Ordered Stop   05/13/20 1100  cefTRIAXone (ROCEPHIN) 1 g in sodium chloride 0.9 % 100 mL IVPB     Discontinue     1 g 200 mL/hr over 30 Minutes Intravenous Every 24  hours 05/12/20 1316     05/12/20 1045  cefTRIAXone (ROCEPHIN) 1 g in sodium chloride 0.9 % 100 mL IVPB        1 g 200 mL/hr over 30 Minutes Intravenous  Once 05/12/20 1034 05/12/20 1203      PRN meds: acetaminophen **OR** acetaminophen, albuterol, hydrALAZINE, iohexol, polyethylene glycol   Objective: Vitals:   05/13/20 0436 05/13/20 0723  BP: 138/72 (!) 181/89  Pulse: 83 73  Resp: 18 18  Temp: 97.9 F (36.6 C) 98 F (36.7 C)  SpO2: 97% 96%    Intake/Output Summary (Last 24 hours) at 05/13/2020 0841 Last data filed at 05/12/2020 1925 Gross per 24 hour  Intake 1100 ml  Output 550 ml  Net 550 ml   Filed Weights   05/12/20 0911  Weight: 63.5 kg   Weight change:  Body mass index is 27.34 kg/m.   Physical Exam: General exam:  Appears calm and comfortable.  Skin: No rashes, lesions or ulcers. HEENT: Atraumatic, normocephalic, supple neck, no obvious bleeding Lungs: Clear to auscultate bilaterally CVS: Regular rate and rhythm, no murmur GI/Abd soft, nontender, nondistended, bowel sound present CNS: Alert, awake amount of x3, able to follow motor commands.  I do not see evidence of left hemineglect. Psychiatry: Mood appropriate Extremities: No pedal edema, no calf tenderness  Data Review: I have personally reviewed the laboratory data and studies available.  Recent Labs  Lab 05/12/20 0735 05/12/20 0756 05/13/20 0430  WBC 18.6*  --  19.6*  NEUTROABS 16.4*  --   --   HGB 17.4* 18.0* 14.0  HCT 52.2* 53.0* 41.9  MCV 85.4  --  85.2  PLT 262  --  258   Recent Labs  Lab 05/12/20 0735 05/12/20 0756 05/13/20 0430  NA 136 136 138  K 5.3* 5.3* 3.2*  CL 98 101 103  CO2 25  --  23  GLUCOSE 440* 461* 229*  BUN 19 24* 18  CREATININE 0.97 0.80 1.02*  CALCIUM 10.0  --  8.5*   Lipid Panel     Component Value Date/Time   CHOL 219 (H) 09/18/2013 1040   TRIG 45 12/30/2019 1715   HDL 50 09/18/2013 1040   CHOLHDL 4.4 09/18/2013 1040   LDLCALC 142 (H) 09/18/2013 1040     LABVLDL 27 09/18/2013 1040    Recent Labs    05/12/20 0735  HGBA1C 13.2*    Signed, Lorin Glass, MD [facility] Pager: [phone] (Secure Chat preferred). 05/13/2020

## 2020-05-14 LAB — GLUCOSE, CAPILLARY
Glucose-Capillary: 134 mg/dL — ABNORMAL HIGH (ref 70–99)
Glucose-Capillary: 179 mg/dL — ABNORMAL HIGH (ref 70–99)
Glucose-Capillary: 127 mg/dL — ABNORMAL HIGH (ref 70–99)
Glucose-Capillary: 233 mg/dL — ABNORMAL HIGH (ref 70–99)

## 2020-05-14 LAB — CBC WITH DIFFERENTIAL/PLATELET
Abs Immature Granulocytes: 0.05 10*3/uL (ref 0.00–0.07)
Basophils Absolute: 0.1 10*3/uL (ref 0.0–0.1)
Basophils Relative: 1 %
Eosinophils Absolute: 1.2 10*3/uL — ABNORMAL HIGH (ref 0.0–0.5)
Eosinophils Relative: 9 %
HCT: 45.2 % (ref 36.0–46.0)
Hemoglobin: 15.2 g/dL — ABNORMAL HIGH (ref 12.0–15.0)
Immature Granulocytes: 0 %
Lymphocytes Relative: 23 %
Lymphs Abs: 3 10*3/uL (ref 0.7–4.0)
MCH: 28.4 pg (ref 26.0–34.0)
MCHC: 33.6 g/dL (ref 30.0–36.0)
MCV: 84.3 fL (ref 80.0–100.0)
Monocytes Absolute: 0.8 10*3/uL (ref 0.1–1.0)
Monocytes Relative: 6 %
Neutro Abs: 8 10*3/uL — ABNORMAL HIGH (ref 1.7–7.7)
Neutrophils Relative %: 61 %
Platelets: 230 10*3/uL (ref 150–400)
RBC: 5.36 MIL/uL — ABNORMAL HIGH (ref 3.87–5.11)
RDW: 13 % (ref 11.5–15.5)
WBC: 13.2 10*3/uL — ABNORMAL HIGH (ref 4.0–10.5)
nRBC: 0 % (ref 0.0–0.2)

## 2020-05-14 LAB — BASIC METABOLIC PANEL
Anion gap: 13 (ref 5–15)
BUN: 17 mg/dL (ref 8–23)
CO2: 21 mmol/L — ABNORMAL LOW (ref 22–32)
Calcium: 8.8 mg/dL — ABNORMAL LOW (ref 8.9–10.3)
Chloride: 105 mmol/L (ref 98–111)
Creatinine, Ser: 0.7 mg/dL (ref 0.44–1.00)
GFR calc Af Amer: >60 mL/min (ref >60)
GFR calc non Af Amer: >60 mL/min (ref >60)
Glucose, Bld: 162 mg/dL — ABNORMAL HIGH (ref 70–99)
Potassium: 3.1 mmol/L — ABNORMAL LOW (ref 3.5–5.1)
Sodium: 139 mmol/L (ref 135–145)

## 2020-05-14 LAB — MAGNESIUM: Magnesium: 1.8 mg/dL (ref 1.7–2.4)

## 2020-05-14 LAB — PHOSPHORUS: Phosphorus: 2.1 mg/dL — ABNORMAL LOW (ref 2.5–4.6)

## 2020-05-14 MED ORDER — LISINOPRIL 5 MG PO TABS
5.0000 mg | ORAL_TABLET | Freq: Every day | ORAL | Status: DC
  Administered 2020-05-14 – 2020-05-15 (×2): 5 mg via ORAL
  Filled 2020-05-14 (×2): qty 1

## 2020-05-14 MED ORDER — IBUPROFEN 200 MG PO TABS: 400.0000 mg | ORAL_TABLET | Freq: Four times a day (QID) | ORAL | Status: DC | PRN

## 2020-05-14 MED ORDER — ACETAMINOPHEN 325 MG PO TABS: 650.0000 mg | ORAL_TABLET | Freq: Four times a day (QID) | ORAL | Status: DC | PRN

## 2020-05-14 MED ORDER — POTASSIUM CHLORIDE CRYS ER 20 MEQ PO TBCR
40.0000 meq | EXTENDED_RELEASE_TABLET | Freq: Once | ORAL | Status: AC
  Administered 2020-05-14: 40 meq via ORAL
  Filled 2020-05-14: qty 2

## 2020-05-14 NOTE — Progress Notes (Signed)
PROGRESS NOTE  Caitlin Harrington  DOB: 1945/11/30  PCP: Vernie Shanks, MD XTK:[id number]  DOA: 05/12/2020  LOS: 2 days   Chief Complaint  Patient presents with   Code Stroke   Brief narrative: Caitlin Harrington is a 74 y.o. female with PMH significant for IDDM, hypertension, gout, asthma mild intermittent, COVID-19 infection in February 2021. Patient presented to the ED on 6/20 with confusion, urinary frequency and lower abdominal pain for one day.  Patient was last seen normal on 6/19 morning. Through the day patient developed urinary frequency with suprapubic abdominal pain, cramping like.  Throughout the day patient developed nausea and frequent vomiting.  No fever or chills.   On the morning of admission, patient was found by fiance on toilet vomiting and confused. There was also a concern about the patient having facial droop and trouble finding words at the same time.  Fianc called EMS and was subsequently brought to the ED as a stroke alert.  In the ED, patient was afebrile, tachycardic, blood pressure 144/90, breathing on room air  Patient was very confused and had left-sided neglect. Labs showed potassium elevated to 5.3, glucose elevated to 461, lactic acid 2.5, white count 18.6, hemoglobin 18, Hemoglobin A1c 13.2 Urinalysis showed cloudy yellow urine with more than 500 glucose, large leukocytes. Urine drug screen negative.  CT scan of head showed remote strokes, no acute intracranial finding. MRI brain negative for acute finding as well. CT angio of head and neck and CT perfusion study did not show any emergent large vessel occlusion, there is moderate right supraclinoid ICA narrowing, mild proximal left M1 segment narrowing, moderate right A1 segment narrowing, scattered segmental narrowing within branch vessels bilaterally as described, moderate tortuosity of the cervical internal carotid arteries bilaterally, likely secondary to hypertension.  Patient was admitted under  hospitalist service for further evaluation management.  Subjective: Patient was seen and examined this morning.  Complains of a headache from earlier this morning.  Says she takes Advil at home, but her allergy list says she gets hives to ibuprofen. Blood pressure still remains elevated to 140s and 150s.  WBC count improved. Labs this morning show low potassium.  Urine culture report pending.  Assessment/Plan: Acute metabolic encephalopathy -Likely from UTI, IV Rocephin started in ED -urine culture pending. -No fever.  WBC count was elevated.  Improved today. -Continue Rocephin.  Left hemineglect -Initial symptoms were strongly suspicious of stroke clinically.  However imaging did not show any acute abnormality.  This morning, patient does not have any neurological deficit. -Neurology following. -Continue Pravastatin 20 mg daily.  IDDM with hyperglycemia -A1c 13.2 -Blood sugar level on presentation was elevated to more than 400 -Home meds include Levemir 16 units twice daily, Tradjenta 5 mg daily.  But apparently patient was not taking insulin for quite some time. -She has been started again on Lantus 15 units along with sliding scale insulin.  Continue the same regimen of insulin Tradjenta. -Diabetes education coordinator consulted. -At discharge, will provide her with glucometer, pen needles.  Hypertensive urgency -Blood pressure on admission was elevated to 200.   -Not on blood pressure medications at home.   -Started on Coreg 3.15 mg twice daily yesterday. -Blood pressure remains elevated to 150s and 160s.  I added lisinopril 5 mg daily today.  Continue to monitor blood pressure.  Headache -Tylenol  Gout -Continue allopurinol  Abnormal CT abdomen finding -Unknown clinical significance but CT abdomen showed a small amount of excreted contrast extending from the right  renal pelvis along the course of the right renal vein possibly reflecting sequela of ruptured right renal  calyx. No significant perinephric fluid collection or urinoma. No nephrolithiasis or evidence of acute obstructive uropathy. Findings are of uncertain clinical significance.   -Patient does not have any symptoms of flank pain, fever, burning urination, dysuria.       Mobility: PT eval Code Status:   Code Status: Full Code  DVT prophylaxis: enoxaparin (LOVENOX) injection 40 mg Start: 05/12/20 1330  Antimicrobials:  IV Rocephin Fluid: Currently not on IV fluid.  Diet:  Diet Order            Diet heart healthy/carb modified Room service appropriate? Yes; Fluid consistency: Thin  Diet effective now                 Consultants: Neurology Family Communication:  None at bedside   Status is: Inpatient  Remains inpatient appropriate because ongoing diagnostic evaluation   Dispo: The patient is from: Home              Anticipated d/c is to: Home              Anticipated d/c date is: Tomorrow              Patient currently is not medically stable to d/c.   Infusions:   cefTRIAXone (ROCEPHIN)  IV 1 g (05/14/20 1040)    Scheduled Meds:  allopurinol  300 mg Oral Daily   carvedilol  3.125 mg Oral BID WC   enoxaparin (LOVENOX) injection  40 mg Subcutaneous Q24H   insulin aspart  0-5 Units Subcutaneous QHS   insulin aspart  0-9 Units Subcutaneous TID WC   insulin glargine  15 Units Subcutaneous BID   insulin starter kit- pen needles  1 kit Other Once   linagliptin  5 mg Oral Daily   lisinopril  5 mg Oral Daily   living well with diabetes book   Does not apply Once   potassium chloride  40 mEq Oral Once   pravastatin  20 mg Oral Daily    Antimicrobials: Anti-infectives (From admission, onward)   Start     Dose/Rate Route Frequency Ordered Stop   05/13/20 1100  cefTRIAXone (ROCEPHIN) 1 g in sodium chloride 0.9 % 100 mL IVPB     Discontinue     1 g 200 mL/hr over 30 Minutes Intravenous Every 24 hours 05/12/20 1316     05/12/20 1045  cefTRIAXone (ROCEPHIN) 1 g in  sodium chloride 0.9 % 100 mL IVPB        1 g 200 mL/hr over 30 Minutes Intravenous  Once 05/12/20 1034 05/12/20 1203      PRN meds: acetaminophen **OR** acetaminophen, acetaminophen, albuterol, hydrALAZINE, iohexol, polyethylene glycol   Objective: Vitals:   05/14/20 0418 05/14/20 0800  BP: (!) 172/98 (!) 146/78  Pulse: 70   Resp: 16 20  Temp: 97.6 F (36.4 C) 97.6 F (36.4 C)  SpO2: 98% 97%    Intake/Output Summary (Last 24 hours) at 05/14/2020 1104 Last data filed at 05/13/2020 1620 Gross per 24 hour  Intake 100 ml  Output --  Net 100 ml   Filed Weights   05/12/20 0911  Weight: 63.5 kg   Weight change:  Body mass index is 27.34 kg/m.   Physical Exam: General exam: Appears calm and comfortable.  Mild headache today Skin: No rashes, lesions or ulcers. HEENT: Atraumatic, normocephalic, supple neck, no obvious bleeding Lungs: Clear to auscultate bilaterally. CVS:  Regular rate and rhythm, no murmur GI/Abd soft, nontender, nondistended, bowel sound present CNS: Alert, awake amount of x3, able to follow motor commands.  No new neurological findings  psychiatry: Mood appropriate Extremities: No pedal edema, no calf tenderness  Data Review: I have personally reviewed the laboratory data and studies available.  Recent Labs  Lab 05/12/20 0735 05/12/20 0756 05/13/20 0430 05/14/20 0828  WBC 18.6*  --  19.6* 13.2*  NEUTROABS 16.4*  --   --  8.0*  HGB 17.4* 18.0* 14.0 15.2*  HCT 52.2* 53.0* 41.9 45.2  MCV 85.4  --  85.2 84.3  PLT 262  --  258 230   Recent Labs  Lab 05/12/20 0735 05/12/20 0756 05/13/20 0430 05/14/20 0828  NA 136 136 138 139  K 5.3* 5.3* 3.2* 3.1*  CL 98 101 103 105  CO2 25  --  23 21*  GLUCOSE 440* 461* 229* 162*  BUN 19 24* 18 17  CREATININE 0.97 0.80 1.02* 0.70  CALCIUM 10.0  --  8.5* 8.8*  MG  --   --   --  1.8  PHOS  --   --   --  2.1*   Lipid Panel     Component Value Date/Time   CHOL 219 (H) 09/18/2013 1040   TRIG 45 12/30/2019  1715   HDL 50 09/18/2013 1040   CHOLHDL 4.4 09/18/2013 1040   LDLCALC 142 (H) 09/18/2013 1040   LABVLDL 27 09/18/2013 1040    Recent Labs    05/12/20 0735  HGBA1C 13.2*    Signed, [name], MD [facility] Pager: [phone] (Secure Chat preferred). 05/14/2020

## 2020-05-14 NOTE — Plan of Care (Signed)
  RD consulted for nutrition education regarding diabetes.   Lab Results  Component Value Date   HGBA1C 13.2 (H) 05/12/2020    RD provided "Carbohydrate Counting for People with Diabetes" handout from the Academy of [employer] and Dietetics. Discussed different food groups and their effects on blood sugar, emphasizing carbohydrate-containing foods. Provided list of carbohydrates and recommended serving sizes of common foods. Reviewed pt's dietary recall and provided examples of ways to balance the options she already incorporates regularly.   Discussed importance of controlled and consistent carbohydrate intake throughout the day. Provided examples of ways to balance meals/snacks and encouraged intake of high-fiber, whole grain complex carbohydrates. Teach back method used.  Expect fair compliance.  Body mass index is 27.34 kg/m. Pt meets criteria for overweight based on current BMI.  Current diet order is Heart Healthy/Carb Modified. No PO intake documented at this time, though pt reports appetite is good.  Labs: K+ 3.1 (L), Phosphorus 2.1 (L) Medications: Novolog, Lantus, Tradjenta  No further nutrition interventions warranted at this time. RD contact information provided. If additional nutrition issues arise, please re-consult RD.   Eugene Gavia, MS, RD, LDN RD pager number and weekend/on-call pager number located in [place].

## 2020-05-14 NOTE — Progress Notes (Addendum)
Inpatient Diabetes Program Recommendations  AACE/ADA: New Consensus Statement on Inpatient Glycemic Control (2015)  Target Ranges:  Prepandial:   less than 140 mg/dL      Peak postprandial:   less than 180 mg/dL (1-2 hours)      Critically ill patients:  140 - 180 mg/dL   Lab Results  Component Value Date   GLUCAP 179 (H) 05/14/2020   HGBA1C 13.2 (H) 05/12/2020     Note: Received verbal order from Dr. Bonner Puna place freestyle [employer].   Will still check CBG's inpatient with finger stick.  Patient has used a Libre sensor in the past and really liked it.  Her insurance does cover the sensor.  Sensor was placed on back of left arm.  Educated patient on sensor and explained that if sensor reads less than 70 or >250 mg/dl to check with finger stick.  Patient very grateful.  Notified Sonja, RN of sensor in place.  Staff has been allowing patient to self administer insulin.  Reviewed Insulin pen administration again and hypoglycemia, symptoms and treatment.  Patient admits to only eating one meal a day at home because she sleeps most of the day.  She will most likely start feeling better with lower blood sugars.  If patient has consistent CBG's < 100 or > 200 mg/dl call MD. At DC will likely benefit from Lantus 15 units BID Order # [id number] Trajenta 5 mg daily.       Will continue to follow while inpatient.  Thank you, Dulce Sellar, RN, BSN Diabetes Coordinator Inpatient Diabetes Program [phone] (team pager from 8a-5p)

## 2020-05-15 LAB — URINE CULTURE: Culture: >=100000 — AB

## 2020-05-15 LAB — GLUCOSE, CAPILLARY
Glucose-Capillary: 114 mg/dL — ABNORMAL HIGH (ref 70–99)
Glucose-Capillary: 201 mg/dL — ABNORMAL HIGH (ref 70–99)
Glucose-Capillary: 213 mg/dL — ABNORMAL HIGH (ref 70–99)

## 2020-05-15 MED ORDER — INSULIN STARTER KIT- PEN NEEDLES (ENGLISH): 1.0000 | Freq: Once | 0 refills | Status: AC

## 2020-05-15 MED ORDER — CARVEDILOL 3.125 MG PO TABS: 3.1250 mg | ORAL_TABLET | Freq: Two times a day (BID) | ORAL | 0 refills | Status: DC

## 2020-05-15 MED ORDER — LISINOPRIL 10 MG PO TABS: 10.0000 mg | ORAL_TABLET | Freq: Every day | ORAL | 0 refills | Status: DC

## 2020-05-15 MED ORDER — LINAGLIPTIN 5 MG PO TABS: 5.0000 mg | ORAL_TABLET | Freq: Every day | ORAL | 0 refills | Status: DC

## 2020-05-15 MED ORDER — INSULIN GLARGINE 100 UNIT/ML SOLOSTAR PEN
15.0000 [IU] | PEN_INJECTOR | Freq: Two times a day (BID) | SUBCUTANEOUS | 0 refills | Status: DC
Start: 2020-05-15 — End: 2020-10-30

## 2020-05-15 MED ORDER — AMOXICILLIN-POT CLAVULANATE 875-125 MG PO TABS
1.0000 | ORAL_TABLET | Freq: Two times a day (BID) | ORAL | Status: DC
  Administered 2020-05-15: 1 via ORAL
  Filled 2020-05-15: qty 1

## 2020-05-15 MED ORDER — AMOXICILLIN-POT CLAVULANATE 875-125 MG PO TABS: 1.0000 | ORAL_TABLET | Freq: Two times a day (BID) | ORAL | 0 refills | Status: AC

## 2020-05-15 MED ORDER — BLOOD GLUCOSE MONITOR KIT: PACK | 0 refills | Status: DC

## 2020-05-15 MED ORDER — SACCHAROMYCES BOULARDII 250 MG PO CAPS: 250.0000 mg | ORAL_CAPSULE | Freq: Two times a day (BID) | ORAL | 0 refills | Status: AC

## 2020-05-15 NOTE — Progress Notes (Signed)
Discharged to home after extensive diabetes education with pt and son [name].  All questions answered.

## 2020-05-15 NOTE — Plan of Care (Signed)

## 2020-05-15 NOTE — Progress Notes (Signed)
Inpatient Diabetes Program Recommendations  AACE/ADA: New Consensus Statement on Inpatient Glycemic Control (2015)  Target Ranges:  Prepandial:   less than 140 mg/dL      Peak postprandial:   less than 180 mg/dL (1-2 hours)      Critically ill patients:  140 - 180 mg/dL   Lab Results  Component Value Date   GLUCAP 201 (H) 05/15/2020   HGBA1C 13.2 (H) 05/12/2020    Review of Glycemic Control  Results for Caitlin Harrington, Caitlin Harrington (MRN [id number]) as of 05/15/2020 14:48  Ref. Range 05/14/2020 12:00 05/14/2020 17:30 05/14/2020 21:16 05/15/2020 06:01 05/15/2020 11:24  Glucose-Capillary Latest Ref Range: 70 - 99 mg/dL 179 (H) 127 (H) 233 (H) 114 (H) 201 (H)    Note:  Met with patient at bedside prior to discharge to review education on insulin pen administration, hypoglycemic and CHO's.  Patient was able to demonstrate insulin pen administration.  Went over insulin pen starter kit step by step instructions and highlighted steps.  Told patient to call Dr. [name], PCP if she has consistent blood sugar readings <100 or >200 mg/dl. Attached education on DM to AVS.  Asked Kelli, TOC to please schedule patient for follow up with PCP for in 1 week as she is new to insulin.  Kelli notified this RN that she will also have [facility] nursing which she will benefit from.  She has been using her Libre sensor and is very happy with it.  All questions answered.    Will continue to follow while inpatient.  Thank you,  Reche Dixon, RN, BSN Diabetes Coordinator Inpatient Diabetes Program [phone] (team pager from 8a-5p)

## 2020-05-15 NOTE — Care Management Important Message (Signed)
Important Message  Patient Details  Name: Caitlin Harrington MRN: [id number] Date of Birth: 11/17/1946   Medicare Important Message Given:  Yes     Dorena Bodo 05/15/2020, 3:44 PM

## 2020-05-15 NOTE — Discharge Summary (Signed)
Physician Discharge Summary  DILCIA RYBARCZYK MHD:[id number] DOB: September 27, 1946 DOA: 05/12/2020  PCP: Vernie Shanks, MD  Admit date: 05/12/2020 Discharge date: 05/15/2020  Admitted From: Home Discharge disposition: Home with home health RN   Code Status: Full Code  Diet Recommendation: Cardiac/diabetic diet  Recommendations for Outpatient Follow-Up:   1. Follow-up with PCP as an outpatient  Discharge Diagnosis:   Active Problems:   Urinary tract infection without hematuria   Encephalopathy   History of Present Illness / Brief narrative:  Caitlin Harrington a 74 y.o.femalewith PMH significant forIDDM, hypertension, gout,asthmamild intermittent, COVID-19 infection in February 2021. Patient presented to the ED on 6/20 with confusion, urinary frequency and lower abdominal painfor one day.Patient was last seen normal on 6/19 morning. Through the day patient developed urinary frequency with suprapubic abdominal pain, cramping like. Throughout the day patient developed nausea and frequent vomiting. No fever or chills.  On the morning of admission, patient was found by fiance ontoilet vomiting and confused. There was also a concern about the patient having facial droop and trouble finding words at the same time. Fianc called EMS and was subsequently brought to the ED as a stroke alert.  In the ED, patient was afebrile, tachycardic, blood pressure 144/90, breathing on room air  Patient was very confused and had left-sided neglect. Labs showed potassium elevated to 5.3, glucose elevated to 461, lactic acid 2.5, white count 18.6, hemoglobin 18, Hemoglobin A1c 13.2 Urinalysis showed cloudy yellow urine with more than 500 glucose, large leukocytes. Urine drug screen negative.  CT scan of head showed remote strokes, no acute intracranial finding. MRI brain negative for acute finding as well. CT angio of head and neck and CT perfusion study did not show any emergent large vessel  occlusion, there is moderate right supraclinoid ICA narrowing, mild proximal left M1 segment narrowing, moderate right A1 segment narrowing, scattered segmental narrowing within branch vessels bilaterally as described, moderate tortuosity of the cervical internal carotid arteries bilaterally, likely secondary to hypertension.  Patient was admitted under hospitalist service for further evaluation management.   Hospital Course:  Acute metabolic encephalopathy Streptococcus agalactiae UTI -Patient presented with altered mental status. Urinalysis was positive. Started on IV Rocephin on admission. Urine culture showed Streptococcus agalactiae. -Switch to oral Augmentin at discharge for next 5 days with probiotics.  Left hemineglect -Initial symptoms were strongly suspicious of stroke clinically.  However imaging did not show any acute abnormality.  This morning, patient does not have any neurological deficit. -Neurology following. -Continue Pravastatin 20 mg daily.  IDDMwith hyperglycemia -A1c 13.2 -Blood sugar level on presentation was elevated to more than 400 -Home meds include Levemir 16 units twice daily, Tradjenta 5 mg daily.  But apparently patient was not taking insulin for quite some time. -Her medications have been resumed. -Diabetes education coordinator consulted. -A freestyle libre sensor as an placed and patient will continue to use it at home. -At discharge, also prescription for Lantus, glucometer and pen needles  Hypertensive urgency -Blood pressure on admission was elevated to 200.   -Not on blood pressure medications at home.   -Medications as started.  Discharged on Coreg 3.125 mg twice daily and lisinopril 10 mg daily. Continue to monitor blood pressure at home.  Headache -Tylenol  Gout -Continue allopurinol  Abnormal CT abdomen finding -Unknown clinical significance but CT abdomen showed a small amount of excreted contrast extending from the right renal  pelvis along the course of the right renal vein possibly reflecting sequela of ruptured  right renal calyx. No significant perinephric fluid collection or urinoma. No nephrolithiasis or evidence of acute obstructive uropathy. Findings are of uncertain clinical significance.   -Patient does not have any symptoms of flank pain, fever, burning urination, dysuria.     Mobility: PT eval Code Status:  Code Status: Full Code  Stable to discharge to home with home health RN.  Subjective:  Seen and examined this morning.  Not in distress.  Ready to go home  Discharge Exam:   Vitals:   05/14/20 2312 05/15/20 0342 05/15/20 0735 05/15/20 0800  BP: 133/82 (!) 141/95 (!) 151/79 (!) 146/70  Pulse: 67 65 64   Resp: 17 16 18    Temp: 98.5 F (36.9 C) 97.7 F (36.5 C) 98.5 F (36.9 C)   TempSrc: Oral Oral Oral   SpO2: 96% 100% 99%   Weight:      Height:        Body mass index is 27.34 kg/m.  General exam: Appears calm and comfortable.  Not in distress Skin: No rashes, lesions or ulcers. HEENT: Atraumatic, normocephalic, supple neck, no obvious bleeding Lungs: Clear to auscultate bilaterally CVS: Regular rate and rhythm, no murmur GI/Abd soft, nontender, nondistended, bowel sound present CNS: Alert, awake, oriented x3 Psychiatry: Mood appropriate Extremities: No pedal edema, no calf tenderness  Discharge Instructions:  Wound care: None     Discharge Instructions    Diet - low sodium heart healthy   Complete by: As directed    Diet Carb Modified   Complete by: As directed    Increase activity slowly   Complete by: As directed       Follow-up Information    [facility].   Contact information: [place] [address] [phone]             Allergies as of 05/15/2020      Reactions   Motrin [ibuprofen] Hives   Atorvastatin Hives, Rash   chills   Meloxicam Hives, Rash   Statins Hives, Rash        Medication List    STOP taking these medications   insulin detemir 100 UNIT/ML injection Commonly known as: LEVEMIR   polyethylene glycol 17 g packet Commonly known as: MIRALAX / GLYCOLAX     TAKE these medications   albuterol 108 (90 Base) MCG/ACT inhaler Commonly known as: Proventil HFA Inhale 2 puffs into the lungs every 6 (six) hours as needed.   allopurinol 300 MG tablet Commonly known as: ZYLOPRIM Take 1 tablet (300 mg total) by mouth daily.   amoxicillin-clavulanate 875-125 MG tablet Commonly known as: AUGMENTIN Take 1 tablet by mouth every 12 (twelve) hours for 5 days.   blood glucose meter kit and supplies Kit Dispense based on patient and insurance preference. Use up to four times daily as directed. (FOR ICD-9 250.00, 250.01).   carvedilol 3.125 MG tablet Commonly known as: COREG Take 1 tablet (3.125 mg total) by mouth 2 (two) times daily with a meal.   econazole nitrate 1 % cream Apply 1 application topically 2 (two) times daily.   insulin glargine 100 UNIT/ML Solostar Pen Commonly known as: LANTUS Inject 15 Units into the skin 2 (two) times daily.   insulin starter kit- pen needles Misc 1 kit by Other route once for 1 dose.   linagliptin 5 MG Tabs tablet Commonly known as: TRADJENTA Take 1 tablet (5 mg total) by mouth daily.   lisinopril 10 MG tablet Commonly  known as: ZESTRIL Take 1 tablet (10 mg total) by mouth daily. Start taking on: May 16, 2020   pravastatin 10 MG tablet Commonly known as: PRAVACHOL Take 2 tablets (20 mg total) by mouth daily.   saccharomyces boulardii 250 MG capsule Commonly known as: FLORASTOR Take 1 capsule (250 mg total) by mouth 2 (two) times daily for 5 days.       Time coordinating discharge: 35 minutes  The results of significant diagnostics from this hospitalization (including imaging, microbiology, ancillary and laboratory) are listed below for reference.    Procedures and Diagnostic Studies:   CT ABDOMEN  PELVIS WO CONTRAST  Result Date: 05/12/2020 CLINICAL DATA:  Right lower quadrant abdominal pain EXAM: CT ABDOMEN AND PELVIS WITHOUT CONTRAST TECHNIQUE: Multidetector CT imaging of the abdomen and pelvis was performed following the standard protocol without IV contrast. COMPARISON:  None. FINDINGS: Lower chest: No acute abnormality. Hepatobiliary: Diffusely decreased attenuation of the hepatic parenchyma suggesting hepatic steatosis. No focal hepatic lesion identified. Enhancement of the wall of the gallbladder is likely related to recent contrast study. No hyperdense gallstone. No pericholecystic inflammatory changes. No biliary dilatation. Pancreas: Mild pancreatic atrophy. No peripancreatic inflammatory changes or ductal dilatation. Spleen: Normal in size without focal abnormality. Adrenals/Urinary Tract: Unremarkable adrenal glands. Excreted contrast within the bilateral renal collecting systems including within the ureters and urinary bladder. There is a small amount of excreted contrast extending from the right renal pelvis along the course of the right renal vein (series 3, image 32; series 6, image 85) possibly reflecting sequela of ruptured right renal calyx. No significant perinephric fluid collection or urinoma. Urinary bladder is moderately distended. No filling defect or other abnormality. No focal renal lesion. Stomach/Bowel: Small hiatal hernia. Stomach appears otherwise within normal limits. Small bowel is unremarkable. No dilated loops of bowel. A normal appendix is present in the right lower quadrant (series 6, image 77). No focal bowel wall thickening. No pericolonic inflammatory changes. Vascular/Lymphatic: Scattered aortoiliac atherosclerotic calcifications without aneurysm. No abdominopelvic lymphadenopathy. Reproductive: Status post hysterectomy. No adnexal masses. Other: Small fat containing umbilical hernia. No free fluid or free air. Musculoskeletal: No acute or significant osseous  findings. IMPRESSION: 1. No acute abdominopelvic findings. Normal appendix. 2. There is a small amount of excreted contrast extending from the right renal pelvis along the course of the right renal vein possibly reflecting sequela of ruptured right renal calyx. No significant perinephric fluid collection or urinoma. No nephrolithiasis or evidence of acute obstructive uropathy. Findings are of uncertain clinical significance. 3. Hepatic steatosis. 4. Small hiatal hernia. 5. Aortic atherosclerosis. (ICD10-I70.0). Electronically Signed   By: Davina Poke D.O.   On: 05/12/2020 11:30   CT Code Stroke CTA Head W/WO contrast  Result Date: 05/12/2020 CLINICAL DATA:  New onset confusion. Word naming difficulties. Left sided drift. EXAM: CT ANGIOGRAPHY HEAD AND NECK CT PERFUSION BRAIN TECHNIQUE: Multidetector CT imaging of the head and neck was performed using the standard protocol during bolus administration of intravenous contrast. Multiplanar CT image reconstructions and MIPs were obtained to evaluate the vascular anatomy. Carotid stenosis measurements (when applicable) are obtained utilizing NASCET criteria, using the distal internal carotid diameter as the denominator. Multiphase CT imaging of the brain was performed following IV bolus contrast injection. Subsequent parametric perfusion maps were calculated using RAPID software. CONTRAST:  157m OMNIPAQUE IOHEXOL 350 MG/ML SOLN COMPARISON:  None. FINDINGS: CTA NECK FINDINGS Aortic arch: Atherosclerotic changes are present in the distal arch. Great vessel origins are within normal limits. Three vessel  configuration is present. Right carotid system: Right common carotid artery is within normal limits. Mild tortuosity is present. Bifurcation is unremarkable. Moderate tortuosity is present cervical right ICA without significant stenosis. Left carotid system: The left common carotid artery is mildly tortuous. Bifurcation is unremarkable. Moderate ICA tortuosity is  present without significant stenosis. Vertebral arteries: The left vertebral artery is the dominant vessel. Both vertebral arteries originate from the subclavian arteries without significant stenosis. No significant stenosis is present in either vertebral artery in the neck. Skeleton: Degenerative endplate changes are most prominent at C5-6 and C6-7. No focal lytic or blastic lesions are present. Other neck: The soft tissues the neck are otherwise unremarkable. Salivary glands are within normal limits. No focal mucosal lesions are present. No significant adenopathy is present. Thyroid is normal. Upper chest: Ground-glass attenuation is present in the upper lobes bilaterally. No significant consolidation is present. Thoracic inlet is within normal limits. Review of the MIP images confirms the above findings CTA HEAD FINDINGS Anterior circulation: Atherosclerotic changes are present within the cavernous internal carotid arteries bilaterally. Moderate right supraclinoid narrowing is present. No significant stenosis is present in the left through the ICA terminus. Moderate right A1 segment narrowing is present. Mild proximal left M1 segment narrowing is present. Diffuse segmental narrowing is present throughout the ACA and MCA branch vessels. The most proximal MCA stenosis involves the left temporal frontal branch. Posterior circulation: The left vertebral artery is the dominant vessel. PICA origins visualized and normal. Vertebrobasilar junction is normal. The basilar artery is normal. Both posterior cerebral arteries originate from the basilar tip. Moderate narrowing is present in the proximal inferior left P3 segment. Venous sinuses: The dural sinuses are patent. The straight sinus deep cerebral veins are intact. Cortical veins are unremarkable. Anatomic variants: None Review of the MIP images confirms the above findings CT Brain Perfusion Findings: ASPECTS: 10/10 CBF (<30%) Volume: 354m Perfusion (Tmax>6.0s) volume:  031mMismatch Volume: 54m64mMPRESSION: 1. CT perfusion is normal. 2. No emergent large vessel occlusion. 3. Moderate right supraclinoid ICA narrowing. 4. Mild proximal left M1 segment narrowing. 5. Moderate right A1 segment narrowing. 6. Scattered segmental narrowing within branch vessels bilaterally as described. 7. Moderate tortuosity of the cervical internal carotid arteries bilaterally, likely secondary to hypertension. 8. Aortic Atherosclerosis (ICD10-I70.0). These results were called by telephone at the time of interpretation on 05/12/2020 at 8:24am to provider [facility]who verbally acknowledged these results. Electronically Signed   By: ChrSan MorelleD.   On: 05/12/2020 08:35   CT Code Stroke CTA Neck W/WO contrast  Result Date: 05/12/2020 CLINICAL DATA:  New onset confusion. Word naming difficulties. Left sided drift. EXAM: CT ANGIOGRAPHY HEAD AND NECK CT PERFUSION BRAIN TECHNIQUE: Multidetector CT imaging of the head and neck was performed using the standard protocol during bolus administration of intravenous contrast. Multiplanar CT image reconstructions and MIPs were obtained to evaluate the vascular anatomy. Carotid stenosis measurements (when applicable) are obtained utilizing NASCET criteria, using the distal internal carotid diameter as the denominator. Multiphase CT imaging of the brain was performed following IV bolus contrast injection. Subsequent parametric perfusion maps were calculated using RAPID software. CONTRAST:  1054m79mNIPAQUE IOHEXOL 350 MG/ML SOLN COMPARISON:  None. FINDINGS: CTA NECK FINDINGS Aortic arch: Atherosclerotic changes are present in the distal arch. Great vessel origins are within normal limits. Three vessel configuration is present. Right carotid system: Right common carotid artery is within normal limits. Mild tortuosity is present. Bifurcation is unremarkable. Moderate tortuosity is present cervical right  ICA without significant stenosis. Left carotid  system: The left common carotid artery is mildly tortuous. Bifurcation is unremarkable. Moderate ICA tortuosity is present without significant stenosis. Vertebral arteries: The left vertebral artery is the dominant vessel. Both vertebral arteries originate from the subclavian arteries without significant stenosis. No significant stenosis is present in either vertebral artery in the neck. Skeleton: Degenerative endplate changes are most prominent at C5-6 and C6-7. No focal lytic or blastic lesions are present. Other neck: The soft tissues the neck are otherwise unremarkable. Salivary glands are within normal limits. No focal mucosal lesions are present. No significant adenopathy is present. Thyroid is normal. Upper chest: Ground-glass attenuation is present in the upper lobes bilaterally. No significant consolidation is present. Thoracic inlet is within normal limits. Review of the MIP images confirms the above findings CTA HEAD FINDINGS Anterior circulation: Atherosclerotic changes are present within the cavernous internal carotid arteries bilaterally. Moderate right supraclinoid narrowing is present. No significant stenosis is present in the left through the ICA terminus. Moderate right A1 segment narrowing is present. Mild proximal left M1 segment narrowing is present. Diffuse segmental narrowing is present throughout the ACA and MCA branch vessels. The most proximal MCA stenosis involves the left temporal frontal branch. Posterior circulation: The left vertebral artery is the dominant vessel. PICA origins visualized and normal. Vertebrobasilar junction is normal. The basilar artery is normal. Both posterior cerebral arteries originate from the basilar tip. Moderate narrowing is present in the proximal inferior left P3 segment. Venous sinuses: The dural sinuses are patent. The straight sinus deep cerebral veins are intact. Cortical veins are unremarkable. Anatomic variants: None Review of the MIP images confirms  the above findings CT Brain Perfusion Findings: ASPECTS: 10/10 CBF (<30%) Volume: 45m Perfusion (Tmax>6.0s) volume: 081mMismatch Volume: 65m73mMPRESSION: 1. CT perfusion is normal. 2. No emergent large vessel occlusion. 3. Moderate right supraclinoid ICA narrowing. 4. Mild proximal left M1 segment narrowing. 5. Moderate right A1 segment narrowing. 6. Scattered segmental narrowing within branch vessels bilaterally as described. 7. Moderate tortuosity of the cervical internal carotid arteries bilaterally, likely secondary to hypertension. 8. Aortic Atherosclerosis (ICD10-I70.0). These results were called by telephone at the time of interpretation on 05/12/2020 at 8:24am to provider [facility]who verbally acknowledged these results. Electronically Signed   By: ChrSan MorelleD.   On: 05/12/2020 08:35   MR BRAIN WO CONTRAST  Result Date: 05/12/2020 CLINICAL DATA:  Altered mental status.  Left-sided neglect.  Drift. The examination had to be discontinued prior to completion due to severe patient motion and unable to tolerate exam. EXAM: MRI HEAD WITHOUT CONTRAST TECHNIQUE: Multiplanar, multiecho pulse sequences of the brain and surrounding structures were obtained without intravenous contrast. COMPARISON:  CT head without contrast. CT perfusion and CTA head and neck. FINDINGS: Brain: Despite significant motion, 2 axial diffusion sequence is in 1 coronal diffusion sequence demonstrate no acute or subacute infarct. Moderate white matter changes are present. IMPRESSION: Diffusion-weighted images demonstrate no acute or subacute infarct. Moderate white matter disease is present. The above was relayed via text pager to [facility] 05/12/2020 at 08:50 . Electronically Signed   By: ChrSan MorelleD.   On: 05/12/2020 08:53   CT Code Stroke Cerebral Perfusion with contrast  Result Date: 05/12/2020 CLINICAL DATA:  New onset confusion. Word naming difficulties. Left sided drift. EXAM: CT ANGIOGRAPHY  HEAD AND NECK CT PERFUSION BRAIN TECHNIQUE: Multidetector CT imaging of the head and neck was performed using the standard protocol during bolus  administration of intravenous contrast. Multiplanar CT image reconstructions and MIPs were obtained to evaluate the vascular anatomy. Carotid stenosis measurements (when applicable) are obtained utilizing NASCET criteria, using the distal internal carotid diameter as the denominator. Multiphase CT imaging of the brain was performed following IV bolus contrast injection. Subsequent parametric perfusion maps were calculated using RAPID software. CONTRAST:  181m OMNIPAQUE IOHEXOL 350 MG/ML SOLN COMPARISON:  None. FINDINGS: CTA NECK FINDINGS Aortic arch: Atherosclerotic changes are present in the distal arch. Great vessel origins are within normal limits. Three vessel configuration is present. Right carotid system: Right common carotid artery is within normal limits. Mild tortuosity is present. Bifurcation is unremarkable. Moderate tortuosity is present cervical right ICA without significant stenosis. Left carotid system: The left common carotid artery is mildly tortuous. Bifurcation is unremarkable. Moderate ICA tortuosity is present without significant stenosis. Vertebral arteries: The left vertebral artery is the dominant vessel. Both vertebral arteries originate from the subclavian arteries without significant stenosis. No significant stenosis is present in either vertebral artery in the neck. Skeleton: Degenerative endplate changes are most prominent at C5-6 and C6-7. No focal lytic or blastic lesions are present. Other neck: The soft tissues the neck are otherwise unremarkable. Salivary glands are within normal limits. No focal mucosal lesions are present. No significant adenopathy is present. Thyroid is normal. Upper chest: Ground-glass attenuation is present in the upper lobes bilaterally. No significant consolidation is present. Thoracic inlet is within normal limits.  Review of the MIP images confirms the above findings CTA HEAD FINDINGS Anterior circulation: Atherosclerotic changes are present within the cavernous internal carotid arteries bilaterally. Moderate right supraclinoid narrowing is present. No significant stenosis is present in the left through the ICA terminus. Moderate right A1 segment narrowing is present. Mild proximal left M1 segment narrowing is present. Diffuse segmental narrowing is present throughout the ACA and MCA branch vessels. The most proximal MCA stenosis involves the left temporal frontal branch. Posterior circulation: The left vertebral artery is the dominant vessel. PICA origins visualized and normal. Vertebrobasilar junction is normal. The basilar artery is normal. Both posterior cerebral arteries originate from the basilar tip. Moderate narrowing is present in the proximal inferior left P3 segment. Venous sinuses: The dural sinuses are patent. The straight sinus deep cerebral veins are intact. Cortical veins are unremarkable. Anatomic variants: None Review of the MIP images confirms the above findings CT Brain Perfusion Findings: ASPECTS: 10/10 CBF (<30%) Volume: 059mPerfusion (Tmax>6.0s) volume: 16m26mismatch Volume: 16mL53mPRESSION: 1. CT perfusion is normal. 2. No emergent large vessel occlusion. 3. Moderate right supraclinoid ICA narrowing. 4. Mild proximal left M1 segment narrowing. 5. Moderate right A1 segment narrowing. 6. Scattered segmental narrowing within branch vessels bilaterally as described. 7. Moderate tortuosity of the cervical internal carotid arteries bilaterally, likely secondary to hypertension. 8. Aortic Atherosclerosis (ICD10-I70.0). These results were called by telephone at the time of interpretation on 05/12/2020 at 8:24am to provider [facility]ho verbally acknowledged these results. Electronically Signed   By: ChriSan Morelle.   On: 05/12/2020 08:35   CT HEAD CODE STROKE WO CONTRAST  Result Date:  05/12/2020 CLINICAL DATA:  Code stroke. Altered mental status. Left-sided drift. Unable to name objects. EXAM: CT HEAD WITHOUT CONTRAST TECHNIQUE: Contiguous axial images were obtained from the base of the skull through the vertex without intravenous contrast. COMPARISON:  CT head without contrast 11/06/2016. FINDINGS: Brain: Interval infarct involving the right caudate head and internal capsule is not acute. This area is markedly hypodense with volume  loss. The insular ribbon is normal bilaterally. Basal ganglia are otherwise intact. Heterogeneous hypoattenuation in the thalami is stable. No acute or focal cortical deficits are present. No acute hemorrhage or mass lesion is present. The ventricles are proportionate to the degree of atrophy. No significant extraaxial fluid collection is present. Brainstem and cerebellum are stable. Vascular: Atherosclerotic calcifications are present in the cavernous internal carotid arteries and at the dural margin of the vertebral arteries. No hyperdense vessel is present. Skull: Calvarium is intact. No focal lytic or blastic lesions are present. No significant extracranial soft tissue lesion is present. Sinuses/Orbits: The paranasal sinuses and mastoid air cells are clear. Bilateral lens replacements are noted. Globes and orbits are otherwise unremarkable. ASPECTS [facility] Stroke Program Early CT Score) - Ganglionic level infarction (caudate, lentiform nuclei, internal capsule, insula, M1-M3 cortex): 7/7 - Supraganglionic infarction (M4-M6 cortex): 3/3 Total score (0-10 with 10 being normal): IMPRESSION: 1. No acute intracranial abnormality. 2. 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 3. Other remote basal ganglia and thalamic ischemic changes are stable. 4. Stable appearance of diffuse white matter disease. 5. ASPECTS is 10/10 The above was relayed via text pager to Dr. Rory Percy on 05/12/2020 at 08:02 . Electronically Signed   By: San Morelle M.D.   On:  05/12/2020 08:02     Labs:   Basic Metabolic Panel: Recent Labs  Lab 05/12/20 0735 05/12/20 0735 05/12/20 0756 05/12/20 0756 05/13/20 0430 05/14/20 0828  NA 136  --  136  --  138 139  K 5.3*   < > 5.3*   < > 3.2* 3.1*  CL 98  --  101  --  103 105  CO2 25  --   --   --  23 21*  GLUCOSE 440*  --  461*  --  229* 162*  BUN 19  --  24*  --  18 17  CREATININE 0.97  --  0.80  --  1.02* 0.70  CALCIUM 10.0  --   --   --  8.5* 8.8*  MG  --   --   --   --   --  1.8  PHOS  --   --   --   --   --  2.1*   < > = values in this interval not displayed.   GFR Estimated Creatinine Clearance: 53.7 mL/min (by C-G formula based on SCr of 0.7 mg/dL). Liver Function Tests: Recent Labs  Lab 05/12/20 0735  AST 35  ALT 31  ALKPHOS 106  BILITOT 0.9  PROT 8.0  ALBUMIN 3.7   No results for input(s): LIPASE, AMYLASE in the last 168 hours. No results for input(s): AMMONIA in the last 168 hours. Coagulation profile Recent Labs  Lab 05/12/20 0735  INR 1.0    CBC: Recent Labs  Lab 05/12/20 0735 05/12/20 0756 05/13/20 0430 05/14/20 0828  WBC 18.6*  --  19.6* 13.2*  NEUTROABS 16.4*  --   --  8.0*  HGB 17.4* 18.0* 14.0 15.2*  HCT 52.2* 53.0* 41.9 45.2  MCV 85.4  --  85.2 84.3  PLT 262  --  258 230   Cardiac Enzymes: No results for input(s): CKTOTAL, CKMB, CKMBINDEX, TROPONINI in the last 168 hours. BNP: Invalid input(s): POCBNP CBG: Recent Labs  Lab 05/14/20 1200 05/14/20 1730 05/14/20 2116 05/15/20 0601 05/15/20 1124  GLUCAP 179* 127* 233* 114* 201*   D-Dimer No results for input(s): DDIMER in the last 72 hours. Hgb A1c No  results for input(s): HGBA1C in the last 72 hours. Lipid Profile No results for input(s): CHOL, HDL, LDLCALC, TRIG, CHOLHDL, LDLDIRECT in the last 72 hours. Thyroid function studies No results for input(s): TSH, T4TOTAL, T3FREE, THYROIDAB in the last 72 hours.  Invalid input(s): FREET3 Anemia work up No results for input(s): VITAMINB12, FOLATE,  FERRITIN, TIBC, IRON, RETICCTPCT in the last 72 hours. Microbiology Recent Results (from the past 240 hour(s))  SARS Coronavirus 2 by RT PCR (hospital order, performed in [facility] hospital lab) Nasopharyngeal Nasopharyngeal Swab     Status: None   Collection Time: 05/12/20  8:24 AM   Specimen: Nasopharyngeal Swab  Result Value Ref Range Status   SARS Coronavirus 2 NEGATIVE NEGATIVE Final    Comment: (NOTE) SARS-CoV-2 target nucleic acids are NOT DETECTED.  The SARS-CoV-2 RNA is generally detectable in upper and lower respiratory specimens during the acute phase of infection. The lowest concentration of SARS-CoV-2 viral copies this assay can detect is 250 copies / mL. A negative result does not preclude SARS-CoV-2 infection and should not be used as the sole basis for treatment or other patient management decisions.  A negative result may occur with improper specimen collection / handling, submission of specimen other than nasopharyngeal swab, presence of viral mutation(s) within the areas targeted by this assay, and inadequate number of viral copies (<250 copies / mL). A negative result must be combined with clinical observations, patient history, and epidemiological information.  Fact Sheet for Patients:   StrictlyIdeas.no  Fact Sheet for Healthcare Providers: BankingDealers.co.za  This test is not yet approved or  cleared by the [place] FDA and has been authorized for detection and/or diagnosis of SARS-CoV-2 by FDA under an Emergency Use Authorization (EUA).  This EUA will remain in effect (meaning this test can be used) for the duration of the COVID-19 declaration under Section 564(b)(1) of the Act, 21 U.S.C. section 360bbb-3(b)(1), unless the authorization is terminated or revoked sooner.  Performed at [facility], [place] [address], [place], [place] [address]   Culture, Urine     Status: Abnormal   Collection  Time: 05/12/20  9:55 AM   Specimen: Urine, Random  Result Value Ref Range Status   Specimen Description URINE, RANDOM  Final   Special Requests   Final    NONE Performed at [facility], [place] [address], [place], [place] [address]    Culture (A)  Final    >=100,000 COLONIES/mL GROUP B STREP(S.AGALACTIAE)ISOLATED TESTING AGAINST S. AGALACTIAE NOT ROUTINELY PERFORMED DUE TO PREDICTABILITY OF AMP/PEN/VAN SUSCEPTIBILITY. 30,000 COLONIES/mL KLEBSIELLA PNEUMONIAE    Report Status 05/15/2020 FINAL  Final   Organism ID, Bacteria KLEBSIELLA PNEUMONIAE (A)  Final      Susceptibility   Klebsiella pneumoniae - MIC*    AMPICILLIN >=32 RESISTANT Resistant     CEFAZOLIN <=4 SENSITIVE Sensitive     CEFTRIAXONE <=0.25 SENSITIVE Sensitive     CIPROFLOXACIN <=0.25 SENSITIVE Sensitive     GENTAMICIN 8 INTERMEDIATE Intermediate     IMIPENEM <=0.25 SENSITIVE Sensitive     NITROFURANTOIN 64 INTERMEDIATE Intermediate     TRIMETH/SULFA <=20 SENSITIVE Sensitive     AMPICILLIN/SULBACTAM 4 SENSITIVE Sensitive     PIP/TAZO <=4 SENSITIVE Sensitive     * 30,000 COLONIES/mL KLEBSIELLA PNEUMONIAE  Blood culture (routine x 2)     Status: None (Preliminary result)   Collection Time: 05/12/20 10:19 AM   Specimen: BLOOD LEFT WRIST  Result Value Ref Range Status   Specimen Description BLOOD  LEFT WRIST  Final   Special Requests   Final    BOTTLES DRAWN AEROBIC AND ANAEROBIC Blood Culture results may not be optimal due to an inadequate volume of blood received in culture bottles   Culture   Final    NO GROWTH 3 DAYS Performed at [facility], [place] [address], [place], [place] [address]    Report Status PENDING  Incomplete  Blood culture (routine x 2)     Status: None (Preliminary result)   Collection Time: 05/12/20 10:20 AM   Specimen: BLOOD  Result Value Ref Range Status   Specimen Description BLOOD RIGHT ANTECUBITAL  Final   Special Requests   Final    BOTTLES DRAWN AEROBIC AND ANAEROBIC Blood  Culture results may not be optimal due to an inadequate volume of blood received in culture bottles   Culture   Final    NO GROWTH 3 DAYS Performed at [facility], [place] [address] [place], [place] [address]    Report Status PENDING  Incomplete    Please note: You were cared for by a hospitalist during your hospital stay. Once you are discharged, your primary care physician will handle any further medical issues. Please note that NO REFILLS for any discharge medications will be authorized once you are discharged, as it is imperative that you return to your primary care physician (or establish a relationship with a primary care physician if you do not have one) for your post hospital discharge needs so that they can reassess your need for medications and monitor your lab values.  Signed: Marlowe Aschoff Wynn Kernes  [facility] 05/15/2020, 11:40 AM

## 2020-05-15 NOTE — TOC Transition Note (Signed)
Transition of Care [facility]) - CM/SW Discharge Note   Patient Details  Name: Caitlin Harrington MRN: [id number] Date of Birth: 1946-03-06  Transition of Care [facility]) CM/SW Contact:  Kermit Balo, RN Phone Number: 05/15/2020, 2:45 PM   Clinical Narrative:    Pt discharging home with [facility] services through [facility]. Cassie with [facility] accepted the referral.  Hospital f/u appt made for Monday.  Fiance to provide supervision starting tomorrow. Son to provide transport home today.    Final next level of care: Home w Home Health Services Barriers to Discharge: No Barriers Identified   Patient Goals and CMS Choice   CMS Medicare.gov Compare Post Acute Care list provided to:: Patient Choice offered to / list presented to : Patient  Discharge Placement                       Discharge Plan and Services                          HH Arranged: RN [facility] Agency: [facility] Date [facility] Agency Contacted: 05/15/20   Representative spoke with at [facility] Agency: Cassie  Social Determinants of Health (SDOH) Interventions     Readmission Risk Interventions No flowsheet data found.

## 2020-05-16 ENCOUNTER — Other Ambulatory Visit: Payer: Self-pay

## 2020-05-16 DIAGNOSIS — E669 Obesity, unspecified: Secondary | ICD-10-CM | POA: Diagnosis not present

## 2020-05-16 DIAGNOSIS — E1165 Type 2 diabetes mellitus with hyperglycemia: Secondary | ICD-10-CM | POA: Diagnosis not present

## 2020-05-16 DIAGNOSIS — Z794 Long term (current) use of insulin: Secondary | ICD-10-CM | POA: Diagnosis not present

## 2020-05-16 DIAGNOSIS — G9340 Encephalopathy, unspecified: Secondary | ICD-10-CM | POA: Diagnosis not present

## 2020-05-16 DIAGNOSIS — M1000 Idiopathic gout, unspecified site: Secondary | ICD-10-CM | POA: Diagnosis not present

## 2020-05-16 DIAGNOSIS — I10 Essential (primary) hypertension: Secondary | ICD-10-CM | POA: Diagnosis not present

## 2020-05-16 DIAGNOSIS — F329 Major depressive disorder, single episode, unspecified: Secondary | ICD-10-CM | POA: Diagnosis not present

## 2020-05-16 DIAGNOSIS — Z8616 Personal history of COVID-19: Secondary | ICD-10-CM | POA: Diagnosis not present

## 2020-05-16 DIAGNOSIS — N390 Urinary tract infection, site not specified: Secondary | ICD-10-CM | POA: Diagnosis not present

## 2020-05-16 DIAGNOSIS — J4520 Mild intermittent asthma, uncomplicated: Secondary | ICD-10-CM | POA: Diagnosis not present

## 2020-05-16 DIAGNOSIS — E1169 Type 2 diabetes mellitus with other specified complication: Secondary | ICD-10-CM

## 2020-05-16 NOTE — Consult Note (Signed)
   [facility] Inpatient Consult   05/16/2020  MINEOLA DUAN 1946/09/30 [id number]   [facility] [[facility]]  Accountable Care Organization [ACO] Patient:  Medicare [facility] referral: Diabetes follow up Hgb A1C 13.2  Primary Care Provider is Leodis Sias, MD, [facility] Physician,  this provider is listed to provide the transition of care [TOC] for post hospital follow up.   Plan: Follow up for post hospital care management and disease management needs.    For questions contact:   Charlesetta Shanks, RN BSN CCM [facility]  [phone] business mobile phone Toll free office [phone]  Fax number: [phone] [email or web address]

## 2020-05-17 ENCOUNTER — Other Ambulatory Visit: Payer: Self-pay | Admitting: *Deleted

## 2020-05-17 ENCOUNTER — Encounter: Payer: Self-pay | Admitting: *Deleted

## 2020-05-17 LAB — CULTURE, BLOOD (ROUTINE X 2)
Culture: NO GROWTH
Culture: NO GROWTH

## 2020-05-17 NOTE — Patient Outreach (Signed)
[facility] Customer service manager [facility]) Care Management [facility] CM Telephone Outreach, new referral PCP office completes Transition of Care follow up post-hospital discharge Post-hospital discharge day # 2 Unsuccessful (consecutive) outreach attempt # 1  05/17/2020  MALEY VENEZIA 24-Jul-1946 [id number]  Unsuccessful initial telephone outreach attempt to Caitlin Harrington, 74 y/o female referred to [facility] RN CM 05/16/20 by [facility] Liaison after recent hospitalization June 20-23, 2021 for AMS/ metabolic encephalopathy with UTI and hyperglycemia.  Patient was discharged home to self-care with home health services in place through [facility].  Patient has history including, but not limited to, asthma; morbid obesity; depression; and recent COVID-19 infection requiring hospitalization (February 2021).  With today's call attempt, received automated outgoing message stating that patient has a voice mail box that has not yet been set up; unable to leave patient HIPAA compliant voice mail message requesting return call back.  Plan:  Will place [facility] CM unsuccessful patient outreach letter in mail requesting call back in writing  Will re-attempt [facility] CM telephone outreach within 4 business days if I do not hear back from patient first.  Caryl Pina, RN, BSN, CCRN Alumnus [facility] Coordinator [facility] Care Management  [phone]

## 2020-05-23 ENCOUNTER — Encounter: Payer: Self-pay | Admitting: *Deleted

## 2020-05-23 ENCOUNTER — Other Ambulatory Visit: Payer: Self-pay | Admitting: *Deleted

## 2020-05-23 NOTE — Patient Outreach (Signed)
[facility] Customer service manager [facility]) Care Management [facility] CM Telephone Outreach PCP office completes Transition of Care follow up post-hospital discharge Post-hospital discharge day # 8 Unsuccessful (consecutive) outreach attempt # 2- new referral  05/23/2020  Caitlin Harrington 26-Aug-1946 [id number]  Unsuccessful second consecutive telephone outreach attempt to [name], 74 y/o female referred to [facility] RN CM 05/16/20 by [facility] Liaison after recent hospitalization June 20-23, 2021 for AMS/ metabolic encephalopathy with UTI and hyperglycemia.  Patient was discharged home to self-care with home health services in place through [facility].  Patient has history including, but not limited to, asthma; morbid obesity; depression; and recent COVID-19 infection requiring hospitalization (February 2021).  HIPAA compliant voice mail message left for patient, requesting return call back.  Plan:  verified [facility] CM unsuccessful patient outreach letter in mail requesting call back in writing May 17, 2020  Will re-attempt [facility] CM telephone outreach within 4 business days if I do not hear back from patient first  Caryl Pina, RN, BSN, [employer] Coordinator [facility] Care Management  [phone]

## 2020-05-28 ENCOUNTER — Other Ambulatory Visit: Payer: Self-pay | Admitting: *Deleted

## 2020-05-28 ENCOUNTER — Encounter: Payer: Self-pay | Admitting: *Deleted

## 2020-05-28 NOTE — Patient Outreach (Signed)
[facility] Customer service manager [facility]) Care Management [facility] CM Telephone Outreach, new referral PCP office completes Transition of Care follow up post-hospital discharge (Dr. Zollie Beckers) Post-hospital discharge day # 13 Unsuccessful consecutive third outreach attempt, new referral  05/28/2020  Caitlin Harrington [birth year] [id number]  Unsuccessful third consecutive telephone outreach attempt to [name], 74 y/o female referred to [facility] RN CM 05/16/20 by [facility] Liaison after recent hospitalization June 20-23, 2035for AMS/ metabolic encephalopathy with UTI and hyperglycemia. Patient was discharged home to self-care with home health services in place through [facility]. Patient has history including, but not limited to, asthma; morbid obesity; depression; and recent COVID-19 infection requiring hospitalization (February 2021).  HIPAA compliant voice mail message left for patient, requesting return call back.  Plan:  Verified [facility] CM unsuccessful patient outreach letter in mail requesting call back in writing May 17, 2020  Will re-attempt [facility] CM telephone outreachwithin 3 weeks if I do not hear back from patient first  Caryl Pina, RN, BSN, [employer] Coordinator [facility] Care Management  [phone]

## 2020-06-19 ENCOUNTER — Encounter: Payer: Self-pay | Admitting: *Deleted

## 2020-06-19 ENCOUNTER — Other Ambulatory Visit: Payer: Self-pay | Admitting: *Deleted

## 2020-06-19 NOTE — Patient Outreach (Signed)
[facility] Customer service manager [facility]) Care Management [facility] CM Telephone Outreach, final attempt PCP office completes Transition of Care follow up post-hospital discharge Post-hospital discharge day # 35 Unsuccessful (consecutive) fourth outreach attempt  06/19/2020  Caitlin Harrington [birth year] [id number]  Unsuccessfulfourth consecutivetelephone outreach attempt to [name], 74 y/o female referred to [facility] RN CM 05/16/20 by [facility] Liaison after recent hospitalization June 20-23, 2060for AMS/ metabolic encephalopathy with UTI and hyperglycemia. Patient was discharged home to self-care with home health services in place through [facility]. Patient has history including, but not limited to, asthma; morbid obesity; depression; and recent COVID-19 infection requiring hospitalization (February 2021).  With today's call attempt, I received an automated outgoing message stating that patient's voice mail box is full and can not accept messages; unable to leave patient HIPAA compliant voice message requesting call bac.  Plan:  Verified[facility] CM unsuccessful patient outreach letter in mail requesting call back in writing May 17, 2020  Will make patient inactive with [facility] CM program and make PCP aware of same- unable to establish contact with patient  Caryl Pina, RN, BSN, CCRN Alumnus [facility] Care Management  [phone]

## 2020-10-23 DIAGNOSIS — I639 Cerebral infarction, unspecified: Secondary | ICD-10-CM

## 2020-10-23 HISTORY — DX: Cerebral infarction, unspecified: I63.9

## 2020-10-25 ENCOUNTER — Encounter (HOSPITAL_COMMUNITY): Payer: Self-pay | Admitting: *Deleted

## 2020-10-25 ENCOUNTER — Other Ambulatory Visit: Payer: Self-pay

## 2020-10-25 ENCOUNTER — Inpatient Hospital Stay (HOSPITAL_COMMUNITY)
Admission: EM | Admit: 2020-10-25 | Discharge: 2020-10-31 | DRG: 064 | Disposition: A | Discharge status: Home / Self Care | Payer: Medicare Other | Attending: Internal Medicine | Admitting: Internal Medicine

## 2020-10-25 DIAGNOSIS — R4182 Altered mental status, unspecified: Secondary | ICD-10-CM | POA: Diagnosis not present

## 2020-10-25 DIAGNOSIS — G4700 Insomnia, unspecified: Secondary | ICD-10-CM | POA: Diagnosis present

## 2020-10-25 DIAGNOSIS — E669 Obesity, unspecified: Secondary | ICD-10-CM

## 2020-10-25 DIAGNOSIS — Z20822 Contact with and (suspected) exposure to covid-19: Secondary | ICD-10-CM | POA: Diagnosis not present

## 2020-10-25 DIAGNOSIS — I517 Cardiomegaly: Secondary | ICD-10-CM | POA: Diagnosis not present

## 2020-10-25 DIAGNOSIS — Z794 Long term (current) use of insulin: Secondary | ICD-10-CM

## 2020-10-25 DIAGNOSIS — I63432 Cerebral infarction due to embolism of left posterior cerebral artery: Secondary | ICD-10-CM | POA: Diagnosis not present

## 2020-10-25 DIAGNOSIS — Z9114 Patient's other noncompliance with medication regimen: Secondary | ICD-10-CM

## 2020-10-25 DIAGNOSIS — G9341 Metabolic encephalopathy: Secondary | ICD-10-CM | POA: Diagnosis not present

## 2020-10-25 DIAGNOSIS — E1165 Type 2 diabetes mellitus with hyperglycemia: Secondary | ICD-10-CM | POA: Diagnosis present

## 2020-10-25 DIAGNOSIS — F331 Major depressive disorder, recurrent, moderate: Secondary | ICD-10-CM | POA: Diagnosis not present

## 2020-10-25 DIAGNOSIS — I6330 Cerebral infarction due to thrombosis of unspecified cerebral artery: Secondary | ICD-10-CM

## 2020-10-25 DIAGNOSIS — Z90710 Acquired absence of both cervix and uterus: Secondary | ICD-10-CM

## 2020-10-25 DIAGNOSIS — N179 Acute kidney failure, unspecified: Secondary | ICD-10-CM | POA: Diagnosis present

## 2020-10-25 DIAGNOSIS — E785 Hyperlipidemia, unspecified: Secondary | ICD-10-CM | POA: Diagnosis present

## 2020-10-25 DIAGNOSIS — I10 Essential (primary) hypertension: Secondary | ICD-10-CM | POA: Diagnosis present

## 2020-10-25 DIAGNOSIS — Z8616 Personal history of COVID-19: Secondary | ICD-10-CM

## 2020-10-25 DIAGNOSIS — B379 Candidiasis, unspecified: Secondary | ICD-10-CM | POA: Diagnosis present

## 2020-10-25 DIAGNOSIS — Z79899 Other long term (current) drug therapy: Secondary | ICD-10-CM

## 2020-10-25 DIAGNOSIS — R4189 Other symptoms and signs involving cognitive functions and awareness: Secondary | ICD-10-CM

## 2020-10-25 DIAGNOSIS — Z6836 Body mass index (BMI) 36.0-36.9, adult: Secondary | ICD-10-CM

## 2020-10-25 DIAGNOSIS — T7840XA Allergy, unspecified, initial encounter: Secondary | ICD-10-CM | POA: Diagnosis present

## 2020-10-25 DIAGNOSIS — N390 Urinary tract infection, site not specified: Secondary | ICD-10-CM

## 2020-10-25 DIAGNOSIS — R45851 Suicidal ideations: Secondary | ICD-10-CM | POA: Diagnosis present

## 2020-10-25 DIAGNOSIS — E6609 Other obesity due to excess calories: Secondary | ICD-10-CM | POA: Diagnosis present

## 2020-10-25 DIAGNOSIS — M109 Gout, unspecified: Secondary | ICD-10-CM | POA: Diagnosis present

## 2020-10-25 DIAGNOSIS — E7800 Pure hypercholesterolemia, unspecified: Secondary | ICD-10-CM | POA: Diagnosis present

## 2020-10-25 DIAGNOSIS — Z888 Allergy status to other drugs, medicaments and biological substances status: Secondary | ICD-10-CM

## 2020-10-25 DIAGNOSIS — B370 Candidal stomatitis: Secondary | ICD-10-CM | POA: Diagnosis present

## 2020-10-25 DIAGNOSIS — E876 Hypokalemia: Secondary | ICD-10-CM | POA: Diagnosis not present

## 2020-10-25 DIAGNOSIS — E119 Type 2 diabetes mellitus without complications: Secondary | ICD-10-CM

## 2020-10-25 DIAGNOSIS — A419 Sepsis, unspecified organism: Secondary | ICD-10-CM | POA: Diagnosis present

## 2020-10-25 DIAGNOSIS — Z833 Family history of diabetes mellitus: Secondary | ICD-10-CM

## 2020-10-25 DIAGNOSIS — G9340 Encephalopathy, unspecified: Secondary | ICD-10-CM | POA: Diagnosis present

## 2020-10-25 LAB — CBG MONITORING, ED: Glucose-Capillary: 280 mg/dL — ABNORMAL HIGH (ref 70–99)

## 2020-10-25 NOTE — ED Notes (Signed)
Lesly Rubenstein, grand daughter, [phone] would like an update when available
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
## 2020-10-26 ENCOUNTER — Emergency Department (HOSPITAL_COMMUNITY): Payer: Medicare Other

## 2020-10-26 DIAGNOSIS — E785 Hyperlipidemia, unspecified: Secondary | ICD-10-CM | POA: Diagnosis present

## 2020-10-26 DIAGNOSIS — G4700 Insomnia, unspecified: Secondary | ICD-10-CM | POA: Diagnosis present

## 2020-10-26 DIAGNOSIS — N179 Acute kidney failure, unspecified: Secondary | ICD-10-CM | POA: Diagnosis present

## 2020-10-26 DIAGNOSIS — Z833 Family history of diabetes mellitus: Secondary | ICD-10-CM | POA: Diagnosis not present

## 2020-10-26 DIAGNOSIS — Z794 Long term (current) use of insulin: Secondary | ICD-10-CM | POA: Diagnosis not present

## 2020-10-26 DIAGNOSIS — M109 Gout, unspecified: Secondary | ICD-10-CM | POA: Diagnosis present

## 2020-10-26 DIAGNOSIS — T7840XA Allergy, unspecified, initial encounter: Secondary | ICD-10-CM | POA: Diagnosis present

## 2020-10-26 DIAGNOSIS — Z888 Allergy status to other drugs, medicaments and biological substances status: Secondary | ICD-10-CM | POA: Diagnosis not present

## 2020-10-26 DIAGNOSIS — Z6836 Body mass index (BMI) 36.0-36.9, adult: Secondary | ICD-10-CM | POA: Diagnosis not present

## 2020-10-26 DIAGNOSIS — I63532 Cerebral infarction due to unspecified occlusion or stenosis of left posterior cerebral artery: Secondary | ICD-10-CM | POA: Diagnosis not present

## 2020-10-26 DIAGNOSIS — E1165 Type 2 diabetes mellitus with hyperglycemia: Secondary | ICD-10-CM | POA: Diagnosis present

## 2020-10-26 DIAGNOSIS — Z8616 Personal history of COVID-19: Secondary | ICD-10-CM | POA: Diagnosis not present

## 2020-10-26 DIAGNOSIS — I6389 Other cerebral infarction: Secondary | ICD-10-CM | POA: Diagnosis not present

## 2020-10-26 DIAGNOSIS — R45851 Suicidal ideations: Secondary | ICD-10-CM | POA: Diagnosis present

## 2020-10-26 DIAGNOSIS — A419 Sepsis, unspecified organism: Secondary | ICD-10-CM | POA: Diagnosis not present

## 2020-10-26 DIAGNOSIS — B379 Candidiasis, unspecified: Secondary | ICD-10-CM | POA: Diagnosis present

## 2020-10-26 DIAGNOSIS — R4182 Altered mental status, unspecified: Secondary | ICD-10-CM | POA: Diagnosis not present

## 2020-10-26 DIAGNOSIS — Z20822 Contact with and (suspected) exposure to covid-19: Secondary | ICD-10-CM | POA: Diagnosis present

## 2020-10-26 DIAGNOSIS — B370 Candidal stomatitis: Secondary | ICD-10-CM | POA: Diagnosis present

## 2020-10-26 DIAGNOSIS — G319 Degenerative disease of nervous system, unspecified: Secondary | ICD-10-CM | POA: Diagnosis not present

## 2020-10-26 DIAGNOSIS — I6782 Cerebral ischemia: Secondary | ICD-10-CM | POA: Diagnosis not present

## 2020-10-26 DIAGNOSIS — Z9114 Patient's other noncompliance with medication regimen: Secondary | ICD-10-CM | POA: Diagnosis not present

## 2020-10-26 DIAGNOSIS — Z90710 Acquired absence of both cervix and uterus: Secondary | ICD-10-CM | POA: Diagnosis not present

## 2020-10-26 DIAGNOSIS — F331 Major depressive disorder, recurrent, moderate: Secondary | ICD-10-CM | POA: Diagnosis not present

## 2020-10-26 DIAGNOSIS — Z79899 Other long term (current) drug therapy: Secondary | ICD-10-CM | POA: Diagnosis not present

## 2020-10-26 DIAGNOSIS — I63432 Cerebral infarction due to embolism of left posterior cerebral artery: Secondary | ICD-10-CM | POA: Diagnosis present

## 2020-10-26 DIAGNOSIS — I517 Cardiomegaly: Secondary | ICD-10-CM | POA: Diagnosis not present

## 2020-10-26 DIAGNOSIS — E876 Hypokalemia: Secondary | ICD-10-CM | POA: Diagnosis not present

## 2020-10-26 DIAGNOSIS — E119 Type 2 diabetes mellitus without complications: Secondary | ICD-10-CM | POA: Diagnosis not present

## 2020-10-26 DIAGNOSIS — N390 Urinary tract infection, site not specified: Secondary | ICD-10-CM | POA: Diagnosis not present

## 2020-10-26 DIAGNOSIS — I6623 Occlusion and stenosis of bilateral posterior cerebral arteries: Secondary | ICD-10-CM | POA: Diagnosis not present

## 2020-10-26 DIAGNOSIS — G9341 Metabolic encephalopathy: Secondary | ICD-10-CM | POA: Diagnosis present

## 2020-10-26 DIAGNOSIS — E6609 Other obesity due to excess calories: Secondary | ICD-10-CM | POA: Diagnosis present

## 2020-10-26 DIAGNOSIS — E7800 Pure hypercholesterolemia, unspecified: Secondary | ICD-10-CM | POA: Diagnosis present

## 2020-10-26 DIAGNOSIS — J3489 Other specified disorders of nose and nasal sinuses: Secondary | ICD-10-CM | POA: Diagnosis not present

## 2020-10-26 DIAGNOSIS — I10 Essential (primary) hypertension: Secondary | ICD-10-CM | POA: Diagnosis not present

## 2020-10-26 DIAGNOSIS — I6330 Cerebral infarction due to thrombosis of unspecified cerebral artery: Secondary | ICD-10-CM | POA: Diagnosis not present

## 2020-10-26 LAB — URINALYSIS, ROUTINE W REFLEX MICROSCOPIC
Bilirubin Urine: NEGATIVE
Glucose, UA: >=500 mg/dL — AB
Ketones, ur: 5 mg/dL — AB
Nitrite: NEGATIVE
Protein, ur: 100 mg/dL — AB
Specific Gravity, Urine: 1.021 (ref 1.005–1.030)
pH: 5 (ref 5.0–8.0)

## 2020-10-26 LAB — CBC
HCT: 45 % (ref 36.0–46.0)
Hemoglobin: 16 g/dL — ABNORMAL HIGH (ref 12.0–15.0)
MCH: 29.3 pg (ref 26.0–34.0)
MCHC: 35.6 g/dL (ref 30.0–36.0)
MCV: 82.3 fL (ref 80.0–100.0)
Platelets: 351 10*3/uL (ref 150–400)
RBC: 5.47 MIL/uL — ABNORMAL HIGH (ref 3.87–5.11)
RDW: 12.7 % (ref 11.5–15.5)
WBC: 15.7 10*3/uL — ABNORMAL HIGH (ref 4.0–10.5)
nRBC: 0 % (ref 0.0–0.2)

## 2020-10-26 LAB — COMPREHENSIVE METABOLIC PANEL
ALT: 16 U/L (ref 0–44)
AST: 19 U/L (ref 15–41)
Albumin: 3.7 g/dL (ref 3.5–5.0)
Alkaline Phosphatase: 108 U/L (ref 38–126)
Anion gap: 15 (ref 5–15)
BUN: 22 mg/dL (ref 8–23)
CO2: 22 mmol/L (ref 22–32)
Calcium: 9.5 mg/dL (ref 8.9–10.3)
Chloride: 93 mmol/L — ABNORMAL LOW (ref 98–111)
Creatinine, Ser: 1.08 mg/dL — ABNORMAL HIGH (ref 0.44–1.00)
GFR, Estimated: 55 mL/min — ABNORMAL LOW (ref >60)
Glucose, Bld: 275 mg/dL — ABNORMAL HIGH (ref 70–99)
Potassium: 3.4 mmol/L — ABNORMAL LOW (ref 3.5–5.1)
Sodium: 130 mmol/L — ABNORMAL LOW (ref 135–145)
Total Bilirubin: 1.2 mg/dL (ref 0.3–1.2)
Total Protein: 8 g/dL (ref 6.5–8.1)

## 2020-10-26 LAB — ETHANOL: Alcohol, Ethyl (B): <10 mg/dL (ref <10)

## 2020-10-26 LAB — RAPID URINE DRUG SCREEN, HOSP PERFORMED
Amphetamines: NOT DETECTED
Barbiturates: NOT DETECTED
Benzodiazepines: NOT DETECTED
Cocaine: NOT DETECTED
Opiates: NOT DETECTED
Tetrahydrocannabinol: NOT DETECTED

## 2020-10-26 LAB — GLUCOSE, CAPILLARY
Glucose-Capillary: 180 mg/dL — ABNORMAL HIGH (ref 70–99)
Glucose-Capillary: 186 mg/dL — ABNORMAL HIGH (ref 70–99)
Glucose-Capillary: 178 mg/dL — ABNORMAL HIGH (ref 70–99)

## 2020-10-26 LAB — RESP PANEL BY RT-PCR (FLU A&B, COVID) ARPGX2
Influenza A by PCR: NEGATIVE
Influenza B by PCR: NEGATIVE
SARS Coronavirus 2 by RT PCR: NEGATIVE

## 2020-10-26 LAB — PROCALCITONIN: Procalcitonin: <0.1 ng/mL

## 2020-10-26 LAB — TSH: TSH: 3.756 u[IU]/mL (ref 0.350–4.500)

## 2020-10-26 LAB — I-STAT VENOUS BLOOD GAS, ED
Acid-Base Excess: 5 mmol/L — ABNORMAL HIGH (ref 0.0–2.0)
Bicarbonate: 31 mmol/L — ABNORMAL HIGH (ref 20.0–28.0)
Calcium, Ion: 1.16 mmol/L (ref 1.15–1.40)
HCT: 46 % (ref 36.0–46.0)
Hemoglobin: 15.6 g/dL — ABNORMAL HIGH (ref 12.0–15.0)
O2 Saturation: 25 %
Patient temperature: 37
Potassium: 4.8 mmol/L (ref 3.5–5.1)
Sodium: 133 mmol/L — ABNORMAL LOW (ref 135–145)
TCO2: 32 mmol/L (ref 22–32)
pCO2, Ven: 48.5 mmHg (ref 44.0–60.0)
pH, Ven: 7.413 (ref 7.250–7.430)
pO2, Ven: 17 mmHg — CL (ref 32.0–45.0)

## 2020-10-26 LAB — LACTIC ACID, PLASMA
Lactic Acid, Venous: 2.6 mmol/L (ref 0.5–1.9)
Lactic Acid, Venous: 1.7 mmol/L (ref 0.5–1.9)

## 2020-10-26 LAB — HEMOGLOBIN A1C
Hgb A1c MFr Bld: 10 % — ABNORMAL HIGH (ref 4.8–5.6)
Mean Plasma Glucose: 240.3 mg/dL

## 2020-10-26 LAB — T4, FREE: Free T4: 0.74 ng/dL (ref 0.61–1.12)

## 2020-10-26 LAB — CORTISOL: Cortisol, Plasma: 36.6 ug/dL

## 2020-10-26 LAB — AMMONIA: Ammonia: 32 umol/L (ref 9–35)

## 2020-10-26 MED ORDER — VANCOMYCIN HCL 1750 MG/350ML IV SOLN
1750.0000 mg | Freq: Once | INTRAVENOUS | Status: AC
  Administered 2020-10-26: 1750 mg via INTRAVENOUS
  Filled 2020-10-26: qty 350

## 2020-10-26 MED ORDER — LACTATED RINGERS IV SOLN: INTRAVENOUS | Status: DC

## 2020-10-26 MED ORDER — METRONIDAZOLE 500 MG PO TABS
500.0000 mg | ORAL_TABLET | Freq: Three times a day (TID) | ORAL | Status: DC
  Administered 2020-10-26 – 2020-10-27 (×3): 500 mg via ORAL
  Filled 2020-10-26 (×3): qty 1

## 2020-10-26 MED ORDER — NYSTATIN 100000 UNIT/ML MT SUSP
5.0000 mL | Freq: Four times a day (QID) | OROMUCOSAL | Status: DC
  Administered 2020-10-26 – 2020-10-30 (×17): 500000 [IU] via ORAL
  Filled 2020-10-26 (×19): qty 5

## 2020-10-26 MED ORDER — SODIUM CHLORIDE 0.9 % IV SOLN
2.0000 g | Freq: Two times a day (BID) | INTRAVENOUS | Status: DC
  Administered 2020-10-26 – 2020-10-27 (×2): 2 g via INTRAVENOUS
  Filled 2020-10-26 (×2): qty 2

## 2020-10-26 MED ORDER — INSULIN GLARGINE 100 UNIT/ML ~~LOC~~ SOLN
15.0000 [IU] | Freq: Two times a day (BID) | SUBCUTANEOUS | Status: DC
  Administered 2020-10-26 – 2020-10-30 (×10): 15 [IU] via SUBCUTANEOUS
  Filled 2020-10-26 (×12): qty 0.15

## 2020-10-26 MED ORDER — SODIUM CHLORIDE 0.9 % IV SOLN
1000.0000 mL | INTRAVENOUS | Status: DC
  Administered 2020-10-26: 1000 mL via INTRAVENOUS

## 2020-10-26 MED ORDER — CARVEDILOL 3.125 MG PO TABS
3.1250 mg | ORAL_TABLET | Freq: Two times a day (BID) | ORAL | Status: DC
  Administered 2020-10-26 – 2020-10-27 (×2): 3.125 mg via ORAL
  Filled 2020-10-26 (×2): qty 1

## 2020-10-26 MED ORDER — VANCOMYCIN HCL IN DEXTROSE 1-5 GM/200ML-% IV SOLN: 1000.0000 mg | Freq: Once | INTRAVENOUS | Status: DC

## 2020-10-26 MED ORDER — SODIUM CHLORIDE 0.9% FLUSH
3.0000 mL | Freq: Two times a day (BID) | INTRAVENOUS | Status: DC
  Administered 2020-10-26 – 2020-10-30 (×4): 3 mL via INTRAVENOUS

## 2020-10-26 MED ORDER — SODIUM CHLORIDE 0.9 % IV SOLN
1.0000 g | Freq: Once | INTRAVENOUS | Status: AC
  Administered 2020-10-26: 1 g via INTRAVENOUS
  Filled 2020-10-26: qty 10

## 2020-10-26 MED ORDER — INSULIN ASPART 100 UNIT/ML ~~LOC~~ SOLN
0.0000 [IU] | Freq: Three times a day (TID) | SUBCUTANEOUS | Status: DC
  Administered 2020-10-26 – 2020-10-28 (×6): 3 [IU] via SUBCUTANEOUS
  Administered 2020-10-28 (×2): 2 [IU] via SUBCUTANEOUS
  Administered 2020-10-29: 8 [IU] via SUBCUTANEOUS
  Administered 2020-10-29: 2 [IU] via SUBCUTANEOUS
  Administered 2020-10-29 – 2020-10-30 (×2): 3 [IU] via SUBCUTANEOUS
  Administered 2020-10-30: 2 [IU] via SUBCUTANEOUS

## 2020-10-26 MED ORDER — VANCOMYCIN HCL 1750 MG/350ML IV SOLN
1750.0000 mg | Freq: Once | INTRAVENOUS | Status: DC
  Filled 2020-10-26 (×2): qty 350

## 2020-10-26 MED ORDER — HYDRALAZINE HCL 20 MG/ML IJ SOLN
5.0000 mg | Freq: Three times a day (TID) | INTRAMUSCULAR | Status: DC | PRN
  Administered 2020-10-26 – 2020-10-27 (×2): 5 mg via INTRAVENOUS
  Filled 2020-10-26 (×2): qty 1

## 2020-10-26 MED ORDER — SODIUM CHLORIDE 0.9 % IV BOLUS (SEPSIS)
1000.0000 mL | Freq: Once | INTRAVENOUS | Status: AC
  Administered 2020-10-26: 1000 mL via INTRAVENOUS

## 2020-10-26 MED ORDER — VANCOMYCIN HCL IN DEXTROSE 1-5 GM/200ML-% IV SOLN
1000.0000 mg | INTRAVENOUS | Status: DC
  Filled 2020-10-26: qty 200

## 2020-10-26 MED ORDER — ENOXAPARIN SODIUM 40 MG/0.4ML ~~LOC~~ SOLN
40.0000 mg | SUBCUTANEOUS | Status: DC
  Administered 2020-10-26 – 2020-10-30 (×5): 40 mg via SUBCUTANEOUS
  Filled 2020-10-26 (×6): qty 0.4

## 2020-10-26 MED ORDER — INSULIN ASPART 100 UNIT/ML ~~LOC~~ SOLN
0.0000 [IU] | Freq: Every day | SUBCUTANEOUS | Status: DC
  Administered 2020-10-30: 2 [IU] via SUBCUTANEOUS

## 2020-10-26 NOTE — ED Notes (Signed)
Pt remains alert in waiting, pt son/daughter n law did call back, said the pt does not seem lucid and does appear to be confused. Assured him pt has been evaluated in triage and ok for him to stay with her in lobby d/t AMS.
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
## 2020-10-26 NOTE — Progress Notes (Signed)
Elink monitoring for sepsis. 

## 2020-10-26 NOTE — ED Notes (Signed)
Pt son called back, stating that the patient's boyfriend brought her here. Said the BF was traveling and just got home today, and the patient was very confused and the dogs were running around outside, which is not normal. Son said the last time he saw her was thanksgiving and she was normal and is unsure if she is taking all her medication as prescribed, last time BF saw her was Tuesday. I let her talk to her son, appears to have much clearer conversation with him on the phone, continues to say she is sleepy and been awake all day.

## 2020-10-26 NOTE — Progress Notes (Signed)
Pt arrived to room 6N31 via stretcher from the ED. Received report from [place], [place]. See assessment. Will continue to monitor.

## 2020-10-26 NOTE — Plan of Care (Signed)
  Problem: Clinical Measurements: Goal: Signs and symptoms of infection will decrease Outcome: Progressing   Problem: Education: Goal: Knowledge of General Education information will improve Description: Including pain rating scale, medication(s)/side effects and non-pharmacologic comfort measures Outcome: Progressing   Problem: Health Behavior/Discharge Planning: Goal: Ability to manage health-related needs will improve Outcome: Progressing   Problem: Pain Managment: Goal: General experience of comfort will improve Outcome: Progressing   Problem: Safety: Goal: Ability to remain free from injury will improve Outcome: Progressing   Problem: Skin Integrity: Goal: Risk for impaired skin integrity will decrease Outcome: Progressing
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
## 2020-10-26 NOTE — Progress Notes (Signed)
Pharmacy Antibiotic Note  Caitlin Harrington is a 74 y.o. female admitted on 10/25/2020 with sepsis.  Pharmacy has been consulted for Cefepime and Vancomycin dosing.   Height: 5' (152.4 cm) Weight: 84 kg (185 lb 3 oz) IBW/kg (Calculated) : 45.5  Temp (24hrs), Avg:98.5 F (36.9 C), Min:98 F (36.7 C), Max:99 F (37.2 C)  Recent Labs  Lab 10/25/20 2346 10/26/20 0344  WBC 15.7*  --   CREATININE 1.08*  --   LATICACIDVEN  --  2.6*    Estimated Creatinine Clearance: 45.3 mL/min (A) (by C-G formula based on SCr of 1.08 mg/dL (H)).    Allergies  Allergen Reactions  . Motrin [Ibuprofen] Hives  . Atorvastatin Hives and Rash    chills  . Meloxicam Hives and Rash  . Statins Hives and Rash    Antimicrobials this admission: 12/4 Cefepime >>  12/4 Vancomycin >>   Dose adjustments this admission: N/a  Microbiology results: Pending   Plan:  - Cefepime 2g IV q12h - Vancomycin 1750mg  IV x 1 dose  - Followed by Vancomycin 1000mg  IV q24h - Goal trough ~15 - Monitor patients renal function and urine output  - De-escalate ABX when appropriate   Thank you for allowing pharmacy to be a part of this patient's care.  PharmD. BCPS 10/26/2020 10:22 AM

## 2020-10-27 ENCOUNTER — Inpatient Hospital Stay (HOSPITAL_COMMUNITY): Payer: Medicare Other

## 2020-10-27 DIAGNOSIS — N179 Acute kidney failure, unspecified: Secondary | ICD-10-CM

## 2020-10-27 DIAGNOSIS — F331 Major depressive disorder, recurrent, moderate: Secondary | ICD-10-CM

## 2020-10-27 LAB — CBC
HCT: 40.6 % (ref 36.0–46.0)
Hemoglobin: 14.1 g/dL (ref 12.0–15.0)
MCH: 28.5 pg (ref 26.0–34.0)
MCHC: 34.7 g/dL (ref 30.0–36.0)
MCV: 82.2 fL (ref 80.0–100.0)
Platelets: 287 10*3/uL (ref 150–400)
RBC: 4.94 MIL/uL (ref 3.87–5.11)
RDW: 12.8 % (ref 11.5–15.5)
WBC: 13 10*3/uL — ABNORMAL HIGH (ref 4.0–10.5)
nRBC: 0 % (ref 0.0–0.2)

## 2020-10-27 LAB — BASIC METABOLIC PANEL
Anion gap: 11 (ref 5–15)
BUN: 16 mg/dL (ref 8–23)
CO2: 23 mmol/L (ref 22–32)
Calcium: 9 mg/dL (ref 8.9–10.3)
Chloride: 101 mmol/L (ref 98–111)
Creatinine, Ser: 0.78 mg/dL (ref 0.44–1.00)
GFR, Estimated: >60 mL/min (ref >60)
Glucose, Bld: 146 mg/dL — ABNORMAL HIGH (ref 70–99)
Potassium: 2.9 mmol/L — ABNORMAL LOW (ref 3.5–5.1)
Sodium: 135 mmol/L (ref 135–145)

## 2020-10-27 LAB — GLUCOSE, CAPILLARY
Glucose-Capillary: 196 mg/dL — ABNORMAL HIGH (ref 70–99)
Glucose-Capillary: 192 mg/dL — ABNORMAL HIGH (ref 70–99)
Glucose-Capillary: 165 mg/dL — ABNORMAL HIGH (ref 70–99)
Glucose-Capillary: 136 mg/dL — ABNORMAL HIGH (ref 70–99)

## 2020-10-27 LAB — URINE CULTURE: Culture: NO GROWTH

## 2020-10-27 LAB — TSH: TSH: 4.247 u[IU]/mL (ref 0.350–4.500)

## 2020-10-27 LAB — FOLATE: Folate: 10.6 ng/mL (ref >5.9)

## 2020-10-27 LAB — MAGNESIUM: Magnesium: 1.8 mg/dL (ref 1.7–2.4)

## 2020-10-27 LAB — VITAMIN B12: Vitamin B-12: 195 pg/mL (ref 180–914)

## 2020-10-27 MED ORDER — CEPHALEXIN 500 MG PO CAPS
500.0000 mg | ORAL_CAPSULE | Freq: Two times a day (BID) | ORAL | Status: AC
  Administered 2020-10-27 – 2020-10-29 (×4): 500 mg via ORAL
  Filled 2020-10-27 (×4): qty 1

## 2020-10-27 MED ORDER — POTASSIUM CHLORIDE CRYS ER 20 MEQ PO TBCR
40.0000 meq | EXTENDED_RELEASE_TABLET | Freq: Two times a day (BID) | ORAL | Status: AC
  Administered 2020-10-27 (×2): 40 meq via ORAL
  Filled 2020-10-27 (×2): qty 2

## 2020-10-27 MED ORDER — VANCOMYCIN HCL IN DEXTROSE 1-5 GM/200ML-% IV SOLN
1000.0000 mg | Freq: Two times a day (BID) | INTRAVENOUS | Status: DC
  Administered 2020-10-27: 1000 mg via INTRAVENOUS
  Filled 2020-10-27: qty 200

## 2020-10-27 MED ORDER — FLUOXETINE HCL 10 MG PO CAPS
10.0000 mg | ORAL_CAPSULE | Freq: Every day | ORAL | Status: DC
  Administered 2020-10-28 – 2020-10-30 (×3): 10 mg via ORAL
  Filled 2020-10-27 (×4): qty 1

## 2020-10-27 MED ORDER — HYDRALAZINE HCL 25 MG PO TABS
25.0000 mg | ORAL_TABLET | Freq: Three times a day (TID) | ORAL | Status: DC | PRN
  Administered 2020-10-28 (×2): 25 mg via ORAL
  Filled 2020-10-27 (×2): qty 1

## 2020-10-27 MED ORDER — SODIUM CHLORIDE 0.9 % IV SOLN
2.0000 g | Freq: Three times a day (TID) | INTRAVENOUS | Status: DC
  Administered 2020-10-27: 2 g via INTRAVENOUS
  Filled 2020-10-27: qty 2

## 2020-10-27 MED ORDER — VITAMIN B-12 1000 MCG PO TABS
500.0000 ug | ORAL_TABLET | Freq: Every day | ORAL | Status: DC
  Administered 2020-10-27 – 2020-10-30 (×4): 500 ug via ORAL
  Filled 2020-10-27 (×5): qty 1

## 2020-10-27 MED ORDER — CARVEDILOL 12.5 MG PO TABS
12.5000 mg | ORAL_TABLET | Freq: Two times a day (BID) | ORAL | Status: DC
  Administered 2020-10-27 – 2020-10-30 (×7): 12.5 mg via ORAL
  Filled 2020-10-27 (×8): qty 1

## 2020-10-27 NOTE — Progress Notes (Signed)
Spoke with son over phone to do consents for MRI.  Son would like for his mother's Doctor to call him with the results of the MRI.  He stated someone was supposed to call him yesterday but he has not spoken to anyone.

## 2020-10-27 NOTE — Plan of Care (Signed)
  Problem: Clinical Measurements: Goal: Ability to maintain clinical measurements within normal limits will improve Outcome: Progressing Goal: Will remain free from infection Outcome: Progressing   Problem: Nutrition: Goal: Adequate nutrition will be maintained Outcome: Progressing   Problem: Elimination: Goal: Will not experience complications related to urinary retention Outcome: Progressing   Problem: Pain Managment: Goal: General experience of comfort will improve Outcome: Progressing   Problem: Safety: Goal: Ability to remain free from injury will improve Outcome: Progressing   Problem: Skin Integrity: Goal: Risk for impaired skin integrity will decrease Outcome: Progressing

## 2020-10-27 NOTE — Progress Notes (Signed)
Pharmacy Antibiotic Note  Caitlin Harrington is a 74 y.o. female admitted on 10/25/2020 with AMS.  Pharmacy has been consulted for Cefepime and Vancomycin dosing for sepsis source unknown. Patient started on cefepime 2g IV q12h and vancomycin 1000mg  IV q24h (maintenance regimen to begin today at 1200). Scr improved to 0.78 with current CrCl of 61 ml/min. UOP 0.7 ml/kg/hr. WBC 13.0. Afebrile.    Height: 5' (152.4 cm) Weight: 83.8 kg (184 lb 11.9 oz) IBW/kg (Calculated) : 45.5  Temp (24hrs), Avg:98 F (36.7 C), Min:97.6 F (36.4 C), Max:98.4 F (36.9 C)  Recent Labs  Lab 10/25/20 2346 10/26/20 0344 10/26/20 1518 10/27/20 0252  WBC 15.7*  --   --  13.0*  CREATININE 1.08*  --   --  0.78  LATICACIDVEN  --  2.6* 1.7  --     Estimated Creatinine Clearance: 61 mL/min (by C-G formula based on SCr of 0.78 mg/dL).    Allergies  Allergen Reactions  . Motrin [Ibuprofen] Hives  . Atorvastatin Hives and Rash    chills  . Meloxicam Hives and Rash  . Statins Hives and Rash    Antimicrobials this admission: 12/4 Cefepime >>  12/4 Vancomycin >>   Dose adjustments this admission: 12/5: adjust maintenance regimen of vancomycin (not yet administered) from 1000mg  q24h to 1000mg  q12h  12/5: adjust cefepime from 2g q12h to 2g q8h based on improvement in renal function  Microbiology results: 12/4 bcx: ngtd    Plan:  Adjust cefepime to 2g IV q8h  Adjust vancomycin to 1000mg  q12h Monitor renal function, cultures, possible source identification, and clinical progression  Thank you for allowing pharmacy to be a part of this patient's care.  , PharmD Clinical Pharmacist  10/27/2020 8:47 AM

## 2020-10-27 NOTE — Progress Notes (Signed)
Pt's blood pressure had been running 180's/80. Hydralazine ordered and given. Last BP check was 175/98. Will monitor pt.
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
## 2020-10-28 ENCOUNTER — Inpatient Hospital Stay (HOSPITAL_COMMUNITY): Payer: Medicare Other

## 2020-10-28 DIAGNOSIS — R4182 Altered mental status, unspecified: Secondary | ICD-10-CM

## 2020-10-28 DIAGNOSIS — I6389 Other cerebral infarction: Secondary | ICD-10-CM | POA: Diagnosis not present

## 2020-10-28 LAB — CBC
HCT: 39.6 % (ref 36.0–46.0)
Hemoglobin: 13.6 g/dL (ref 12.0–15.0)
MCH: 28.7 pg (ref 26.0–34.0)
MCHC: 34.3 g/dL (ref 30.0–36.0)
MCV: 83.5 fL (ref 80.0–100.0)
Platelets: 236 10*3/uL (ref 150–400)
RBC: 4.74 MIL/uL (ref 3.87–5.11)
RDW: 12.7 % (ref 11.5–15.5)
WBC: 10.9 10*3/uL — ABNORMAL HIGH (ref 4.0–10.5)
nRBC: 0 % (ref 0.0–0.2)

## 2020-10-28 LAB — ECHOCARDIOGRAM COMPLETE
Area-P 1/2: 2.48 cm2
Height: 60 in
S' Lateral: 2.1 cm
Weight: 2955.93 oz

## 2020-10-28 LAB — BASIC METABOLIC PANEL
Anion gap: 8 (ref 5–15)
BUN: 14 mg/dL (ref 8–23)
CO2: 24 mmol/L (ref 22–32)
Calcium: 9 mg/dL (ref 8.9–10.3)
Chloride: 103 mmol/L (ref 98–111)
Creatinine, Ser: 0.84 mg/dL (ref 0.44–1.00)
GFR, Estimated: >60 mL/min (ref >60)
Glucose, Bld: 126 mg/dL — ABNORMAL HIGH (ref 70–99)
Potassium: 3.9 mmol/L (ref 3.5–5.1)
Sodium: 135 mmol/L (ref 135–145)

## 2020-10-28 LAB — GLUCOSE, CAPILLARY
Glucose-Capillary: 139 mg/dL — ABNORMAL HIGH (ref 70–99)
Glucose-Capillary: 148 mg/dL — ABNORMAL HIGH (ref 70–99)
Glucose-Capillary: 164 mg/dL — ABNORMAL HIGH (ref 70–99)
Glucose-Capillary: 132 mg/dL — ABNORMAL HIGH (ref 70–99)

## 2020-10-28 MED ORDER — ADULT MULTIVITAMIN W/MINERALS CH
1.0000 | ORAL_TABLET | Freq: Every day | ORAL | Status: DC
  Administered 2020-10-28 – 2020-10-30 (×3): 1 via ORAL
  Filled 2020-10-28 (×4): qty 1

## 2020-10-28 MED ORDER — GLUCERNA SHAKE PO LIQD
237.0000 mL | Freq: Three times a day (TID) | ORAL | Status: DC
  Administered 2020-10-28 – 2020-10-29 (×3): 237 mL via ORAL

## 2020-10-28 MED ORDER — ASPIRIN EC 81 MG PO TBEC
81.0000 mg | DELAYED_RELEASE_TABLET | Freq: Every day | ORAL | Status: DC
  Administered 2020-10-28 – 2020-10-30 (×3): 81 mg via ORAL
  Filled 2020-10-28 (×4): qty 1

## 2020-10-28 MED ORDER — PERFLUTREN LIPID MICROSPHERE
1.0000 mL | INTRAVENOUS | Status: AC | PRN
  Administered 2020-10-28: 2 mL via INTRAVENOUS
  Filled 2020-10-28: qty 10
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
## 2020-10-28 NOTE — Evaluation (Signed)
Clinical/Bedside Swallow Evaluation Patient Details  Name: Caitlin Harrington MRN: [id number] Date of Birth: 1946-04-12  Today's Date: 10/28/2020 Time: SLP Start Time (ACUTE ONLY): 1055 SLP Stop Time (ACUTE ONLY): 1108 SLP Time Calculation (min) (ACUTE ONLY): 13 min  Past Medical History:  Past Medical History:  Diagnosis Date  . Asthma   . COVID-19   . Depression   . Diabetes mellitus without complication (HCC)   . Fatigue   . Gout   . Hypertension   . Insomnia   . Numbness   . Obesity   . Paresthesia   . Vitamin D deficiency    Past Surgical History:  Past Surgical History:  Procedure Laterality Date  . ABDOMINAL HYSTERECTOMY     HPI:  Caitlin Harrington is a 74 y.o. female with medical history significant of HTN; DM; and depression presenting with AMS. MRI reported "Patchy early subacute cortically based infarcts within the left parietoccipital lobes" and "Chronic hemorrhagic small vessel infarcts within the bilateral basal ganglia."   Assessment / Plan / Recommendation Clinical Impression  Pt was seen for a bedside swallow evaluation and she presents with suspected functional oropharyngeal swallowing abilities.  Pt was encountered awake/alert with son present at bedside.  Pt was cooperative, but appeared intermittently confused throughout this evaluation. Oral mechanism exam was unremarkable.  Pt consumed trials of thin liquid and regular solids.  Pt fed herself independently and she demonstrated good bolus acceptance, timely mastication, suspected timely AP transport/swallow initiation, and consistent hyolaryngeal elevation/excursion to observation and palpation.  No overt s/sx of aspiration were observed with any trials.  Recommend continuation of regular solids and thin liquids with medications administered whole with liquid.  No further skilled ST is warranted at this time.  Please re-consult if additional needs arise.    SLP Visit Diagnosis: Dysphagia, unspecified (R13.10)     Aspiration Risk  No limitations    Diet Recommendation Regular;Thin liquid   Liquid Administration via: Cup;Straw Supervision: Patient able to self feed Compensations: Minimize environmental distractions;Slow rate;Small sips/bites Postural Changes: Seated upright at 90 degrees    Other  Recommendations Oral Care Recommendations: Oral care BID   Follow up Recommendations None        Swallow Study   General HPI: Caitlin Harrington is a 74 y.o. female with medical history significant of HTN; DM; and depression presenting with AMS. MRI reported "Patchy early subacute cortically based infarcts within the left parietooccipital lobes" and "Chronic hemorrhagic small vessel infarcts within the bilateral basal ganglia." Type of Study: Bedside Swallow Evaluation Previous Swallow Assessment: None  Diet Prior to this Study: Regular;Thin liquids Temperature Spikes Noted: No Respiratory Status: Room air History of Recent Intubation: No Behavior/Cognition: Alert;Cooperative;Pleasant mood;Confused Oral Cavity Assessment: Within Functional Limits Oral Care Completed by SLP: No Oral Cavity - Dentition: Adequate natural dentition Vision: Functional for self-feeding Self-Feeding Abilities: Able to feed self Patient Positioning: Upright in bed Baseline Vocal Quality: Normal Volitional Swallow: Able to elicit    Oral/Motor/Sensory Function Overall Oral Motor/Sensory Function: Within functional limits   Ice Chips Ice chips: Not tested   Thin Liquid Thin Liquid: Within functional limits    Nectar Thick Nectar Thick Liquid: Not tested   Honey Thick Honey Thick Liquid: Not tested   Puree Puree: Not tested   Solid     Solid: Within functional limits Presentation: Self Fed     Villa Herb., M.S., CCC-SLP Acute Rehabilitation Services Office: [phone]  Shanon Rosser Jewel Mcafee 10/28/2020,11:15 AM
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
## 2020-10-28 NOTE — Progress Notes (Signed)
Initial Nutrition Assessment  DOCUMENTATION CODES:   Obesity unspecified  INTERVENTION:   -Glucerna Shake po TID, each supplement provides 220 kcal and 10 grams of protein -MVI with minerals daily  NUTRITION DIAGNOSIS:   Inadequate oral intake related to decreased appetite as evidenced by meal completion < 50%, per patient/family report.  GOAL:   Patient will meet greater than or equal to 90% of their needs  MONITOR:   PO intake, Supplement acceptance, Labs, Weight trends, Skin, I & O's  REASON FOR ASSESSMENT:   Malnutrition Screening Tool    ASSESSMENT:   Caitlin Harrington is a 74 y.o. female with medical history significant of HTN; DM; and depression presenting with AMS.  Pt admitted with sepsis, AKI, and encephalopathy.   12/6- s/p BSE- advanced to regular diet with thin liquids  Spoke with pt, who was sitting in recliner chair at time of visit. Pt with very flat affect. She consumed about 50% of her lunch. She shares that she has been consuming about half of her meals since being admitted. Pt reports PTA she had a good appetite and consumed 3 meals per day, but was unable to provide details regarding diet history. She denies any difficulty chewing or swallowing.   Per pt, she denies weight loss. Reviewed wt hx; wt has been stable over the past year.   Discussed importance of good meal and supplement intake to promote healing. She is amenable to Glucerna supplements.   Medications reviewed and include vitamin B-12.   Lab Results  Component Value Date   HGBA1C 10.0 (H) 10/26/2020   PTA DM medications are 15 units insulin glargine BID and 5 mg linagliptin daily.   Labs reviewed: CBGS: 148 (inpatient orders for glycemic control are 0-15 units insulin aspart TID with meals,0-5 units insulin aspart daily at bedtime, and 15 units insulin glargine BID).   NUTRITION - FOCUSED PHYSICAL EXAM:    Most Recent Value  Orbital Region No depletion  Upper Arm Region No  depletion  Thoracic and Lumbar Region No depletion  Buccal Region No depletion  Temple Region No depletion  Clavicle Bone Region No depletion  Clavicle and Acromion Bone Region No depletion  Scapular Bone Region No depletion  Dorsal Hand No depletion  Patellar Region No depletion  Anterior Thigh Region No depletion  Posterior Calf Region No depletion  Edema (RD Assessment) None  Hair Reviewed  Eyes Reviewed  Mouth Reviewed  Skin Reviewed  Nails Reviewed       Diet Order:   Diet Order            Diet Carb Modified Fluid consistency: Thin; Room service appropriate? Yes  Diet effective now                 EDUCATION NEEDS:   Education needs have been addressed  Skin:  Skin Assessment: Reviewed RN Assessment  Last BM:  10/28/20  Height:   Ht Readings from Last 1 Encounters:  10/26/20 5' (1.524 m)    Weight:   Wt Readings from Last 1 Encounters:  10/26/20 83.8 kg    Ideal Body Weight:  45.5 kg  BMI:  Body mass index is 36.08 kg/m.  Estimated Nutritional Needs:   Kcal:  1850-2050  Protein:  90-105 grams  Fluid:  > 1.8 L    Levada Schilling, RD, LDN, CDCES Registered Dietitian II Certified Diabetes Care and Education Specialist Please refer to [facility] for RD and/or RD on-call/weekend/after hours pager

## 2020-10-28 NOTE — Plan of Care (Signed)
  Problem: Clinical Measurements: Goal: Signs and symptoms of infection will decrease Outcome: Progressing   Problem: Respiratory: Goal: Ability to maintain adequate ventilation will improve Outcome: Progressing   Problem: Education: Goal: Knowledge of General Education information will improve Description: Including pain rating scale, medication(s)/side effects and non-pharmacologic comfort measures Outcome: Progressing   Problem: Health Behavior/Discharge Planning: Goal: Ability to manage health-related needs will improve Outcome: Progressing   Problem: Clinical Measurements: Goal: Ability to maintain clinical measurements within normal limits will improve Outcome: Progressing Goal: Will remain free from infection Outcome: Progressing Goal: Diagnostic test results will improve Outcome: Progressing Goal: Respiratory complications will improve Outcome: Progressing   Problem: Nutrition: Goal: Adequate nutrition will be maintained Outcome: Progressing   Problem: Elimination: Goal: Will not experience complications related to bowel motility Outcome: Progressing Goal: Will not experience complications related to urinary retention Outcome: Progressing   Problem: Pain Managment: Goal: General experience of comfort will improve Outcome: Progressing   Problem: Safety: Goal: Ability to remain free from injury will improve Outcome: Progressing   Problem: Skin Integrity: Goal: Risk for impaired skin integrity will decrease Outcome: Progressing

## 2020-10-28 NOTE — Progress Notes (Signed)
  Echocardiogram 2D Echocardiogram has been performed.  Pieter Partridge 10/28/2020, 10:03 AM

## 2020-10-28 NOTE — Plan of Care (Signed)
  Problem: Clinical Measurements: Goal: Diagnostic test results will improve Outcome: Progressing   

## 2020-10-28 NOTE — Progress Notes (Signed)
VASCULAR LAB    Carotid duplex has been performed.  See CV proc for preliminary results.   Jacque Garrels, RVT 10/28/2020, 9:37 AM

## 2020-10-29 ENCOUNTER — Inpatient Hospital Stay (HOSPITAL_COMMUNITY): Payer: Medicare Other

## 2020-10-29 DIAGNOSIS — E119 Type 2 diabetes mellitus without complications: Secondary | ICD-10-CM

## 2020-10-29 DIAGNOSIS — I6330 Cerebral infarction due to thrombosis of unspecified cerebral artery: Secondary | ICD-10-CM | POA: Insufficient documentation

## 2020-10-29 LAB — GLUCOSE, CAPILLARY
Glucose-Capillary: 162 mg/dL — ABNORMAL HIGH (ref 70–99)
Glucose-Capillary: 263 mg/dL — ABNORMAL HIGH (ref 70–99)
Glucose-Capillary: 141 mg/dL — ABNORMAL HIGH (ref 70–99)
Glucose-Capillary: 126 mg/dL — ABNORMAL HIGH (ref 70–99)

## 2020-10-29 LAB — LIPID PANEL
Cholesterol: 165 mg/dL (ref 0–200)
HDL: 33 mg/dL — ABNORMAL LOW (ref >40)
LDL Cholesterol: 119 mg/dL — ABNORMAL HIGH (ref 0–99)
Total CHOL/HDL Ratio: 5 RATIO
Triglycerides: 66 mg/dL (ref <150)
VLDL: 13 mg/dL (ref 0–40)

## 2020-10-29 MED ORDER — CLOPIDOGREL BISULFATE 75 MG PO TABS
75.0000 mg | ORAL_TABLET | Freq: Every day | ORAL | Status: DC
  Administered 2020-10-29 – 2020-10-30 (×2): 75 mg via ORAL
  Filled 2020-10-29 (×3): qty 1

## 2020-10-29 MED ORDER — LISINOPRIL 10 MG PO TABS
10.0000 mg | ORAL_TABLET | Freq: Every day | ORAL | Status: DC
  Administered 2020-10-29 – 2020-10-30 (×2): 10 mg via ORAL
  Filled 2020-10-29 (×3): qty 1

## 2020-10-29 MED ORDER — HYDRALAZINE HCL 25 MG PO TABS: 25.0000 mg | ORAL_TABLET | Freq: Three times a day (TID) | ORAL | Status: DC | PRN

## 2020-10-29 MED ORDER — PRAVASTATIN SODIUM 10 MG PO TABS
20.0000 mg | ORAL_TABLET | Freq: Every day | ORAL | Status: DC
  Administered 2020-10-29 – 2020-10-30 (×2): 20 mg via ORAL
  Filled 2020-10-29 (×2): qty 2

## 2020-10-29 MED ORDER — HYDRALAZINE HCL 50 MG PO TABS: 50.0000 mg | ORAL_TABLET | Freq: Three times a day (TID) | ORAL | Status: DC | PRN

## 2020-10-29 MED ORDER — IOHEXOL 350 MG/ML SOLN
80.0000 mL | Freq: Once | INTRAVENOUS | Status: AC | PRN
  Administered 2020-10-29: 80 mL via INTRAVENOUS
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
## 2020-10-29 NOTE — Care Management Important Message (Signed)
Important Message  Patient Details  Name: Caitlin Harrington MRN: [id number] Date of Birth: 04-27-1946   Medicare Important Message Given:  Yes     Dorena Bodo 10/29/2020, 3:30 PM
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
## 2020-10-29 NOTE — Progress Notes (Signed)
New IV placed by IV team in L FA #20, called down to CT and made them aware of placement.  Patient is in bed resting and has no complaints of pain at the moment and no signs of distress noted.  Dr. Georgiana Spinner did come in a assess patient and did tell patient about the importance of following her medication regimen, patient did state that she hasn't been taking her medications as of lately because they make her feel so tired. Importance medication was acknowledged and was made aware that patient did in fact have a stroke.
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
## 2020-10-29 NOTE — Plan of Care (Signed)
  Problem: Fluid Volume: Goal: Hemodynamic stability will improve Outcome: Progressing   Problem: Clinical Measurements: Goal: Diagnostic test results will improve Outcome: Progressing Goal: Signs and symptoms of infection will decrease Outcome: Progressing   Problem: Respiratory: Goal: Ability to maintain adequate ventilation will improve Outcome: Progressing   Problem: Education: Goal: Knowledge of General Education information will improve Description: Including pain rating scale, medication(s)/side effects and non-pharmacologic comfort measures Outcome: Progressing   Problem: Health Behavior/Discharge Planning: Goal: Ability to manage health-related needs will improve Outcome: Progressing   Problem: Clinical Measurements: Goal: Ability to maintain clinical measurements within normal limits will improve Outcome: Progressing Goal: Will remain free from infection Outcome: Progressing Goal: Diagnostic test results will improve Outcome: Progressing Goal: Respiratory complications will improve Outcome: Progressing Goal: Cardiovascular complication will be avoided Outcome: Progressing   Problem: Activity: Goal: Risk for activity intolerance will decrease Outcome: Progressing   Problem: Nutrition: Goal: Adequate nutrition will be maintained Outcome: Progressing   Problem: Coping: Goal: Level of anxiety will decrease Outcome: Progressing   Problem: Elimination: Goal: Will not experience complications related to bowel motility Outcome: Progressing Goal: Will not experience complications related to urinary retention Outcome: Progressing   Problem: Pain Managment: Goal: General experience of comfort will improve Outcome: Progressing   Problem: Safety: Goal: Ability to remain free from injury will improve Outcome: Progressing   Problem: Skin Integrity: Goal: Risk for impaired skin integrity will decrease Outcome: Progressing   Problem: Education: Goal:  Knowledge of disease or condition will improve Outcome: Progressing Goal: Knowledge of secondary prevention will improve Outcome: Progressing Goal: Knowledge of patient specific risk factors addressed and post discharge goals established will improve Outcome: Progressing   Problem: Coping: Goal: Will verbalize positive feelings about self Outcome: Progressing Goal: Will identify appropriate support needs Outcome: Progressing   Problem: Health Behavior/Discharge Planning: Goal: Ability to manage health-related needs will improve Outcome: Progressing   Problem: Self-Care: Goal: Ability to participate in self-care as condition permits will improve Outcome: Progressing Goal: Verbalization of feelings and concerns over difficulty with self-care will improve Outcome: Progressing Goal: Ability to communicate needs accurately will improve Outcome: Progressing   Problem: Nutrition: Goal: Risk of aspiration will decrease Outcome: Progressing Goal: Dietary intake will improve Outcome: Progressing

## 2020-10-30 LAB — GLUCOSE, CAPILLARY
Glucose-Capillary: 93 mg/dL (ref 70–99)
Glucose-Capillary: 153 mg/dL — ABNORMAL HIGH (ref 70–99)
Glucose-Capillary: 122 mg/dL — ABNORMAL HIGH (ref 70–99)
Glucose-Capillary: 244 mg/dL — ABNORMAL HIGH (ref 70–99)

## 2020-10-30 MED ORDER — INSULIN PEN NEEDLE 30G X 8 MM MISC: 1.0000 | 0 refills | Status: DC

## 2020-10-30 MED ORDER — CLOPIDOGREL BISULFATE 75 MG PO TABS: 75.0000 mg | ORAL_TABLET | Freq: Every day | ORAL | 0 refills | Status: AC

## 2020-10-30 MED ORDER — ASPIRIN 81 MG PO TBEC
81.0000 mg | DELAYED_RELEASE_TABLET | Freq: Every day | ORAL | 11 refills | Status: DC
Start: 2020-10-31 — End: 2023-11-12

## 2020-10-30 MED ORDER — CARVEDILOL 6.25 MG PO TABS: 6.2500 mg | ORAL_TABLET | Freq: Two times a day (BID) | ORAL | 1 refills | Status: DC

## 2020-10-30 MED ORDER — LISINOPRIL 20 MG PO TABS: 20.0000 mg | ORAL_TABLET | Freq: Every day | ORAL | 1 refills | Status: DC

## 2020-10-30 MED ORDER — INSULIN GLARGINE 100 UNIT/ML SOLOSTAR PEN: 15.0000 [IU] | PEN_INJECTOR | Freq: Two times a day (BID) | SUBCUTANEOUS | 0 refills | Status: DC

## 2020-10-30 MED ORDER — FLUOXETINE HCL 10 MG PO CAPS: 10.0000 mg | ORAL_CAPSULE | Freq: Every day | ORAL | 3 refills | Status: DC

## 2020-10-30 MED ORDER — CYANOCOBALAMIN 500 MCG PO TABS
500.0000 ug | ORAL_TABLET | Freq: Every day | ORAL | 2 refills | Status: DC
Start: 2020-10-31 — End: 2021-08-25

## 2020-10-30 NOTE — Plan of Care (Signed)
Problem: Fluid Volume: Goal: Hemodynamic stability will improve 10/30/2020 1143 by Sandford Craze, RN Outcome: Adequate for Discharge 10/30/2020 0730 by Sandford Craze, RN Outcome: Progressing   Problem: Clinical Measurements: Goal: Diagnostic test results will improve 10/30/2020 1143 by Sandford Craze, RN Outcome: Adequate for Discharge 10/30/2020 0730 by Sandford Craze, RN Outcome: Progressing Goal: Signs and symptoms of infection will decrease 10/30/2020 1143 by Sandford Craze, RN Outcome: Adequate for Discharge 10/30/2020 0730 by Sandford Craze, RN Outcome: Progressing   Problem: Respiratory: Goal: Ability to maintain adequate ventilation will improve 10/30/2020 1143 by Sandford Craze, RN Outcome: Adequate for Discharge 10/30/2020 0730 by Sandford Craze, RN Outcome: Progressing   Problem: Education: Goal: Knowledge of General Education information will improve Description: Including pain rating scale, medication(s)/side effects and non-pharmacologic comfort measures 10/30/2020 1143 by Sandford Craze, RN Outcome: Adequate for Discharge 10/30/2020 0730 by Sandford Craze, RN Outcome: Progressing   Problem: Health Behavior/Discharge Planning: Goal: Ability to manage health-related needs will improve 10/30/2020 1143 by Sandford Craze, RN Outcome: Adequate for Discharge 10/30/2020 0730 by Sandford Craze, RN Outcome: Progressing   Problem: Clinical Measurements: Goal: Ability to maintain clinical measurements within normal limits will improve 10/30/2020 1143 by Sandford Craze, RN Outcome: Adequate for Discharge 10/30/2020 0730 by Sandford Craze, RN Outcome: Progressing Goal: Will remain free from infection 10/30/2020 1143 by Sandford Craze, RN Outcome: Adequate for Discharge 10/30/2020 0730 by Sandford Craze, RN Outcome: Progressing Goal: Diagnostic test results will improve 10/30/2020 1143 by Sandford Craze, RN Outcome: Adequate for Discharge 10/30/2020 0730 by  Sandford Craze, RN Outcome: Progressing Goal: Respiratory complications will improve 10/30/2020 1143 by Sandford Craze, RN Outcome: Adequate for Discharge 10/30/2020 0730 by Sandford Craze, RN Outcome: Progressing Goal: Cardiovascular complication will be avoided 10/30/2020 1143 by Sandford Craze, RN Outcome: Adequate for Discharge 10/30/2020 0730 by Sandford Craze, RN Outcome: Progressing   Problem: Activity: Goal: Risk for activity intolerance will decrease 10/30/2020 1143 by Sandford Craze, RN Outcome: Adequate for Discharge 10/30/2020 0730 by Sandford Craze, RN Outcome: Progressing   Problem: Nutrition: Goal: Adequate nutrition will be maintained 10/30/2020 1143 by Sandford Craze, RN Outcome: Adequate for Discharge 10/30/2020 0730 by Sandford Craze, RN Outcome: Progressing   Problem: Coping: Goal: Level of anxiety will decrease 10/30/2020 1143 by Sandford Craze, RN Outcome: Adequate for Discharge 10/30/2020 0730 by Sandford Craze, RN Outcome: Progressing   Problem: Elimination: Goal: Will not experience complications related to bowel motility 10/30/2020 1143 by Sandford Craze, RN Outcome: Adequate for Discharge 10/30/2020 0730 by Sandford Craze, RN Outcome: Progressing Goal: Will not experience complications related to urinary retention 10/30/2020 1143 by Sandford Craze, RN Outcome: Adequate for Discharge 10/30/2020 0730 by Sandford Craze, RN Outcome: Progressing   Problem: Pain Managment: Goal: General experience of comfort will improve 10/30/2020 1143 by Sandford Craze, RN Outcome: Adequate for Discharge 10/30/2020 0730 by Sandford Craze, RN Outcome: Progressing   Problem: Safety: Goal: Ability to remain free from injury will improve 10/30/2020 1143 by Sandford Craze, RN Outcome: Adequate for Discharge 10/30/2020 0730 by Sandford Craze, RN Outcome: Progressing   Problem: Skin Integrity: Goal: Risk for impaired skin integrity will decrease 10/30/2020  1143 by Sandford Craze, RN Outcome: Adequate for Discharge 10/30/2020 0730 by Sandford Craze, RN Outcome: Progressing   Problem: Education: Goal: Knowledge of disease or condition will improve  10/30/2020 1143 by Sandford Craze, RN Outcome: Adequate for Discharge 10/30/2020 0730 by Sandford Craze, RN Outcome: Progressing Goal: Knowledge of secondary prevention will improve 10/30/2020 1143 by Sandford Craze, RN Outcome: Adequate for Discharge 10/30/2020 0730 by Sandford Craze, RN Outcome: Progressing Goal: Knowledge of patient specific risk factors addressed and post discharge goals established will improve 10/30/2020 1143 by Sandford Craze, RN Outcome: Adequate for Discharge 10/30/2020 0730 by Sandford Craze, RN Outcome: Progressing   Problem: Coping: Goal: Will verbalize positive feelings about self 10/30/2020 1143 by Sandford Craze, RN Outcome: Adequate for Discharge 10/30/2020 0730 by Sandford Craze, RN Outcome: Progressing Goal: Will identify appropriate support needs 10/30/2020 1143 by Sandford Craze, RN Outcome: Adequate for Discharge 10/30/2020 0730 by Sandford Craze, RN Outcome: Progressing   Problem: Health Behavior/Discharge Planning: Goal: Ability to manage health-related needs will improve 10/30/2020 1143 by Sandford Craze, RN Outcome: Adequate for Discharge 10/30/2020 0730 by Sandford Craze, RN Outcome: Progressing   Problem: Self-Care: Goal: Ability to participate in self-care as condition permits will improve 10/30/2020 1143 by Sandford Craze, RN Outcome: Adequate for Discharge 10/30/2020 0730 by Sandford Craze, RN Outcome: Progressing Goal: Verbalization of feelings and concerns over difficulty with self-care will improve 10/30/2020 1143 by Sandford Craze, RN Outcome: Adequate for Discharge 10/30/2020 0730 by Sandford Craze, RN Outcome: Progressing Goal: Ability to communicate needs accurately will improve 10/30/2020 1143 by Sandford Craze,  RN Outcome: Adequate for Discharge 10/30/2020 0730 by Sandford Craze, RN Outcome: Progressing   Problem: Nutrition: Goal: Risk of aspiration will decrease 10/30/2020 1143 by Sandford Craze, RN Outcome: Adequate for Discharge 10/30/2020 0730 by Sandford Craze, RN Outcome: Progressing Goal: Dietary intake will improve 10/30/2020 1143 by Sandford Craze, RN Outcome: Adequate for Discharge 10/30/2020 0730 by Sandford Craze, RN Outcome: Progressing

## 2020-10-30 NOTE — Plan of Care (Signed)
  Problem: Fluid Volume: Goal: Hemodynamic stability will improve Outcome: Progressing   Problem: Clinical Measurements: Goal: Diagnostic test results will improve Outcome: Progressing Goal: Signs and symptoms of infection will decrease Outcome: Progressing   Problem: Respiratory: Goal: Ability to maintain adequate ventilation will improve Outcome: Progressing   Problem: Education: Goal: Knowledge of General Education information will improve Description: Including pain rating scale, medication(s)/side effects and non-pharmacologic comfort measures Outcome: Progressing   Problem: Health Behavior/Discharge Planning: Goal: Ability to manage health-related needs will improve Outcome: Progressing   Problem: Clinical Measurements: Goal: Ability to maintain clinical measurements within normal limits will improve Outcome: Progressing Goal: Will remain free from infection Outcome: Progressing Goal: Diagnostic test results will improve Outcome: Progressing Goal: Respiratory complications will improve Outcome: Progressing Goal: Cardiovascular complication will be avoided Outcome: Progressing   Problem: Activity: Goal: Risk for activity intolerance will decrease Outcome: Progressing   Problem: Nutrition: Goal: Adequate nutrition will be maintained Outcome: Progressing   Problem: Coping: Goal: Level of anxiety will decrease Outcome: Progressing   Problem: Elimination: Goal: Will not experience complications related to bowel motility Outcome: Progressing Goal: Will not experience complications related to urinary retention Outcome: Progressing   Problem: Pain Managment: Goal: General experience of comfort will improve Outcome: Progressing   Problem: Safety: Goal: Ability to remain free from injury will improve Outcome: Progressing   Problem: Skin Integrity: Goal: Risk for impaired skin integrity will decrease Outcome: Progressing   Problem: Education: Goal:  Knowledge of disease or condition will improve Outcome: Progressing Goal: Knowledge of secondary prevention will improve Outcome: Progressing Goal: Knowledge of patient specific risk factors addressed and post discharge goals established will improve Outcome: Progressing   Problem: Coping: Goal: Will verbalize positive feelings about self Outcome: Progressing Goal: Will identify appropriate support needs Outcome: Progressing   Problem: Health Behavior/Discharge Planning: Goal: Ability to manage health-related needs will improve Outcome: Progressing   Problem: Self-Care: Goal: Ability to participate in self-care as condition permits will improve Outcome: Progressing Goal: Verbalization of feelings and concerns over difficulty with self-care will improve Outcome: Progressing Goal: Ability to communicate needs accurately will improve Outcome: Progressing   Problem: Nutrition: Goal: Risk of aspiration will decrease Outcome: Progressing Goal: Dietary intake will improve Outcome: Progressing   

## 2020-10-30 NOTE — Progress Notes (Signed)
Patient medicated accordingly (See MAR)  Patient asking about going home and stating that her son is going to come pick her up, made patient aware that she does not have orders to go home as of yet, if she wishes to leave AMA she will not receive any medication advice or prescriptions for home. Informed patient that its in her best interest to leave when she will be scheduled to leave due to her condition; educated patient on the importance of taking her medications, especially her blood pressure medications in order to keep her at a lower risk for another stroke. Assured patient she was lucky to no have severe deficits from her stroke and we want to prevent her from having another one which then we will not know what can come of that. Patient agreed and acknowledged that she understood the education and advice given to her by the nurse.
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
## 2020-10-30 NOTE — Progress Notes (Signed)
Received from [facility]  Patient alert and oriented x 2, disoriented to time and situation.  Patient sleeping upon entering the room, no significant events overnight reported by night nurse.  IV LFA flushed and saline locked.  Bedside table and call bell in reach for patient safety.

## 2020-10-30 NOTE — Consult Note (Signed)
   [facility] Emergency Room Inpatient Consult   10/30/2020  Caitlin Harrington May 27, 1946 [id number]   [place] Organization [ACO] Patient: Medicare NextGen  Patient with a history of [place] [facility] outreach for a diabetes follow up but was unable to reach.  Showing as a potental to transition from the hospital.  Reviewed to assess for post hospital care management needs for follow up benefits of the in network program. Chart review of inpatient Transition of Care [TOC] LCSW notes for transition of care needs reviewed for barriers to post hospital care.  Met with the patient at the bedside. Introduced self and [facility].  Patient slowly responds but is alert and oriented.  Spoke with patient regarding previous attempts for [facility] [facility] Coordinator to reach her.  Patient states, "yes, my voicemail stays full and if it's not caller ID and I don't recognize the number I don't answer or call back."  Patient states her son is Lu Duffel and he is her contact person. Primary Care Provider: Patient endorses Yaakov Guthrie, MD and this provider does the transition of care follow up.  Plan:  Follow up with inpatient [facility] team member regarding [place] [facility] eligibility and benefits noted. Continue to follow for needs.  Of note, [facility] Care Management services does not replace or interfere with any services arranged or needed by the inpatient [facility] staff.  For questions, please contact:  Natividad Brood, RN BSN [facility] Liaison  [phone] business mobile phone Toll free office [phone]  Fax number: [phone] [email or web address]

## 2020-10-30 NOTE — Progress Notes (Signed)
Spoke with patient's son and he stated he was not able to pick up the patient tonight, the patient contacted her son and she said that he was not going to come get her until someone called him to let him know that it was okay for her to go home. Dr. Neoma Laming spoke with him earlier and let him know that she was discharged today; when I called him he said he was not going to be able to pick her up. Chiropodist Alona Bene was made aware and she instructed to get in contact with MD, Dr. Rito Ehrlich was made aware and he said "I will call him. but she is discharged. the order wont be removed. Thanks."  Son Architectural technologist) said he won't be able to come pick her up until tomorrow morning.

## 2020-10-30 NOTE — Discharge Summary (Signed)
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 MV E velocity: 67.30 cm/s   Systemic Diam: 1.90 cm MV A velocity: 107.00 cm/s MV E/A ratio:  0.63 Eleonore Chiquito MD Electronically signed by Eleonore Chiquito MD Signature Date/Time: 10/28/2020/11:17:17 AM    Final    VAS US CAROTID  Result Date: 10/28/2020 Carotid Arterial Duplex Study Indications:       CVA and Altered mental status. Risk Factors:      Hypertension, hyperlipidemia, Diabetes. Limitations        Today's exam was limited due to the patient's inability or                    unwillingness to cooperate. Comparison Study:  No prior study on file Performing Technologist: Sharion Dove RVS  Examination Guidelines: A complete evaluation includes B-mode imaging, spectral Doppler, color Doppler, and power Doppler as needed of all accessible portions of each vessel. Bilateral testing is considered an integral part of a complete examination. Limited examinations for reoccurring  indications  may be performed as noted.  Right Carotid Findings: +----------+--------+--------+--------+------------------+--------+           PSV cm/sEDV cm/sStenosisPlaque DescriptionComments +----------+--------+--------+--------+------------------+--------+ CCA Prox  113     19                                         +----------+--------+--------+--------+------------------+--------+ CCA Distal78      11                                         +----------+--------+--------+--------+------------------+--------+ ICA Prox  75      21              homogeneous                +----------+--------+--------+--------+------------------+--------+ ICA Distal63      25                                         +----------+--------+--------+--------+------------------+--------+ ECA       86      9                                          +----------+--------+--------+--------+------------------+--------+ +----------+--------+-------+--------+-------------------+           PSV cm/sEDV cmsDescribeArm Pressure (mmHG) +----------+--------+-------+--------+-------------------+ Subclavian114                                        +----------+--------+-------+--------+-------------------+ +---------+--------+--+--------+--+ VertebralPSV cm/s46EDV cm/s16 +---------+--------+--+--------+--+  Left Carotid Findings: +----------+--------+--------+--------+------------------+--------+           PSV cm/sEDV cm/sStenosisPlaque DescriptionComments +----------+--------+--------+--------+------------------+--------+ CCA Prox  93      25                                         +----------+--------+--------+--------+------------------+--------+ CCA Distal80      29                                         +----------+--------+--------+--------+------------------+--------+ ICA Prox  78      33              homogeneous                 +----------+--------+--------+--------+------------------+--------+ ICA Distal71      27                                         +----------+--------+--------+--------+------------------+--------+ ECA       49      10                                         +----------+--------+--------+--------+------------------+--------+ +----------+--------+--------+------------+-------------------+  PSV cm/sEDV cm/sDescribe    Arm Pressure (mmHG) +----------+--------+--------+------------+-------------------+ Subclavian                Not assessed                    +----------+--------+--------+------------+-------------------+ +---------+--------+--+--------+--+ VertebralPSV cm/s54EDV cm/s19 +---------+--------+--+--------+--+   Summary: Right Carotid: The extracranial vessels were near-normal with only minimal wall                thickening or plaque. Left Carotid: The extracranial vessels were near-normal with only minimal wall               thickening or plaque. Vertebrals:  Bilateral vertebral arteries demonstrate antegrade flow. Subclavians: Normal flow hemodynamics were seen in the right subclavian artery.              Not assessed secondary to patient's constant movement. *See table(s) above for measurements and observations.  Electronically signed by Antony Contras MD on 10/28/2020 at 12:26:36 PM.    Final     DISCHARGE EXAMINATION: Vitals:   10/29/20 1721 10/29/20 2134 10/30/20 0533 10/30/20 0754  BP: (!) 161/99 127/84 (!) 149/81 (!) 156/80  Pulse: 66 62 (!) 53 (!) 58  Resp: 16 18 16 17   Temp:  (!) 97.5 F (36.4 C) 98 F (36.7 C) 97.8 F (36.6 C)  TempSrc:  Oral Oral Oral  SpO2:  100% 98% 97%  Weight:      Height:       General appearance: Awake alert.  In no distress Resp: Clear to auscultation bilaterally.  Normal effort Cardio: S1-S2 is normal regular.  No S3-S4.  No rubs murmurs or bruit GI: Abdomen is soft.  Nontender nondistended.  Bowel sounds are  present normal.  No masses organomegaly    DISPOSITION: Home with son  Discharge Instructions    Ambulatory referral to Neurology   Complete by: As directed    An appointment is requested in approximately: 6 weeks for stroke f/u   Call MD for:  difficulty breathing, headache or visual disturbances   Complete by: As directed    Call MD for:  extreme fatigue   Complete by: As directed    Call MD for:  persistant dizziness or light-headedness   Complete by: As directed    Call MD for:  persistant nausea and vomiting   Complete by: As directed    Call MD for:  severe uncontrolled pain   Complete by: As directed    Call MD for:  temperature >100.4   Complete by: As directed    Diet - low sodium heart healthy   Complete by: As directed    Discharge instructions   Complete by: As directed    Please take your medications as prescribed.  Referral has been sent to neurology for follow-up.  Please follow-up with your primary care provider next week.  You were cared for by a hospitalist during your hospital stay. If you have any questions about your discharge medications or the care you received while you were in the hospital after you are discharged, you can call the unit and asked to speak with the hospitalist on call if the hospitalist that took care of you is not available. Once you are discharged, your primary care physician will handle any further medical issues. Please note that NO REFILLS for any discharge medications will be authorized once you are discharged, as it is imperative that you return to your primary care physician (or establish a  relationship with a primary care physician if you do not have one) for your aftercare needs so that they can reassess your need for medications and monitor your lab values. If you do not have a primary care physician, you can call [phone] for a physician referral.   Increase activity slowly   Complete by: As directed          Allergies as of  10/30/2020      Reactions   Motrin [ibuprofen] Hives   Atorvastatin Hives, Rash   chills   Meloxicam Hives, Rash   Statins Hives, Rash      Medication List    STOP taking these medications   albuterol 108 (90 Base) MCG/ACT inhaler Commonly known as: Proventil HFA     TAKE these medications   allopurinol 300 MG tablet Commonly known as: ZYLOPRIM Take 1 tablet (300 mg total) by mouth daily.   aspirin 81 MG EC tablet Take 1 tablet (81 mg total) by mouth daily. Swallow whole. Start taking on: October 31, 2020   blood glucose meter kit and supplies Kit Dispense based on patient and insurance preference. Use up to four times daily as directed. (FOR ICD-9 250.00, 250.01).   carvedilol 6.25 MG tablet Commonly known as: COREG Take 1 tablet (6.25 mg total) by mouth 2 (two) times daily with a meal. What changed:   medication strength  how much to take   clopidogrel 75 MG tablet Commonly known as: PLAVIX Take 1 tablet (75 mg total) by mouth daily for 21 days. Start taking on: October 31, 2020   econazole nitrate 1 % cream Apply 1 application topically 2 (two) times daily.   FLUoxetine 10 MG capsule Commonly known as: PROZAC Take 1 capsule (10 mg total) by mouth daily. Start taking on: October 31, 2020   insulin glargine 100 UNIT/ML Solostar Pen Commonly known as: LANTUS Inject 15 Units into the skin 2 (two) times daily.   Insulin Pen Needle 30G X 8 MM Misc Commonly known as: NOVOFINE Inject 10 each into the skin as directed.   linagliptin 5 MG Tabs tablet Commonly known as: TRADJENTA Take 1 tablet (5 mg total) by mouth daily.   lisinopril 20 MG tablet Commonly known as: ZESTRIL Take 1 tablet (20 mg total) by mouth daily. What changed:   medication strength  how much to take   pravastatin 10 MG tablet Commonly known as: PRAVACHOL Take 2 tablets (20 mg total) by mouth daily.   vitamin B-12 500 MCG tablet Commonly known as: CYANOCOBALAMIN Take 1 tablet  (500 mcg total) by mouth daily. Start taking on: October 31, 2020         Follow-up Information    Vernie Shanks, MD. Schedule an appointment as soon as possible for a visit in 1 week(s).   Specialty: Family Medicine Contact information: [place] [address] [phone]               TOTAL DISCHARGE TIME: 31 minutes  [place]  [facility] Pager on [email or web address]  10/30/2020, 12:53 PM

## 2020-10-30 NOTE — Progress Notes (Signed)
Spoke to [facility] and updated her on pt's discharge order. Pt's son is not able to come and pick her up. Pt will stay overnight

## 2020-10-30 NOTE — Progress Notes (Signed)
Inpatient Diabetes Program Recommendations  AACE/ADA: New Consensus Statement on Inpatient Glycemic Control (2015)  Target Ranges:  Prepandial:   less than 140 mg/dL      Peak postprandial:   less than 180 mg/dL (1-2 hours)      Critically ill patients:  140 - 180 mg/dL   Lab Results  Component Value Date   GLUCAP 153 (H) 10/30/2020   HGBA1C 10.0 (H) 10/26/2020    Review of Glycemic Control Results for Caitlin Harrington, Caitlin Harrington (MRN [id number]) as of 10/30/2020 14:38  Ref. Range 10/29/2020 21:29 10/30/2020 08:20 10/30/2020 11:31  Glucose-Capillary Latest Ref Range: 70 - 99 mg/dL 846 (H) 93 962 (H)   Diabetes history: Type 2 DM Outpatient Diabetes medications: none Current orders for Inpatient glycemic control: Lantus 15 units BID, Novolog 0-15 units TID & HS  Inpatient Diabetes Program Recommendations:    Spoke with patient briefly to discuss outpatient diabetes management. Patient struggling to answer questions. Patient had been on insulin injections and stopped in June 2021 due to "not always wanting to since it was my bedtime". Reviewed patient's current A1c of 10.0%. Explained what a A1c is and what it measures. Also reviewed goal A1c with patient, importance of good glucose control @ home, and blood sugar goals. Discussed use of insulin. Patient has experienced hypo events in the past and can verbalize interventions. Uses Freestyle libre at home for monitoring. Reviewed when to call MD and to make sure she follows up with PCP. Patient willing to perform QD injection and has used vial and syringe. At discharge, consider resuming Lantus 25 units QD.  Following for disposition plan. Thanks, Lujean Rave, MSN, RNC-OB Diabetes Coordinator [phone] (8a-5p)
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
## 2020-10-30 NOTE — Plan of Care (Signed)
  Problem: Activity: Goal: Risk for activity intolerance will decrease Outcome: Progressing   Problem: Nutrition: Goal: Adequate nutrition will be maintained Outcome: Progressing   Problem: Coping: Goal: Level of anxiety will decrease Outcome: Progressing   

## 2020-10-31 LAB — GLUCOSE, CAPILLARY: Glucose-Capillary: 106 mg/dL — ABNORMAL HIGH (ref 70–99)

## 2020-10-31 LAB — CULTURE, BLOOD (SINGLE): Culture: NO GROWTH

## 2020-10-31 NOTE — Progress Notes (Signed)
Patient was discharged yesterday.  Please see discharge summary.    Patient's son could not come to pick her up yesterday evening.    No overnight events.  Patient feels well this morning.  Denies any complaints.  Waiting on her son to come and pick her up.  Vital signs reviewed.  Blood pressure noted to be elevated.  She will be resuming her antihypertensives at home.  Other issues are stable.  Prescriptions were sent to her pharmacy.  She knows that she needs to follow-up with her primary care provider next week.  Please reviewed discharge summary for other details.  Remains stable for discharge.  Osvaldo Shipper 10/31/2020

## 2020-10-31 NOTE — Progress Notes (Signed)
Discharge instructions (including medications) discussed with and copy provided to patient/caregiver 

## 2020-11-01 ENCOUNTER — Other Ambulatory Visit: Payer: Self-pay | Admitting: *Deleted

## 2020-11-01 ENCOUNTER — Encounter: Payer: Self-pay | Admitting: *Deleted

## 2020-11-01 ENCOUNTER — Other Ambulatory Visit: Payer: Self-pay

## 2020-11-01 NOTE — Patient Outreach (Signed)
[facility]) Care Management  11/01/2020  Caitlin Harrington [birth year] [id number]   CSW made an initial attempt to try and contact patient today to perform the initial phone assessment, as well as assess and assist with social work needs and services, without success.  CSW was unable to leave a HIPAA compliant message on voicemail for patient, receiving an automated recording stating, "Welcome to [employer], your call cannot be completed as dialed, please check the number and try your call again.  CSW made several additional attempts, again without success.  CSW will make a second outreach attempt within the next 3-4 business days, if a return call is not received from patient in the meantime.  CSW will also mail a Patient Unsuccessful Outreach Letter to patient's home, requesting that she contact CSW at her earliest convenience, if she interested in receiving social work services through CSW with [facility], music.  Danford Bad, BSW, MSW, LCSW  Licensed Restaurant manager, [employer] [facility]  Mailing [address], [place], [place] [address] Physical Address[address], [place], [place] [address] Toll Free Main # [phone] Fax # [phone] Cell # [phone]  [email or web address]

## 2020-11-04 DIAGNOSIS — E1159 Type 2 diabetes mellitus with other circulatory complications: Secondary | ICD-10-CM | POA: Diagnosis not present

## 2020-11-04 DIAGNOSIS — E559 Vitamin D deficiency, unspecified: Secondary | ICD-10-CM | POA: Diagnosis not present

## 2020-11-04 DIAGNOSIS — Z794 Long term (current) use of insulin: Secondary | ICD-10-CM | POA: Diagnosis not present

## 2020-11-04 DIAGNOSIS — I10 Essential (primary) hypertension: Secondary | ICD-10-CM | POA: Diagnosis not present

## 2020-11-04 DIAGNOSIS — M109 Gout, unspecified: Secondary | ICD-10-CM | POA: Diagnosis not present

## 2020-11-04 DIAGNOSIS — G9341 Metabolic encephalopathy: Secondary | ICD-10-CM | POA: Diagnosis not present

## 2020-11-04 DIAGNOSIS — Z8673 Personal history of transient ischemic attack (TIA), and cerebral infarction without residual deficits: Secondary | ICD-10-CM | POA: Diagnosis not present

## 2020-11-04 DIAGNOSIS — R899 Unspecified abnormal finding in specimens from other organs, systems and tissues: Secondary | ICD-10-CM | POA: Diagnosis not present

## 2020-11-06 ENCOUNTER — Encounter: Payer: Self-pay | Admitting: *Deleted

## 2020-11-06 ENCOUNTER — Other Ambulatory Visit: Payer: Self-pay | Admitting: *Deleted

## 2020-11-06 DIAGNOSIS — R7989 Other specified abnormal findings of blood chemistry: Secondary | ICD-10-CM | POA: Insufficient documentation

## 2020-11-06 DIAGNOSIS — R202 Paresthesia of skin: Secondary | ICD-10-CM | POA: Insufficient documentation

## 2020-11-06 DIAGNOSIS — J329 Chronic sinusitis, unspecified: Secondary | ICD-10-CM | POA: Insufficient documentation

## 2020-11-06 DIAGNOSIS — I10 Essential (primary) hypertension: Secondary | ICD-10-CM | POA: Insufficient documentation

## 2020-11-06 DIAGNOSIS — Z91199 Patient's noncompliance with other medical treatment and regimen due to unspecified reason: Secondary | ICD-10-CM | POA: Insufficient documentation

## 2020-11-06 DIAGNOSIS — K5732 Diverticulitis of large intestine without perforation or abscess without bleeding: Secondary | ICD-10-CM | POA: Insufficient documentation

## 2020-11-06 DIAGNOSIS — E038 Other specified hypothyroidism: Secondary | ICD-10-CM | POA: Insufficient documentation

## 2020-11-06 DIAGNOSIS — G5603 Carpal tunnel syndrome, bilateral upper limbs: Secondary | ICD-10-CM | POA: Insufficient documentation

## 2020-11-06 DIAGNOSIS — R209 Unspecified disturbances of skin sensation: Secondary | ICD-10-CM | POA: Insufficient documentation

## 2020-11-06 DIAGNOSIS — J309 Allergic rhinitis, unspecified: Secondary | ICD-10-CM | POA: Insufficient documentation

## 2020-11-06 DIAGNOSIS — F32A Depression, unspecified: Secondary | ICD-10-CM | POA: Insufficient documentation

## 2020-11-06 DIAGNOSIS — R5383 Other fatigue: Secondary | ICD-10-CM | POA: Insufficient documentation

## 2020-11-06 DIAGNOSIS — Z794 Long term (current) use of insulin: Secondary | ICD-10-CM | POA: Insufficient documentation

## 2020-11-06 DIAGNOSIS — N289 Disorder of kidney and ureter, unspecified: Secondary | ICD-10-CM | POA: Insufficient documentation

## 2020-11-06 DIAGNOSIS — M129 Arthropathy, unspecified: Secondary | ICD-10-CM | POA: Insufficient documentation

## 2020-11-06 DIAGNOSIS — G9341 Metabolic encephalopathy: Secondary | ICD-10-CM | POA: Insufficient documentation

## 2020-11-06 DIAGNOSIS — G4733 Obstructive sleep apnea (adult) (pediatric): Secondary | ICD-10-CM | POA: Insufficient documentation

## 2020-11-06 DIAGNOSIS — R899 Unspecified abnormal finding in specimens from other organs, systems and tissues: Secondary | ICD-10-CM | POA: Insufficient documentation

## 2020-11-06 DIAGNOSIS — W57XXXA Bitten or stung by nonvenomous insect and other nonvenomous arthropods, initial encounter: Secondary | ICD-10-CM | POA: Insufficient documentation

## 2020-11-06 DIAGNOSIS — M25579 Pain in unspecified ankle and joints of unspecified foot: Secondary | ICD-10-CM | POA: Insufficient documentation

## 2020-11-06 DIAGNOSIS — L309 Dermatitis, unspecified: Secondary | ICD-10-CM | POA: Insufficient documentation

## 2020-11-06 DIAGNOSIS — R519 Headache, unspecified: Secondary | ICD-10-CM | POA: Insufficient documentation

## 2020-11-06 DIAGNOSIS — R21 Rash and other nonspecific skin eruption: Secondary | ICD-10-CM | POA: Insufficient documentation

## 2020-11-06 DIAGNOSIS — Z8673 Personal history of transient ischemic attack (TIA), and cerebral infarction without residual deficits: Secondary | ICD-10-CM | POA: Insufficient documentation
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
## 2020-11-07 ENCOUNTER — Ambulatory Visit: Payer: Self-pay | Admitting: *Deleted

## 2020-11-13 ENCOUNTER — Encounter: Payer: Self-pay | Admitting: Family Medicine

## 2020-11-19 ENCOUNTER — Other Ambulatory Visit: Payer: Self-pay | Admitting: *Deleted

## 2020-11-19 NOTE — Patient Outreach (Signed)
[facility]) Care Management  11/19/2020  Caitlin Harrington 08/05/1946 [id number]   [facility] unsuccessful outreach for Post hospital follow up  Referral Date: 11/01/20 Referral Source: [facility] hospital liaison, Mike Gip Referral Reason: Please assign to [facility] RN CM coordinator for complex care and disease management follow up calls and assess for further needs DM needs A1c >10  Insurance: blue cross and blue shield federal PPO retired fed medicare also pension   Last cone admissions 10/25/20 to 10/30/20 acute stroke-subacute left parietooccipital lobes infarcts (PCA territory), acute metabolic encephalopathy, sepsis ruled out uncontrolled DM with hyperglycemia, history of depression, HTN 05/12/20 to 05/15/20 UTI without hematuria, encephalopathy COVID-19 infection in February 2021     Transition of care services noted to be completed by primary care MD office staff- Dr Zollie Beckers family medicine Transition of Care will be completed by primary care provider office who will refer to [facility] care management if needed.   [facility] RN CM made aware of [facility] SW outreach, assessment and case closure for 11/06/20   Outreach attempt # 2 [facility] Unsuccessful outreach   Outreach attempt to the home number to follow up on DME (BP cuff) progression of home care)  No answer. [facility] RN CM left HIPAA [facility] Portability and Accountability Act) compliant voicemail message along with CM's contact info.   Plan: [facility] RN CM scheduled this patient for another call attempt within 4-7 business days  Astrid Vides L. [name], RN, BSN, CCM [facility] Telephonic Care Management Care Coordinator Office number [phone] Mobile number [phone]  Main [facility] number [phone] Fax number [phone]

## 2020-11-26 ENCOUNTER — Other Ambulatory Visit: Payer: Self-pay | Admitting: *Deleted

## 2020-11-26 NOTE — Patient Outreach (Signed)
[facility] Customer service manager [facility]) Care Management  11/26/2020  Caitlin Harrington Aug 21, 1946 [id number]   [facility] second unsuccessful outreach forPost hospitalfollow up referred patient  Referral Date:11/01/20 Referral Source:[facility] hospital liaison, V Brewer Referral Reason:Please assign to [facility] RN CM coordinator for complex care and disease management follow up calls and assess for further needs DM needs A1c >10 Insurance:blue cross and blue shieldfederal PPO retired fed medicare also pension   Last cone admissions12/3/21 to 10/30/20 acute stroke-subacute left parietooccipital lobes infarcts (PCA territory), acute metabolic encephalopathy, sepsis ruled out uncontrolled DM with hyperglycemia, history of depression, HTN 05/12/20 to 05/15/20 UTI without hematuria, encephalopathy COVID-19 infection in February 2021   Transition of care services noted to be completed by primary care MD office staff- Dr Zollie Beckers family medicine Transition of Care will be completed by primary care provider office who will refer to [facility] care management if needed.   [facility] RN CM made aware of [facility] SW outreach, assessment and case closure for 11/06/20  Outreach attempt #3 Outreach attempt to the home number to follow up on DME (BP cuff) progression of home care  No answer. Automated message reports her voice mail box is full   [facility] RN CM left HIPAA [facility] Portability and Accountability Act) compliant SMS message  along with CM's contact info.   Plan: [facility] unsuccessful outreach letter sent  [facility] RN CM scheduled this patient for another call attempt within 7-14 business days  Tanesia Butner L. [name], RN, BSN, CCM [facility] Telephonic Care Management Care Coordinator Office number [phone] Main [facility] number [phone] Fax number [phone]

## 2020-12-06 ENCOUNTER — Encounter: Payer: Self-pay | Admitting: Family Medicine

## 2020-12-11 ENCOUNTER — Other Ambulatory Visit: Payer: Self-pay | Admitting: *Deleted

## 2020-12-11 NOTE — Patient Outreach (Signed)
[facility] Customer service manager [facility]) Care Management  12/11/2020  CHYREL TAHA 1946/09/28 [id number]   [facility] third unsuccessful outreach forPost hospitalfollow up referred patient  Referral Date:11/01/20 Referral Source:[facility] hospital liaison, V Brewer Referral Reason:Please assign to [facility] RN CM coordinator for complex care and disease management follow up calls and assess for further needs DM needs A1c >10 Insurance:blue cross and blue shieldfederal PPO retired fed medicare also pension   Last cone admissions12/3/21 to 10/30/20 acute stroke-subacute left parietoccipital lobes infarcts (PCA territory), acute metabolic encephalopathy, sepsis ruled out uncontrolled DM with hyperglycemia, history of depression, HTN 05/12/20 to 05/15/20 UTI without hematuria, encephalopathy COVID-19 infection in February 2021   Transition of care services noted to be completed by primary care MD office staff- Dr Zollie Beckers family medicine Transition of Care will be completed by primary care provider office who will refer to [facility] care management if needed.   [facility] RN CM made aware of [facility] SW outreach, assessment and case closure for 11/06/20  Last successful outreach with patient was on 11/06/20 Third Outreach attempt to the home numberto follow up on DME (BP cuff) progression of home care No answer.  [facility] RN CM left HIPAA [facility] Portability and Accountability Act) compliant  message along with CM's contact info.   Plan: [facility] unsuccessful outreach letter sent on 11/26/20 Unsuccessful outreach attempts to active [facility] patient has been made on 11/19/20, 11/26/20 and 12/11/20 [facility] RN CM pended this active patient for case closure in 10  business days pending return call from patient per workflow  Routed noted to [employer]  L. [name], RN, BSN, CCM [facility] Telephonic Care Management Care Coordinator Office number [phone] Main [facility] number [phone] Fax number [phone]

## 2020-12-27 ENCOUNTER — Other Ambulatory Visit: Payer: Self-pay | Admitting: *Deleted

## 2020-12-27 NOTE — Patient Outreach (Signed)
[facility]) Care Management  12/27/2020  Caitlin Harrington [birth year] [id number]   [facility] Case closure   [facility] unsuccessful outreach letter senton 11/26/20 Unsuccessful outreach attempts to active [facility] patient has been made on 11/19/20, 11/26/20 and 12/11/20   Plan [facility] RN CM will close case after no response from patient within 10 business days. Unable to reach Case closure letters sent to patient and MD  [name] L. [name], RN, BSN, CCM [facility] Telephonic Care Management Care Coordinator Office number [phone] Mobile number [phone]  Main [facility] number [phone] Fax number [phone]

## 2021-01-06 DIAGNOSIS — Z794 Long term (current) use of insulin: Secondary | ICD-10-CM | POA: Diagnosis not present

## 2021-01-06 DIAGNOSIS — E1159 Type 2 diabetes mellitus with other circulatory complications: Secondary | ICD-10-CM | POA: Diagnosis not present

## 2021-01-06 DIAGNOSIS — M109 Gout, unspecified: Secondary | ICD-10-CM | POA: Diagnosis not present

## 2021-01-06 DIAGNOSIS — E785 Hyperlipidemia, unspecified: Secondary | ICD-10-CM | POA: Diagnosis not present

## 2021-01-06 DIAGNOSIS — E559 Vitamin D deficiency, unspecified: Secondary | ICD-10-CM | POA: Diagnosis not present

## 2021-01-06 DIAGNOSIS — R899 Unspecified abnormal finding in specimens from other organs, systems and tissues: Secondary | ICD-10-CM | POA: Diagnosis not present

## 2021-01-06 DIAGNOSIS — I10 Essential (primary) hypertension: Secondary | ICD-10-CM | POA: Diagnosis not present

## 2021-01-06 DIAGNOSIS — Z8673 Personal history of transient ischemic attack (TIA), and cerebral infarction without residual deficits: Secondary | ICD-10-CM | POA: Diagnosis not present

## 2021-01-13 ENCOUNTER — Inpatient Hospital Stay: Payer: Medicare Other | Admitting: Neurology

## 2021-05-03 ENCOUNTER — Encounter (HOSPITAL_COMMUNITY): Payer: Self-pay | Admitting: Emergency Medicine

## 2021-05-03 ENCOUNTER — Emergency Department (HOSPITAL_COMMUNITY): Payer: Medicare Other

## 2021-05-03 ENCOUNTER — Other Ambulatory Visit: Payer: Self-pay

## 2021-05-03 ENCOUNTER — Inpatient Hospital Stay (HOSPITAL_COMMUNITY)
Admission: EM | Admit: 2021-05-03 | Discharge: 2021-05-07 | DRG: 064 | Disposition: A | Discharge status: Home / Self Care | Payer: Medicare Other | Attending: Internal Medicine | Admitting: Internal Medicine

## 2021-05-03 DIAGNOSIS — F32A Depression, unspecified: Secondary | ICD-10-CM | POA: Diagnosis present

## 2021-05-03 DIAGNOSIS — R29810 Facial weakness: Secondary | ICD-10-CM | POA: Diagnosis not present

## 2021-05-03 DIAGNOSIS — Z8673 Personal history of transient ischemic attack (TIA), and cerebral infarction without residual deficits: Secondary | ICD-10-CM

## 2021-05-03 DIAGNOSIS — Z7984 Long term (current) use of oral hypoglycemic drugs: Secondary | ICD-10-CM | POA: Diagnosis not present

## 2021-05-03 DIAGNOSIS — G319 Degenerative disease of nervous system, unspecified: Secondary | ICD-10-CM | POA: Diagnosis not present

## 2021-05-03 DIAGNOSIS — R4182 Altered mental status, unspecified: Secondary | ICD-10-CM | POA: Diagnosis not present

## 2021-05-03 DIAGNOSIS — N179 Acute kidney failure, unspecified: Secondary | ICD-10-CM | POA: Diagnosis present

## 2021-05-03 DIAGNOSIS — Z79890 Hormone replacement therapy (postmenopausal): Secondary | ICD-10-CM | POA: Diagnosis not present

## 2021-05-03 DIAGNOSIS — E876 Hypokalemia: Secondary | ICD-10-CM | POA: Diagnosis present

## 2021-05-03 DIAGNOSIS — I340 Nonrheumatic mitral (valve) insufficiency: Secondary | ICD-10-CM | POA: Diagnosis not present

## 2021-05-03 DIAGNOSIS — Z20822 Contact with and (suspected) exposure to covid-19: Secondary | ICD-10-CM | POA: Diagnosis not present

## 2021-05-03 DIAGNOSIS — I6381 Other cerebral infarction due to occlusion or stenosis of small artery: Secondary | ICD-10-CM | POA: Diagnosis not present

## 2021-05-03 DIAGNOSIS — E1165 Type 2 diabetes mellitus with hyperglycemia: Secondary | ICD-10-CM | POA: Diagnosis not present

## 2021-05-03 DIAGNOSIS — Z888 Allergy status to other drugs, medicaments and biological substances status: Secondary | ICD-10-CM

## 2021-05-03 DIAGNOSIS — Z8616 Personal history of COVID-19: Secondary | ICD-10-CM | POA: Diagnosis not present

## 2021-05-03 DIAGNOSIS — J45909 Unspecified asthma, uncomplicated: Secondary | ICD-10-CM | POA: Diagnosis present

## 2021-05-03 DIAGNOSIS — G4733 Obstructive sleep apnea (adult) (pediatric): Secondary | ICD-10-CM | POA: Diagnosis present

## 2021-05-03 DIAGNOSIS — Z6834 Body mass index (BMI) 34.0-34.9, adult: Secondary | ICD-10-CM

## 2021-05-03 DIAGNOSIS — Z7982 Long term (current) use of aspirin: Secondary | ICD-10-CM

## 2021-05-03 DIAGNOSIS — E039 Hypothyroidism, unspecified: Secondary | ICD-10-CM | POA: Diagnosis not present

## 2021-05-03 DIAGNOSIS — E669 Obesity, unspecified: Secondary | ICD-10-CM | POA: Diagnosis present

## 2021-05-03 DIAGNOSIS — E119 Type 2 diabetes mellitus without complications: Secondary | ICD-10-CM

## 2021-05-03 DIAGNOSIS — R29700 NIHSS score 0: Secondary | ICD-10-CM | POA: Diagnosis present

## 2021-05-03 DIAGNOSIS — Z833 Family history of diabetes mellitus: Secondary | ICD-10-CM

## 2021-05-03 DIAGNOSIS — E785 Hyperlipidemia, unspecified: Secondary | ICD-10-CM | POA: Diagnosis present

## 2021-05-03 DIAGNOSIS — Z794 Long term (current) use of insulin: Secondary | ICD-10-CM

## 2021-05-03 DIAGNOSIS — I10 Essential (primary) hypertension: Secondary | ICD-10-CM | POA: Diagnosis not present

## 2021-05-03 DIAGNOSIS — R471 Dysarthria and anarthria: Secondary | ICD-10-CM | POA: Diagnosis present

## 2021-05-03 DIAGNOSIS — G4700 Insomnia, unspecified: Secondary | ICD-10-CM | POA: Diagnosis not present

## 2021-05-03 DIAGNOSIS — R519 Headache, unspecified: Secondary | ICD-10-CM | POA: Diagnosis not present

## 2021-05-03 DIAGNOSIS — G9341 Metabolic encephalopathy: Secondary | ICD-10-CM | POA: Diagnosis present

## 2021-05-03 DIAGNOSIS — M109 Gout, unspecified: Secondary | ICD-10-CM | POA: Diagnosis present

## 2021-05-03 DIAGNOSIS — R531 Weakness: Secondary | ICD-10-CM | POA: Diagnosis not present

## 2021-05-03 DIAGNOSIS — I083 Combined rheumatic disorders of mitral, aortic and tricuspid valves: Secondary | ICD-10-CM | POA: Diagnosis not present

## 2021-05-03 DIAGNOSIS — I639 Cerebral infarction, unspecified: Secondary | ICD-10-CM | POA: Diagnosis not present

## 2021-05-03 DIAGNOSIS — I6389 Other cerebral infarction: Secondary | ICD-10-CM | POA: Diagnosis not present

## 2021-05-03 DIAGNOSIS — Z87440 Personal history of urinary (tract) infections: Secondary | ICD-10-CM

## 2021-05-03 DIAGNOSIS — E559 Vitamin D deficiency, unspecified: Secondary | ICD-10-CM | POA: Diagnosis not present

## 2021-05-03 DIAGNOSIS — Z90710 Acquired absence of both cervix and uterus: Secondary | ICD-10-CM

## 2021-05-03 LAB — URINALYSIS, ROUTINE W REFLEX MICROSCOPIC
Bacteria, UA: NONE SEEN
Bilirubin Urine: NEGATIVE
Glucose, UA: >=500 mg/dL — AB
Hgb urine dipstick: NEGATIVE
Ketones, ur: NEGATIVE mg/dL
Leukocytes,Ua: NEGATIVE
Nitrite: NEGATIVE
Protein, ur: NEGATIVE mg/dL
Specific Gravity, Urine: 1.023 (ref 1.005–1.030)
pH: 5 (ref 5.0–8.0)

## 2021-05-03 LAB — COMPREHENSIVE METABOLIC PANEL
ALT: 19 U/L (ref 0–44)
AST: 20 U/L (ref 15–41)
Albumin: 3.6 g/dL (ref 3.5–5.0)
Alkaline Phosphatase: 111 U/L (ref 38–126)
Anion gap: 12 (ref 5–15)
BUN: 29 mg/dL — ABNORMAL HIGH (ref 8–23)
CO2: 25 mmol/L (ref 22–32)
Calcium: 9 mg/dL (ref 8.9–10.3)
Chloride: 95 mmol/L — ABNORMAL LOW (ref 98–111)
Creatinine, Ser: 1.88 mg/dL — ABNORMAL HIGH (ref 0.44–1.00)
GFR, Estimated: 28 mL/min — ABNORMAL LOW (ref >60)
Glucose, Bld: 366 mg/dL — ABNORMAL HIGH (ref 70–99)
Potassium: 4.1 mmol/L (ref 3.5–5.1)
Sodium: 132 mmol/L — ABNORMAL LOW (ref 135–145)
Total Bilirubin: 0.3 mg/dL (ref 0.3–1.2)
Total Protein: 7.5 g/dL (ref 6.5–8.1)

## 2021-05-03 LAB — DIFFERENTIAL
Abs Immature Granulocytes: 0.04 10*3/uL (ref 0.00–0.07)
Basophils Absolute: 0.1 10*3/uL (ref 0.0–0.1)
Basophils Relative: 1 %
Eosinophils Absolute: 0.3 10*3/uL (ref 0.0–0.5)
Eosinophils Relative: 3 %
Immature Granulocytes: 0 %
Lymphocytes Relative: 27 %
Lymphs Abs: 2.7 10*3/uL (ref 0.7–4.0)
Monocytes Absolute: 0.6 10*3/uL (ref 0.1–1.0)
Monocytes Relative: 7 %
Neutro Abs: 6.1 10*3/uL (ref 1.7–7.7)
Neutrophils Relative %: 62 %

## 2021-05-03 LAB — CBC
HCT: 42.6 % (ref 36.0–46.0)
Hemoglobin: 14.1 g/dL (ref 12.0–15.0)
MCH: 28.3 pg (ref 26.0–34.0)
MCHC: 33.1 g/dL (ref 30.0–36.0)
MCV: 85.4 fL (ref 80.0–100.0)
Platelets: 300 10*3/uL (ref 150–400)
RBC: 4.99 MIL/uL (ref 3.87–5.11)
RDW: 13 % (ref 11.5–15.5)
WBC: 9.9 10*3/uL (ref 4.0–10.5)
nRBC: 0 % (ref 0.0–0.2)

## 2021-05-03 LAB — RESP PANEL BY RT-PCR (FLU A&B, COVID) ARPGX2
Influenza A by PCR: NEGATIVE
Influenza B by PCR: NEGATIVE
SARS Coronavirus 2 by RT PCR: NEGATIVE

## 2021-05-03 LAB — RAPID URINE DRUG SCREEN, HOSP PERFORMED
Amphetamines: NOT DETECTED
Barbiturates: NOT DETECTED
Benzodiazepines: NOT DETECTED
Cocaine: NOT DETECTED
Opiates: NOT DETECTED
Tetrahydrocannabinol: NOT DETECTED

## 2021-05-03 LAB — PROTIME-INR
INR: 1 (ref 0.8–1.2)
Prothrombin Time: 12.8 seconds (ref 11.4–15.2)

## 2021-05-03 LAB — I-STAT CHEM 8, ED
BUN: 27 mg/dL — ABNORMAL HIGH (ref 8–23)
Calcium, Ion: 1.09 mmol/L — ABNORMAL LOW (ref 1.15–1.40)
Chloride: 96 mmol/L — ABNORMAL LOW (ref 98–111)
Creatinine, Ser: 1.8 mg/dL — ABNORMAL HIGH (ref 0.44–1.00)
Glucose, Bld: 366 mg/dL — ABNORMAL HIGH (ref 70–99)
HCT: 43 % (ref 36.0–46.0)
Hemoglobin: 14.6 g/dL (ref 12.0–15.0)
Potassium: 4 mmol/L (ref 3.5–5.1)
Sodium: 133 mmol/L — ABNORMAL LOW (ref 135–145)
TCO2: 25 mmol/L (ref 22–32)

## 2021-05-03 LAB — ETHANOL: Alcohol, Ethyl (B): <10 mg/dL (ref <10)

## 2021-05-03 LAB — APTT: aPTT: 25 seconds (ref 24–36)

## 2021-05-03 LAB — TROPONIN I (HIGH SENSITIVITY): Troponin I (High Sensitivity): 6 ng/L (ref <18)

## 2021-05-03 MED ORDER — SODIUM CHLORIDE 0.9 % IV BOLUS
500.0000 mL | Freq: Once | INTRAVENOUS | Status: AC
  Administered 2021-05-03: 500 mL via INTRAVENOUS

## 2021-05-03 MED ORDER — LORAZEPAM 2 MG/ML IJ SOLN
0.5000 mg | Freq: Once | INTRAMUSCULAR | Status: AC
  Administered 2021-05-03: 0.5 mg via INTRAVENOUS
  Filled 2021-05-03: qty 1

## 2021-05-03 NOTE — ED Triage Notes (Signed)
Patient reports headache and generalized weakness since last night. Hx stroke.

## 2021-05-03 NOTE — ED Notes (Signed)
Patient in mri

## 2021-05-03 NOTE — ED Notes (Signed)
MRI called 

## 2021-05-03 NOTE — ED Provider Notes (Signed)
[facility]-EMERGENCY DEPT Provider Note   CSN: [id number] Arrival date & time: 05/03/21  1653     History Chief Complaint  Patient presents with   Weakness    Caitlin Harrington is a 75 y.o. female.  The history is provided by the patient and medical records.  Weakness Caitlin Harrington is a 75 y.o. female who presents to the Emergency Department complaining of headache.  This morning she woke up with a headache to the right parietal region and her boyfriend thought she was having a stroke.  She was last known well at midnight.  She describes the headache as nothing terrible.  She had trouble getting comfortable this morning due to headache.  Headaches are uncommon for her.  HA is now resolved.  Not the worst HA of her life.  HA was gradual in onset.  SO was concerned for stroke due to hx/o multiple CVA in March.    No fever, sob, cough, abdominal, N/V/D.  Feels weak if she tries to stand or walk.  No dysuria.    Had some chest discomfort last night.  Has associated fatigue - gradually worsening over time.     Past Medical History:  Diagnosis Date   Asthma    COVID-19    Depression    Diabetes mellitus without complication (HCC)    Fatigue    Gout    Hypertension    Insomnia    Numbness    Obesity    Paresthesia    Vitamin D deficiency     Patient Active Problem List   Diagnosis Date Noted   Pain in joint involving ankle and foot 11/06/2020   Acute renal insufficiency 11/06/2020   Allergic rhinitis 11/06/2020   Arthropathy 11/06/2020   Benign essential hypertension 11/06/2020   Bilateral carpal tunnel syndrome 11/06/2020   Chronic sinusitis 11/06/2020   Diverticulitis of colon 11/06/2020   Eczema 11/06/2020   Headache 11/06/2020   Long term (current) use of insulin (HCC) 11/06/2020   Metabolic encephalopathy 11/06/2020   Noncompliance with treatment 11/06/2020   Obstructive sleep apnea syndrome 11/06/2020   Other specified abnormal findings of  blood chemistry 11/06/2020   Paresthesias 11/06/2020   Personal history of transient ischemic attack (TIA), and cerebral infarction without residual deficits 11/06/2020   Rash 11/06/2020   Depression 11/06/2020   Skin sensation disturbance 11/06/2020   Subclinical hypothyroidism 11/06/2020   Tick bite 11/06/2020   Tiredness 11/06/2020   Unspecified abnormal finding in specimens from other organs, systems and tissues 11/06/2020   Cerebral thrombosis with cerebral infarction 10/29/2020   Major depressive disorder, recurrent episode, moderate (HCC) 10/27/2020   Allergic reaction 10/26/2020   AKI (acute kidney injury) (HCC) 10/26/2020   Thrush 10/26/2020   Urinary tract infection without hematuria 05/12/2020   Encephalopathy 05/12/2020   Altered mental status    Hyperglycemia    Acute on chronic respiratory failure with hypoxia (HCC) 01/03/2020   COVID-19 12/30/2019   Hyperuricemia    Hypertension 02/18/2011   Prolonged depressive adjustment reaction 02/18/2011   Obesity 02/18/2011   Asthma, mild persistent 02/18/2011   Vitamin D deficiency 02/18/2011   Hyperlipidemia, unspecified 02/18/2011   Diabetes mellitus type 2, controlled, without complications (HCC) 02/18/2011   Insomnia 02/18/2011   Stress at work 02/18/2011    Past Surgical History:  Procedure Laterality Date   ABDOMINAL HYSTERECTOMY       OB History   No obstetric history on file.     Family History    Problem Relation Age of Onset   Diabetes Maternal Grandfather     Social History   Tobacco Use   Smoking status: Never   Smokeless tobacco: Never  Substance Use Topics   Alcohol use: Not Currently   Drug use: No    Home Medications Prior to Admission medications   Medication Sig Start Date End Date Taking? Authorizing Provider  albuterol (VENTOLIN HFA) 108 (90 Base) MCG/ACT inhaler 2 puffs as needed 05/15/20   [provider]  allopurinol (ZYLOPRIM) 300 MG tablet Take 1 tablet (300 mg total)  by mouth daily. Patient not taking: Reported on 10/26/2020 01/05/20   Annita Brod, MD  allopurinol (ZYLOPRIM) 300 MG tablet 1 tablet    [provider]  amLODipine (NORVASC) 5 MG tablet 1 tablet    [provider]  aspirin EC 81 MG EC tablet Take 1 tablet (81 mg total) by mouth daily. Swallow whole. 10/31/20   [name], MD  blood glucose meter kit and supplies KIT Dispense based on patient and insurance preference. Use up to four times daily as directed. (FOR ICD-9 250.00, 250.01). 05/15/20   [name], MD  Blood Pressure Monitoring (BLOOD PRESSURE KIT) KIT See admin instructions. 11/04/20   [provider]  carvedilol (COREG) 6.25 MG tablet Take 1 tablet (6.25 mg total) by mouth 2 (two) times daily with a meal. 10/30/20 12/29/20  [name], MD  colchicine 0.6 MG tablet See admin instructions.    [provider]  Continuous Blood Gluc Sensor (FREESTYLE LIBRE 14 DAY SENSOR) MISC USE TO MONITOR BLOOD SUGAR DAILY AS DIRECTED    [provider]  Continuous Blood Gluc Sensor (Golden) MISC See admin instructions. 02/21/18   [provider]  diphenhydrAMINE (BENADRYL ALLERGY) 25 MG tablet 1 tablet at bedtime as needed    [provider]  econazole nitrate 1 % cream Apply 1 application topically 2 (two) times daily. 05/02/20   [provider]  FLUoxetine (PROZAC) 10 MG capsule Take 1 capsule (10 mg total) by mouth daily. 10/31/20   [name], MD  fluticasone [facility] ALLERGY RELIEF) 50 MCG/ACT nasal spray 2 spray in each nostril    [provider]  glipiZIDE (GLUCOTROL) 5 MG tablet 1 tablet    [provider]  hydrOXYzine (ATARAX/VISTARIL) 25 MG tablet 1 tablet as needed 03/13/19   [provider]  insulin glargine (LANTUS) 100 UNIT/ML Solostar Pen Inject 15 Units into the skin 2 (two) times daily. 10/30/20 11/29/20  [name], MD  Insulin Pen Needle (NOVOFINE) 30G  X 8 MM MISC Inject 10 each into the skin as directed. 10/30/20   [name], MD  Levothyroxine Sodium 25 MCG CAPS 1 capsule on an empty stomach in the morning    [provider]  linagliptin (TRADJENTA) 5 MG TABS tablet Take 1 tablet (5 mg total) by mouth daily. 05/15/20 06/14/20  [name], MD  lisinopril (ZESTRIL) 20 MG tablet Take 1 tablet (20 mg total) by mouth daily. 10/30/20 12/29/20  [name], MD  losartan (COZAAR) 100 MG tablet 1 tablet    [provider]  metFORMIN (GLUCOPHAGE) 500 MG tablet 1 tablet with a meal    [provider]  methylPREDNISolone (MEDROL DOSEPAK) 4 MG TBPK tablet See admin instructions. 05/02/20   [provider]  PARoxetine (PAXIL) 40 MG tablet 1 tablet in the morning    [provider]  pravastatin (PRAVACHOL) 10 MG tablet Take 2 tablets (20 mg total)  by mouth daily. Patient not taking: Reported on 10/26/2020 01/04/20   Annita Brod, MD  sitaGLIPtin (JANUVIA) 100 MG tablet 1 tablet 11/07/20   [provider]  terazosin (HYTRIN) 5 MG capsule 1 capsule    [provider]  vitamin B-12 (CYANOCOBALAMIN) 500 MCG tablet Take 1 tablet (500 mcg total) by mouth daily. 10/31/20   [name], MD  zolpidem Lorrin Mais) 5 MG tablet 1 tablet 10/24/15   [provider]    Allergies    Motrin [ibuprofen], Atorvastatin, Meloxicam, and Statins  Review of Systems   Review of Systems  Neurological:  Positive for weakness.  All other systems reviewed and are negative.  Physical Exam Updated Vital Signs BP (!) 159/90   Pulse 77   Temp 98.2 F (36.8 C) (Oral)   Resp 18   Ht 5' (1.524 m)   Wt 89.4 kg   SpO2 91%   BMI 38.47 kg/m   Physical Exam Vitals and nursing note reviewed.  Constitutional:      Appearance: She is well-developed.  HENT:     Head: Normocephalic and atraumatic.  Cardiovascular:     Rate and Rhythm: Normal rate and regular rhythm.     Heart sounds: No murmur  heard. Pulmonary:     Effort: Pulmonary effort is normal. No respiratory distress.     Breath sounds: Normal breath sounds.  Abdominal:     Palpations: Abdomen is soft.     Tenderness: There is no abdominal tenderness. There is no guarding or rebound.  Musculoskeletal:        General: No tenderness.  Skin:    General: Skin is warm and dry.  Neurological:     Mental Status: She is alert and oriented to person, place, and time.     Comments: PERRL, EOMI, no asymmetry of facial movements, 5/5 strength in all four extremities with sensation to light touch intact in all four extremities.  Visual fields grossly intact.   Psychiatric:        Behavior: Behavior normal.    ED Results / Procedures / Treatments   Labs (all labs ordered are listed, but only abnormal results are displayed) Labs Reviewed  COMPREHENSIVE METABOLIC PANEL - Abnormal; Notable for the following components:      Result Value   Sodium 132 (*)    Chloride 95 (*)    Glucose, Bld 366 (*)    BUN 29 (*)    Creatinine, Ser 1.88 (*)    GFR, Estimated 28 (*)    All other components within normal limits  URINALYSIS, ROUTINE W REFLEX MICROSCOPIC - Abnormal; Notable for the following components:   Glucose, UA >=500 (*)    All other components within normal limits  I-STAT CHEM 8, ED - Abnormal; Notable for the following components:   Sodium 133 (*)    Chloride 96 (*)    BUN 27 (*)    Creatinine, Ser 1.80 (*)    Glucose, Bld 366 (*)    Calcium, Ion 1.09 (*)    All other components within normal limits  RESP PANEL BY RT-PCR (FLU A&B, COVID) ARPGX2  ETHANOL  PROTIME-INR  APTT  CBC  DIFFERENTIAL  RAPID URINE DRUG SCREEN, HOSP PERFORMED  TROPONIN I (HIGH SENSITIVITY)  TROPONIN I (HIGH SENSITIVITY)    EKG None  Radiology CT HEAD WO CONTRAST  Result Date: 05/03/2021 CLINICAL DATA:  Headache and generalized weakness since last evening. EXAM: CT HEAD WITHOUT CONTRAST TECHNIQUE: Contiguous axial images were obtained  from the base of the skull through the vertex without intravenous contrast. COMPARISON:  Head CT 10/26/2020 FINDINGS: Brain: Stable age advanced cerebral atrophy, ventriculomegaly and periventricular white matter disease. Stable remote right basal ganglia infarct with encephalomalacia and ex vacuo dilatation of the frontal horn of the right lateral ventricle. No extra-axial fluid collections are identified. No CT findings for acute hemispheric infarction or intracranial hemorrhage. No mass lesions. The brainstem and cerebellum are normal. Vascular: Vascular calcifications but no aneurysm or hyperdense vessels. Skull: No skull fracture or bone lesions. Sinuses/Orbits: The paranasal sinuses and mastoid air cells are clear. The globes are intact. Other: No scalp lesions or scalp hematoma. IMPRESSION: 1. Stable age advanced cerebral atrophy, ventriculomegaly and periventricular white matter disease. 2. Stable remote right basal ganglia infarct. 3. No acute intracranial findings or mass lesions. Electronically Signed   By: Marijo Sanes M.D.   On: 05/03/2021 18:20    Procedures Procedures   Medications Ordered in ED Medications  sodium chloride 0.9 % bolus 500 mL (0 mLs Intravenous Stopped 05/03/21 2208)  LORazepam (ATIVAN) injection 0.5 mg (0.5 mg Intravenous Given 05/03/21 2221)    ED Course  I have reviewed the triage vital signs and the nursing notes.  Pertinent labs & imaging results that were available during my care of the patient were reviewed by me and considered in my medical decision making (see chart for details).    MDM Rules/Calculators/A&P                         patient with history of CVA here for evaluation of difficulty walking today, feeling unwell. On examination she has no focal neurologic deficits. Labs with mild AK I, started on IV fluids. Patient care transferred pending MRI and reassessment.  Final Clinical Impression(s) / ED Diagnoses Final diagnoses:  None    Rx / DC  Orders ED Discharge Orders     None        Quintella Reichert, MD 05/04/21 0008

## 2021-05-03 NOTE — ED Provider Notes (Signed)
  Provider Note MRN:  [id number]  Arrival date & time: 05/04/21    ED Course and Medical Decision Making  Assumed care from Dr. Madilyn Hook at shift change.  Headache, stumbling, question ataxia, awaiting MRI.  Mild AKI.  Plan is to follow-up MRI, if acute stroke will admit.  If normal and patient can ambulate would be a candidate for discharge.  MRI positive for acute ischemic stroke.  Will consult neurology and admit to medicine.  .Critical Care  Date/Time: 05/04/2021 1:29 AM Performed by: Sabas Sous, MD Authorized by: Sabas Sous, MD   Critical care provider statement:    Critical care time (minutes):  32   Critical care was necessary to treat or prevent imminent or life-threatening deterioration of the following conditions: Acute ischemic stroke.   Critical care was time spent personally by me on the following activities:  Discussions with consultants, evaluation of patient's response to treatment, examination of patient, ordering and performing treatments and interventions, ordering and review of laboratory studies, ordering and review of radiographic studies, pulse oximetry, re-evaluation of patient's condition, obtaining history from patient or surrogate and review of old charts  Final Clinical Impressions(s) / ED Diagnoses     ICD-10-CM   1. Acute ischemic stroke [facility])  I63.9       ED Discharge Orders     None       Discharge Instructions   None     Elmer Sow. Pilar Plate, MD [facility] Emergency Medicine [facility] [email or web address]    Sabas Sous, MD 05/04/21 [phone]

## 2021-05-03 NOTE — ED Provider Notes (Addendum)
Emergency Medicine Provider Triage Evaluation Note  Caitlin Harrington , a 75 y.o. female  was evaluated in triage.  Pt complains of headache, feeling of weakness, feeling off since last night.  Work-up this morning with a headache that has been constant.  States her husband noticed that the left part of her face was drooping and that her words sounded different than normal for her.  Says she had a history of strokes previously and was worked up at [facility].  She is not on any blood thinners.  Review of Systems  Positive: Headache, gait problem, lightheaded, weakness, dysarthria, facial droop Negative: Nausea, vomiting, SOB, chest pain 12 hours  Physical Exam  BP 122/75   Pulse 69   Temp 98.8 F (37.1 C) (Oral)   Resp 15   SpO2 100%  Gen:   Awake, no distress   Resp:  Normal effort  MSK:   Moves extremities without difficulty. Unstable gait.  Other:  Patient has slight drooping to the left lip.  There is some slurring of her speech, although not sure what her normal speech sounds like for comparison. There is no focal deficit on exam.  Grip strength is equal bilaterally, lower extremity strength is equal bilaterally.  Sensation is equal bilaterally to the upper and lower extremities.  Medical Decision Making  Medically screening exam initiated at 5:17 PM.  Appropriate orders placed.  JAMELYN BOVARD was informed that the remainder of the evaluation will be completed by another provider, this initial triage assessment does not replace that evaluation, and the importance of remaining in the ED until their evaluation is complete.  Last normal last night. Out of window. ED stroke workup initiated.    Theron Arista, PA-C 05/03/21 1721    Theron Arista, PA-C 05/03/21 1722    Tilden Fossa, MD 05/05/21 [phone]

## 2021-05-04 ENCOUNTER — Inpatient Hospital Stay (HOSPITAL_COMMUNITY): Payer: Medicare Other

## 2021-05-04 ENCOUNTER — Encounter (HOSPITAL_COMMUNITY): Payer: Self-pay | Admitting: Internal Medicine

## 2021-05-04 ENCOUNTER — Observation Stay (HOSPITAL_COMMUNITY): Payer: Medicare Other

## 2021-05-04 DIAGNOSIS — R4182 Altered mental status, unspecified: Secondary | ICD-10-CM | POA: Diagnosis not present

## 2021-05-04 DIAGNOSIS — E119 Type 2 diabetes mellitus without complications: Secondary | ICD-10-CM

## 2021-05-04 DIAGNOSIS — R471 Dysarthria and anarthria: Secondary | ICD-10-CM | POA: Diagnosis present

## 2021-05-04 DIAGNOSIS — I6381 Other cerebral infarction due to occlusion or stenosis of small artery: Secondary | ICD-10-CM | POA: Diagnosis not present

## 2021-05-04 DIAGNOSIS — E1165 Type 2 diabetes mellitus with hyperglycemia: Secondary | ICD-10-CM | POA: Diagnosis present

## 2021-05-04 DIAGNOSIS — N179 Acute kidney failure, unspecified: Secondary | ICD-10-CM

## 2021-05-04 DIAGNOSIS — I083 Combined rheumatic disorders of mitral, aortic and tricuspid valves: Secondary | ICD-10-CM | POA: Diagnosis not present

## 2021-05-04 DIAGNOSIS — I6389 Other cerebral infarction: Secondary | ICD-10-CM

## 2021-05-04 DIAGNOSIS — I10 Essential (primary) hypertension: Secondary | ICD-10-CM | POA: Diagnosis present

## 2021-05-04 DIAGNOSIS — Z8616 Personal history of COVID-19: Secondary | ICD-10-CM | POA: Diagnosis not present

## 2021-05-04 DIAGNOSIS — F32A Depression, unspecified: Secondary | ICD-10-CM | POA: Diagnosis present

## 2021-05-04 DIAGNOSIS — I340 Nonrheumatic mitral (valve) insufficiency: Secondary | ICD-10-CM | POA: Diagnosis not present

## 2021-05-04 DIAGNOSIS — J45909 Unspecified asthma, uncomplicated: Secondary | ICD-10-CM | POA: Diagnosis present

## 2021-05-04 DIAGNOSIS — Z79890 Hormone replacement therapy (postmenopausal): Secondary | ICD-10-CM | POA: Diagnosis not present

## 2021-05-04 DIAGNOSIS — E559 Vitamin D deficiency, unspecified: Secondary | ICD-10-CM | POA: Diagnosis not present

## 2021-05-04 DIAGNOSIS — Z6834 Body mass index (BMI) 34.0-34.9, adult: Secondary | ICD-10-CM | POA: Diagnosis not present

## 2021-05-04 DIAGNOSIS — E785 Hyperlipidemia, unspecified: Secondary | ICD-10-CM | POA: Diagnosis present

## 2021-05-04 DIAGNOSIS — R531 Weakness: Secondary | ICD-10-CM | POA: Diagnosis present

## 2021-05-04 DIAGNOSIS — Z7982 Long term (current) use of aspirin: Secondary | ICD-10-CM | POA: Diagnosis not present

## 2021-05-04 DIAGNOSIS — I639 Cerebral infarction, unspecified: Secondary | ICD-10-CM | POA: Diagnosis present

## 2021-05-04 DIAGNOSIS — R29810 Facial weakness: Secondary | ICD-10-CM | POA: Diagnosis present

## 2021-05-04 DIAGNOSIS — R29700 NIHSS score 0: Secondary | ICD-10-CM | POA: Diagnosis present

## 2021-05-04 DIAGNOSIS — M109 Gout, unspecified: Secondary | ICD-10-CM | POA: Diagnosis present

## 2021-05-04 DIAGNOSIS — G9341 Metabolic encephalopathy: Secondary | ICD-10-CM | POA: Diagnosis present

## 2021-05-04 DIAGNOSIS — E669 Obesity, unspecified: Secondary | ICD-10-CM | POA: Diagnosis present

## 2021-05-04 DIAGNOSIS — G4733 Obstructive sleep apnea (adult) (pediatric): Secondary | ICD-10-CM | POA: Diagnosis present

## 2021-05-04 DIAGNOSIS — Z7984 Long term (current) use of oral hypoglycemic drugs: Secondary | ICD-10-CM | POA: Diagnosis not present

## 2021-05-04 DIAGNOSIS — E876 Hypokalemia: Secondary | ICD-10-CM | POA: Diagnosis present

## 2021-05-04 DIAGNOSIS — G4700 Insomnia, unspecified: Secondary | ICD-10-CM | POA: Diagnosis present

## 2021-05-04 DIAGNOSIS — E039 Hypothyroidism, unspecified: Secondary | ICD-10-CM | POA: Diagnosis present

## 2021-05-04 DIAGNOSIS — Z20822 Contact with and (suspected) exposure to covid-19: Secondary | ICD-10-CM | POA: Diagnosis present

## 2021-05-04 LAB — CBC
HCT: 42.1 % (ref 36.0–46.0)
Hemoglobin: 14.1 g/dL (ref 12.0–15.0)
MCH: 28.5 pg (ref 26.0–34.0)
MCHC: 33.5 g/dL (ref 30.0–36.0)
MCV: 85.2 fL (ref 80.0–100.0)
Platelets: 251 10*3/uL (ref 150–400)
RBC: 4.94 MIL/uL (ref 3.87–5.11)
RDW: 13.2 % (ref 11.5–15.5)
WBC: 11.6 10*3/uL — ABNORMAL HIGH (ref 4.0–10.5)
nRBC: 0 % (ref 0.0–0.2)

## 2021-05-04 LAB — LIPID PANEL
Cholesterol: 205 mg/dL — ABNORMAL HIGH (ref 0–200)
HDL: 36 mg/dL — ABNORMAL LOW (ref >40)
LDL Cholesterol: 145 mg/dL — ABNORMAL HIGH (ref 0–99)
Total CHOL/HDL Ratio: 5.7 RATIO
Triglycerides: 119 mg/dL (ref <150)
VLDL: 24 mg/dL (ref 0–40)

## 2021-05-04 LAB — GLUCOSE, CAPILLARY: Glucose-Capillary: 122 mg/dL — ABNORMAL HIGH (ref 70–99)

## 2021-05-04 LAB — COMPREHENSIVE METABOLIC PANEL
ALT: 16 U/L (ref 0–44)
AST: 17 U/L (ref 15–41)
Albumin: 3.5 g/dL (ref 3.5–5.0)
Alkaline Phosphatase: 100 U/L (ref 38–126)
Anion gap: 10 (ref 5–15)
BUN: 30 mg/dL — ABNORMAL HIGH (ref 8–23)
CO2: 25 mmol/L (ref 22–32)
Calcium: 8.8 mg/dL — ABNORMAL LOW (ref 8.9–10.3)
Chloride: 100 mmol/L (ref 98–111)
Creatinine, Ser: 1.36 mg/dL — ABNORMAL HIGH (ref 0.44–1.00)
GFR, Estimated: 41 mL/min — ABNORMAL LOW (ref >60)
Glucose, Bld: 188 mg/dL — ABNORMAL HIGH (ref 70–99)
Potassium: 3.3 mmol/L — ABNORMAL LOW (ref 3.5–5.1)
Sodium: 135 mmol/L (ref 135–145)
Total Bilirubin: 0.4 mg/dL (ref 0.3–1.2)
Total Protein: 7.3 g/dL (ref 6.5–8.1)

## 2021-05-04 LAB — CBG MONITORING, ED
Glucose-Capillary: 169 mg/dL — ABNORMAL HIGH (ref 70–99)
Glucose-Capillary: 128 mg/dL — ABNORMAL HIGH (ref 70–99)
Glucose-Capillary: 115 mg/dL — ABNORMAL HIGH (ref 70–99)

## 2021-05-04 LAB — ECHOCARDIOGRAM COMPLETE
Area-P 1/2: 2.91 cm2
Height: 60 in
S' Lateral: 2.3 cm
Weight: 3152 oz

## 2021-05-04 LAB — TSH: TSH: 4.49 u[IU]/mL (ref 0.350–4.500)

## 2021-05-04 LAB — TROPONIN I (HIGH SENSITIVITY): Troponin I (High Sensitivity): 8 ng/L (ref <18)

## 2021-05-04 MED ORDER — ROSUVASTATIN CALCIUM 20 MG PO TABS
20.0000 mg | ORAL_TABLET | Freq: Every day | ORAL | Status: DC
  Administered 2021-05-05 – 2021-05-07 (×3): 20 mg via ORAL
  Filled 2021-05-04 (×3): qty 1

## 2021-05-04 MED ORDER — FLUOXETINE HCL 10 MG PO CAPS
10.0000 mg | ORAL_CAPSULE | Freq: Every day | ORAL | Status: DC
  Administered 2021-05-04 – 2021-05-07 (×4): 10 mg via ORAL
  Filled 2021-05-04 (×4): qty 1

## 2021-05-04 MED ORDER — ACETAMINOPHEN 650 MG RE SUPP: 650.0000 mg | RECTAL | Status: DC | PRN

## 2021-05-04 MED ORDER — POTASSIUM CHLORIDE 10 MEQ/100ML IV SOLN
10.0000 meq | INTRAVENOUS | Status: AC
  Administered 2021-05-04 (×4): 10 meq via INTRAVENOUS
  Filled 2021-05-04 (×3): qty 100

## 2021-05-04 MED ORDER — INSULIN ASPART 100 UNIT/ML IJ SOLN
0.0000 [IU] | Freq: Every day | INTRAMUSCULAR | Status: DC
  Administered 2021-05-06: 3 [IU] via SUBCUTANEOUS
  Filled 2021-05-04: qty 0.05

## 2021-05-04 MED ORDER — PRAVASTATIN SODIUM 20 MG PO TABS
20.0000 mg | ORAL_TABLET | Freq: Every day | ORAL | Status: DC
  Administered 2021-05-04: 20 mg via ORAL
  Filled 2021-05-04: qty 1

## 2021-05-04 MED ORDER — INSULIN ASPART 100 UNIT/ML IJ SOLN
0.0000 [IU] | Freq: Three times a day (TID) | INTRAMUSCULAR | Status: DC
  Administered 2021-05-04: 3 [IU] via SUBCUTANEOUS
  Administered 2021-05-04 – 2021-05-05 (×2): 2 [IU] via SUBCUTANEOUS
  Administered 2021-05-05: 5 [IU] via SUBCUTANEOUS
  Administered 2021-05-06: 8 [IU] via SUBCUTANEOUS
  Administered 2021-05-06: 3 [IU] via SUBCUTANEOUS
  Administered 2021-05-07: 5 [IU] via SUBCUTANEOUS
  Filled 2021-05-04: qty 0.15

## 2021-05-04 MED ORDER — STROKE: EARLY STAGES OF RECOVERY BOOK
Freq: Once | Status: DC
  Filled 2021-05-04: qty 1

## 2021-05-04 MED ORDER — ZOLPIDEM TARTRATE 5 MG PO TABS: 5.0000 mg | ORAL_TABLET | Freq: Every evening | ORAL | Status: DC | PRN

## 2021-05-04 MED ORDER — ASPIRIN EC 81 MG PO TBEC
81.0000 mg | DELAYED_RELEASE_TABLET | Freq: Every day | ORAL | Status: DC
  Administered 2021-05-05 – 2021-05-07 (×3): 81 mg via ORAL
  Filled 2021-05-04 (×4): qty 1

## 2021-05-04 MED ORDER — ACETAMINOPHEN 160 MG/5ML PO SOLN: 650.0000 mg | ORAL | Status: DC | PRN

## 2021-05-04 MED ORDER — ACETAMINOPHEN 325 MG PO TABS: 650.0000 mg | ORAL_TABLET | ORAL | Status: DC | PRN

## 2021-05-04 MED ORDER — HYDROXYZINE HCL 25 MG PO TABS: 25.0000 mg | ORAL_TABLET | Freq: Three times a day (TID) | ORAL | Status: DC | PRN

## 2021-05-04 MED ORDER — ENOXAPARIN SODIUM 30 MG/0.3ML IJ SOSY
30.0000 mg | PREFILLED_SYRINGE | INTRAMUSCULAR | Status: DC
  Administered 2021-05-04 – 2021-05-07 (×4): 30 mg via SUBCUTANEOUS
  Filled 2021-05-04 (×5): qty 0.3

## 2021-05-04 MED ORDER — ALBUTEROL SULFATE HFA 108 (90 BASE) MCG/ACT IN AERS
1.0000 | INHALATION_SPRAY | RESPIRATORY_TRACT | Status: DC | PRN
  Filled 2021-05-04: qty 6.7

## 2021-05-04 NOTE — Consult Note (Signed)
Neurology Consultation  Reason for Consult: new stroke Referring Physician: Dillard Cannon, MD  CC: new stroke on MRI  History is obtained from: patient and chart.   HPI: Caitlin Harrington is a 75 y.o. female with a PMHx of uncontrolled DM II, HTN, HLD, asthma, depression, and prior CVA in 10/2020 (Left PCA territory). CTA head and neck then, showed intracranial atherosclerosis, and chronic microhemorrhages to bilateral basal ganglia. On that admission, she was found to be non adherent to medications and her HbA1c was 10%. She was placed on ASA and Plavix x 21 days, then ASA alone.   Patient was admitted to [facility] on 05/03/21 for HA, feeling of weakness and feeling "off" for a day. She woke up with a HA on 05/03/21 and her husband thought the left side of her face was drooping and her speech did not sound normal for patient. Patient was too anxious to go for MRI, so she was given Ativan and became too sedated for much of an exam. Because of continued sedation, patient was taken back to [facility] to evaluate for extension of stroke or hemorrhagic conversion and neither found.   Today, she denies HA or feeling weak in one extremity. No blurry or double vision. She thinks her speech sounds normal for her.   Work up thus far showed hypokalemia, acute/subacute strokes on MRI, negative UA, LDL 145. Urine culture, TSH, echocardiogram, and A1c are pending.    Patient will be transferred to [facility] for more stroke workup and is getting ready now for MRA head and neck.   Neurology was asked to consult due to new finding of stroke  mRS: 0 tPA/Throbmectomy: No, outside window LKW: unclear, atleast a few days ago.   ROS: A robust ROS was performed and is negative except as noted in the HPI.   Past Medical History:  Diagnosis Date   Asthma    COVID-19    Depression    Diabetes mellitus without complication ([place])    Fatigue    Gout    Hypertension    Insomnia    Numbness    Obesity    Paresthesia    Vitamin D  deficiency   Stroke 10/2020.  ? Vitamin B12 deficiency.   Family History  Problem Relation Age of Onset   Diabetes Maternal Grandfather     Social History:   reports that she has never smoked. She has never used smokeless tobacco. She reports previous alcohol use. She reports that she does not use drugs.  Medications  Current Facility-Administered Medications:     stroke: mapping our early stages of recovery book, , Does not apply, Once, Zierle-Ghosh, [place] B, DO   acetaminophen (TYLENOL) tablet 650 mg, 650 mg, Oral, Q4H PRN **OR** acetaminophen (TYLENOL) 160 MG/5ML solution 650 mg, 650 mg, Per Tube, Q4H PRN **OR** acetaminophen (TYLENOL) suppository 650 mg, 650 mg, Rectal, Q4H PRN, Zierle-Ghosh, Asia B, DO   albuterol (VENTOLIN HFA) 108 (90 Base) MCG/ACT inhaler 1 puff, 1 puff, Inhalation, Q4H PRN, Zierle-Ghosh, Asia B, DO   aspirin EC tablet 81 mg, 81 mg, Oral, Daily, Zierle-Ghosh, Asia B, DO   enoxaparin (LOVENOX) injection 30 mg, 30 mg, Subcutaneous, Q24H, Zierle-Ghosh, Asia B, DO, 30 mg at 05/04/21 1212   FLUoxetine (PROZAC) capsule 10 mg, 10 mg, Oral, Daily, Zierle-Ghosh, Asia B, DO, 10 mg at 05/04/21 0959   hydrOXYzine (ATARAX/VISTARIL) tablet 25 mg, 25 mg, Oral, TID PRN, Zierle-Ghosh, Asia B, DO   insulin aspart (novoLOG) injection 0-15 Units, 0-15 Units,  Subcutaneous, TID WC, Zierle-Ghosh, Asia B, DO, 2 Units at 05/04/21 1222   insulin aspart (novoLOG) injection 0-5 Units, 0-5 Units, Subcutaneous, QHS, Zierle-Ghosh, Asia B, DO   potassium chloride 10 mEq in 100 mL IVPB, 10 mEq, Intravenous, Q1 Hr x 4, Florencia Reasons, MD, Last Rate: 100 mL/hr at 05/04/21 1213, 10 mEq at 05/04/21 1213   pravastatin (PRAVACHOL) tablet 20 mg, 20 mg, Oral, Daily, Zierle-Ghosh, Asia B, DO, 20 mg at 05/04/21 0959   zolpidem (AMBIEN) tablet 5 mg, 5 mg, Oral, QHS PRN, Zierle-Ghosh, Asia B, DO  Current Outpatient Medications:    carvedilol (COREG) 6.25 MG tablet, Take 6.25 mg by mouth 2 (two) times daily with a  meal., Disp: , Rfl:    FLUoxetine (PROZAC) 10 MG tablet, Take 10 mg by mouth daily., Disp: , Rfl:    insulin glargine (LANTUS) 100 UNIT/ML Solostar Pen, Inject into the skin daily., Disp: , Rfl:    lisinopril (ZESTRIL) 20 MG tablet, Take 20 mg by mouth daily., Disp: , Rfl:    albuterol (VENTOLIN HFA) 108 (90 Base) MCG/ACT inhaler, 2 puffs as needed, Disp: , Rfl:    allopurinol (ZYLOPRIM) 300 MG tablet, Take 300 mg by mouth daily., Disp: , Rfl:    amLODipine (NORVASC) 5 MG tablet, 1 tablet, Disp: , Rfl:    aspirin EC 81 MG EC tablet, Take 1 tablet (81 mg total) by mouth daily. Swallow whole., Disp: 30 tablet, Rfl: 11   blood glucose meter kit and supplies KIT, Dispense based on patient and insurance preference. Use up to four times daily as directed. (FOR ICD-9 250.00, 250.01)., Disp: 1 each, Rfl: 0   Blood Pressure Monitoring (BLOOD PRESSURE KIT) KIT, See admin instructions., Disp: , Rfl:    colchicine 0.6 MG tablet, See admin instructions., Disp: , Rfl:    Continuous Blood Gluc Sensor (FREESTYLE LIBRE 14 DAY SENSOR) MISC, USE TO MONITOR BLOOD SUGAR DAILY AS DIRECTED, Disp: , Rfl:    Continuous Blood Gluc Sensor ([place]) MISC, See admin instructions., Disp: , Rfl:    diphenhydrAMINE (BENADRYL ALLERGY) 25 MG tablet, 1 tablet at bedtime as needed, Disp: , Rfl:    econazole nitrate 1 % cream, Apply 1 application topically 2 (two) times daily., Disp: , Rfl:    fluticasone (FLONASE ALLERGY RELIEF) 50 MCG/ACT nasal spray, 2 spray in each nostril, Disp: , Rfl:    glipiZIDE (GLUCOTROL) 5 MG tablet, 1 tablet, Disp: , Rfl:    hydrOXYzine (ATARAX/VISTARIL) 25 MG tablet, 1 tablet as needed, Disp: , Rfl:    Insulin Pen Needle (NOVOFINE) 30G X 8 MM MISC, Inject 10 each into the skin as directed., Disp: 90 each, Rfl: 0   Levothyroxine Sodium 25 MCG CAPS, 1 capsule on an empty stomach in the morning, Disp: , Rfl:    linagliptin (TRADJENTA) 5 MG TABS tablet, Take 1 tablet (5 mg total) by  mouth daily., Disp: 30 tablet, Rfl: 0   losartan (COZAAR) 100 MG tablet, 1 tablet, Disp: , Rfl:    metFORMIN (GLUCOPHAGE) 500 MG tablet, 1 tablet with a meal, Disp: , Rfl:    methylPREDNISolone (MEDROL DOSEPAK) 4 MG TBPK tablet, See admin instructions., Disp: , Rfl:    PARoxetine (PAXIL) 40 MG tablet, 1 tablet in the morning, Disp: , Rfl:    pravastatin (PRAVACHOL) 10 MG tablet, Take 2 tablets (20 mg total) by mouth daily. (Patient not taking: Reported on 10/26/2020), Disp: 30 tablet, Rfl: 1   sitaGLIPtin (JANUVIA) 100 MG tablet, Take  100 mg by mouth daily., Disp: , Rfl:    terazosin (HYTRIN) 5 MG capsule, 1 capsule, Disp: , Rfl:    vitamin B-12 (CYANOCOBALAMIN) 500 MCG tablet, Take 1 tablet (500 mcg total) by mouth daily., Disp: 30 tablet, Rfl: 2   zolpidem (AMBIEN) 5 MG tablet, 1 tablet, Disp: , Rfl:    Exam: Current vital signs: BP (!) 149/89 (BP Location: Right Arm)   Pulse (!) 59   Temp 97.6 F (36.4 C) (Oral)   Resp 14   Ht 5' (1.524 m)   Wt 89.4 kg   SpO2 98%   BMI 38.47 kg/m  Vital signs in last 24 hours: Temp:  [97.6 F (36.4 C)-98.8 F (37.1 C)] 97.6 F (36.4 C) (06/12 1010) Pulse Rate:  [58-77] 59 (06/12 1010) Resp:  [12-28] 14 (06/12 1010) BP: (96-159)/(64-134) 149/89 (06/12 1010) SpO2:  [91 %-100 %] 98 % (06/12 1010) Weight:  [89.4 kg] 89.4 kg (06/11 2114)  PE: GENERAL: Well appearing lying comfortably in ED bed. Awake, alert in NAD. HEENT: - Normocephalic and atraumatic. LUNGS - Normal respiratory effort.  CV - RRR on tele. ABDOMEN - Soft, nontender. Ext: warm, well perfused. IV infiltration on left arm.  Psych: affect slightly dull, but calm, cooperative.  NEURO:  Mental Status: AA&O to self, place, day, and year, but not to date or month.  Speech/Language: speech is without dysarthria or aphasia.  Naming, repetition, fluency, and comprehension intact.  Cranial Nerves:  II: PERRL 57m/brisk. visual fields full.  III, IV, VI: EOMI. Lid elevation  symmetric and full.  V: sensation is intact and symmetrical to face.  VII: mild L facial droop.  VIII:hearing intact to voice IX, X: palate elevation is symmetric. Phonation normal.  XI: normal sternocleidomastoid and trapezius muscle strength XYKD:XIPJASis symmetrical without fasciculations.   Motor: RUE: grips  5/5    triceps 5/5   biceps  5/5                 LUE: grips  5/5  unable to check triceps and biceps due to pain from IV.             RLE:  knee  5/5   thigh  5/5     plantar flexion  5/5     dorsiflexion   5/5            LLE:  knee  5/5   thigh  5/5    plantar flexion  5/5     dorsiflexion   5/5 Tone is normal. Bulk is normal.  Sensation- Intact to light touch bilaterally in all four extremities. Extinction absent to light touch to DSS.  Coordination: FTN intact bilaterally. HKS intact bilaterally. No pronator drift.  DTRs: RUE:  biceps 2+     brachioradialis  2+            RLE:  patella  1+                LUE:  no checked due to pain and infiltrated IV.             LLE: patella  1+  Gait- deferred.  NIHSS:  1a Level of Consciousness: 0 1b LOC Questions: 0 1c LOC Commands: 0 2 Best Gaze: 0 3 Visual: 0 4 Facial Palsy: 1 5a Motor Arm - left: 0 5b Motor Arm - Right: 0 6a Motor Leg - Left: 0 6b Motor Leg - Right: 0 7 Limb Ataxia: 0 8 Sensory: 0 9  Best Language: 0 10 Dysarthria: 0 11 Extinction and Inattention: 0 TOTAL: 0  Labs I have reviewed labs in epic and the results pertinent to this consultation are: UA neg. UDS negative. LDL 145. K 3.3. A1c and TSH pending.   CBC    Component Value Date/Time   WBC 11.6 (H) 05/04/2021 0618   RBC 4.94 05/04/2021 0618   HGB 14.1 05/04/2021 0618   HCT 42.1 05/04/2021 0618   PLT 251 05/04/2021 0618   MCV 85.2 05/04/2021 0618   MCH 28.5 05/04/2021 0618   MCHC 33.5 05/04/2021 0618   RDW 13.2 05/04/2021 0618   LYMPHSABS 2.7 05/03/2021 1729   MONOABS 0.6 05/03/2021 1729   EOSABS 0.3 05/03/2021 1729   BASOSABS 0.1  05/03/2021 1729    CMP     Component Value Date/Time   NA 135 05/04/2021 0618   NA 140 09/18/2013 1040   K 3.3 (L) 05/04/2021 0618   CL 100 05/04/2021 0618   CO2 25 05/04/2021 0618   GLUCOSE 188 (H) 05/04/2021 0618   BUN 30 (H) 05/04/2021 0618   BUN 11 09/18/2013 1040   CREATININE 1.36 (H) 05/04/2021 0618   CALCIUM 8.8 (L) 05/04/2021 0618   PROT 7.3 05/04/2021 0618   PROT 7.3 09/18/2013 1040   ALBUMIN 3.5 05/04/2021 0618   ALBUMIN 4.4 09/18/2013 1040   AST 17 05/04/2021 0618   ALT 16 05/04/2021 0618   ALKPHOS 100 05/04/2021 0618   BILITOT 0.4 05/04/2021 0618   GFRNONAA 41 (L) 05/04/2021 0618   GFRAA >60 05/14/2020 0828    Lipid Panel     Component Value Date/Time   CHOL 205 (H) 05/04/2021 0618   CHOL 219 (H) 09/18/2013 1040   TRIG 119 05/04/2021 0618   HDL 36 (L) 05/04/2021 0618   HDL 50 09/18/2013 1040   CHOLHDL 5.7 05/04/2021 0618   VLDL 24 05/04/2021 0618   LDLCALC 145 (H) 05/04/2021 0618   LDLCALC 142 (H) 09/18/2013 1040     Imaging MD reviewed the images obtained  CT head 05/04/21 at 0320 hours. Acute right thalamic infarct again seen as noted on prior MRI.Other smaller frontal infarcts not visualized by CT.  No hemorrhage.  [place] 05/04/21 at 1115 hours Evidence of patient's known acute right thalamus infarct unchanged. Other two small bilateral acute infarcts seen on recent MRI are not visualized by CT. No acute hemorrhage. Moderate chronic ischemic microvascular disease. Old right basal ganglia lacunar infarct.  MRI brain 1. 1.2 cm acute ischemic nonhemorrhagic right thalamic infarct. 2. Additional 8 mm acute ischemic nonhemorrhagic cortical/subcortical infarct involving the parasagittal posterior right frontal lobe. 3. Additional 1.3 cm acute to early subacute ischemic nonhemorrhagic left frontal corona radiata infarct. 4. Underlying age-related cerebral atrophy with advanced chronic small vessel ischemic disease, with a few additional  scattered remote lacunar infarcts as above.   Assessment: 75 yo female with prior hx of stroke who presented to [facility] ED with HA and generalized weakness and has undergone some of stroke workup. Per history, she has had intracranial atherosclerosis and small vessel ischemic disease. Due to atherosclerosis, patient will be on DAPT x 90  days, then ASA alone. She has no sensation issues that one might expect to find with a thalamic infarct. There is one other tiny infarct and an acute to early subacute ischemic non hemorrhagic infarct in left frontal corona radiata.  Patient is stable now. NP spoke to patient about her noted allergy to statins. She states she had a rash only with  Atoravastin, but does not recall being tried on another statin. Given rash with Atoravastatin and no other known statins, NP thinks it is safe to try Crestor (as rash is not guaranteed with a different statin) given her LDL is high and she needs a high intensity statin. In order to Patient is going down for MRA head and neck.    Impression: -Acute ischemic right thalamic infarct.  -Tiny acute infarct in parasagittal posterior right frontal lobe.  -Acute to early subacute infarct in left frontal corona radiata.  -HLD.  -uncontrolled DM II.  -? Non adherence to medications.   Recommendations/Plan:   -- HgbA1c, TSH.  - Will do Zetia 65m for elevated LDL. - MRA brain and neck with no LVO.  - Stroke education. - Frequent neuro checks.  - follow NIHSS per protocol.  - Echocardiogram with EF of 60-65% - DAPT-ASA 835mwith Plavix 7515md x 21 days, then go to ASA 24m23m alone. - Risk factor modification. -Tight CBG control and add medications if HbA1c here is >7 which is goal with stroke.  -suggest case manager consult if non adherence is due to financial reasons.  -Permissive HTN is OK, but can be normalized in next 24-48 hours. Hold home BP meds for now.  - cardiac telemetry monitoring for arrhythmia. - PT consult, OT  consult, Speech consult. - stroke education. - Start Fluoxetine 10mg94mly for depression which can be increased as tolerated. - Recommend limited TTE bubble bubble study - Recommend Holter monitoring for 4 weeks outpatient. - Follow up with stroke clinic outpatient.   Pt seen by KarenClance BollNeuro.   Pager: [phone] ADDENDUM Performed a face to face diagnostic evaluation.   I have reviewed the contents of history and physical exam as documented by PA/ARNP/Resident and agree with above documentation.  I have discussed and formulated the above plan as documented. Edits to the note have been made as needed.  Impression/Key exam findings/Plan:  74 F 8th poorly controlled DM2, HTN, non compliance with meds p/w mild L facial droop and found to have small BL strokes. Specifically the stroke in the parasaggital posterior R frontal lobe appears to be cortical, the other 2 are subcortical and likely small vessel. This does make me suspect if she has an underlying cardioembolic or atheroembolic phenomenon. Will get Limited TTE bubble study(if positive for PFO, get BL lower extremity dopplers) and would recommend outpatient 4 week holter monitoring.  She endorses depression due to poor health and I think will benefit from being on fluoxetine.  Further recs pending above. Neurology will continue to follow.  SalmaDonnetta SimpersTriad [facility]hospitalists [phone] 7pm to 7am, please call on call as listed on AMION.

## 2021-05-04 NOTE — ED Notes (Signed)
Patient transported to MRI 

## 2021-05-04 NOTE — ED Notes (Signed)
Attempted to obtain blood work. 2 unsuccessful attempts. Will contact main lab.

## 2021-05-04 NOTE — Progress Notes (Addendum)
Patient is admitted this am for bilateral acute CVA, she received Ativan for MRI, currently is very sedated, not able to provide history, vital signs stable.  I do not see neurology notes, will contact neurology.  Patient is on transfer list to [facility]. Hypokalemia, provide IV potassium supplement, she is too drowsy to take oral intake.  4:39 pm addendum:  Neurology Dr Selina Cooley called and recommend patient be admitted to [facility], neurology will come to [place] to see patient, new admit order to [place] placed, flow manager notified

## 2021-05-04 NOTE — H&P (Signed)
TRH H&P    Patient Demographics:    Caitlin Harrington, is a 75 y.o. female  MRN: [id number]  DOB - [birth year]  Admit Date - 05/03/2021  Referring MD/NP/PA: Sedonia Small  Outpatient Primary MD for the patient is Vernie Shanks, MD  Patient coming from: Home  Chief complaint- generalized weakness   HPI:    Caitlin Harrington  is a 75 y.o. female, with history of asthma, DMII, HTN, HLD, and hx of CVA presents to the ED with a chief complaint of generalized weakness. Unfortunately, patient was too anxious to go for MRI, and had to be sedated. At the time of my exam, she was too sedated to give history. Chart review reveals that the patient was complaining of headache this morning in her right pareital region. She also had generalized weakness. She does not normally get headaches. Her last known well was midnight on 6/11.   In the ED Temp 98, heart rate 58-77, respiratory rate 12-20, blood pressure 121/64, satting at 94% No leukocytosis with a white blood cell count of 9.9, hemoglobin 14.1 Chemistry panel reveals her creatinine of 1.88, glucose of 366 UA was not indicative of UTI UDS negative Respiratory panel negative Troponin 6 MRI brain shows a 1.2 cm ischemic nonhemorrhagic infarct in the right thalamus, 8 mm acute ischemic nonhemorrhagic cortical/subcortical infarct, 1.3 cm acute to early subacute infarct and age-related cerebral atrophy EKG shows a heart rate of 67, sinus rhythm, QTC 468 ED provider consulted neuro who recommended admission at [facility] Initially ER provider physical exam shows reactive pupils, extraocular muscles intact, no facial asymmetry, 5 out of 5 strength in all 4 extremities with sensation to light touch in all 4 extremities.  Visual fields intact  On my admission exam patient was sedated with Ativan.  She withdrew to pain in 3 extremities, but did not withdraw to pain in her left upper extremity.   She required hard sternal rub, 2 awaken.  When she was awake she can follow commands with her right upper extremity but not with her left upper extremity.  She could also speak but only very slurred-probably due to the sedation.  This is an acute change from what was described by the emergency room provider, so repeat CT head was done that did not show any conversion to hemorrhage.    Review of systems:    Unfortunately, patient is too sedated to provide ROS    Past History of the following :    Past Medical History:  Diagnosis Date   Asthma    COVID-19    Depression    Diabetes mellitus without complication ([place])    Fatigue    Gout    Hypertension    Insomnia    Numbness    Obesity    Paresthesia    Vitamin D deficiency       Past Surgical History:  Procedure Laterality Date   ABDOMINAL HYSTERECTOMY        Social History:      Social History   Tobacco Use  Smoking status: Never   Smokeless tobacco: Never  Substance Use Topics   Alcohol use: Not Currently       Family History :     Family History  Problem Relation Age of Onset   Diabetes Maternal Grandfather      Home Medications:   Prior to Admission medications   Medication Sig Start Date End Date Taking? Authorizing Provider  carvedilol (COREG) 6.25 MG tablet Take 6.25 mg by mouth 2 (two) times daily with a meal.   Yes [provider]  FLUoxetine (PROZAC) 10 MG tablet Take 10 mg by mouth daily.   Yes [provider]  insulin glargine (LANTUS) 100 UNIT/ML Solostar Pen Inject into the skin daily.   Yes [provider]  lisinopril (ZESTRIL) 20 MG tablet Take 20 mg by mouth daily.   Yes [provider]  albuterol (VENTOLIN HFA) 108 (90 Base) MCG/ACT inhaler 2 puffs as needed 05/15/20   [provider]  allopurinol (ZYLOPRIM) 300 MG tablet Take 300 mg by mouth daily.    [provider]  amLODipine (NORVASC) 5 MG tablet 1 tablet    [provider]  aspirin EC 81 MG EC tablet Take 1 tablet (81 mg total) by mouth daily. Swallow whole. 10/31/20   [name], MD  blood glucose meter kit and supplies KIT Dispense based on patient and insurance preference. Use up to four times daily as directed. (FOR ICD-9 250.00, 250.01). 05/15/20   [name], MD  Blood Pressure Monitoring (BLOOD PRESSURE KIT) KIT See admin instructions. 11/04/20   [provider]  colchicine 0.6 MG tablet See admin instructions.    [provider]  Continuous Blood Gluc Sensor (FREESTYLE LIBRE 14 DAY SENSOR) MISC USE TO MONITOR BLOOD SUGAR DAILY AS DIRECTED    [provider]  Continuous Blood Gluc Sensor ([place]) MISC See admin instructions. 02/21/18   [provider]  diphenhydrAMINE (BENADRYL ALLERGY) 25 MG tablet 1 tablet at bedtime as needed    [provider]  econazole nitrate 1 % cream Apply 1 application topically 2 (two) times daily. 05/02/20   [provider]  fluticasone Asencion Islam ALLERGY RELIEF) 50 MCG/ACT nasal spray 2 spray in each nostril    [provider]  glipiZIDE (GLUCOTROL) 5 MG tablet 1 tablet    [provider]  hydrOXYzine (ATARAX/VISTARIL) 25 MG tablet 1 tablet as needed 03/13/19   [provider]  Insulin Pen Needle (NOVOFINE) 30G X 8 MM MISC Inject 10 each into the skin as directed. 10/30/20   [name], MD  Levothyroxine Sodium 25 MCG CAPS 1 capsule on an empty stomach in the morning    [provider]  linagliptin (TRADJENTA) 5 MG TABS tablet Take 1 tablet (5 mg total) by mouth daily. 05/15/20 06/14/20  [name], MD  losartan (COZAAR) 100 MG tablet 1 tablet    [provider]  metFORMIN (GLUCOPHAGE) 500 MG tablet 1 tablet with a meal    [provider]  methylPREDNISolone (MEDROL DOSEPAK) 4 MG TBPK tablet See admin instructions. 05/02/20   [provider]  PARoxetine (PAXIL) 40 MG tablet  1 tablet in the morning    [provider]  pravastatin (PRAVACHOL) 10 MG tablet Take 2 tablets (20 mg total) by mouth daily. Patient not taking: Reported on 10/26/2020 01/04/20   Annita Brod, MD  sitaGLIPtin (JANUVIA) 100 MG tablet Take 100 mg by mouth daily. 11/07/20   [provider]  terazosin (HYTRIN) 5 MG capsule 1 capsule    [provider]  vitamin B-12 (CYANOCOBALAMIN) 500 MCG tablet Take 1 tablet (500 mcg total) by mouth daily. 10/31/20   [name], MD  zolpidem Lorrin Mais) 5 MG tablet 1 tablet 10/24/15   [provider]     Allergies:     Allergies  Allergen Reactions   Motrin [Ibuprofen] Hives   Atorvastatin Hives and Rash    chills   Meloxicam Hives and Rash   Statins Hives and Rash     Physical Exam:   Vitals  Blood pressure (!) 148/134, pulse 64, temperature 98.2 F (36.8 C), temperature source Oral, resp. rate 19, height 5' (1.524 m), weight 89.4 kg, SpO2 96 %.  1.  General: Patient lying supine in bed,  no acute distress   2. Psychiatric: Could not be assessed secondary to patient's altered mental status   3. Neurologic: Withdraws from pain in all extremities except from left upper extremity, protecting airway, obtunded/sedated, pupils reactive   4. HEENMT:  Head is atraumatic, normocephalic, pupils reactive to light, neck is supple, trachea is midline, mucous membranes are moist   5. Respiratory : Lungs are clear to auscultation bilaterally without wheezing, rhonchi, rales, no cyanosis, no increase in work of breathing or accessory muscle use   6. Cardiovascular : Heart rate normal, rhythm is regular, no murmurs, rubs or gallops, no peripheral edema, peripheral pulses palpated   7. Gastrointestinal:  Abdomen is soft, nondistended, nontender to palpation bowel sounds active, no masses or organomegaly palpated   8. Skin:  Skin is warm, dry and intact without rashes, acute lesions, or ulcers on limited exam    9.Musculoskeletal:  No acute deformities or trauma, no asymmetry in tone, no peripheral edema, peripheral pulses palpated, no tenderness to palpation in the extremities     Data Review:    CBC Recent Labs  Lab 05/03/21 1729 05/03/21 1736  WBC 9.9  --   HGB 14.1 14.6  HCT 42.6 43.0  PLT 300  --   MCV 85.4  --   MCH 28.3  --   MCHC 33.1  --   RDW 13.0  --   LYMPHSABS 2.7  --   MONOABS 0.6  --   EOSABS 0.3  --   BASOSABS 0.1  --    ------------------------------------------------------------------------------------------------------------------  Results for orders placed or performed during the hospital encounter of 05/03/21 (from the past 48 hour(s))  Ethanol     Status: None   Collection Time: 05/03/21  5:29 PM  Result Value Ref Range   Alcohol, Ethyl (B) <10 <10 mg/dL    Comment: (NOTE) Lowest detectable limit for serum alcohol is 10 mg/dL.  For medical purposes only. Performed at [facility], [place] [address], [place], [place] [address]   Protime-INR     Status: None   Collection Time: 05/03/21  5:29 PM  Result Value Ref Range   Prothrombin Time 12.8 11.4 - 15.2 seconds   INR 1.0 0.8 - 1.2    Comment: (NOTE) INR goal varies based on device and disease states. Performed at [facility], [place] [address], [place], [place] [address]   APTT     Status: None   Collection Time: 05/03/21  5:29 PM  Result Value Ref Range   aPTT 25 24 - 36 seconds    Comment: Performed at [facility], [place] [address] [place],  [address]  CBC     Status: None  Collection Time: 05/03/21  5:29 PM  Result Value Ref Range   WBC 9.9 4.0 - 10.5 K/uL   RBC 4.99 3.87 - 5.11 MIL/uL   Hemoglobin 14.1 12.0 - 15.0 g/dL   HCT 42.6 36.0 - 46.0 %   MCV 85.4 80.0 - 100.0 fL   MCH 28.3 26.0 - 34.0 pg   MCHC 33.1 30.0 - 36.0 g/dL   RDW 13.0 11.5 - 15.5 %   Platelets 300 150 - 400 K/uL   nRBC 0.0 0.0 - 0.2 %    Comment: Performed  at [facility], [place] [address], [place], [place] [address]  Differential     Status: None   Collection Time: 05/03/21  5:29 PM  Result Value Ref Range   Neutrophils Relative % 62 %   Neutro Abs 6.1 1.7 - 7.7 K/uL   Lymphocytes Relative 27 %   Lymphs Abs 2.7 0.7 - 4.0 K/uL   Monocytes Relative 7 %   Monocytes Absolute 0.6 0.1 - 1.0 K/uL   Eosinophils Relative 3 %   Eosinophils Absolute 0.3 0.0 - 0.5 K/uL   Basophils Relative 1 %   Basophils Absolute 0.1 0.0 - 0.1 K/uL   Immature Granulocytes 0 %   Abs Immature Granulocytes 0.04 0.00 - 0.07 K/uL    Comment: Performed at [facility], [place] [address], [place], [place] [address]  Comprehensive metabolic panel     Status: Abnormal   Collection Time: 05/03/21  5:29 PM  Result Value Ref Range   Sodium 132 (L) 135 - 145 mmol/L   Potassium 4.1 3.5 - 5.1 mmol/L   Chloride 95 (L) 98 - 111 mmol/L   CO2 25 22 - 32 mmol/L   Glucose, Bld 366 (H) 70 - 99 mg/dL    Comment: Glucose reference range applies only to samples taken after fasting for at least 8 hours.   BUN 29 (H) 8 - 23 mg/dL   Creatinine, Ser 1.88 (H) 0.44 - 1.00 mg/dL   Calcium 9.0 8.9 - 10.3 mg/dL   Total Protein 7.5 6.5 - 8.1 g/dL   Albumin 3.6 3.5 - 5.0 g/dL   AST 20 15 - 41 U/L   ALT 19 0 - 44 U/L   Alkaline Phosphatase 111 38 - 126 U/L   Total Bilirubin 0.3 0.3 - 1.2 mg/dL   GFR, Estimated 28 (L) >60 mL/min    Comment: (NOTE) Calculated using the CKD-EPI Creatinine Equation (2021)    Anion gap 12 5 - 15    Comment: Performed at [facility], [place] [address], [place], [place] [address]  Ginger Carne 8, ED     Status: Abnormal   Collection Time: 05/03/21  5:36 PM  Result Value Ref Range   Sodium 133 (L) 135 - 145 mmol/L   Potassium 4.0 3.5 - 5.1 mmol/L   Chloride 96 (L) 98 - 111 mmol/L   BUN 27 (H) 8 - 23 mg/dL   Creatinine, Ser 1.80 (H) 0.44 - 1.00 mg/dL   Glucose, Bld 366 (H) 70 - 99 mg/dL    Comment: Glucose  reference range applies only to samples taken after fasting for at least 8 hours.   Calcium, Ion 1.09 (L) 1.15 - 1.40 mmol/L   TCO2 25 22 - 32 mmol/L   Hemoglobin 14.6 12.0 - 15.0 g/dL   HCT 43.0 36.0 - 46.0 %  Resp Panel by RT-PCR (Flu A&B, Covid) Nasopharyngeal Swab     Status: None  Collection Time: 05/03/21  8:54 PM   Specimen: Nasopharyngeal Swab; Nasopharyngeal(NP) swabs in vial transport medium  Result Value Ref Range   SARS Coronavirus 2 by RT PCR NEGATIVE NEGATIVE    Comment: (NOTE) SARS-CoV-2 target nucleic acids are NOT DETECTED.  The SARS-CoV-2 RNA is generally detectable in upper respiratory specimens during the acute phase of infection. The lowest concentration of SARS-CoV-2 viral copies this assay can detect is 138 copies/mL. A negative result does not preclude SARS-Cov-2 infection and should not be used as the sole basis for treatment or other patient management decisions. A negative result may occur with  improper specimen collection/handling, submission of specimen other than nasopharyngeal swab, presence of viral mutation(s) within the areas targeted by this assay, and inadequate number of viral copies(<138 copies/mL). A negative result must be combined with clinical observations, patient history, and epidemiological information. The expected result is Negative.  Fact Sheet for Patients:  EntrepreneurPulse.com.au  Fact Sheet for Healthcare Providers:  IncredibleEmployment.be  This test is no t yet approved or cleared by the [place] FDA and  has been authorized for detection and/or diagnosis of SARS-CoV-2 by FDA under an Emergency Use Authorization (EUA). This EUA will remain  in effect (meaning this test can be used) for the duration of the COVID-19 declaration under Section 564(b)(1) of the Act, 21 U.S.C.section 360bbb-3(b)(1), unless the authorization is terminated  or revoked sooner.       Influenza A by PCR  NEGATIVE NEGATIVE   Influenza B by PCR NEGATIVE NEGATIVE    Comment: (NOTE) The Xpert Xpress SARS-CoV-2/FLU/RSV plus assay is intended as an aid in the diagnosis of influenza from Nasopharyngeal swab specimens and should not be used as a sole basis for treatment. Nasal washings and aspirates are unacceptable for Xpert Xpress SARS-CoV-2/FLU/RSV testing.  Fact Sheet for Patients: EntrepreneurPulse.com.au  Fact Sheet for Healthcare Providers: IncredibleEmployment.be  This test is not yet approved or cleared by the [place] FDA and has been authorized for detection and/or diagnosis of SARS-CoV-2 by FDA under an Emergency Use Authorization (EUA). This EUA will remain in effect (meaning this test can be used) for the duration of the COVID-19 declaration under Section 564(b)(1) of the Act, 21 U.S.C. section 360bbb-3(b)(1), unless the authorization is terminated or revoked.  Performed at [facility], [place] [address] [place], [place] [address]   Urine rapid drug screen (hosp performed)     Status: None   Collection Time: 05/03/21  8:54 PM  Result Value Ref Range   Opiates NONE DETECTED NONE DETECTED   Cocaine NONE DETECTED NONE DETECTED   Benzodiazepines NONE DETECTED NONE DETECTED   Amphetamines NONE DETECTED NONE DETECTED   Tetrahydrocannabinol NONE DETECTED NONE DETECTED   Barbiturates NONE DETECTED NONE DETECTED    Comment: (NOTE) DRUG SCREEN FOR MEDICAL PURPOSES ONLY.  IF CONFIRMATION IS NEEDED FOR ANY PURPOSE, NOTIFY LAB WITHIN 5 DAYS.  LOWEST DETECTABLE LIMITS FOR URINE DRUG SCREEN Drug Class                     Cutoff (ng/mL) Amphetamine and metabolites    1000 Barbiturate and metabolites    200 Benzodiazepine                 482 Tricyclics and metabolites     300 Opiates and metabolites        300 Cocaine and metabolites        300 THC  50 Performed at [facility], [place] [address] [place], [place] [address]   Urinalysis, Routine w reflex microscopic Urine, Catheterized     Status: Abnormal   Collection Time: 05/03/21  8:55 PM  Result Value Ref Range   Color, Urine YELLOW YELLOW   APPearance CLEAR CLEAR   Specific Gravity, Urine 1.023 1.005 - 1.030   pH 5.0 5.0 - 8.0   Glucose, UA >=500 (A) NEGATIVE mg/dL   Hgb urine dipstick NEGATIVE NEGATIVE   Bilirubin Urine NEGATIVE NEGATIVE   Ketones, ur NEGATIVE NEGATIVE mg/dL   Protein, ur NEGATIVE NEGATIVE mg/dL   Nitrite NEGATIVE NEGATIVE   Leukocytes,Ua NEGATIVE NEGATIVE   RBC / HPF 0-5 0 - 5 RBC/hpf   WBC, UA 0-5 0 - 5 WBC/hpf   Bacteria, UA NONE SEEN NONE SEEN   Squamous Epithelial / LPF 0-5 0 - 5   Mucus PRESENT    Hyaline Casts, UA PRESENT     Comment: Performed at [facility], [place] [address] [place], [place] [address]  Troponin I (High Sensitivity)     Status: None   Collection Time: 05/03/21  8:55 PM  Result Value Ref Range   Troponin I (High Sensitivity) 6 <18 ng/L    Comment: (NOTE) Elevated high sensitivity troponin I (hsTnI) values and significant  changes across serial measurements may suggest ACS but many other  chronic and acute conditions are known to elevate hsTnI results.  Refer to the "Links" section for chest pain algorithms and additional  guidance. Performed at [facility], [place] [address] [place], [place] [address]     Chemistries  Recent Labs  Lab 05/03/21 1729 05/03/21 1736  NA 132* 133*  K 4.1 4.0  CL 95* 96*  CO2 25  --   GLUCOSE 366* 366*  BUN 29* 27*  CREATININE 1.88* 1.80*  CALCIUM 9.0  --   AST 20  --   ALT 19  --   ALKPHOS 111  --   BILITOT 0.3  --    ------------------------------------------------------------------------------------------------------------------  ------------------------------------------------------------------------------------------------------------------ GFR: Estimated  Creatinine Clearance: 28.1 mL/min (A) (by C-G formula based on SCr of 1.8 mg/dL (H)). Liver Function Tests: Recent Labs  Lab 05/03/21 1729  AST 20  ALT 19  ALKPHOS 111  BILITOT 0.3  PROT 7.5  ALBUMIN 3.6   No results for input(s): LIPASE, AMYLASE in the last 168 hours. No results for input(s): AMMONIA in the last 168 hours. Coagulation Profile: Recent Labs  Lab 05/03/21 1729  INR 1.0   Cardiac Enzymes: No results for input(s): CKTOTAL, CKMB, CKMBINDEX, TROPONINI in the last 168 hours. BNP (last 3 results) No results for input(s): PROBNP in the last 8760 hours. HbA1C: No results for input(s): HGBA1C in the last 72 hours. CBG: No results for input(s): GLUCAP in the last 168 hours. Lipid Profile: No results for input(s): CHOL, HDL, LDLCALC, TRIG, CHOLHDL, LDLDIRECT in the last 72 hours. Thyroid Function Tests: No results for input(s): TSH, T4TOTAL, FREET4, T3FREE, THYROIDAB in the last 72 hours. Anemia Panel: No results for input(s): VITAMINB12, FOLATE, FERRITIN, TIBC, IRON, RETICCTPCT in the last 72 hours.  --------------------------------------------------------------------------------------------------------------- Urine analysis:    Component Value Date/Time   COLORURINE YELLOW 05/03/2021 2055   APPEARANCEUR CLEAR 05/03/2021 2055   LABSPEC 1.023 05/03/2021 2055   PHURINE 5.0 05/03/2021 2055   GLUCOSEU >=500 (A) 05/03/2021 2055   HGBUR NEGATIVE 05/03/2021 2055   BILIRUBINUR NEGATIVE 05/03/2021 2055   BILIRUBINUR neg 09/21/2013 1236  [place] NEGATIVE 05/03/2021 2055   PROTEINUR NEGATIVE 05/03/2021 2055   UROBILINOGEN negative 09/21/2013 1236   NITRITE NEGATIVE 05/03/2021 2055   LEUKOCYTESUR NEGATIVE 05/03/2021 2055      Imaging Results:    CT HEAD WO CONTRAST  Result Date: 05/04/2021 CLINICAL DATA:  Altered mental status EXAM: CT HEAD WITHOUT CONTRAST TECHNIQUE: Contiguous axial images were obtained from the base of the skull through the vertex without  intravenous contrast. COMPARISON:  05/03/2021 FINDINGS: Brain: The previously seen acute acute right thalamic infarct on MRI is again noted. The previously seen smaller bifrontal infarcts not definitively seen by CT. No hemorrhage. No new areas of infarction. Old right basal ganglia lacunar infarct. Chronic small vessel disease throughout the deep white matter. Vascular: No hyperdense vessel or unexpected calcification. Skull: No acute calvarial abnormality. Sinuses/Orbits: No acute findings Other: None IMPRESSION: Acute right thalamic infarct again seen as noted on prior MRI. Other smaller frontal infarcts not visualized by CT. No hemorrhage. Electronically Signed   By: Rolm Baptise M.D.   On: 05/04/2021 03:39   CT HEAD WO CONTRAST  Result Date: 05/03/2021 CLINICAL DATA:  Headache and generalized weakness since last evening. EXAM: CT HEAD WITHOUT CONTRAST TECHNIQUE: Contiguous axial images were obtained from the base of the skull through the vertex without intravenous contrast. COMPARISON:  Head CT 10/26/2020 FINDINGS: Brain: Stable age advanced cerebral atrophy, ventriculomegaly and periventricular white matter disease. Stable remote right basal ganglia infarct with encephalomalacia and ex vacuo dilatation of the frontal horn of the right lateral ventricle. No extra-axial fluid collections are identified. No CT findings for acute hemispheric infarction or intracranial hemorrhage. No mass lesions. The brainstem and cerebellum are normal. Vascular: Vascular calcifications but no aneurysm or hyperdense vessels. Skull: No skull fracture or bone lesions. Sinuses/Orbits: The paranasal sinuses and mastoid air cells are clear. The globes are intact. Other: No scalp lesions or scalp hematoma. IMPRESSION: 1. Stable age advanced cerebral atrophy, ventriculomegaly and periventricular white matter disease. 2. Stable remote right basal ganglia infarct. 3. No acute intracranial findings or mass lesions. Electronically Signed    By: Marijo Sanes M.D.   On: 05/03/2021 18:20   MR Brain Wo Contrast (neuro protocol)  Result Date: 05/04/2021 CLINICAL DATA:  Initial evaluation for neuro deficit, stroke suspected. EXAM: MRI HEAD WITHOUT CONTRAST TECHNIQUE: Multiplanar, multiecho pulse sequences of the brain and surrounding structures were obtained without intravenous contrast. COMPARISON:  Prior CT from earlier the same day. FINDINGS: Brain: Diffuse prominence of the CSF containing spaces compatible generalized age-related cerebral atrophy. Patchy and confluent T2/FLAIR hyperintensity within the periventricular and deep white matter both cerebral hemispheres as well as the pons, most consistent with chronic small vessel ischemic disease, fairly advanced in nature. Few scatter remote lacunar infarcts present about the bilateral basal ganglia with associated chronic hemosiderin staining. Small remote cortical/subcortical infarct at the left occipital lobe. 1.2 cm acute ischemic infarct present at the right ventral medial thalamus. Additional 8 mm acute ischemic cortical/subcortical infarct at the parasagittal posterior right frontal lobe (series 5, image 90). Additional acute to early subacute infarct measuring 1.3 cm present at the left frontal corona radiata (series 5, image 82). No associated hemorrhage or mass effect about these areas of ischemia. Otherwise, gray-white matter differentiation maintained. No other areas of chronic cortical infarction. No acute intracranial hemorrhage. Few additional scattered chronic micro hemorrhages noted, likely small vessel and/or hypertensive in nature. No mass lesion, midline shift or mass effect. Mild ex vacuo dilatation of the right lateral ventricle related  to a chronic right basal ganglia infarct. No hydrocephalus. No extra-axial fluid collection. Pituitary gland suprasellar region normal. Midline structures intact. Vascular: Major intracranial vascular flow voids are maintained. Skull and upper  cervical spine: Craniocervical junction within normal limits. Bone marrow signal intensity normal. No focal marrow replacing lesion. No scalp soft tissue abnormality. Sinuses/Orbits: Patient status post bilateral ocular lens replacement. Globes and orbital soft tissues demonstrate no acute finding. Paranasal sinuses are clear. No mastoid effusion. Inner ear structures grossly normal. Other: None. IMPRESSION: 1. 1.2 cm acute ischemic nonhemorrhagic right thalamic infarct. 2. Additional 8 mm acute ischemic nonhemorrhagic cortical/subcortical infarct involving the parasagittal posterior right frontal lobe. 3. Additional 1.3 cm acute to early subacute ischemic nonhemorrhagic left frontal corona radiata infarct. 4. Underlying age-related cerebral atrophy with advanced chronic small vessel ischemic disease, with a few additional scattered remote lacunar infarcts as above. Electronically Signed   By: Jeannine Boga M.D.   On: 05/04/2021 00:46      Assessment & Plan:    Active Problems:   Hypertension   Diabetes mellitus type 2, controlled, without complications ([place])   AKI (acute kidney injury) ([place])   Acute metabolic encephalopathy   Depression   CVA (cerebral vascular accident) ([place])   Acute metabolic encephalopathy Sedated secondary to Ativan Repeat CT without significant change CVA Presents with headache and generalized weakness MRI brain shows multiple infarcts Continue aspirin Continue statin Hold blood pressure medications for permissive hypertension Transfer to [facility] - Inpatient AKI 500 mL bolus given in the ED Trend in the a.m. Avoid nephrotoxic agents when possible Diabetes mellitus Sliding scale coverage Depression Continue Prozac and Atarax Thyroid disease Continue Synthroid   DVT Prophylaxis-   Lovenox - SCDs   AM Labs Ordered, also please review Full Orders  Family Communication: No family at bedside  Code Status: Full  Admission status: Observation Time spent in  minutes : [place] DO

## 2021-05-04 NOTE — ED Notes (Signed)
Attempted blood draw x 2  

## 2021-05-04 NOTE — Progress Notes (Signed)
  Echocardiogram 2D Echocardiogram has been performed.  Caitlin Harrington Kortlyn Koltz 05/04/2021, 4:48 PM

## 2021-05-05 ENCOUNTER — Inpatient Hospital Stay (HOSPITAL_COMMUNITY): Payer: Medicare Other

## 2021-05-05 DIAGNOSIS — I6389 Other cerebral infarction: Secondary | ICD-10-CM

## 2021-05-05 LAB — URINE CULTURE: Culture: NO GROWTH

## 2021-05-05 LAB — GLUCOSE, CAPILLARY
Glucose-Capillary: 115 mg/dL — ABNORMAL HIGH (ref 70–99)
Glucose-Capillary: 125 mg/dL — ABNORMAL HIGH (ref 70–99)
Glucose-Capillary: 204 mg/dL — ABNORMAL HIGH (ref 70–99)
Glucose-Capillary: 176 mg/dL — ABNORMAL HIGH (ref 70–99)

## 2021-05-05 LAB — HEMOGLOBIN A1C
Hgb A1c MFr Bld: 10.7 % — ABNORMAL HIGH (ref 4.8–5.6)
Mean Plasma Glucose: 260 mg/dL
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
## 2021-05-05 NOTE — Progress Notes (Signed)
  Echocardiogram 2D Echocardiogram limited saline contrast study has been performed.  Janalyn Harder 05/05/2021, 11:52 AM

## 2021-05-05 NOTE — H&P (View-Only) (Signed)
PROGRESS NOTE  KIMONI PICKERILL HQI:[id number] DOB: Apr 24, 1946 DOA: 05/03/2021 PCP: Ileana Ladd, MD   LOS: 1 day   Brief narrative: Georgena Weisheit  is a 75 y.o. female with history of asthma, diabetes mellitus type 2, hypertension, hyperlipidemia and history of CVA presented to hospital with generalized weakness and headache.  Patient had normal vitals in the ED but her BMP showed creatinine of 1.8 and glucose was elevated at 366.  MRI however showed 1.2 cm ischemic infarct in the right thalamic area, 8 mm stroke in the cortical area in 1.3 cm acute or early subacute infarct.  Neurology was consulted and patient was admitted to the hospital for further evaluation and treatment.  Assessment/Plan:  Active Problems:   Hypertension   Diabetes mellitus type 2, controlled, without complications (HCC)   AKI (acute kidney injury) (HCC)   Acute metabolic encephalopathy   Depression   CVA (cerebral vascular accident) (HCC)   Acute CVA (cerebrovascular accident) (HCC)  Acute metabolic encephalopathy Has improved at this time.  Had received Ativan.  Currently stable.  TSH 4.4.  Multifocal CVA Patient presented with headache and generalized weakness.  Continue aspirin and statins.  Follow neurology recommendation.  Neurology recommends transthoracic echo followed by TEE.  Notified cardiology for TEE likely tomorrow.  Hemo-globin A1c of 10.7.  Lipid profile noted with elevated total cholesterol and LDL cholesterol of 145.  Has been seen by physical therapy recommended no physical therapy needs on discharge.  AKI Received IV fluids.  Continue to monitor BMP.  Diabetes mellitus type II.  Continue sliding-scale insulin, Accu-Cheks, diabetic diet.  Hemoglobin A1c of 10.7.  Patient will need good diabetes control.  Depression Continue Prozac and Atarax  Hypothyroidism. Continue Synthroid    DVT prophylaxis: enoxaparin (LOVENOX) injection 30 mg Start: 05/04/21 1000 SCD's Start: 05/04/21  0245    Code Status: Full code  Family Communication: None  Status is: Inpatient  Remains inpatient appropriate because:IV treatments appropriate due to intensity of illness or inability to take PO and Inpatient level of care appropriate due to severity of illness  Dispo: The patient is from: Home              Anticipated d/c is to: Home              Patient currently is not medically stable to d/c.   Difficult to place patient No   Consultants: Neurology   Procedures: TEE pending  Anti-infectives:  None  Anti-infectives (From admission, onward)    None      Subjective: Today, patient was seen and examined at bedside.  Patient states that she feels hungry.  Denies any nausea vomiting fever or chills.  Objective: Vitals:   05/05/21 1357 05/05/21 1405  BP: 122/80 123/80  Pulse: 68 80  Resp: 20 18  Temp: 98.2 F (36.8 C) 98 F (36.7 C)  SpO2: 95% 99%    Intake/Output Summary (Last 24 hours) at 05/05/2021 1529 Last data filed at 05/05/2021 1300 Gross per 24 hour  Intake 240 ml  Output 300 ml  Net -60 ml   Filed Weights   05/03/21 2114  Weight: 89.4 kg   Body mass index is 38.47 kg/m.   Physical Exam: GENERAL: Patient is alert awake and communicative, not in obvious distress.  Obese. HENT: No scleral pallor or icterus. Pupils equally reactive to light. Oral mucosa is moist NECK: is supple, no gross swelling noted. CHEST: Clear to auscultation. No crackles or wheezes.  Diminished breath sounds  bilaterally. CVS: S1 and S2 heard, no murmur. Regular rate and rhythm.  ABDOMEN: Soft, non-tender, bowel sounds are present. EXTREMITIES: No edema. CNS: Mild questionable left facial weakness.  Muscle strength okay. SKIN: warm and dry without rashes.  Data Review: I have personally reviewed the following laboratory data and studies,  CBC: Recent Labs  Lab 05/03/21 1729 05/03/21 1736 05/04/21 0618  WBC 9.9  --  11.6*  NEUTROABS 6.1  --   --   HGB 14.1  14.6 14.1  HCT 42.6 43.0 42.1  MCV 85.4  --  85.2  PLT 300  --  251   Basic Metabolic Panel: Recent Labs  Lab 05/03/21 1729 05/03/21 1736 05/04/21 0618  NA 132* 133* 135  K 4.1 4.0 3.3*  CL 95* 96* 100  CO2 25  --  25  GLUCOSE 366* 366* 188*  BUN 29* 27* 30*  CREATININE 1.88* 1.80* 1.36*  CALCIUM 9.0  --  8.8*   Liver Function Tests: Recent Labs  Lab 05/03/21 1729 05/04/21 0618  AST 20 17  ALT 19 16  ALKPHOS 111 100  BILITOT 0.3 0.4  PROT 7.5 7.3  ALBUMIN 3.6 3.5   No results for input(s): LIPASE, AMYLASE in the last 168 hours. No results for input(s): AMMONIA in the last 168 hours. Cardiac Enzymes: No results for input(s): CKTOTAL, CKMB, CKMBINDEX, TROPONINI in the last 168 hours. BNP (last 3 results) No results for input(s): BNP in the last 8760 hours.  ProBNP (last 3 results) No results for input(s): PROBNP in the last 8760 hours.  CBG: Recent Labs  Lab 05/04/21 1218 05/04/21 1738 05/04/21 2149 05/05/21 0747 05/05/21 1218  GLUCAP 128* 115* 122* 115* 125*   Recent Results (from the past 240 hour(s))  Resp Panel by RT-PCR (Flu A&B, Covid) Nasopharyngeal Swab     Status: None   Collection Time: 05/03/21  8:54 PM   Specimen: Nasopharyngeal Swab; Nasopharyngeal(NP) swabs in vial transport medium  Result Value Ref Range Status   SARS Coronavirus 2 by RT PCR NEGATIVE NEGATIVE Final    Comment: (NOTE) SARS-CoV-2 target nucleic acids are NOT DETECTED.  The SARS-CoV-2 RNA is generally detectable in upper respiratory specimens during the acute phase of infection. The lowest concentration of SARS-CoV-2 viral copies this assay can detect is 138 copies/mL. A negative result does not preclude SARS-Cov-2 infection and should not be used as the sole basis for treatment or other patient management decisions. A negative result may occur with  improper specimen collection/handling, submission of specimen other than nasopharyngeal swab, presence of viral mutation(s)  within the areas targeted by this assay, and inadequate number of viral copies(<138 copies/mL). A negative result must be combined with clinical observations, patient history, and epidemiological information. The expected result is Negative.  Fact Sheet for Patients:  BloggerCourse.com  Fact Sheet for Healthcare Providers:  SeriousBroker.it  This test is no t yet approved or cleared by the [place] FDA and  has been authorized for detection and/or diagnosis of SARS-CoV-2 by FDA under an Emergency Use Authorization (EUA). This EUA will remain  in effect (meaning this test can be used) for the duration of the COVID-19 declaration under Section 564(b)(1) of the Act, 21 U.S.C.section 360bbb-3(b)(1), unless the authorization is terminated  or revoked sooner.       Influenza A by PCR NEGATIVE NEGATIVE Final   Influenza B by PCR NEGATIVE NEGATIVE Final    Comment: (NOTE) The Xpert Xpress SARS-CoV-2/FLU/RSV plus assay is intended as an aid in  the diagnosis of influenza from Nasopharyngeal swab specimens and should not be used as a sole basis for treatment. Nasal washings and aspirates are unacceptable for Xpert Xpress SARS-CoV-2/FLU/RSV testing.  Fact Sheet for Patients: BloggerCourse.com  Fact Sheet for Healthcare Providers: SeriousBroker.it  This test is not yet approved or cleared by the [place] FDA and has been authorized for detection and/or diagnosis of SARS-CoV-2 by FDA under an Emergency Use Authorization (EUA). This EUA will remain in effect (meaning this test can be used) for the duration of the COVID-19 declaration under Section 564(b)(1) of the Act, 21 U.S.C. section 360bbb-3(b)(1), unless the authorization is terminated or revoked.  Performed at [facility], [address] [place], [place] [address]   Culture, Urine     Status: None    Collection Time: 05/03/21  8:55 PM   Specimen: Urine, Catheterized  Result Value Ref Range Status   Specimen Description   Final    URINE, CATHETERIZED Performed at [facility], [address], [place], [place] [address]    Special Requests   Final    NONE Performed at [facility], [address], [place], [place] [address]    Culture   Final    NO GROWTH Performed at [facility], [address], [place], [place] [address]    Report Status 05/05/2021 FINAL  Final     Studies: CT HEAD WO CONTRAST  Result Date: 05/04/2021 CLINICAL DATA:  Mental status change.  Possible stroke. EXAM: CT HEAD WITHOUT CONTRAST TECHNIQUE: Contiguous axial images were obtained from the base of the skull through the vertex without intravenous contrast. COMPARISON:  05/04/2021 at 3:23 a.m., 05/03/2021 as well as MRI 05/03/2021 FINDINGS: Brain: Ventricles, cisterns and other CSF spaces are unchanged. Evidence of patient's known acute right lamina infarct unchanged. Other too small bilateral acute infarcts seen on recent MRI not visualized by CT. Moderate chronic ischemic microvascular disease. Old lacunar infarct over the right lentiform nucleus. No mass, mass effect or shift of midline structures. No acute hemorrhage. Vascular: No hyperdense vessel or unexpected calcification. Skull: Normal. Negative for fracture or focal lesion. Sinuses/Orbits: No acute finding. Other: None. IMPRESSION: 1. Evidence of patient's known acute right thalamus infarct unchanged. Other two small bilateral acute infarcts seen on recent MRI are not visualized by CT. No acute hemorrhage. 2. Moderate chronic ischemic microvascular disease. Old right basal ganglia lacunar infarct. Electronically Signed   By: Elberta Fortis M.D.   On: 05/04/2021 11:46   CT HEAD WO CONTRAST  Result Date: 05/04/2021 CLINICAL DATA:  Altered mental status EXAM: CT HEAD WITHOUT CONTRAST TECHNIQUE: Contiguous axial images  were obtained from the base of the skull through the vertex without intravenous contrast. COMPARISON:  05/03/2021 FINDINGS: Brain: The previously seen acute acute right thalamic infarct on MRI is again noted. The previously seen smaller bifrontal infarcts not definitively seen by CT. No hemorrhage. No new areas of infarction. Old right basal ganglia lacunar infarct. Chronic small vessel disease throughout the deep white matter. Vascular: No hyperdense vessel or unexpected calcification. Skull: No acute calvarial abnormality. Sinuses/Orbits: No acute findings Other: None IMPRESSION: Acute right thalamic infarct again seen as noted on prior MRI. Other smaller frontal infarcts not visualized by CT. No hemorrhage. Electronically Signed   By: Charlett Nose M.D.   On: 05/04/2021 03:39   CT HEAD WO CONTRAST  Result Date: 05/03/2021 CLINICAL DATA:  Headache and generalized weakness since last evening. EXAM: CT HEAD WITHOUT CONTRAST  TECHNIQUE: Contiguous axial images were obtained from the base of the skull through the vertex without intravenous contrast. COMPARISON:  Head CT 10/26/2020 FINDINGS: Brain: Stable age advanced cerebral atrophy, ventriculomegaly and periventricular white matter disease. Stable remote right basal ganglia infarct with encephalomalacia and ex vacuo dilatation of the frontal horn of the right lateral ventricle. No extra-axial fluid collections are identified. No CT findings for acute hemispheric infarction or intracranial hemorrhage. No mass lesions. The brainstem and cerebellum are normal. Vascular: Vascular calcifications but no aneurysm or hyperdense vessels. Skull: No skull fracture or bone lesions. Sinuses/Orbits: The paranasal sinuses and mastoid air cells are clear. The globes are intact. Other: No scalp lesions or scalp hematoma. IMPRESSION: 1. Stable age advanced cerebral atrophy, ventriculomegaly and periventricular white matter disease. 2. Stable remote right basal ganglia infarct. 3.  No acute intracranial findings or mass lesions. Electronically Signed   By: Rudie Meyer M.D.   On: 05/03/2021 18:20   MR ANGIO HEAD WO CONTRAST  Result Date: 05/04/2021 CLINICAL DATA:  Acute strokes on MRI brain EXAM: MRA HEAD WITHOUT CONTRAST MRA NECK WITHOUT CONTRAST TECHNIQUE: Angiographic images of the Circle of Willis were obtained using MRA technique without intravenous contrast. Angiographic images of the neck were obtained using MRA technique without intravenous contrast. Carotid stenosis measurements (when applicable) are obtained utilizing NASCET criteria, using the distal internal carotid diameter as the denominator. COMPARISON:  Correlation made with prior MRI brain 05/03/2021, CTA neck 10/29/2020 FINDINGS: MRA HEAD Motion artifact is present. Intracranial internal carotid arteries are patent with atherosclerotic irregularity. Anterior cerebral arteries are patent. Right A1 ACA is no longer identified launch could be related artifact in the area or new stenosis. Anterior communicating artery is present. Middle cerebral arteries are patent with multifocal atherosclerotic irregularity. Intracranial vertebral arteries, basilar artery, posterior cerebral arteries are patent. Atherosclerotic irregularity of the posterior cerebral arteries. No aneurysm identified. MRA NECK Common, internal, and external carotid arteries are patent. There is no hemodynamically significant stenosis at the ICA origins. Retropharyngeal course. No evidence of dissection. Extracranial vertebral arteries are patent. Origins are not well evaluated due to artifact. No evidence of dissection. IMPRESSION: Suboptimal evaluation due to motion artifact. No large vessel occlusion. No hemodynamically significant stenosis in the neck. Multifocal intracranial atherosclerosis also seen on 2021 CTA. There is no flow seen within the right A1 ACA, which could be related to artifact in the area or new stenosis. Electronically Signed   By:  Guadlupe Spanish M.D.   On: 05/04/2021 14:44   MR ANGIO NECK WO CONTRAST  Result Date: 05/04/2021 CLINICAL DATA:  Acute strokes on MRI brain EXAM: MRA HEAD WITHOUT CONTRAST MRA NECK WITHOUT CONTRAST TECHNIQUE: Angiographic images of the Circle of Willis were obtained using MRA technique without intravenous contrast. Angiographic images of the neck were obtained using MRA technique without intravenous contrast. Carotid stenosis measurements (when applicable) are obtained utilizing NASCET criteria, using the distal internal carotid diameter as the denominator. COMPARISON:  Correlation made with prior MRI brain 05/03/2021, CTA neck 10/29/2020 FINDINGS: MRA HEAD Motion artifact is present. Intracranial internal carotid arteries are patent with atherosclerotic irregularity. Anterior cerebral arteries are patent. Right A1 ACA is no longer identified launch could be related artifact in the area or new stenosis. Anterior communicating artery is present. Middle cerebral arteries are patent with multifocal atherosclerotic irregularity. Intracranial vertebral arteries, basilar artery, posterior cerebral arteries are patent. Atherosclerotic irregularity of the posterior cerebral arteries. No aneurysm identified. MRA NECK Common, internal, and external carotid arteries are patent.  There is no hemodynamically significant stenosis at the ICA origins. Retropharyngeal course. No evidence of dissection. Extracranial vertebral arteries are patent. Origins are not well evaluated due to artifact. No evidence of dissection. IMPRESSION: Suboptimal evaluation due to motion artifact. No large vessel occlusion. No hemodynamically significant stenosis in the neck. Multifocal intracranial atherosclerosis also seen on 2021 CTA. There is no flow seen within the right A1 ACA, which could be related to artifact in the area or new stenosis. Electronically Signed   By: Guadlupe Spanish M.D.   On: 05/04/2021 14:44   MR Brain Wo Contrast (neuro  protocol)  Result Date: 05/04/2021 CLINICAL DATA:  Initial evaluation for neuro deficit, stroke suspected. EXAM: MRI HEAD WITHOUT CONTRAST TECHNIQUE: Multiplanar, multiecho pulse sequences of the brain and surrounding structures were obtained without intravenous contrast. COMPARISON:  Prior CT from earlier the same day. FINDINGS: Brain: Diffuse prominence of the CSF containing spaces compatible generalized age-related cerebral atrophy. Patchy and confluent T2/FLAIR hyperintensity within the periventricular and deep white matter both cerebral hemispheres as well as the pons, most consistent with chronic small vessel ischemic disease, fairly advanced in nature. Few scatter remote lacunar infarcts present about the bilateral basal ganglia with associated chronic hemosiderin staining. Small remote cortical/subcortical infarct at the left occipital lobe. 1.2 cm acute ischemic infarct present at the right ventral medial thalamus. Additional 8 mm acute ischemic cortical/subcortical infarct at the parasagittal posterior right frontal lobe (series 5, image 90). Additional acute to early subacute infarct measuring 1.3 cm present at the left frontal corona radiata (series 5, image 82). No associated hemorrhage or mass effect about these areas of ischemia. Otherwise, gray-white matter differentiation maintained. No other areas of chronic cortical infarction. No acute intracranial hemorrhage. Few additional scattered chronic micro hemorrhages noted, likely small vessel and/or hypertensive in nature. No mass lesion, midline shift or mass effect. Mild ex vacuo dilatation of the right lateral ventricle related to a chronic right basal ganglia infarct. No hydrocephalus. No extra-axial fluid collection. Pituitary gland suprasellar region normal. Midline structures intact. Vascular: Major intracranial vascular flow voids are maintained. Skull and upper cervical spine: Craniocervical junction within normal limits. Bone marrow signal  intensity normal. No focal marrow replacing lesion. No scalp soft tissue abnormality. Sinuses/Orbits: Patient status post bilateral ocular lens replacement. Globes and orbital soft tissues demonstrate no acute finding. Paranasal sinuses are clear. No mastoid effusion. Inner ear structures grossly normal. Other: None. IMPRESSION: 1. 1.2 cm acute ischemic nonhemorrhagic right thalamic infarct. 2. Additional 8 mm acute ischemic nonhemorrhagic cortical/subcortical infarct involving the parasagittal posterior right frontal lobe. 3. Additional 1.3 cm acute to early subacute ischemic nonhemorrhagic left frontal corona radiata infarct. 4. Underlying age-related cerebral atrophy with advanced chronic small vessel ischemic disease, with a few additional scattered remote lacunar infarcts as above. Electronically Signed   By: Rise Mu M.D.   On: 05/04/2021 00:46   ECHOCARDIOGRAM COMPLETE  Result Date: 05/04/2021    ECHOCARDIOGRAM REPORT   Patient Name:   RAJAH LAMBA Kruckenberg Date of Exam: 05/04/2021 Medical Rec #:  [id number]       Height:       60.0 in Accession #:    [id number]      Weight:       197.0 lb Date of Birth:  07-09-1946       BSA:          1.855 m Patient Age:    74 years        BP:  157/81 mmHg Patient Gender: F               HR:           62 bpm. Exam Location:  Inpatient Procedure: 2D Echo, Color Doppler and Cardiac Doppler Indications:    Stroke I63.9  History:        Patient has prior history of Echocardiogram examinations, most                 recent 10/28/2020. Risk Factors:Hypertension and Diabetes.                 COVID-19.  Sonographer:    Tiffany Dance Referring Phys: [id number] ASIA B ZIERLE-GHOSH IMPRESSIONS  1. Left ventricular ejection fraction, by estimation, is 60 to 65%. The left ventricle has normal function. The left ventricle has no regional wall motion abnormalities. Left ventricular diastolic parameters were normal.  2. Right ventricular systolic function is normal. The right  ventricular size is normal.  3. The mitral valve is normal in structure. Trivial mitral valve regurgitation. No evidence of mitral stenosis.  4. The aortic valve is tricuspid. There is mild calcification of the aortic valve. Aortic valve regurgitation is not visualized. Mild aortic valve sclerosis is present, with no evidence of aortic valve stenosis.  5. The inferior vena cava is normal in size with greater than 50% respiratory variability, suggesting right atrial pressure of 3 mmHg. FINDINGS  Left Ventricle: Left ventricular ejection fraction, by estimation, is 60 to 65%. The left ventricle has normal function. The left ventricle has no regional wall motion abnormalities. The left ventricular internal cavity size was normal in size. There is  no left ventricular hypertrophy. Left ventricular diastolic parameters were normal. Right Ventricle: The right ventricular size is normal. No increase in right ventricular wall thickness. Right ventricular systolic function is normal. Left Atrium: Left atrial size was normal in size. Right Atrium: Right atrial size was normal in size. Pericardium: There is no evidence of pericardial effusion. Mitral Valve: The mitral valve is normal in structure. Trivial mitral valve regurgitation. No evidence of mitral valve stenosis. Tricuspid Valve: The tricuspid valve is normal in structure. Tricuspid valve regurgitation is not demonstrated. No evidence of tricuspid stenosis. Aortic Valve: The aortic valve is tricuspid. There is mild calcification of the aortic valve. Aortic valve regurgitation is not visualized. Mild aortic valve sclerosis is present, with no evidence of aortic valve stenosis. Pulmonic Valve: The pulmonic valve was normal in structure. Pulmonic valve regurgitation is not visualized. No evidence of pulmonic stenosis. Aorta: The aortic root is normal in size and structure. Venous: The inferior vena cava is normal in size with greater than 50% respiratory variability,  suggesting right atrial pressure of 3 mmHg. IAS/Shunts: No atrial level shunt detected by color flow Doppler.  LEFT VENTRICLE PLAX 2D LVIDd:         3.50 cm  Diastology LVIDs:         2.30 cm  LV e' medial:    3.81 cm/s LV PW:         1.20 cm  LV E/e' medial:  15.1 LV IVS:        1.10 cm  LV e' lateral:   4.46 cm/s LVOT diam:     1.80 cm  LV E/e' lateral: 12.9 LV SV:         49 LV SV Index:   27 LVOT Area:     2.54 cm  RIGHT VENTRICLE  IVC RV Basal diam:  2.60 cm     IVC diam: 1.20 cm RV S prime:     12.90 cm/s TAPSE (M-mode): 1.8 cm LEFT ATRIUM             Index       RIGHT ATRIUM          Index LA diam:        2.70 cm 1.46 cm/m  RA Area:     7.49 cm LA Vol (A2C):   34.0 ml 18.33 ml/m RA Volume:   12.20 ml 6.58 ml/m LA Vol (A4C):   21.4 ml 11.54 ml/m LA Biplane Vol: 27.1 ml 14.61 ml/m  AORTIC VALVE LVOT Vmax:   85.70 cm/s LVOT Vmean:  54.800 cm/s LVOT VTI:    0.194 m  AORTA Ao Root diam: 3.00 cm Ao Asc diam:  3.00 cm MITRAL VALVE MV Area (PHT): 2.91 cm    SHUNTS MV Decel Time: 261 msec    Systemic VTI:  0.19 m MV E velocity: 57.50 cm/s  Systemic Diam: 1.80 cm MV A velocity: 86.90 cm/s MV E/A ratio:  0.66 Charlton Haws MD Electronically signed by Charlton Haws MD Signature Date/Time: 05/04/2021/4:52:22 PM    Final    ECHOCARDIOGRAM LIMITED BUBBLE STUDY  Result Date: 05/05/2021    ECHOCARDIOGRAM LIMITED REPORT   Patient Name:   NIAH HEINLE Hensch Date of Exam: 05/05/2021 Medical Rec #:  [id number]       Height:       60.0 in Accession #:    [id number]      Weight:       197.0 lb Date of Birth:  [birth year]       BSA:          1.855 m Patient Age:    74 years        BP:           148/71 mmHg Patient Gender: F               HR:           62 bpm. Exam Location:  Inpatient Procedure: Saline Contrast Bubble Study and Limited Echo Indications:    Stroke  History:        Patient has prior history of Echocardiogram examinations, most                 recent 05/04/2021. Stroke, Signs/Symptoms:Altered Mental  Status;                 Risk Factors:Hypertension and Diabetes.  Sonographer:    Sheralyn Boatman RDCS Referring Phys: [id number] [facility]  Sonographer Comments: Technically difficult study due to poor echo windows and patient is morbidly obese. Image acquisition challenging due to patient body habitus. Limited bubble study for stroke. IMPRESSIONS  1. Limited study to evaluate for intracardiac shunt.  2. Agitated saline contrast bubble study was negative, with no evidence of any interatrial shunt. Technically difficult study, but no shunting seen on bubble study FINDINGS  Left Ventricle: Left ventricular ejection fraction, by estimation, is 60 to 65%. The left ventricle has normal function. Right Ventricle: Right ventricular systolic function is normal. IAS/Shunts: Agitated saline contrast was given intravenously to evaluate for intracardiac shunting. Agitated saline contrast bubble study was negative, with no evidence of any interatrial shunt. Epifanio Lesches MD Electronically signed by Epifanio Lesches MD Signature Date/Time: 05/05/2021/1:41:16 PM    Final       Joycelyn Das, MD  [facility] 05/05/2021  If 7PM-7AM, please contact night-coverage

## 2021-05-05 NOTE — Progress Notes (Signed)
Images from MRI brain personally reviewed. The strokes are in 3 separate vascular territories, and include a cortically based infarction, which increases the likelihood of a cardioembolic etiology.   After TTE with bubble, would also obtain a TEE. No other changes to recommendations from yesterday's consult note.   Electronically signed: Dr. Caryl Pina

## 2021-05-05 NOTE — Progress Notes (Signed)
    [facility] has been requested to perform a transesophageal echocardiogram on Caitlin Harrington for stroke.  After careful review of history and examination, the risks and benefits of transesophageal echocardiogram have been explained including risks of esophageal damage, perforation (1:10,000 risk), bleeding, pharyngeal hematoma as well as other potential complications associated with conscious sedation including aspiration, arrhythmia, respiratory failure and death. Alternatives to treatment were discussed, questions were answered. Patient is willing to proceed.   Pt is scheduled for TEE tomorrow at 12:30 with Dr. Elease Hashimoto. NPO at [place] please.  Marcelino Duster, [place]  05/05/2021 4:43 PM

## 2021-05-05 NOTE — Evaluation (Signed)
Speech Language Pathology Evaluation Patient Details Name: Caitlin Harrington MRN: [id number] DOB: 06/06/1946 Today's Date: 05/05/2021 Time: 1200-1230 SLP Time Calculation (min) (ACUTE ONLY): 30 min  Problem List:  Patient Active Problem List   Diagnosis Date Noted   CVA (cerebral vascular accident) (HCC) 05/04/2021   Acute CVA (cerebrovascular accident) (HCC) 05/04/2021   Pain in joint involving ankle and foot 11/06/2020   Acute renal insufficiency 11/06/2020   Allergic rhinitis 11/06/2020   Arthropathy 11/06/2020   Benign essential hypertension 11/06/2020   Bilateral carpal tunnel syndrome 11/06/2020   Chronic sinusitis 11/06/2020   Diverticulitis of colon 11/06/2020   Eczema 11/06/2020   Headache 11/06/2020   Long term (current) use of insulin (HCC) 11/06/2020   Acute metabolic encephalopathy 11/06/2020   Noncompliance with treatment 11/06/2020   Obstructive sleep apnea syndrome 11/06/2020   Other specified abnormal findings of blood chemistry 11/06/2020   Paresthesias 11/06/2020   Personal history of transient ischemic attack (TIA), and cerebral infarction without residual deficits 11/06/2020   Rash 11/06/2020   Depression 11/06/2020   Skin sensation disturbance 11/06/2020   Subclinical hypothyroidism 11/06/2020   Tick bite 11/06/2020   Tiredness 11/06/2020   Unspecified abnormal finding in specimens from other organs, systems and tissues 11/06/2020   Cerebral thrombosis with cerebral infarction 10/29/2020   Major depressive disorder, recurrent episode, moderate (HCC) 10/27/2020   Allergic reaction 10/26/2020   AKI (acute kidney injury) (HCC) 10/26/2020   Thrush 10/26/2020   Urinary tract infection without hematuria 05/12/2020   Encephalopathy 05/12/2020   Altered mental status    Hyperglycemia    Acute on chronic respiratory failure with hypoxia (HCC) 01/03/2020   COVID-19 12/30/2019   Hyperuricemia    Hypertension 02/18/2011   Prolonged depressive adjustment  reaction 02/18/2011   Obesity 02/18/2011   Asthma, mild persistent 02/18/2011   Vitamin D deficiency 02/18/2011   Hyperlipidemia, unspecified 02/18/2011   Diabetes mellitus type 2, controlled, without complications (HCC) 02/18/2011   Insomnia 02/18/2011   Stress at work 02/18/2011   Past Medical History:  Past Medical History:  Diagnosis Date   Asthma    COVID-19    Depression    Diabetes mellitus without complication (HCC)    Fatigue    Gout    Hypertension    Insomnia    Numbness    Obesity    Paresthesia    Vitamin D deficiency    Past Surgical History:  Past Surgical History:  Procedure Laterality Date   ABDOMINAL HYSTERECTOMY     HPI:  Pt is 75 year old female with prior h/o left parietooccipital lobes infarcts (PCA territory) 10/2020, hypertension, Covid with hospitalization, DM, depression, was brought in for LT facial droop.  MRI revealed ischemic RT thalamic infarct.   Assessment / Plan / Recommendation Clinical Impression  Patient presents with mild cognitive impairment however as per discussion with patient's son after evaluation via phone call, appears that she is at or near baseline since previous CVA. Per chart review and patient's report, she has h/o forgetting to take her medications. SLP adminstered [facility] Mental Status examination) and she received a score of 22/30, placing her in range of 'Mild Neurocognitive Disorder' (scores 21-26). She was oriented x4 and main area of impairment was memory retrieval, though she did also exhibited cognitive processing delays. SLP observed patient to be alert but 'groggy' and with flat affect. SLP spoke with patient's son Carolyne Littles on phone after evaluation and he confirmed that since previous CVA, patient has  not been capable of adequately caring for herself or her home. SLP suspects that patient is not far from baseline and so no further services recommended. SLP is recommending that patient have 24  hour supervision and assistance.    SLP Assessment  SLP Recommendation/Assessment: Patient does not need any further Speech Lanaguage Pathology Services SLP Visit Diagnosis: Cognitive communication deficit (R41.841)    Follow Up Recommendations  24 hour supervision/assistance    Frequency and Duration           SLP Evaluation Cognition  Overall Cognitive Status: History of cognitive impairments - at baseline Arousal/Alertness: Awake/alert Orientation Level: Oriented X4 Attention: Sustained Sustained Attention: Appears intact Memory: Impaired Memory Impairment: Other (comment);Retrieval deficit (recalled 3/5 words after 2 minute delay, recalled 1/5 words after 5 minute delay) Safety/Judgment: Impaired       Comprehension  Auditory Comprehension Overall Auditory Comprehension: Appears within functional limits for tasks assessed    Expression Expression Primary Mode of Expression: Verbal Verbal Expression Overall Verbal Expression: Appears within functional limits for tasks assessed   Oral / Motor  Oral Motor/Sensory Function Overall Oral Motor/Sensory Function: Mild impairment Facial ROM: Within Functional Limits Facial Symmetry: Abnormal symmetry left Facial Strength: Reduced left Facial Sensation: Within Functional Limits Lingual ROM: Within Functional Limits Lingual Symmetry: Within Functional Limits Lingual Strength: Within Functional Limits Lingual Sensation: Within Functional Limits Velum: Within Functional Limits Mandible: Within Functional Limits                     Angela Nevin, MA, CCC-SLP Speech Therapy

## 2021-05-05 NOTE — Progress Notes (Signed)
PROGRESS NOTE  Caitlin Harrington MRN:[id number] DOB: 12/12/1945 DOA: 05/03/2021 PCP: Wong, Francis P, MD   LOS: 1 day   Brief narrative: Caitlin Harrington  is a 74 y.o. female with history of asthma, diabetes mellitus type 2, hypertension, hyperlipidemia and history of CVA presented to hospital with generalized weakness and headache.  Patient had normal vitals in the ED but her BMP showed creatinine of 1.8 and glucose was elevated at 366.  MRI however showed 1.2 cm ischemic infarct in the right thalamic area, 8 mm stroke in the cortical area in 1.3 cm acute or early subacute infarct.  Neurology was consulted and patient was admitted to the hospital for further evaluation and treatment.  Assessment/Plan:  Active Problems:   Hypertension   Diabetes mellitus type 2, controlled, without complications (HCC)   AKI (acute kidney injury) (HCC)   Acute metabolic encephalopathy   Depression   CVA (cerebral vascular accident) (HCC)   Acute CVA (cerebrovascular accident) (HCC)  Acute metabolic encephalopathy Has improved at this time.  Had received Ativan.  Currently stable.  TSH 4.4.  Multifocal CVA Patient presented with headache and generalized weakness.  Continue aspirin and statins.  Follow neurology recommendation.  Neurology recommends transthoracic echo followed by TEE.  Notified cardiology for TEE likely tomorrow.  Hemo-globin A1c of 10.7.  Lipid profile noted with elevated total cholesterol and LDL cholesterol of 145.  Has been seen by physical therapy recommended no physical therapy needs on discharge.  AKI Received IV fluids.  Continue to monitor BMP.  Diabetes mellitus type II.  Continue sliding-scale insulin, Accu-Cheks, diabetic diet.  Hemoglobin A1c of 10.7.  Patient will need good diabetes control.  Depression Continue Prozac and Atarax  Hypothyroidism. Continue Synthroid    DVT prophylaxis: enoxaparin (LOVENOX) injection 30 mg Start: 05/04/21 1000 SCD's Start: 05/04/21  0245    Code Status: Full code  Family Communication: None  Status is: Inpatient  Remains inpatient appropriate because:IV treatments appropriate due to intensity of illness or inability to take PO and Inpatient level of care appropriate due to severity of illness  Dispo: The patient is from: Home              Anticipated d/c is to: Home              Patient currently is not medically stable to d/c.   Difficult to place patient No   Consultants: Neurology   Procedures: TEE pending  Anti-infectives:  None  Anti-infectives (From admission, onward)    None      Subjective: Today, patient was seen and examined at bedside.  Patient states that she feels hungry.  Denies any nausea vomiting fever or chills.  Objective: Vitals:   05/05/21 1357 05/05/21 1405  BP: 122/80 123/80  Pulse: 68 80  Resp: 20 18  Temp: 98.2 F (36.8 C) 98 F (36.7 C)  SpO2: 95% 99%    Intake/Output Summary (Last 24 hours) at 05/05/2021 1529 Last data filed at 05/05/2021 1300 Gross per 24 hour  Intake 240 ml  Output 300 ml  Net -60 ml   Filed Weights   05/03/21 2114  Weight: 89.4 kg   Body mass index is 38.47 kg/m.   Physical Exam: GENERAL: Patient is alert awake and communicative, not in obvious distress.  Obese. HENT: No scleral pallor or icterus. Pupils equally reactive to light. Oral mucosa is moist NECK: is supple, no gross swelling noted. CHEST: Clear to auscultation. No crackles or wheezes.  Diminished breath sounds   bilaterally. CVS: S1 and S2 heard, no murmur. Regular rate and rhythm.  ABDOMEN: Soft, non-tender, bowel sounds are present. EXTREMITIES: No edema. CNS: Mild questionable left facial weakness.  Muscle strength okay. SKIN: warm and dry without rashes.  Data Review: I have personally reviewed the following laboratory data and studies,  CBC: Recent Labs  Lab 05/03/21 1729 05/03/21 1736 05/04/21 0618  WBC 9.9  --  11.6*  NEUTROABS 6.1  --   --   HGB 14.1  14.6 14.1  HCT 42.6 43.0 42.1  MCV 85.4  --  85.2  PLT 300  --  251   Basic Metabolic Panel: Recent Labs  Lab 05/03/21 1729 05/03/21 1736 05/04/21 0618  NA 132* 133* 135  K 4.1 4.0 3.3*  CL 95* 96* 100  CO2 25  --  25  GLUCOSE 366* 366* 188*  BUN 29* 27* 30*  CREATININE 1.88* 1.80* 1.36*  CALCIUM 9.0  --  8.8*   Liver Function Tests: Recent Labs  Lab 05/03/21 1729 05/04/21 0618  AST 20 17  ALT 19 16  ALKPHOS 111 100  BILITOT 0.3 0.4  PROT 7.5 7.3  ALBUMIN 3.6 3.5   No results for input(s): LIPASE, AMYLASE in the last 168 hours. No results for input(s): AMMONIA in the last 168 hours. Cardiac Enzymes: No results for input(s): CKTOTAL, CKMB, CKMBINDEX, TROPONINI in the last 168 hours. BNP (last 3 results) No results for input(s): BNP in the last 8760 hours.  ProBNP (last 3 results) No results for input(s): PROBNP in the last 8760 hours.  CBG: Recent Labs  Lab 05/04/21 1218 05/04/21 1738 05/04/21 2149 05/05/21 0747 05/05/21 1218  GLUCAP 128* 115* 122* 115* 125*   Recent Results (from the past 240 hour(s))  Resp Panel by RT-PCR (Flu A&B, Covid) Nasopharyngeal Swab     Status: None   Collection Time: 05/03/21  8:54 PM   Specimen: Nasopharyngeal Swab; Nasopharyngeal(NP) swabs in vial transport medium  Result Value Ref Range Status   SARS Coronavirus 2 by RT PCR NEGATIVE NEGATIVE Final    Comment: (NOTE) SARS-CoV-2 target nucleic acids are NOT DETECTED.  The SARS-CoV-2 RNA is generally detectable in upper respiratory specimens during the acute phase of infection. The lowest concentration of SARS-CoV-2 viral copies this assay can detect is 138 copies/mL. A negative result does not preclude SARS-Cov-2 infection and should not be used as the sole basis for treatment or other patient management decisions. A negative result may occur with  improper specimen collection/handling, submission of specimen other than nasopharyngeal swab, presence of viral mutation(s)  within the areas targeted by this assay, and inadequate number of viral copies(<138 copies/mL). A negative result must be combined with clinical observations, patient history, and epidemiological information. The expected result is Negative.  Fact Sheet for Patients:  https://www.fda.gov/media/152166/download  Fact Sheet for Healthcare Providers:  https://www.fda.gov/media/152162/download  This test is no t yet approved or cleared by the [place] FDA and  has been authorized for detection and/or diagnosis of SARS-CoV-2 by FDA under an Emergency Use Authorization (EUA). This EUA will remain  in effect (meaning this test can be used) for the duration of the COVID-19 declaration under Section 564(b)(1) of the Act, 21 U.S.C.section 360bbb-3(b)(1), unless the authorization is terminated  or revoked sooner.       Influenza A by PCR NEGATIVE NEGATIVE Final   Influenza B by PCR NEGATIVE NEGATIVE Final    Comment: (NOTE) The Xpert Xpress SARS-CoV-2/FLU/RSV plus assay is intended as an aid in   the diagnosis of influenza from Nasopharyngeal swab specimens and should not be used as a sole basis for treatment. Nasal washings and aspirates are unacceptable for Xpert Xpress SARS-CoV-2/FLU/RSV testing.  Fact Sheet for Patients: https://www.fda.gov/media/152166/download  Fact Sheet for Healthcare Providers: https://www.fda.gov/media/152162/download  This test is not yet approved or cleared by the [place] FDA and has been authorized for detection and/or diagnosis of SARS-CoV-2 by FDA under an Emergency Use Authorization (EUA). This EUA will remain in effect (meaning this test can be used) for the duration of the COVID-19 declaration under Section 564(b)(1) of the Act, 21 U.S.C. section 360bbb-3(b)(1), unless the authorization is terminated or revoked.  Performed at [facility], [address] [place], [place] [address]   Culture, Urine     Status: None    Collection Time: 05/03/21  8:55 PM   Specimen: Urine, Catheterized  Result Value Ref Range Status   Specimen Description   Final    URINE, CATHETERIZED Performed at [facility], [address] [place], [place] [address]    Special Requests   Final    NONE Performed at [facility], [address] [place], [place] [address]    Culture   Final    NO GROWTH Performed at [facility], [address] , [place] [address]    Report Status 05/05/2021 FINAL  Final     Studies: CT HEAD WO CONTRAST  Result Date: 05/04/2021 CLINICAL DATA:  Mental status change.  Possible stroke. EXAM: CT HEAD WITHOUT CONTRAST TECHNIQUE: Contiguous axial images were obtained from the base of the skull through the vertex without intravenous contrast. COMPARISON:  05/04/2021 at 3:23 a.m., 05/03/2021 as well as MRI 05/03/2021 FINDINGS: Brain: Ventricles, cisterns and other CSF spaces are unchanged. Evidence of patient's known acute right lamina infarct unchanged. Other too small bilateral acute infarcts seen on recent MRI not visualized by CT. Moderate chronic ischemic microvascular disease. Old lacunar infarct over the right lentiform nucleus. No mass, mass effect or shift of midline structures. No acute hemorrhage. Vascular: No hyperdense vessel or unexpected calcification. Skull: Normal. Negative for fracture or focal lesion. Sinuses/Orbits: No acute finding. Other: None. IMPRESSION: 1. Evidence of patient's known acute right thalamus infarct unchanged. Other two small bilateral acute infarcts seen on recent MRI are not visualized by CT. No acute hemorrhage. 2. Moderate chronic ischemic microvascular disease. Old right basal ganglia lacunar infarct. Electronically Signed   By: Daniel  Boyle M.D.   On: 05/04/2021 11:46   CT HEAD WO CONTRAST  Result Date: 05/04/2021 CLINICAL DATA:  Altered mental status EXAM: CT HEAD WITHOUT CONTRAST TECHNIQUE: Contiguous axial images  were obtained from the base of the skull through the vertex without intravenous contrast. COMPARISON:  05/03/2021 FINDINGS: Brain: The previously seen acute acute right thalamic infarct on MRI is again noted. The previously seen smaller bifrontal infarcts not definitively seen by CT. No hemorrhage. No new areas of infarction. Old right basal ganglia lacunar infarct. Chronic small vessel disease throughout the deep white matter. Vascular: No hyperdense vessel or unexpected calcification. Skull: No acute calvarial abnormality. Sinuses/Orbits: No acute findings Other: None IMPRESSION: Acute right thalamic infarct again seen as noted on prior MRI. Other smaller frontal infarcts not visualized by CT. No hemorrhage. Electronically Signed   By: Kevin  Dover M.D.   On: 05/04/2021 03:39   CT HEAD WO CONTRAST  Result Date: 05/03/2021 CLINICAL DATA:  Headache and generalized weakness since last evening. EXAM: CT HEAD WITHOUT CONTRAST   TECHNIQUE: Contiguous axial images were obtained from the base of the skull through the vertex without intravenous contrast. COMPARISON:  Head CT 10/26/2020 FINDINGS: Brain: Stable age advanced cerebral atrophy, ventriculomegaly and periventricular white matter disease. Stable remote right basal ganglia infarct with encephalomalacia and ex vacuo dilatation of the frontal horn of the right lateral ventricle. No extra-axial fluid collections are identified. No CT findings for acute hemispheric infarction or intracranial hemorrhage. No mass lesions. The brainstem and cerebellum are normal. Vascular: Vascular calcifications but no aneurysm or hyperdense vessels. Skull: No skull fracture or bone lesions. Sinuses/Orbits: The paranasal sinuses and mastoid air cells are clear. The globes are intact. Other: No scalp lesions or scalp hematoma. IMPRESSION: 1. Stable age advanced cerebral atrophy, ventriculomegaly and periventricular white matter disease. 2. Stable remote right basal ganglia infarct. 3.  No acute intracranial findings or mass lesions. Electronically Signed   By: P.  Gallerani M.D.   On: 05/03/2021 18:20   MR ANGIO HEAD WO CONTRAST  Result Date: 05/04/2021 CLINICAL DATA:  Acute strokes on MRI brain EXAM: MRA HEAD WITHOUT CONTRAST MRA NECK WITHOUT CONTRAST TECHNIQUE: Angiographic images of the Circle of Willis were obtained using MRA technique without intravenous contrast. Angiographic images of the neck were obtained using MRA technique without intravenous contrast. Carotid stenosis measurements (when applicable) are obtained utilizing NASCET criteria, using the distal internal carotid diameter as the denominator. COMPARISON:  Correlation made with prior MRI brain 05/03/2021, CTA neck 10/29/2020 FINDINGS: MRA HEAD Motion artifact is present. Intracranial internal carotid arteries are patent with atherosclerotic irregularity. Anterior cerebral arteries are patent. Right A1 ACA is no longer identified launch could be related artifact in the area or new stenosis. Anterior communicating artery is present. Middle cerebral arteries are patent with multifocal atherosclerotic irregularity. Intracranial vertebral arteries, basilar artery, posterior cerebral arteries are patent. Atherosclerotic irregularity of the posterior cerebral arteries. No aneurysm identified. MRA NECK Common, internal, and external carotid arteries are patent. There is no hemodynamically significant stenosis at the ICA origins. Retropharyngeal course. No evidence of dissection. Extracranial vertebral arteries are patent. Origins are not well evaluated due to artifact. No evidence of dissection. IMPRESSION: Suboptimal evaluation due to motion artifact. No large vessel occlusion. No hemodynamically significant stenosis in the neck. Multifocal intracranial atherosclerosis also seen on 2021 CTA. There is no flow seen within the right A1 ACA, which could be related to artifact in the area or new stenosis. Electronically Signed   By:  Praneil  Patel M.D.   On: 05/04/2021 14:44   MR ANGIO NECK WO CONTRAST  Result Date: 05/04/2021 CLINICAL DATA:  Acute strokes on MRI brain EXAM: MRA HEAD WITHOUT CONTRAST MRA NECK WITHOUT CONTRAST TECHNIQUE: Angiographic images of the Circle of Willis were obtained using MRA technique without intravenous contrast. Angiographic images of the neck were obtained using MRA technique without intravenous contrast. Carotid stenosis measurements (when applicable) are obtained utilizing NASCET criteria, using the distal internal carotid diameter as the denominator. COMPARISON:  Correlation made with prior MRI brain 05/03/2021, CTA neck 10/29/2020 FINDINGS: MRA HEAD Motion artifact is present. Intracranial internal carotid arteries are patent with atherosclerotic irregularity. Anterior cerebral arteries are patent. Right A1 ACA is no longer identified launch could be related artifact in the area or new stenosis. Anterior communicating artery is present. Middle cerebral arteries are patent with multifocal atherosclerotic irregularity. Intracranial vertebral arteries, basilar artery, posterior cerebral arteries are patent. Atherosclerotic irregularity of the posterior cerebral arteries. No aneurysm identified. MRA NECK Common, internal, and external carotid arteries are patent.   There is no hemodynamically significant stenosis at the ICA origins. Retropharyngeal course. No evidence of dissection. Extracranial vertebral arteries are patent. Origins are not well evaluated due to artifact. No evidence of dissection. IMPRESSION: Suboptimal evaluation due to motion artifact. No large vessel occlusion. No hemodynamically significant stenosis in the neck. Multifocal intracranial atherosclerosis also seen on 2021 CTA. There is no flow seen within the right A1 ACA, which could be related to artifact in the area or new stenosis. Electronically Signed   By: Praneil  Patel M.D.   On: 05/04/2021 14:44   MR Brain Wo Contrast (neuro  protocol)  Result Date: 05/04/2021 CLINICAL DATA:  Initial evaluation for neuro deficit, stroke suspected. EXAM: MRI HEAD WITHOUT CONTRAST TECHNIQUE: Multiplanar, multiecho pulse sequences of the brain and surrounding structures were obtained without intravenous contrast. COMPARISON:  Prior CT from earlier the same day. FINDINGS: Brain: Diffuse prominence of the CSF containing spaces compatible generalized age-related cerebral atrophy. Patchy and confluent T2/FLAIR hyperintensity within the periventricular and deep white matter both cerebral hemispheres as well as the pons, most consistent with chronic small vessel ischemic disease, fairly advanced in nature. Few scatter remote lacunar infarcts present about the bilateral basal ganglia with associated chronic hemosiderin staining. Small remote cortical/subcortical infarct at the left occipital lobe. 1.2 cm acute ischemic infarct present at the right ventral medial thalamus. Additional 8 mm acute ischemic cortical/subcortical infarct at the parasagittal posterior right frontal lobe (series 5, image 90). Additional acute to early subacute infarct measuring 1.3 cm present at the left frontal corona radiata (series 5, image 82). No associated hemorrhage or mass effect about these areas of ischemia. Otherwise, gray-white matter differentiation maintained. No other areas of chronic cortical infarction. No acute intracranial hemorrhage. Few additional scattered chronic micro hemorrhages noted, likely small vessel and/or hypertensive in nature. No mass lesion, midline shift or mass effect. Mild ex vacuo dilatation of the right lateral ventricle related to a chronic right basal ganglia infarct. No hydrocephalus. No extra-axial fluid collection. Pituitary gland suprasellar region normal. Midline structures intact. Vascular: Major intracranial vascular flow voids are maintained. Skull and upper cervical spine: Craniocervical junction within normal limits. Bone marrow signal  intensity normal. No focal marrow replacing lesion. No scalp soft tissue abnormality. Sinuses/Orbits: Patient status post bilateral ocular lens replacement. Globes and orbital soft tissues demonstrate no acute finding. Paranasal sinuses are clear. No mastoid effusion. Inner ear structures grossly normal. Other: None. IMPRESSION: 1. 1.2 cm acute ischemic nonhemorrhagic right thalamic infarct. 2. Additional 8 mm acute ischemic nonhemorrhagic cortical/subcortical infarct involving the parasagittal posterior right frontal lobe. 3. Additional 1.3 cm acute to early subacute ischemic nonhemorrhagic left frontal corona radiata infarct. 4. Underlying age-related cerebral atrophy with advanced chronic small vessel ischemic disease, with a few additional scattered remote lacunar infarcts as above. Electronically Signed   By: Benjamin  McClintock M.D.   On: 05/04/2021 00:46   ECHOCARDIOGRAM COMPLETE  Result Date: 05/04/2021    ECHOCARDIOGRAM REPORT   Patient Name:   Tamitha K Huttner Date of Exam: 05/04/2021 Medical Rec #:  [id number]       Height:       60.0 in Accession #:    [id number]      Weight:       197.0 lb Date of Birth:  10/22/1946       BSA:          1.855 m Patient Age:    74 years        BP:             157/81 mmHg Patient Gender: F               HR:           62 bpm. Exam Location:  Inpatient Procedure: 2D Echo, Color Doppler and Cardiac Doppler Indications:    Stroke I63.9  History:        Patient has prior history of Echocardiogram examinations, most                 recent 10/28/2020. Risk Factors:Hypertension and Diabetes.                 COVID-19.  Sonographer:    Tiffany Dance Referring Phys: [id number] ASIA B ZIERLE-GHOSH IMPRESSIONS  1. Left ventricular ejection fraction, by estimation, is 60 to 65%. The left ventricle has normal function. The left ventricle has no regional wall motion abnormalities. Left ventricular diastolic parameters were normal.  2. Right ventricular systolic function is normal. The right  ventricular size is normal.  3. The mitral valve is normal in structure. Trivial mitral valve regurgitation. No evidence of mitral stenosis.  4. The aortic valve is tricuspid. There is mild calcification of the aortic valve. Aortic valve regurgitation is not visualized. Mild aortic valve sclerosis is present, with no evidence of aortic valve stenosis.  5. The inferior vena cava is normal in size with greater than 50% respiratory variability, suggesting right atrial pressure of 3 mmHg. FINDINGS  Left Ventricle: Left ventricular ejection fraction, by estimation, is 60 to 65%. The left ventricle has normal function. The left ventricle has no regional wall motion abnormalities. The left ventricular internal cavity size was normal in size. There is  no left ventricular hypertrophy. Left ventricular diastolic parameters were normal. Right Ventricle: The right ventricular size is normal. No increase in right ventricular wall thickness. Right ventricular systolic function is normal. Left Atrium: Left atrial size was normal in size. Right Atrium: Right atrial size was normal in size. Pericardium: There is no evidence of pericardial effusion. Mitral Valve: The mitral valve is normal in structure. Trivial mitral valve regurgitation. No evidence of mitral valve stenosis. Tricuspid Valve: The tricuspid valve is normal in structure. Tricuspid valve regurgitation is not demonstrated. No evidence of tricuspid stenosis. Aortic Valve: The aortic valve is tricuspid. There is mild calcification of the aortic valve. Aortic valve regurgitation is not visualized. Mild aortic valve sclerosis is present, with no evidence of aortic valve stenosis. Pulmonic Valve: The pulmonic valve was normal in structure. Pulmonic valve regurgitation is not visualized. No evidence of pulmonic stenosis. Aorta: The aortic root is normal in size and structure. Venous: The inferior vena cava is normal in size with greater than 50% respiratory variability,  suggesting right atrial pressure of 3 mmHg. IAS/Shunts: No atrial level shunt detected by color flow Doppler.  LEFT VENTRICLE PLAX 2D LVIDd:         3.50 cm  Diastology LVIDs:         2.30 cm  LV e' medial:    3.81 cm/s LV PW:         1.20 cm  LV E/e' medial:  15.1 LV IVS:        1.10 cm  LV e' lateral:   4.46 cm/s LVOT diam:     1.80 cm  LV E/e' lateral: 12.9 LV SV:         49 LV SV Index:   27 LVOT Area:     2.54 cm  RIGHT VENTRICLE               IVC RV Basal diam:  2.60 cm     IVC diam: 1.20 cm RV S prime:     12.90 cm/s TAPSE (M-mode): 1.8 cm LEFT ATRIUM             Index       RIGHT ATRIUM          Index LA diam:        2.70 cm 1.46 cm/m  RA Area:     7.49 cm LA Vol (A2C):   34.0 ml 18.33 ml/m RA Volume:   12.20 ml 6.58 ml/m LA Vol (A4C):   21.4 ml 11.54 ml/m LA Biplane Vol: 27.1 ml 14.61 ml/m  AORTIC VALVE LVOT Vmax:   85.70 cm/s LVOT Vmean:  54.800 cm/s LVOT VTI:    0.194 m  AORTA Ao Root diam: 3.00 cm Ao Asc diam:  3.00 cm MITRAL VALVE MV Area (PHT): 2.91 cm    SHUNTS MV Decel Time: 261 msec    Systemic VTI:  0.19 m MV E velocity: 57.50 cm/s  Systemic Diam: 1.80 cm MV A velocity: 86.90 cm/s MV E/A ratio:  0.66 Peter Nishan MD Electronically signed by Peter Nishan MD Signature Date/Time: 05/04/2021/4:52:22 PM    Final    ECHOCARDIOGRAM LIMITED BUBBLE STUDY  Result Date: 05/05/2021    ECHOCARDIOGRAM LIMITED REPORT   Patient Name:   Brigette K Seawood Date of Exam: 05/05/2021 Medical Rec #:  [id number]       Height:       60.0 in Accession #:    [id number]      Weight:       197.0 lb Date of Birth:  10/13/1946       BSA:          1.855 m Patient Age:    74 years        BP:           148/71 mmHg Patient Gender: F               HR:           62 bpm. Exam Location:  Inpatient Procedure: Saline Contrast Bubble Study and Limited Echo Indications:    Stroke  History:        Patient has prior history of Echocardiogram examinations, most                 recent 05/04/2021. Stroke, Signs/Symptoms:Altered Mental  Status;                 Risk Factors:Hypertension and Diabetes.  Sonographer:    Tina West RDCS Referring Phys: [id number] SALMAN KHALIQDINA  Sonographer Comments: Technically difficult study due to poor echo windows and patient is morbidly obese. Image acquisition challenging due to patient body habitus. Limited bubble study for stroke. IMPRESSIONS  1. Limited study to evaluate for intracardiac shunt.  2. Agitated saline contrast bubble study was negative, with no evidence of any interatrial shunt. Technically difficult study, but no shunting seen on bubble study FINDINGS  Left Ventricle: Left ventricular ejection fraction, by estimation, is 60 to 65%. The left ventricle has normal function. Right Ventricle: Right ventricular systolic function is normal. IAS/Shunts: Agitated saline contrast was given intravenously to evaluate for intracardiac shunting. Agitated saline contrast bubble study was negative, with no evidence of any interatrial shunt. Christopher Schumann MD Electronically signed by Christopher Schumann MD Signature Date/Time: 05/05/2021/1:41:16 PM    Final       Alison Breeding, MD  [facility] 05/05/2021    If 7PM-7AM, please contact night-coverage             
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
## 2021-05-06 ENCOUNTER — Inpatient Hospital Stay (HOSPITAL_COMMUNITY): Payer: Medicare Other | Admitting: Certified Registered Nurse Anesthetist

## 2021-05-06 ENCOUNTER — Encounter (HOSPITAL_COMMUNITY)
Admission: EM | Disposition: A | Discharge status: Home / Self Care | Payer: Self-pay | Source: Home / Self Care | Attending: Internal Medicine

## 2021-05-06 ENCOUNTER — Inpatient Hospital Stay (HOSPITAL_COMMUNITY)
Admit: 2021-05-06 | Discharge: 2021-05-06 | Disposition: A | Discharge status: Home / Self Care | Payer: Medicare Other | Attending: Physician Assistant | Admitting: Physician Assistant

## 2021-05-06 ENCOUNTER — Encounter (HOSPITAL_COMMUNITY): Payer: Self-pay | Admitting: Internal Medicine

## 2021-05-06 DIAGNOSIS — I340 Nonrheumatic mitral (valve) insufficiency: Secondary | ICD-10-CM | POA: Diagnosis not present

## 2021-05-06 HISTORY — PX: BUBBLE STUDY: SHX6837

## 2021-05-06 HISTORY — PX: TEE WITHOUT CARDIOVERSION: SHX5443

## 2021-05-06 LAB — GLUCOSE, CAPILLARY
Glucose-Capillary: 189 mg/dL — ABNORMAL HIGH (ref 70–99)
Glucose-Capillary: 157 mg/dL — ABNORMAL HIGH (ref 70–99)
Glucose-Capillary: 280 mg/dL — ABNORMAL HIGH (ref 70–99)
Glucose-Capillary: 253 mg/dL — ABNORMAL HIGH (ref 70–99)

## 2021-05-06 SURGERY — ECHOCARDIOGRAM, TRANSESOPHAGEAL
Anesthesia: Monitor Anesthesia Care

## 2021-05-06 MED ORDER — PROPOFOL 500 MG/50ML IV EMUL
INTRAVENOUS | Status: DC | PRN
  Administered 2021-05-06: 75 ug/kg/min via INTRAVENOUS

## 2021-05-06 MED ORDER — SODIUM CHLORIDE 0.9 % IV SOLN: INTRAVENOUS | Status: DC

## 2021-05-06 MED ORDER — PROPOFOL 10 MG/ML IV BOLUS
INTRAVENOUS | Status: DC | PRN
  Administered 2021-05-06 (×2): 25 mg via INTRAVENOUS

## 2021-05-06 MED ORDER — LIDOCAINE HCL (PF) 2 % IJ SOLN
INTRAMUSCULAR | Status: DC | PRN
  Administered 2021-05-06: 60 mg via INTRADERMAL

## 2021-05-06 NOTE — Progress Notes (Signed)
Notified [facility] of procedure end and need for transport back to [facility]

## 2021-05-06 NOTE — Progress Notes (Signed)
  Echocardiogram Echocardiogram Transesophageal has been performed.  Caitlin Harrington G Caitlin Harrington 05/06/2021, 1:02 PM

## 2021-05-06 NOTE — Anesthesia Postprocedure Evaluation (Signed)
Anesthesia Post Note  Patient: Caitlin Harrington  Procedure(s) Performed: TRANSESOPHAGEAL ECHOCARDIOGRAM (TEE) BUBBLE STUDY     Patient location during evaluation: PACU Anesthesia Type: MAC Level of consciousness: awake and alert Pain management: pain level controlled Vital Signs Assessment: post-procedure vital signs reviewed and stable Respiratory status: spontaneous breathing, nonlabored ventilation and respiratory function stable Cardiovascular status: blood pressure returned to baseline and stable Postop Assessment: no apparent nausea or vomiting Anesthetic complications: no   No notable events documented.  Last Vitals:  Vitals:   05/06/21 1300 05/06/21 1305  BP:  (!) 168/81  Pulse: 66 (!) 58  Resp: 16 13  Temp:    SpO2: 94% 94%    Last Pain:  Vitals:   05/06/21 1305  TempSrc:   PainSc: 0-No pain                 Pervis Hocking

## 2021-05-06 NOTE — Transfer of Care (Signed)
Immediate Anesthesia Transfer of Care Note  Patient: MCKENZEY PARCELL  Procedure(s) Performed: TRANSESOPHAGEAL ECHOCARDIOGRAM (TEE) BUBBLE STUDY  Patient Location: Endoscopy Unit  Anesthesia Type:MAC  Level of Consciousness: drowsy  Airway & Oxygen Therapy: Patient Spontanous Breathing and Patient connected to nasal cannula oxygen  Post-op Assessment: Report given to RN and Post -op Vital signs reviewed and stable  Post vital signs: Reviewed and stable  Last Vitals:  Vitals Value Taken Time  BP    Temp    Pulse 62 05/06/21 1243  Resp 13 05/06/21 1243  SpO2 92 % 05/06/21 1243  Vitals shown include unvalidated device data.  Last Pain:  Vitals:   05/06/21 1200  TempSrc: Temporal  PainSc: 0-No pain         Complications: No notable events documented.

## 2021-05-06 NOTE — Anesthesia Preprocedure Evaluation (Addendum)
Anesthesia Evaluation  Patient identified by MRN, date of birth, ID band Patient awake    Reviewed: Allergy & Precautions, NPO status , Patient's Chart, lab work & pertinent test results, reviewed documented beta blocker date and time   Airway Mallampati: II  TM Distance: >3 FB Neck ROM: Full    Dental no notable dental hx. (+) Teeth Intact, Dental Advisory Given   Pulmonary asthma , sleep apnea ,    Pulmonary exam normal breath sounds clear to auscultation       Cardiovascular hypertension, Pt. on medications and Pt. on home beta blockers Normal cardiovascular exam Rhythm:Regular Rate:Normal     Neuro/Psych  Headaches, PSYCHIATRIC DISORDERS Depression CVA (right thalamic and cortical infarct), Residual Symptoms    GI/Hepatic negative GI ROS, Neg liver ROS,   Endo/Other  diabetes, Type 2, Insulin DependentHypothyroidism Obesity BMI 38  Renal/GU Renal InsufficiencyRenal diseaseCr 1.36  negative genitourinary   Musculoskeletal negative musculoskeletal ROS (+)   Abdominal (+) + obese,   Peds  Hematology negative hematology ROS (+)   Anesthesia Other Findings   Reproductive/Obstetrics negative OB ROS                            Anesthesia Physical Anesthesia Plan  ASA: 3  Anesthesia Plan: MAC   Post-op Pain Management:    Induction:   PONV Risk Score and Plan: 2 and Propofol infusion and TIVA  Airway Management Planned: Natural Airway and Simple Face Mask  Additional Equipment: None  Intra-op Plan:   Post-operative Plan:   Informed Consent: I have reviewed the patients History and Physical, chart, labs and discussed the procedure including the risks, benefits and alternatives for the proposed anesthesia with the patient or authorized representative who has indicated his/her understanding and acceptance.     Dental advisory given  Plan Discussed with: CRNA  Anesthesia Plan  Comments:        Anesthesia Quick Evaluation

## 2021-05-06 NOTE — Interval H&P Note (Signed)
History and Physical Interval Note:  05/06/2021 12:10 PM  Caitlin Harrington  has presented today for surgery, with the diagnosis of stroke.  The various methods of treatment have been discussed with the patient and family. After consideration of risks, benefits and other options for treatment, the patient has consented to  Procedure(s): TRANSESOPHAGEAL ECHOCARDIOGRAM (TEE) (N/A) as a surgical intervention.  The patient's history has been reviewed, patient examined, no change in status, stable for surgery.  I have reviewed the patient's chart and labs.  Questions were answered to the patient's satisfaction.     Kristeen Miss

## 2021-05-06 NOTE — CV Procedure (Signed)
    Transesophageal Echocardiogram Note  Caitlin Harrington [id number] Jul 15, 1946  Procedure: Transesophageal Echocardiogram Indications: stroke   Procedure Details Consent: Obtained Time Out: Verified patient identification, verified procedure, site/side was marked, verified correct patient position, special equipment/implants available, Radiology Safety Procedures followed,  medications/allergies/relevent history reviewed, required imaging and test results available.  Performed  Medications:  During this procedure the patient is administered a  Lidocaine 60 mg IV followed by propofol drip - total of 131 mg IV by CRNA , Allie for  sedation.  The patient's heart rate, blood pressure, and oxygen saturation are monitored continuously during the procedure. The period of conscious sedation is 30  minutes, of which I was present face-to-face 100% of this time.  Left Ventrical:  normal LV function   Mitral Valve: normal MV   Aortic Valve: normal, 3 leaflet valve   Tricuspid Valve: normal   Pulmonic Valve: normla   Left Atrium/ Left atrial appendage: no LAA thrombus   Atrial septum: intact by color doppler and bubble study   Aorta: mild calcified plaque    Complications: No apparent complications Patient did tolerate procedure well.   Vesta Mixer, Montez Hageman., MD, [facility] 05/06/2021, 12:39 PM

## 2021-05-06 NOTE — Anesthesia Procedure Notes (Signed)
Procedure Name: MAC Date/Time: 05/06/2021 12:24 PM Performed by: Colin Benton, CRNA Pre-anesthesia Checklist: Patient identified and Patient being monitored Patient Re-evaluated:Patient Re-evaluated prior to induction Oxygen Delivery Method: Nasal cannula Induction Type: IV induction Airway Equipment and Method: Bite block Placement Confirmation: positive ETCO2 Dental Injury: Teeth and Oropharynx as per pre-operative assessment

## 2021-05-06 NOTE — Progress Notes (Signed)
PROGRESS NOTE  Caitlin Harrington ZOX:[id number] DOB: 1946/06/20 DOA: 05/03/2021 PCP: Ileana LaddWong, Francis P, MD   LOS: 2 days   Brief narrative:  Caitlin Harrington  is a 75 y.o. female with history of asthma, diabetes mellitus type 2, hypertension, hyperlipidemia and history of CVA presented to hospital with generalized weakness and headache.  Patient had normal vitals in the ED but her BMP showed creatinine of 1.8 and glucose was elevated at 366.  MRI however showed 1.2 cm ischemic infarct in the right thalamic area, 8 mm stroke in the cortical area in 1.3 cm acute or early subacute infarct.  Neurology was consulted and patient was admitted to the hospital for further evaluation and treatment.  Assessment/Plan:  Active Problems:   Hypertension   Diabetes mellitus type 2, controlled, without complications (HCC)   AKI (acute kidney injury) (HCC)   Acute metabolic encephalopathy   Depression   CVA (cerebral vascular accident) (HCC)   Acute CVA (cerebrovascular accident) (HCC)  Acute metabolic encephalopathy Has improved at this time.  Had received Ativan.  Currently stable.  TSH 4.4.  Multifocal CVA Patient presented with headache and generalized weakness.  Continue aspirin and statins.  Follow neurology recommendation.  Neurology recommends transthoracic echo followed by TEE.  Notified cardiology for TEE for today.  Hemo-globin A1c of 10.7.  Lipid profile noted with elevated total cholesterol and LDL cholesterol of 145.  Has been seen by physical therapy recommended no physical therapy needs on discharge.  AKI Received IV fluids.  Continue to monitor BMP.  Creatinine has continued to improve.  Diabetes mellitus type II. Continue sliding-scale insulin, Accu-Cheks, diabetic diet.  Hemoglobin A1c of 10.7.  Patient will need good diabetes control.  Depression Continue Prozac and Atarax  Hypothyroidism. Continue Synthroid    DVT prophylaxis: enoxaparin (LOVENOX) injection 30 mg Start: 05/04/21  1000 SCD's Start: 05/04/21 0245    Code Status: Full code  Family Communication: None  Status is: Inpatient  Remains inpatient appropriate because:IV treatments appropriate due to intensity of illness or inability to take PO and Inpatient level of care appropriate due to severity of illness  Dispo: The patient is from: Home              Anticipated d/c is to: Home likely tomorrow.              Patient currently is not medically stable to d/c.   Difficult to place patient No   Consultants: Neurology   Procedures: TEE today  Anti-infectives:  None  Anti-infectives (From admission, onward)    None      Subjective: Today, patient was seen and examined at bedside.  Denies any dizziness lightheadedness shortness of breath cough fever.  Awaiting for TEE  Objective: Vitals:   05/06/21 1422 05/06/21 1423  BP: (!) 160/93 (!) 160/93  Pulse: 66 66  Resp: 19 19  Temp: 97.8 F (36.6 C) 97.8 F (36.6 C)  SpO2:  95%    Intake/Output Summary (Last 24 hours) at 05/06/2021 1523 Last data filed at 05/06/2021 1243 Gross per 24 hour  Intake 340 ml  Output 500 ml  Net -160 ml    Filed Weights   05/03/21 2114 05/06/21 1200  Weight: 89.4 kg 90.7 kg   Body mass index is 39.06 kg/m.   Physical Exam: GENERAL: Patient is alert awake and communicative, not in obvious distress.  Obese. HENT: No scleral pallor or icterus. Pupils equally reactive to light. Oral mucosa is moist NECK: is supple, no gross  swelling noted. CHEST: Clear to auscultation. No crackles or wheezes.  Diminished breath sounds bilaterally. CVS: S1 and S2 heard, no murmur. Regular rate and rhythm.  ABDOMEN: Soft, non-tender, bowel sounds are present. EXTREMITIES: No edema. CNS: Mild questionable left facial weakness.  Muscle strength okay. SKIN: warm and dry without rashes.  Data Review: I have personally reviewed the following laboratory data and studies,  CBC: Recent Labs  Lab 05/03/21 1729  05/03/21 1736 05/04/21 0618  WBC 9.9  --  11.6*  NEUTROABS 6.1  --   --   HGB 14.1 14.6 14.1  HCT 42.6 43.0 42.1  MCV 85.4  --  85.2  PLT 300  --  251    Basic Metabolic Panel: Recent Labs  Lab 05/03/21 1729 05/03/21 1736 05/04/21 0618  NA 132* 133* 135  K 4.1 4.0 3.3*  CL 95* 96* 100  CO2 25  --  25  GLUCOSE 366* 366* 188*  BUN 29* 27* 30*  CREATININE 1.88* 1.80* 1.36*  CALCIUM 9.0  --  8.8*    Liver Function Tests: Recent Labs  Lab 05/03/21 1729 05/04/21 0618  AST 20 17  ALT 19 16  ALKPHOS 111 100  BILITOT 0.3 0.4  PROT 7.5 7.3  ALBUMIN 3.6 3.5    No results for input(s): LIPASE, AMYLASE in the last 168 hours. No results for input(s): AMMONIA in the last 168 hours. Cardiac Enzymes: No results for input(s): CKTOTAL, CKMB, CKMBINDEX, TROPONINI in the last 168 hours. BNP (last 3 results) No results for input(s): BNP in the last 8760 hours.  ProBNP (last 3 results) No results for input(s): PROBNP in the last 8760 hours.  CBG: Recent Labs  Lab 05/05/21 0747 05/05/21 1218 05/05/21 1559 05/05/21 2205 05/06/21 0730  GLUCAP 115* 125* 204* 176* 189*    Recent Results (from the past 240 hour(s))  Resp Panel by RT-PCR (Flu A&B, Covid) Nasopharyngeal Swab     Status: None   Collection Time: 05/03/21  8:54 PM   Specimen: Nasopharyngeal Swab; Nasopharyngeal(NP) swabs in vial transport medium  Result Value Ref Range Status   SARS Coronavirus 2 by RT PCR NEGATIVE NEGATIVE Final    Comment: (NOTE) SARS-CoV-2 target nucleic acids are NOT DETECTED.  The SARS-CoV-2 RNA is generally detectable in upper respiratory specimens during the acute phase of infection. The lowest concentration of SARS-CoV-2 viral copies this assay can detect is 138 copies/mL. A negative result does not preclude SARS-Cov-2 infection and should not be used as the sole basis for treatment or other patient management decisions. A negative result may occur with  improper specimen  collection/handling, submission of specimen other than nasopharyngeal swab, presence of viral mutation(s) within the areas targeted by this assay, and inadequate number of viral copies(<138 copies/mL). A negative result must be combined with clinical observations, patient history, and epidemiological information. The expected result is Negative.  Fact Sheet for Patients:  BloggerCourse.com  Fact Sheet for Healthcare Providers:  SeriousBroker.it  This test is no t yet approved or cleared by the [place] FDA and  has been authorized for detection and/or diagnosis of SARS-CoV-2 by FDA under an Emergency Use Authorization (EUA). This EUA will remain  in effect (meaning this test can be used) for the duration of the COVID-19 declaration under Section 564(b)(1) of the Act, 21 U.S.C.section 360bbb-3(b)(1), unless the authorization is terminated  or revoked sooner.       Influenza A by PCR NEGATIVE NEGATIVE Final   Influenza B by PCR NEGATIVE NEGATIVE  Final    Comment: (NOTE) The Xpert Xpress SARS-CoV-2/FLU/RSV plus assay is intended as an aid in the diagnosis of influenza from Nasopharyngeal swab specimens and should not be used as a sole basis for treatment. Nasal washings and aspirates are unacceptable for Xpert Xpress SARS-CoV-2/FLU/RSV testing.  Fact Sheet for Patients: BloggerCourse.com  Fact Sheet for Healthcare Providers: SeriousBroker.it  This test is not yet approved or cleared by the [place] FDA and has been authorized for detection and/or diagnosis of SARS-CoV-2 by FDA under an Emergency Use Authorization (EUA). This EUA will remain in effect (meaning this test can be used) for the duration of the COVID-19 declaration under Section 564(b)(1) of the Act, 21 U.S.C. section 360bbb-3(b)(1), unless the authorization is terminated or revoked.  Performed at [facility], [address] [place], [place] [address]   Culture, Urine     Status: None   Collection Time: 05/03/21  8:55 PM   Specimen: Urine, Catheterized  Result Value Ref Range Status   Specimen Description   Final    URINE, CATHETERIZED Performed at [facility], [address] [place], [place] [address]    Special Requests   Final    NONE Performed at [facility], [address] [place], [place] [address]    Culture   Final    NO GROWTH Performed at [facility], [address], [place], [place] [address]    Report Status 05/05/2021 FINAL  Final      Studies: ECHOCARDIOGRAM COMPLETE  Result Date: 05/04/2021    ECHOCARDIOGRAM REPORT   Patient Name:   Caitlin Harrington Date of Exam: 05/04/2021 Medical Rec #:  [id number]       Height:       60.0 in Accession #:    [id number]      Weight:       197.0 lb Date of Birth:  [birth year]       BSA:          1.855 m Patient Age:    74 years        BP:           157/81 mmHg Patient Gender: F               HR:           62 bpm. Exam Location:  Inpatient Procedure: 2D Echo, Color Doppler and Cardiac Doppler Indications:    Stroke I63.9  History:        Patient has prior history of Echocardiogram examinations, most                 recent 10/28/2020. Risk Factors:Hypertension and Diabetes.                 COVID-19.  Sonographer:    Tiffany Dance Referring Phys: [id number] ASIA B ZIERLE-GHOSH IMPRESSIONS  1. Left ventricular ejection fraction, by estimation, is 60 to 65%. The left ventricle has normal function. The left ventricle has no regional wall motion abnormalities. Left ventricular diastolic parameters were normal.  2. Right ventricular systolic function is normal. The right ventricular size is normal.  3. The mitral valve is normal in structure. Trivial mitral valve regurgitation. No evidence of mitral stenosis.  4. The aortic valve is tricuspid. There is mild calcification of the aortic  valve. Aortic valve regurgitation is not visualized. Mild aortic valve sclerosis is present, with no evidence of aortic valve stenosis.  5. The inferior vena  cava is normal in size with greater than 50% respiratory variability, suggesting right atrial pressure of 3 mmHg. FINDINGS  Left Ventricle: Left ventricular ejection fraction, by estimation, is 60 to 65%. The left ventricle has normal function. The left ventricle has no regional wall motion abnormalities. The left ventricular internal cavity size was normal in size. There is  no left ventricular hypertrophy. Left ventricular diastolic parameters were normal. Right Ventricle: The right ventricular size is normal. No increase in right ventricular wall thickness. Right ventricular systolic function is normal. Left Atrium: Left atrial size was normal in size. Right Atrium: Right atrial size was normal in size. Pericardium: There is no evidence of pericardial effusion. Mitral Valve: The mitral valve is normal in structure. Trivial mitral valve regurgitation. No evidence of mitral valve stenosis. Tricuspid Valve: The tricuspid valve is normal in structure. Tricuspid valve regurgitation is not demonstrated. No evidence of tricuspid stenosis. Aortic Valve: The aortic valve is tricuspid. There is mild calcification of the aortic valve. Aortic valve regurgitation is not visualized. Mild aortic valve sclerosis is present, with no evidence of aortic valve stenosis. Pulmonic Valve: The pulmonic valve was normal in structure. Pulmonic valve regurgitation is not visualized. No evidence of pulmonic stenosis. Aorta: The aortic root is normal in size and structure. Venous: The inferior vena cava is normal in size with greater than 50% respiratory variability, suggesting right atrial pressure of 3 mmHg. IAS/Shunts: No atrial level shunt detected by color flow Doppler.  LEFT VENTRICLE PLAX 2D LVIDd:         3.50 cm  Diastology LVIDs:         2.30 cm  LV e' medial:    3.81 cm/s LV  PW:         1.20 cm  LV E/e' medial:  15.1 LV IVS:        1.10 cm  LV e' lateral:   4.46 cm/s LVOT diam:     1.80 cm  LV E/e' lateral: 12.9 LV SV:         49 LV SV Index:   27 LVOT Area:     2.54 cm  RIGHT VENTRICLE             IVC RV Basal diam:  2.60 cm     IVC diam: 1.20 cm RV S prime:     12.90 cm/s TAPSE (M-mode): 1.8 cm LEFT ATRIUM             Index       RIGHT ATRIUM          Index LA diam:        2.70 cm 1.46 cm/m  RA Area:     7.49 cm LA Vol (A2C):   34.0 ml 18.33 ml/m RA Volume:   12.20 ml 6.58 ml/m LA Vol (A4C):   21.4 ml 11.54 ml/m LA Biplane Vol: 27.1 ml 14.61 ml/m  AORTIC VALVE LVOT Vmax:   85.70 cm/s LVOT Vmean:  54.800 cm/s LVOT VTI:    0.194 m  AORTA Ao Root diam: 3.00 cm Ao Asc diam:  3.00 cm MITRAL VALVE MV Area (PHT): 2.91 cm    SHUNTS MV Decel Time: 261 msec    Systemic VTI:  0.19 m MV E velocity: 57.50 cm/s  Systemic Diam: 1.80 cm MV A velocity: 86.90 cm/s MV E/A ratio:  0.66 Charlton Haws MD Electronically signed by Charlton Haws MD Signature Date/Time: 05/04/2021/4:52:22 PM    Final    ECHOCARDIOGRAM LIMITED BUBBLE STUDY  Result Date: 05/05/2021    ECHOCARDIOGRAM LIMITED REPORT   Patient Name:   Caitlin Harrington Date of Exam: 05/05/2021 Medical Rec #:  [id number]       Height:       60.0 in Accession #:    [id number]      Weight:       197.0 lb Date of Birth:  06-Nov-1946       BSA:          1.855 m Patient Age:    74 years        BP:           148/71 mmHg Patient Gender: F               HR:           62 bpm. Exam Location:  Inpatient Procedure: Saline Contrast Bubble Study and Limited Echo Indications:    Stroke  History:        Patient has prior history of Echocardiogram examinations, most                 recent 05/04/2021. Stroke, Signs/Symptoms:Altered Mental Status;                 Risk Factors:Hypertension and Diabetes.  Sonographer:    Sheralyn Boatman RDCS Referring Phys: [id number] [facility]  Sonographer Comments: Technically difficult study due to poor echo windows and patient is  morbidly obese. Image acquisition challenging due to patient body habitus. Limited bubble study for stroke. IMPRESSIONS  1. Limited study to evaluate for intracardiac shunt.  2. Agitated saline contrast bubble study was negative, with no evidence of any interatrial shunt. Technically difficult study, but no shunting seen on bubble study FINDINGS  Left Ventricle: Left ventricular ejection fraction, by estimation, is 60 to 65%. The left ventricle has normal function. Right Ventricle: Right ventricular systolic function is normal. IAS/Shunts: Agitated saline contrast was given intravenously to evaluate for intracardiac shunting. Agitated saline contrast bubble study was negative, with no evidence of any interatrial shunt. Epifanio Lesches MD Electronically signed by Epifanio Lesches MD Signature Date/Time: 05/05/2021/1:41:16 PM    Final       Joycelyn Das, MD  [facility] 05/06/2021  If 7PM-7AM, please contact night-coverage

## 2021-05-06 NOTE — Progress Notes (Signed)
Add-on TTE with bubble study results: Agitated saline contrast bubble study was negative, with no evidence  of any interatrial shunt. Technically difficult study, but no shunting seen on bubble study   Will need TEE to complete stroke work up. Please see my prior note describing indication for TEE.   Electronically signed: Dr. Caryl Pina

## 2021-05-07 LAB — GLUCOSE, CAPILLARY
Glucose-Capillary: 214 mg/dL — ABNORMAL HIGH (ref 70–99)
Glucose-Capillary: 214 mg/dL — ABNORMAL HIGH (ref 70–99)

## 2021-05-07 MED ORDER — ROSUVASTATIN CALCIUM 20 MG PO TABS: 20.0000 mg | ORAL_TABLET | Freq: Every day | ORAL | 2 refills | Status: AC

## 2021-05-07 NOTE — TOC Initial Note (Signed)
Transition of Care [facility]) - Initial/Assessment Note    Patient Details  Name: Caitlin Harrington MRN: [id number] Date of Birth: [birth year]  Transition of Care [facility]) CM/SW Contact:    Golda Acre, RN Phone Number: 05/07/2021, 11:21 AM  Clinical Narrative:                 Pt dcd to return to home with son and self care.  Expected Discharge Plan: Home/Self Care Barriers to Discharge: No Barriers Identified   Patient Goals and CMS Choice        Expected Discharge Plan and Services Expected Discharge Plan: Home/Self Care       Living arrangements for the past 2 months: Single Family Home Expected Discharge Date: 05/07/21                                    Prior Living Arrangements/Services Living arrangements for the past 2 months: Single Family Home Lives with:: Self                   Activities of Daily Living Home Assistive Devices/Equipment: None ADL Screening (condition at time of admission) Patient's cognitive ability adequate to safely complete daily activities?: Yes Is the patient deaf or have difficulty hearing?: No Does the patient have difficulty seeing, even when wearing glasses/contacts?: No Does the patient have difficulty concentrating, remembering, or making decisions?: No Patient able to express need for assistance with ADLs?: Yes Does the patient have difficulty dressing or bathing?: No Independently performs ADLs?: Yes (appropriate for developmental age) Does the patient have difficulty walking or climbing stairs?: No Weakness of Legs: Both Weakness of Arms/Hands: Both  Permission Sought/Granted                  Emotional Assessment              Admission diagnosis:  CVA (cerebral vascular accident) (HCC) [I63.9] Acute ischemic stroke (HCC) [I63.9] Acute CVA (cerebrovascular accident) [facility]) [I63.9] Patient Active Problem List   Diagnosis Date Noted   CVA (cerebral vascular accident) (HCC) 05/04/2021   Acute CVA  (cerebrovascular accident) (HCC) 05/04/2021   Pain in joint involving ankle and foot 11/06/2020   Acute renal insufficiency 11/06/2020   Allergic rhinitis 11/06/2020   Arthropathy 11/06/2020   Benign essential hypertension 11/06/2020   Bilateral carpal tunnel syndrome 11/06/2020   Chronic sinusitis 11/06/2020   Diverticulitis of colon 11/06/2020   Eczema 11/06/2020   Headache 11/06/2020   Long term (current) use of insulin (HCC) 11/06/2020   Acute metabolic encephalopathy 11/06/2020   Noncompliance with treatment 11/06/2020   Obstructive sleep apnea syndrome 11/06/2020   Other specified abnormal findings of blood chemistry 11/06/2020   Paresthesias 11/06/2020   Personal history of transient ischemic attack (TIA), and cerebral infarction without residual deficits 11/06/2020   Rash 11/06/2020   Depression 11/06/2020   Skin sensation disturbance 11/06/2020   Subclinical hypothyroidism 11/06/2020   Tick bite 11/06/2020   Tiredness 11/06/2020   Unspecified abnormal finding in specimens from other organs, systems and tissues 11/06/2020   Cerebral thrombosis with cerebral infarction 10/29/2020   Major depressive disorder, recurrent episode, moderate (HCC) 10/27/2020   Allergic reaction 10/26/2020   AKI (acute kidney injury) (HCC) 10/26/2020   Thrush 10/26/2020   Urinary tract infection without hematuria 05/12/2020   Encephalopathy 05/12/2020   Altered mental status    Hyperglycemia    Acute on chronic respiratory  failure with hypoxia (HCC) 01/03/2020   COVID-19 12/30/2019   Hyperuricemia    Hypertension 02/18/2011   Prolonged depressive adjustment reaction 02/18/2011   Obesity 02/18/2011   Asthma, mild persistent 02/18/2011   Vitamin D deficiency 02/18/2011   Hyperlipidemia, unspecified 02/18/2011   Diabetes mellitus type 2, controlled, without complications (HCC) 02/18/2011   Insomnia 02/18/2011   Stress at work 02/18/2011   PCP:  Ileana Ladd, MD Pharmacy:    [facility] [phone] - [place], [place] - [address] AT [facility] [address] [place] [address] Phone: [phone] Fax: [phone]     Social Determinants of Health (SDOH) Interventions    Readmission Risk Interventions No flowsheet data found.

## 2021-05-07 NOTE — Progress Notes (Deleted)
Pt discharged home with all belongings. Discharge education completed. No questions or concerns at this time. Encouraged pt to follow up with PCP if any issues arise following discharge.

## 2021-05-07 NOTE — Discharge Summary (Signed)
Physician Discharge Summary  Caitlin Harrington VZC:[id number] DOB: Dec 07, 1945 DOA: 05/03/2021  PCP: Vernie Shanks, MD  Admit date: 05/03/2021 Discharge date: 05/07/2021  Admitted From: Home  Discharge disposition: Home   Recommendations for Outpatient Follow-Up:   Follow up with your primary care provider in one week.  Check CBC, BMP, magnesium in the next visit  Discharge Diagnosis:   Active Problems:   Hypertension   Diabetes mellitus type 2, controlled, without complications ([place])   AKI (acute kidney injury) ([place])   Acute metabolic encephalopathy   Depression   CVA (cerebral vascular accident) ([place])   Acute CVA (cerebrovascular accident) [facility])   Discharge Condition: Improved.  Diet recommendation:  Carbohydrate-modified.    Wound care: None.  Code status: Full.   History of Present Illness:   Caitlin Harrington  is a 75 y.o. female with history of asthma, diabetes mellitus type 2, hypertension, hyperlipidemia and history of CVA presented to hospital with generalized weakness and headache.  Patient had normal vitals in the ED but her BMP showed creatinine of 1.8 and glucose was elevated at 366.  MRI however showed 1.2 cm ischemic infarct in the right thalamic area, 8 mm stroke in the cortical area in 1.3 cm acute or early subacute infarct.  Neurology was consulted and patient was admitted to the hospital for further evaluation and treatment.   Hospital Course:   Following conditions were addressed during hospitalization as listed below,  Acute metabolic encephalopathy Has improved at this time.   TSH 4.4.   Multifocal CVA Patient presented with headache and generalized weakness.  Continue aspirin and statins.  Follow neurology recommendation.  Status post TEE without any obvious findings..   Hemo-globin A1c of 10.7.  Spoke about lifestyle modification and better control of diabetes.  Lipid profile noted with elevated total cholesterol and LDL cholesterol of 145.  Has  been seen by physical therapy recommended no physical therapy needs on discharge.   AKI Received IV fluids.  Has normalized.  Diabetes mellitus type II. Will need better control as outpatient.  At this point with the patient regarding lifestyle modification and medications, regular follow-up with primary care physician.  Hemoglobin A1c of 10.7.  Patient will need good diabetes control.   Depression Continue Prozac and Atarax   Hypothyroidism. Continue Synthroid  Disposition.  At this time, patient is stable for disposition home with outpatient PCP and neurology follow-up.  Medical Consultants:   Neurology  Procedures:    TEE Subjective:   Today, patient was seen and examined at bedside.  No interval complaints.  No fever chills or rigor.  Discharge Exam:   Vitals:   05/06/21 2114 05/07/21 0602  BP: (!) 178/96 (!) 176/98  Pulse:  66  Resp:  14  Temp:  97.6 F (36.4 C)  SpO2:  99%   Vitals:   05/06/21 1423 05/06/21 2111 05/06/21 2114 05/07/21 0602  BP: (!) 160/93 (!) 177/114 (!) 178/96 (!) 176/98  Pulse: 66 76  66  Resp: _0 Temp: 97.8 F (36.6 C) 98.1 F (36.7 C)  97.6 F (36.4 C)  TempSrc:  Oral  Oral  SpO2: 95% 98%  99%  Weight:      Height:        General: Alert awake, not in obvious distress, obese HENT: pupils equally reacting to light,  No scleral pallor or icterus noted. Oral mucosa is moist.  Chest:  Clear breath sounds.  Diminished breath sounds bilaterally. No crackles or wheezes.  CVS: S1 &S2 heard. No murmur.  Regular rate and rhythm. Abdomen: Soft, nontender, nondistended.  Bowel sounds are heard.   Extremities: No cyanosis, clubbing or edema.  Peripheral pulses are palpable. Psych: Alert, awake and oriented, normal mood CNS: Mild questionable left facial weakness.  Power equal in all extremities.   Skin: Warm and dry.  No rashes noted.  The results of significant diagnostics from this hospitalization (including imaging, microbiology,  ancillary and laboratory) are listed below for reference.     Diagnostic Studies:   ECHO TEE  Result Date: 05/06/2021    TRANSESOPHOGEAL ECHO REPORT   Patient Name:   Caitlin Harrington Date of Exam: 05/06/2021 Medical Rec #:  [id number]       Height:       60.0 in Accession #:    [id number]      Weight:       197.0 lb Date of Birth:  1946-04-01       BSA:          1.855 m Patient Age:    32 years        BP:           174/88 mmHg Patient Gender: F               HR:           71 bpm. Exam Location:  Inpatient Procedure: Transesophageal Echo, Color Doppler and Saline Contrast Bubble Study Indications:     Stroke I63.9  History:         Patient has prior history of Echocardiogram examinations, most                  recent 05/05/2021. Stroke; Risk Factors:Hypertension and                  Diabetes. COVID-19.  Sonographer:     Tiffany Dance Referring Phys:  [id number] Tami Lin [facility] Diagnosing Phys: Mertie Moores MD PROCEDURE: The transesophogeal probe was passed without difficulty through the esophogus of the patient. Sedation performed by different physician. The patient was monitored while under deep sedation. Anesthestetic sedation was provided intravenously by Anesthesiology: 131.54m of Propofol, 620mof Lidocaine. The patient developed no complications during the procedure. IMPRESSIONS  1. Left ventricular ejection fraction, by estimation, is 60 to 65%. The left ventricle has normal function. The left ventricle has no regional wall motion abnormalities.  2. Right ventricular systolic function is normal. The right ventricular size is normal.  3. No left atrial/left atrial appendage thrombus was detected.  4. The mitral valve is grossly normal. Mild mitral valve regurgitation.  5. The aortic valve is normal in structure. Aortic valve regurgitation is trivial. FINDINGS  Left Ventricle: Left ventricular ejection fraction, by estimation, is 60 to 65%. The left ventricle has normal function. The left ventricle has no  regional wall motion abnormalities. The left ventricular internal cavity size was normal in size. Right Ventricle: The right ventricular size is normal. Right vetricular wall thickness was not well visualized. Right ventricular systolic function is normal. Left Atrium: Left atrial size was normal in size. No left atrial/left atrial appendage thrombus was detected. Right Atrium: Right atrial size was normal in size. Pericardium: There is no evidence of pericardial effusion. Mitral Valve: The mitral valve is grossly normal. Mild mitral valve regurgitation. Tricuspid Valve: The tricuspid valve is normal in structure. Tricuspid valve regurgitation is mild. Aortic Valve: The aortic valve is normal in structure. Aortic valve regurgitation is trivial. Pulmonic  Valve: The pulmonic valve was not well visualized. Pulmonic valve regurgitation is not visualized. Aorta: The aortic root and ascending aorta are structurally normal, with no evidence of dilitation. There is minimal (Grade I) plaque. IAS/Shunts: No atrial level shunt detected by color flow Doppler. Agitated saline contrast was given intravenously to evaluate for intracardiac shunting. Mertie Moores MD Electronically signed by Mertie Moores MD Signature Date/Time: 05/06/2021/4:16:32 PM    Final      Labs:   Basic Metabolic Panel: Recent Labs  Lab 05/03/21 1729 05/03/21 1736 05/04/21 0618  NA 132* 133* 135  K 4.1 4.0 3.3*  CL 95* 96* 100  CO2 25  --  25  GLUCOSE 366* 366* 188*  BUN 29* 27* 30*  CREATININE 1.88* 1.80* 1.36*  CALCIUM 9.0  --  8.8*   GFR Estimated Creatinine Clearance: 37.5 mL/min (A) (by C-G formula based on SCr of 1.36 mg/dL (H)). Liver Function Tests: Recent Labs  Lab 05/03/21 1729 05/04/21 0618  AST 20 17  ALT 19 16  ALKPHOS 111 100  BILITOT 0.3 0.4  PROT 7.5 7.3  ALBUMIN 3.6 3.5   No results for input(s): LIPASE, AMYLASE in the last 168 hours. No results for input(s): AMMONIA in the last 168 hours. Coagulation  profile Recent Labs  Lab 05/03/21 1729  INR 1.0    CBC: Recent Labs  Lab 05/03/21 1729 05/03/21 1736 05/04/21 0618  WBC 9.9  --  11.6*  NEUTROABS 6.1  --   --   HGB 14.1 14.6 14.1  HCT 42.6 43.0 42.1  MCV 85.4  --  85.2  PLT 300  --  251   Cardiac Enzymes: No results for input(s): CKTOTAL, CKMB, CKMBINDEX, TROPONINI in the last 168 hours. BNP: Invalid input(s): POCBNP CBG: Recent Labs  Lab 05/06/21 0730 05/06/21 1506 05/06/21 1733 05/06/21 2114 05/07/21 0738  GLUCAP 189* 157* 280* 253* 214*   D-Dimer No results for input(s): DDIMER in the last 72 hours. Hgb A1c No results for input(s): HGBA1C in the last 72 hours. Lipid Profile No results for input(s): CHOL, HDL, LDLCALC, TRIG, CHOLHDL, LDLDIRECT in the last 72 hours. Thyroid function studies Recent Labs    05/04/21 1514  TSH 4.490   Anemia work up No results for input(s): VITAMINB12, FOLATE, FERRITIN, TIBC, IRON, RETICCTPCT in the last 72 hours. Microbiology Recent Results (from the past 240 hour(s))  Resp Panel by RT-PCR (Flu A&B, Covid) Nasopharyngeal Swab     Status: None   Collection Time: 05/03/21  8:54 PM   Specimen: Nasopharyngeal Swab; Nasopharyngeal(NP) swabs in vial transport medium  Result Value Ref Range Status   SARS Coronavirus 2 by RT PCR NEGATIVE NEGATIVE Final    Comment: (NOTE) SARS-CoV-2 target nucleic acids are NOT DETECTED.  The SARS-CoV-2 RNA is generally detectable in upper respiratory specimens during the acute phase of infection. The lowest concentration of SARS-CoV-2 viral copies this assay can detect is 138 copies/mL. A negative result does not preclude SARS-Cov-2 infection and should not be used as the sole basis for treatment or other patient management decisions. A negative result may occur with  improper specimen collection/handling, submission of specimen other than nasopharyngeal swab, presence of viral mutation(s) within the areas targeted by this assay, and  inadequate number of viral copies(<138 copies/mL). A negative result must be combined with clinical observations, patient history, and epidemiological information. The expected result is Negative.  Fact Sheet for Patients:  EntrepreneurPulse.com.au  Fact Sheet for Healthcare Providers:  IncredibleEmployment.be  This test is  no t yet approved or cleared by the [place] and  has been authorized for detection and/or diagnosis of SARS-CoV-2 by FDA under an Emergency Use Authorization (EUA). This EUA will remain  in effect (meaning this test can be used) for the duration of the COVID-19 declaration under Section 564(b)(1) of the Act, 21 U.S.C.section 360bbb-3(b)(1), unless the authorization is terminated  or revoked sooner.       Influenza A by PCR NEGATIVE NEGATIVE Final   Influenza B by PCR NEGATIVE NEGATIVE Final    Comment: (NOTE) The Xpert Xpress SARS-CoV-2/FLU/RSV plus assay is intended as an aid in the diagnosis of influenza from Nasopharyngeal swab specimens and should not be used as a sole basis for treatment. Nasal washings and aspirates are unacceptable for Xpert Xpress SARS-CoV-2/FLU/RSV testing.  Fact Sheet for Patients: EntrepreneurPulse.com.au  Fact Sheet for Healthcare Providers: IncredibleEmployment.be  This test is not yet approved or cleared by the [place] FDA and has been authorized for detection and/or diagnosis of SARS-CoV-2 by FDA under an Emergency Use Authorization (EUA). This EUA will remain in effect (meaning this test can be used) for the duration of the COVID-19 declaration under Section 564(b)(1) of the Act, 21 U.S.C. section 360bbb-3(b)(1), unless the authorization is terminated or revoked.  Performed at [facility], [place] [address] [place], [place] [address]   Culture, Urine     Status: None   Collection Time: 05/03/21  8:55 PM    Specimen: Urine, Catheterized  Result Value Ref Range Status   Specimen Description   Final    URINE, CATHETERIZED Performed at [place] [address] [place], [place] [address]    Special Requests   Final    NONE Performed at [facility], [place] [address] [place], [place] [address]    Culture   Final    NO GROWTH Performed at [facility], [place] [address] [place], [place] [address]    Report Status 05/05/2021 FINAL  Final     Discharge Instructions:   Discharge Instructions     Ambulatory referral to Neurology   Complete by: As directed    Stroke followup   Diet Carb Modified   Complete by: As directed    Discharge instructions   Complete by: As directed    Please follow-up with your primary care physician in 1 week.  Follow-up with [facility] as scheduled by the clinic.   Increase activity slowly   Complete by: As directed       Allergies as of 05/07/2021       Reactions   Amlodipine    Other reaction(s): headache   Doxycycline    Other reaction(s): rash   Motrin [ibuprofen] Hives   Atorvastatin Hives, Rash   chills   Meloxicam Hives, Rash   Statins Hives, Rash        Medication List     STOP taking these medications    methylPREDNISolone 4 MG Tbpk tablet Commonly known as: MEDROL DOSEPAK   pravastatin 10 MG tablet Commonly known as: PRAVACHOL       TAKE these medications    albuterol 108 (90 Base) MCG/ACT inhaler Commonly known as: VENTOLIN HFA 2 puffs as needed   allopurinol 300 MG tablet Commonly known as: ZYLOPRIM Take 300 mg by mouth daily.   amLODipine 5 MG tablet Commonly known as: NORVASC 1 tablet   aspirin 81 MG EC tablet Take 1 tablet (81 mg total) by mouth  daily. Swallow whole.   Benadryl Allergy 25 MG tablet Generic drug: diphenhydrAMINE 1 tablet at bedtime as needed   blood glucose meter kit and supplies Kit Dispense based on patient and insurance  preference. Use up to four times daily as directed. (FOR ICD-9 250.00, 250.01).   Blood Pressure Kit Kit See admin instructions.   carvedilol 6.25 MG tablet Commonly known as: COREG Take 6.25 mg by mouth 2 (two) times daily with a meal.   colchicine 0.6 MG tablet See admin instructions.   econazole nitrate 1 % cream Apply 1 application topically 2 (two) times daily.   Flonase Allergy Relief 50 MCG/ACT nasal spray Generic drug: fluticasone 2 spray in each nostril   FLUoxetine 10 MG tablet Commonly known as: PROZAC Take 10 mg by mouth daily.   FreeStyle Libre 14 Day Sensor Misc USE TO MONITOR BLOOD SUGAR DAILY AS DIRECTED   FreeStyle [employer] Misc See admin instructions.   glipiZIDE 5 MG tablet Commonly known as: GLUCOTROL 1 tablet   hydrOXYzine 25 MG tablet Commonly known as: ATARAX/VISTARIL 1 tablet as needed   insulin glargine 100 UNIT/ML Solostar Pen Commonly known as: LANTUS Inject into the skin daily.   Insulin Pen Needle 30G X 8 MM Misc Commonly known as: NOVOFINE Inject 10 each into the skin as directed.   Levothyroxine Sodium 25 MCG Caps 1 capsule on an empty stomach in the morning   linagliptin 5 MG Tabs tablet Commonly known as: TRADJENTA Take 1 tablet (5 mg total) by mouth daily.   lisinopril 20 MG tablet Commonly known as: ZESTRIL Take 20 mg by mouth daily.   losartan 100 MG tablet Commonly known as: COZAAR 1 tablet   metFORMIN 500 MG tablet Commonly known as: GLUCOPHAGE 1 tablet with a meal   PARoxetine 40 MG tablet Commonly known as: PAXIL 1 tablet in the morning   rosuvastatin 20 MG tablet Commonly known as: CRESTOR Take 1 tablet (20 mg total) by mouth daily.   sitaGLIPtin 100 MG tablet Commonly known as: JANUVIA Take 100 mg by mouth daily.   terazosin 5 MG capsule Commonly known as: HYTRIN 1 capsule   vitamin B-12 500 MCG tablet Commonly known as: CYANOCOBALAMIN Take 1 tablet (500 mcg total) by mouth daily.    zolpidem 5 MG tablet Commonly known as: AMBIEN 1 tablet        Follow-up Information     Vernie Shanks, MD. Schedule an appointment as soon as possible for a visit in 1 week(s).   Specialty: Family Medicine Contact information: [place] [address] [phone]         [place]. Schedule an appointment as soon as possible for a visit.   Why: stroke followup Contact information: [address]     [place] [address] [phone]                 Time coordinating discharge: 39 minutes  Signed:  Angely Dietz  [facility] 05/07/2021, 8:01 AM

## 2021-05-08 ENCOUNTER — Encounter (HOSPITAL_COMMUNITY): Payer: Self-pay | Admitting: Cardiovascular Disease

## 2021-05-12 DIAGNOSIS — M109 Gout, unspecified: Secondary | ICD-10-CM | POA: Diagnosis not present

## 2021-05-12 DIAGNOSIS — E559 Vitamin D deficiency, unspecified: Secondary | ICD-10-CM | POA: Diagnosis not present

## 2021-05-12 DIAGNOSIS — R899 Unspecified abnormal finding in specimens from other organs, systems and tissues: Secondary | ICD-10-CM | POA: Diagnosis not present

## 2021-05-12 DIAGNOSIS — Z639 Problem related to primary support group, unspecified: Secondary | ICD-10-CM | POA: Diagnosis not present

## 2021-05-12 DIAGNOSIS — Z8673 Personal history of transient ischemic attack (TIA), and cerebral infarction without residual deficits: Secondary | ICD-10-CM | POA: Diagnosis not present

## 2021-05-12 DIAGNOSIS — E1159 Type 2 diabetes mellitus with other circulatory complications: Secondary | ICD-10-CM | POA: Diagnosis not present

## 2021-05-12 DIAGNOSIS — I10 Essential (primary) hypertension: Secondary | ICD-10-CM | POA: Diagnosis not present

## 2021-05-12 DIAGNOSIS — Z794 Long term (current) use of insulin: Secondary | ICD-10-CM | POA: Diagnosis not present

## 2021-05-12 DIAGNOSIS — Z9119 Patient's noncompliance with other medical treatment and regimen: Secondary | ICD-10-CM | POA: Diagnosis not present

## 2021-05-15 DIAGNOSIS — E785 Hyperlipidemia, unspecified: Secondary | ICD-10-CM | POA: Diagnosis not present

## 2021-05-15 DIAGNOSIS — E1159 Type 2 diabetes mellitus with other circulatory complications: Secondary | ICD-10-CM | POA: Diagnosis not present

## 2021-05-15 DIAGNOSIS — I10 Essential (primary) hypertension: Secondary | ICD-10-CM | POA: Diagnosis not present

## 2021-05-15 DIAGNOSIS — E119 Type 2 diabetes mellitus without complications: Secondary | ICD-10-CM | POA: Diagnosis not present

## 2021-05-15 DIAGNOSIS — G4700 Insomnia, unspecified: Secondary | ICD-10-CM | POA: Diagnosis not present

## 2021-05-15 DIAGNOSIS — E039 Hypothyroidism, unspecified: Secondary | ICD-10-CM | POA: Diagnosis not present

## 2021-05-15 DIAGNOSIS — F329 Major depressive disorder, single episode, unspecified: Secondary | ICD-10-CM | POA: Diagnosis not present

## 2021-05-25 ENCOUNTER — Encounter (HOSPITAL_COMMUNITY): Payer: Self-pay | Admitting: *Deleted

## 2021-05-25 ENCOUNTER — Inpatient Hospital Stay (HOSPITAL_COMMUNITY)
Admission: EM | Admit: 2021-05-25 | Discharge: 2021-05-27 | DRG: 689 | Disposition: A | Discharge status: Home Health | Payer: Medicare Other | Attending: Internal Medicine | Admitting: Internal Medicine

## 2021-05-25 ENCOUNTER — Emergency Department (HOSPITAL_COMMUNITY)
Admission: EM | Admit: 2021-05-25 | Discharge: 2021-05-25 | Discharge status: Absent without leave | Payer: Medicare Other

## 2021-05-25 ENCOUNTER — Other Ambulatory Visit: Payer: Self-pay

## 2021-05-25 ENCOUNTER — Emergency Department (HOSPITAL_COMMUNITY): Payer: Medicare Other

## 2021-05-25 DIAGNOSIS — Z8673 Personal history of transient ischemic attack (TIA), and cerebral infarction without residual deficits: Secondary | ICD-10-CM

## 2021-05-25 DIAGNOSIS — R2681 Unsteadiness on feet: Secondary | ICD-10-CM

## 2021-05-25 DIAGNOSIS — Z8616 Personal history of COVID-19: Secondary | ICD-10-CM | POA: Diagnosis not present

## 2021-05-25 DIAGNOSIS — E860 Dehydration: Secondary | ICD-10-CM | POA: Diagnosis not present

## 2021-05-25 DIAGNOSIS — M109 Gout, unspecified: Secondary | ICD-10-CM | POA: Diagnosis present

## 2021-05-25 DIAGNOSIS — E1169 Type 2 diabetes mellitus with other specified complication: Secondary | ICD-10-CM

## 2021-05-25 DIAGNOSIS — N1832 Chronic kidney disease, stage 3b: Secondary | ICD-10-CM | POA: Diagnosis present

## 2021-05-25 DIAGNOSIS — E119 Type 2 diabetes mellitus without complications: Secondary | ICD-10-CM

## 2021-05-25 DIAGNOSIS — J4530 Mild persistent asthma, uncomplicated: Secondary | ICD-10-CM

## 2021-05-25 DIAGNOSIS — E1165 Type 2 diabetes mellitus with hyperglycemia: Secondary | ICD-10-CM | POA: Diagnosis present

## 2021-05-25 DIAGNOSIS — I129 Hypertensive chronic kidney disease with stage 1 through stage 4 chronic kidney disease, or unspecified chronic kidney disease: Secondary | ICD-10-CM | POA: Diagnosis present

## 2021-05-25 DIAGNOSIS — N179 Acute kidney failure, unspecified: Secondary | ICD-10-CM | POA: Diagnosis present

## 2021-05-25 DIAGNOSIS — Z833 Family history of diabetes mellitus: Secondary | ICD-10-CM

## 2021-05-25 DIAGNOSIS — Z794 Long term (current) use of insulin: Secondary | ICD-10-CM | POA: Diagnosis not present

## 2021-05-25 DIAGNOSIS — E11649 Type 2 diabetes mellitus with hypoglycemia without coma: Secondary | ICD-10-CM | POA: Diagnosis not present

## 2021-05-25 DIAGNOSIS — Z90710 Acquired absence of both cervix and uterus: Secondary | ICD-10-CM | POA: Diagnosis not present

## 2021-05-25 DIAGNOSIS — F339 Major depressive disorder, recurrent, unspecified: Secondary | ICD-10-CM | POA: Diagnosis not present

## 2021-05-25 DIAGNOSIS — I639 Cerebral infarction, unspecified: Secondary | ICD-10-CM | POA: Diagnosis not present

## 2021-05-25 DIAGNOSIS — Z7984 Long term (current) use of oral hypoglycemic drugs: Secondary | ICD-10-CM

## 2021-05-25 DIAGNOSIS — R4182 Altered mental status, unspecified: Secondary | ICD-10-CM

## 2021-05-25 DIAGNOSIS — E038 Other specified hypothyroidism: Secondary | ICD-10-CM | POA: Diagnosis not present

## 2021-05-25 DIAGNOSIS — R531 Weakness: Secondary | ICD-10-CM | POA: Diagnosis not present

## 2021-05-25 DIAGNOSIS — Z6839 Body mass index (BMI) 39.0-39.9, adult: Secondary | ICD-10-CM | POA: Diagnosis not present

## 2021-05-25 DIAGNOSIS — Z881 Allergy status to other antibiotic agents status: Secondary | ICD-10-CM

## 2021-05-25 DIAGNOSIS — Z888 Allergy status to other drugs, medicaments and biological substances status: Secondary | ICD-10-CM

## 2021-05-25 DIAGNOSIS — N390 Urinary tract infection, site not specified: Secondary | ICD-10-CM | POA: Diagnosis not present

## 2021-05-25 DIAGNOSIS — E162 Hypoglycemia, unspecified: Secondary | ICD-10-CM | POA: Diagnosis not present

## 2021-05-25 DIAGNOSIS — G4700 Insomnia, unspecified: Secondary | ICD-10-CM | POA: Diagnosis present

## 2021-05-25 DIAGNOSIS — Z79899 Other long term (current) drug therapy: Secondary | ICD-10-CM

## 2021-05-25 DIAGNOSIS — I10 Essential (primary) hypertension: Secondary | ICD-10-CM | POA: Diagnosis not present

## 2021-05-25 DIAGNOSIS — Z20822 Contact with and (suspected) exposure to covid-19: Secondary | ICD-10-CM | POA: Diagnosis present

## 2021-05-25 DIAGNOSIS — G4733 Obstructive sleep apnea (adult) (pediatric): Secondary | ICD-10-CM | POA: Diagnosis present

## 2021-05-25 DIAGNOSIS — E1122 Type 2 diabetes mellitus with diabetic chronic kidney disease: Secondary | ICD-10-CM | POA: Diagnosis not present

## 2021-05-25 DIAGNOSIS — E161 Other hypoglycemia: Secondary | ICD-10-CM | POA: Diagnosis not present

## 2021-05-25 DIAGNOSIS — E785 Hyperlipidemia, unspecified: Secondary | ICD-10-CM | POA: Diagnosis present

## 2021-05-25 DIAGNOSIS — R0689 Other abnormalities of breathing: Secondary | ICD-10-CM | POA: Diagnosis not present

## 2021-05-25 DIAGNOSIS — G9341 Metabolic encephalopathy: Secondary | ICD-10-CM | POA: Diagnosis not present

## 2021-05-25 DIAGNOSIS — Z79890 Hormone replacement therapy (postmenopausal): Secondary | ICD-10-CM

## 2021-05-25 DIAGNOSIS — Z7982 Long term (current) use of aspirin: Secondary | ICD-10-CM | POA: Diagnosis not present

## 2021-05-25 DIAGNOSIS — N183 Chronic kidney disease, stage 3 unspecified: Secondary | ICD-10-CM

## 2021-05-25 DIAGNOSIS — R410 Disorientation, unspecified: Secondary | ICD-10-CM | POA: Diagnosis not present

## 2021-05-25 DIAGNOSIS — Z886 Allergy status to analgesic agent status: Secondary | ICD-10-CM

## 2021-05-25 DIAGNOSIS — R0902 Hypoxemia: Secondary | ICD-10-CM | POA: Diagnosis not present

## 2021-05-25 DIAGNOSIS — N1831 Chronic kidney disease, stage 3a: Secondary | ICD-10-CM

## 2021-05-25 LAB — CBC WITH DIFFERENTIAL/PLATELET
Abs Immature Granulocytes: 0.05 10*3/uL (ref 0.00–0.07)
Basophils Absolute: 0.1 10*3/uL (ref 0.0–0.1)
Basophils Relative: 1 %
Eosinophils Absolute: 0.6 10*3/uL — ABNORMAL HIGH (ref 0.0–0.5)
Eosinophils Relative: 7 %
HCT: 38.3 % (ref 36.0–46.0)
Hemoglobin: 12.8 g/dL (ref 12.0–15.0)
Immature Granulocytes: 1 %
Lymphocytes Relative: 19 %
Lymphs Abs: 1.7 10*3/uL (ref 0.7–4.0)
MCH: 29.2 pg (ref 26.0–34.0)
MCHC: 33.4 g/dL (ref 30.0–36.0)
MCV: 87.2 fL (ref 80.0–100.0)
Monocytes Absolute: 0.8 10*3/uL (ref 0.1–1.0)
Monocytes Relative: 9 %
Neutro Abs: 5.7 10*3/uL (ref 1.7–7.7)
Neutrophils Relative %: 63 %
Platelets: 237 10*3/uL (ref 150–400)
RBC: 4.39 MIL/uL (ref 3.87–5.11)
RDW: 13.2 % (ref 11.5–15.5)
WBC: 8.8 10*3/uL (ref 4.0–10.5)
nRBC: 0 % (ref 0.0–0.2)

## 2021-05-25 LAB — URINALYSIS, ROUTINE W REFLEX MICROSCOPIC
Bilirubin Urine: NEGATIVE
Glucose, UA: NEGATIVE mg/dL
Ketones, ur: NEGATIVE mg/dL
Nitrite: POSITIVE — AB
Protein, ur: NEGATIVE mg/dL
Specific Gravity, Urine: 1.018 (ref 1.005–1.030)
WBC, UA: >50 WBC/hpf — ABNORMAL HIGH (ref 0–5)
pH: 5 (ref 5.0–8.0)

## 2021-05-25 LAB — COMPREHENSIVE METABOLIC PANEL
ALT: 18 U/L (ref 0–44)
AST: 25 U/L (ref 15–41)
Albumin: 3.4 g/dL — ABNORMAL LOW (ref 3.5–5.0)
Alkaline Phosphatase: 79 U/L (ref 38–126)
Anion gap: 9 (ref 5–15)
BUN: 36 mg/dL — ABNORMAL HIGH (ref 8–23)
CO2: 25 mmol/L (ref 22–32)
Calcium: 9.4 mg/dL (ref 8.9–10.3)
Chloride: 102 mmol/L (ref 98–111)
Creatinine, Ser: 1.58 mg/dL — ABNORMAL HIGH (ref 0.44–1.00)
GFR, Estimated: 35 mL/min — ABNORMAL LOW (ref >60)
Glucose, Bld: 73 mg/dL (ref 70–99)
Potassium: 3.9 mmol/L (ref 3.5–5.1)
Sodium: 136 mmol/L (ref 135–145)
Total Bilirubin: 0.4 mg/dL (ref 0.3–1.2)
Total Protein: 6.9 g/dL (ref 6.5–8.1)

## 2021-05-25 LAB — BLOOD GAS, VENOUS
Acid-Base Excess: 0.1 mmol/L (ref 0.0–2.0)
Bicarbonate: 24.5 mmol/L (ref 20.0–28.0)
FIO2: 21
O2 Saturation: 95.6 %
Patient temperature: 36.6
pCO2, Ven: 38.8 mmHg — ABNORMAL LOW (ref 44.0–60.0)
pH, Ven: 7.41 (ref 7.250–7.430)
pO2, Ven: 76.8 mmHg — ABNORMAL HIGH (ref 32.0–45.0)

## 2021-05-25 LAB — TROPONIN I (HIGH SENSITIVITY)
Troponin I (High Sensitivity): 8 ng/L (ref <18)
Troponin I (High Sensitivity): 8 ng/L (ref <18)

## 2021-05-25 LAB — RESP PANEL BY RT-PCR (FLU A&B, COVID) ARPGX2
Influenza A by PCR: NEGATIVE
Influenza B by PCR: NEGATIVE
SARS Coronavirus 2 by RT PCR: NEGATIVE

## 2021-05-25 LAB — GLUCOSE, CAPILLARY
Glucose-Capillary: 80 mg/dL (ref 70–99)
Glucose-Capillary: 130 mg/dL — ABNORMAL HIGH (ref 70–99)

## 2021-05-25 LAB — CBG MONITORING, ED
Glucose-Capillary: 73 mg/dL (ref 70–99)
Glucose-Capillary: 106 mg/dL — ABNORMAL HIGH (ref 70–99)
Glucose-Capillary: 89 mg/dL (ref 70–99)

## 2021-05-25 LAB — AMMONIA: Ammonia: 34 umol/L (ref 9–35)

## 2021-05-25 LAB — RAPID URINE DRUG SCREEN, HOSP PERFORMED
Amphetamines: NOT DETECTED
Barbiturates: NOT DETECTED
Benzodiazepines: NOT DETECTED
Cocaine: NOT DETECTED
Opiates: NOT DETECTED
Tetrahydrocannabinol: NOT DETECTED

## 2021-05-25 LAB — ETHANOL: Alcohol, Ethyl (B): <10 mg/dL (ref <10)

## 2021-05-25 MED ORDER — INSULIN GLARGINE 100 UNIT/ML ~~LOC~~ SOLN
15.0000 [IU] | Freq: Every day | SUBCUTANEOUS | Status: DC
  Filled 2021-05-25 (×2): qty 0.15

## 2021-05-25 MED ORDER — CARVEDILOL 3.125 MG PO TABS
6.2500 mg | ORAL_TABLET | Freq: Two times a day (BID) | ORAL | Status: DC
  Administered 2021-05-25 – 2021-05-27 (×4): 6.25 mg via ORAL
  Filled 2021-05-25 (×4): qty 2

## 2021-05-25 MED ORDER — ACETAMINOPHEN 325 MG PO TABS: 650.0000 mg | ORAL_TABLET | Freq: Four times a day (QID) | ORAL | Status: DC | PRN

## 2021-05-25 MED ORDER — LEVOTHYROXINE SODIUM 25 MCG PO TABS
25.0000 ug | ORAL_TABLET | Freq: Every morning | ORAL | Status: DC
  Administered 2021-05-26 – 2021-05-27 (×2): 25 ug via ORAL
  Filled 2021-05-25 (×2): qty 1

## 2021-05-25 MED ORDER — INSULIN ASPART 100 UNIT/ML IJ SOLN
0.0000 [IU] | Freq: Three times a day (TID) | INTRAMUSCULAR | Status: DC
  Administered 2021-05-26: 3 [IU] via SUBCUTANEOUS
  Administered 2021-05-26: 2 [IU] via SUBCUTANEOUS
  Administered 2021-05-27: 5 [IU] via SUBCUTANEOUS

## 2021-05-25 MED ORDER — TERAZOSIN HCL 5 MG PO CAPS
5.0000 mg | ORAL_CAPSULE | Freq: Every day | ORAL | Status: DC
  Administered 2021-05-26: 5 mg via ORAL
  Filled 2021-05-25: qty 1

## 2021-05-25 MED ORDER — SODIUM CHLORIDE 0.9 % IV SOLN
1.0000 g | INTRAVENOUS | Status: DC
  Administered 2021-05-26: 1 g via INTRAVENOUS
  Filled 2021-05-25: qty 10

## 2021-05-25 MED ORDER — ONDANSETRON HCL 4 MG/2ML IJ SOLN: 4.0000 mg | Freq: Four times a day (QID) | INTRAMUSCULAR | Status: DC | PRN

## 2021-05-25 MED ORDER — LISINOPRIL 10 MG PO TABS: 20.0000 mg | ORAL_TABLET | Freq: Every day | ORAL | Status: DC

## 2021-05-25 MED ORDER — LACTATED RINGERS IV SOLN: INTRAVENOUS | Status: DC

## 2021-05-25 MED ORDER — LINAGLIPTIN 5 MG PO TABS
5.0000 mg | ORAL_TABLET | Freq: Every day | ORAL | Status: DC
  Administered 2021-05-26 – 2021-05-27 (×2): 5 mg via ORAL
  Filled 2021-05-25 (×2): qty 1

## 2021-05-25 MED ORDER — ALLOPURINOL 300 MG PO TABS
300.0000 mg | ORAL_TABLET | Freq: Every day | ORAL | Status: DC
  Administered 2021-05-26 – 2021-05-27 (×2): 300 mg via ORAL
  Filled 2021-05-25 (×2): qty 1

## 2021-05-25 MED ORDER — AMLODIPINE BESYLATE 5 MG PO TABS
5.0000 mg | ORAL_TABLET | Freq: Every day | ORAL | Status: DC
  Administered 2021-05-26 – 2021-05-27 (×2): 5 mg via ORAL
  Filled 2021-05-25 (×2): qty 1

## 2021-05-25 MED ORDER — SODIUM CHLORIDE 0.9 % IV SOLN
1.0000 g | Freq: Once | INTRAVENOUS | Status: AC
  Administered 2021-05-25: 1 g via INTRAVENOUS
  Filled 2021-05-25: qty 10

## 2021-05-25 MED ORDER — ROSUVASTATIN CALCIUM 20 MG PO TABS
20.0000 mg | ORAL_TABLET | Freq: Every evening | ORAL | Status: DC
  Administered 2021-05-25 – 2021-05-26 (×2): 20 mg via ORAL
  Filled 2021-05-25 (×2): qty 1

## 2021-05-25 MED ORDER — ALBUTEROL SULFATE (2.5 MG/3ML) 0.083% IN NEBU: 3.0000 mL | INHALATION_SOLUTION | RESPIRATORY_TRACT | Status: DC | PRN

## 2021-05-25 MED ORDER — ACETAMINOPHEN 650 MG RE SUPP: 650.0000 mg | Freq: Four times a day (QID) | RECTAL | Status: DC | PRN

## 2021-05-25 MED ORDER — ASPIRIN EC 81 MG PO TBEC
81.0000 mg | DELAYED_RELEASE_TABLET | Freq: Every day | ORAL | Status: DC
  Administered 2021-05-26 – 2021-05-27 (×2): 81 mg via ORAL
  Filled 2021-05-25 (×2): qty 1

## 2021-05-25 MED ORDER — ONDANSETRON HCL 4 MG PO TABS
4.0000 mg | ORAL_TABLET | Freq: Four times a day (QID) | ORAL | Status: DC | PRN
Start: 2021-05-25 — End: 2021-05-27

## 2021-05-25 MED ORDER — ENOXAPARIN SODIUM 40 MG/0.4ML IJ SOSY
40.0000 mg | PREFILLED_SYRINGE | INTRAMUSCULAR | Status: DC
  Administered 2021-05-25 – 2021-05-26 (×2): 40 mg via SUBCUTANEOUS
  Filled 2021-05-25 (×2): qty 0.4

## 2021-05-25 MED ORDER — INSULIN ASPART 100 UNIT/ML IJ SOLN: 0.0000 [IU] | Freq: Every day | INTRAMUSCULAR | Status: DC

## 2021-05-25 NOTE — H&P (Signed)
History and Physical  Caitlin Harrington VCB:[id number] DOB: 1946/03/10 DOA: 05/25/2021  Referring physician: Dr Melina Copa, ED physician PCP: Vernie Shanks, MD  Outpatient Specialists: none  Patient Coming From: home  Chief Complaint: confusion  HPI: Caitlin Harrington is a 75 y.o. female with a history of Diabetes, depression, hypertension, Obesity, and recent CVA.  Patient brought in by EMS for confusion that is been going on all day.  Her blood sugar was low, so she was given Lantus and candy.  On EMS arrival, her CBG was 68.  She was given oral glucose.  On recheck her CBG was 165 and was down to 106 on arrival to the emergency department.  Patient feels confused and unable to report any palliating or provoking factors.  She denies hallucinations, unilateral weakness,, chills, nausea, vomiting.  She denies headache.  Per EDP, her symptoms appear somewhat improved than when she first arrived.  Emergency Department Course: White count 8.8 with a normal troponin.  Creatinine 1.58, which appears to be her baseline.  COVID-negative.  Blood gas shows slightly elevated PO2.  Review of Systems:   Pt complains of suprapubic discomfort with frequency.  Pt denies any fevers, chills, nausea, vomiting, diarrhea, constipation, abdominal pain, shortness of breath, dyspnea on exertion, orthopnea, cough, wheezing, palpitations, headache, vision changes, lightheadedness, dizziness, melena, rectal bleeding.  Review of systems are otherwise negative  Past Medical History:  Diagnosis Date   Asthma    COVID-19    Depression    Diabetes mellitus without complication ([place])    Fatigue    Gout    Hypertension    Insomnia    Numbness    Obesity    Paresthesia    Vitamin D deficiency    Past Surgical History:  Procedure Laterality Date   ABDOMINAL HYSTERECTOMY     BUBBLE STUDY  05/06/2021   Procedure: BUBBLE STUDY;  Surgeon: Thayer Headings, MD;  Location: [place];  Service: Cardiovascular;;   TEE  WITHOUT CARDIOVERSION N/A 05/06/2021   Procedure: TRANSESOPHAGEAL ECHOCARDIOGRAM (TEE);  Surgeon: Acie Fredrickson Wonda Cheng, MD;  Location: [facility];  Service: Cardiovascular;  Laterality: N/A;   Social History:  reports that she has never smoked. She has never used smokeless tobacco. She reports previous alcohol use. She reports that she does not use drugs. Patient lives at home  Allergies  Allergen Reactions   Amlodipine     Other reaction(s): headache   Doxycycline     Other reaction(s): rash   Motrin [Ibuprofen] Hives   Atorvastatin Hives and Rash    chills   Meloxicam Hives and Rash   Statins Hives and Rash    Family History  Problem Relation Age of Onset   Diabetes Maternal Grandfather       Prior to Admission medications   Medication Sig Start Date End Date Taking? Authorizing Provider  albuterol (VENTOLIN HFA) 108 (90 Base) MCG/ACT inhaler 2 puffs as needed 05/15/20   [provider]  allopurinol (ZYLOPRIM) 300 MG tablet Take 300 mg by mouth daily.    [provider]  amLODipine (NORVASC) 5 MG tablet 1 tablet    [provider]  aspirin EC 81 MG EC tablet Take 1 tablet (81 mg total) by mouth daily. Swallow whole. 10/31/20   [name], MD  blood glucose meter kit and supplies KIT Dispense based on patient and insurance preference. Use up to four times daily as directed. (FOR ICD-9 250.00, 250.01). 05/15/20   [name], MD  Blood Pressure  Monitoring (BLOOD PRESSURE KIT) KIT See admin instructions. 11/04/20   [provider]  carvedilol (COREG) 6.25 MG tablet Take 6.25 mg by mouth 2 (two) times daily with a meal.    [provider]  colchicine 0.6 MG tablet See admin instructions.    [provider]  Continuous Blood Gluc Sensor (FREESTYLE LIBRE 14 DAY SENSOR) MISC USE TO MONITOR BLOOD SUGAR DAILY AS DIRECTED    [provider]  Continuous Blood Gluc Sensor ([place]) MISC See admin  instructions. 02/21/18   [provider]  diphenhydrAMINE (BENADRYL ALLERGY) 25 MG tablet 1 tablet at bedtime as needed    [provider]  econazole nitrate 1 % cream Apply 1 application topically 2 (two) times daily. 05/02/20   [provider]  FLUoxetine (PROZAC) 10 MG tablet Take 10 mg by mouth daily.    [provider]  fluticasone (FLONASE ALLERGY RELIEF) 50 MCG/ACT nasal spray 2 spray in each nostril    [provider]  glipiZIDE (GLUCOTROL) 5 MG tablet 1 tablet    [provider]  hydrOXYzine (ATARAX/VISTARIL) 25 MG tablet 1 tablet as needed 03/13/19   [provider]  insulin glargine (LANTUS) 100 UNIT/ML Solostar Pen Inject into the skin daily.    [provider]  Insulin Pen Needle (NOVOFINE) 30G X 8 MM MISC Inject 10 each into the skin as directed. 10/30/20   [name], MD  Levothyroxine Sodium 25 MCG CAPS 1 capsule on an empty stomach in the morning    [provider]  linagliptin (TRADJENTA) 5 MG TABS tablet Take 1 tablet (5 mg total) by mouth daily. 05/15/20 06/14/20  [name], MD  lisinopril (ZESTRIL) 20 MG tablet Take 20 mg by mouth daily.    [provider]  losartan (COZAAR) 100 MG tablet 1 tablet    [provider]  metFORMIN (GLUCOPHAGE) 500 MG tablet 1 tablet with a meal    [provider]  PARoxetine (PAXIL) 40 MG tablet 1 tablet in the morning    [provider]  rosuvastatin (CRESTOR) 20 MG tablet Take 1 tablet (20 mg total) by mouth daily. 05/07/21   Pokhrel, Corrie Mckusick, MD  sitaGLIPtin (JANUVIA) 100 MG tablet Take 100 mg by mouth daily. 11/07/20   [provider]  terazosin (HYTRIN) 5 MG capsule 1 capsule    [provider]  vitamin B-12 (CYANOCOBALAMIN) 500 MCG tablet Take 1 tablet (500 mcg total) by mouth daily. 10/31/20   [name], MD  zolpidem Lorrin Mais) 5 MG tablet 1 tablet 10/24/15   [provider]    Physical  Exam: BP 121/67   Pulse 75   Temp 97.9 F (36.6 C) (Oral)   Resp 11   Ht 5' (1.524 m)   Wt 90.7 kg   SpO2 95%   BMI 39.06 kg/m   General: Elderly female. Awake and alert and oriented x2. No acute cardiopulmonary distress.  HEENT: Normocephalic atraumatic.  Right and left ears normal in appearance.  Pupils equal, round, reactive to light. Extraocular muscles are intact. Sclerae anicteric and noninjected.  Moist mucosal membranes. No mucosal lesions.  Neck: Neck supple without lymphadenopathy. No carotid bruits. No masses palpated.  Cardiovascular: Regular rate with normal S1-S2 sounds. No murmurs, rubs, gallops auscultated. No JVD.  Respiratory: Good respiratory effort with no wheezes, rales, rhonchi. Lungs clear to auscultation bilaterally.  No accessory muscle use. Abdomen: Soft, suprapubic tenderness, nondistended. Active bowel sounds. No masses or hepatosplenomegaly  Skin: No  rashes, lesions, or ulcerations.  Dry, warm to touch. 2+ dorsalis pedis and radial pulses. Musculoskeletal: No calf or leg pain. All major joints not erythematous nontender.  No upper or lower joint deformation.  Good ROM.  No contractures  Psychiatric: Intact judgment and insight. Pleasant and cooperative. Neurologic: No focal neurological deficits. Strength is 5/5 and symmetric in upper and lower extremities.  Cranial nerves II through XII are grossly intact.           Labs on Admission: I have personally reviewed following labs and imaging studies  CBC: Recent Labs  Lab 05/25/21 1604  WBC 8.8  NEUTROABS 5.7  HGB 12.8  HCT 38.3  MCV 87.2  PLT 940   Basic Metabolic Panel: Recent Labs  Lab 05/25/21 1604  NA 136  K 3.9  CL 102  CO2 25  GLUCOSE 73  BUN 36*  CREATININE 1.58*  CALCIUM 9.4   GFR: Estimated Creatinine Clearance: 32.3 mL/min (A) (by C-G formula based on SCr of 1.58 mg/dL (H)). Liver Function Tests: Recent Labs  Lab 05/25/21 1604  AST 25  ALT 18  ALKPHOS 79  BILITOT 0.4   PROT 6.9  ALBUMIN 3.4*   No results for input(s): LIPASE, AMYLASE in the last 168 hours. Recent Labs  Lab 05/25/21 1604  AMMONIA 34   Coagulation Profile: No results for input(s): INR, PROTIME in the last 168 hours. Cardiac Enzymes: No results for input(s): CKTOTAL, CKMB, CKMBINDEX, TROPONINI in the last 168 hours. BNP (last 3 results) No results for input(s): PROBNP in the last 8760 hours. HbA1C: No results for input(s): HGBA1C in the last 72 hours. CBG: Recent Labs  Lab 05/25/21 1518 05/25/21 1737  GLUCAP 73 106*   Lipid Profile: No results for input(s): CHOL, HDL, LDLCALC, TRIG, CHOLHDL, LDLDIRECT in the last 72 hours. Thyroid Function Tests: No results for input(s): TSH, T4TOTAL, FREET4, T3FREE, THYROIDAB in the last 72 hours. Anemia Panel: No results for input(s): VITAMINB12, FOLATE, FERRITIN, TIBC, IRON, RETICCTPCT in the last 72 hours. Urine analysis:    Component Value Date/Time   COLORURINE YELLOW 05/25/2021 1713   APPEARANCEUR CLOUDY (A) 05/25/2021 1713   LABSPEC 1.018 05/25/2021 1713   PHURINE 5.0 05/25/2021 1713   GLUCOSEU NEGATIVE 05/25/2021 1713   HGBUR SMALL (A) 05/25/2021 1713   BILIRUBINUR NEGATIVE 05/25/2021 1713   BILIRUBINUR neg 09/21/2013 1236   KETONESUR NEGATIVE 05/25/2021 1713   PROTEINUR NEGATIVE 05/25/2021 1713   UROBILINOGEN negative 09/21/2013 1236   NITRITE POSITIVE (A) 05/25/2021 1713   LEUKOCYTESUR LARGE (A) 05/25/2021 1713   Sepsis Labs: _0 (procalcitonin:4,lacticidven:4) ) Recent Results (from the past 240 hour(s))  Resp Panel by RT-PCR (Flu A&B, Covid) Nasopharyngeal Swab     Status: None   Collection Time: 05/25/21  3:58 PM   Specimen: Nasopharyngeal Swab; Nasopharyngeal(NP) swabs in vial transport medium  Result Value Ref Range Status   SARS Coronavirus 2 by RT PCR NEGATIVE NEGATIVE Final    Comment: (NOTE) SARS-CoV-2 target nucleic acids are NOT DETECTED.  The SARS-CoV-2 RNA is generally detectable in upper  respiratory specimens during the acute phase of infection. The lowest concentration of SARS-CoV-2 viral copies this assay can detect is 138 copies/mL. A negative result does not preclude SARS-Cov-2 infection and should not be used as the sole basis for treatment or other patient management decisions. A negative result may occur with  improper specimen collection/handling, submission of specimen other than nasopharyngeal swab, presence of viral mutation(s) within the areas targeted by this assay, and inadequate number  of viral copies(<138 copies/mL). A negative result must be combined with clinical observations, patient history, and epidemiological information. The expected result is Negative.  Fact Sheet for Patients:  EntrepreneurPulse.com.au  Fact Sheet for Healthcare Providers:  IncredibleEmployment.be  This test is no t yet approved or cleared by the [place] FDA and  has been authorized for detection and/or diagnosis of SARS-CoV-2 by FDA under an Emergency Use Authorization (EUA). This EUA will remain  in effect (meaning this test can be used) for the duration of the COVID-19 declaration under Section 564(b)(1) of the Act, 21 U.S.C.section 360bbb-3(b)(1), unless the authorization is terminated  or revoked sooner.       Influenza A by PCR NEGATIVE NEGATIVE Final   Influenza B by PCR NEGATIVE NEGATIVE Final    Comment: (NOTE) The Xpert Xpress SARS-CoV-2/FLU/RSV plus assay is intended as an aid in the diagnosis of influenza from Nasopharyngeal swab specimens and should not be used as a sole basis for treatment. Nasal washings and aspirates are unacceptable for Xpert Xpress SARS-CoV-2/FLU/RSV testing.  Fact Sheet for Patients: EntrepreneurPulse.com.au  Fact Sheet for Healthcare Providers: IncredibleEmployment.be  This test is not yet approved or cleared by the [place] FDA and has been  authorized for detection and/or diagnosis of SARS-CoV-2 by FDA under an Emergency Use Authorization (EUA). This EUA will remain in effect (meaning this test can be used) for the duration of the COVID-19 declaration under Section 564(b)(1) of the Act, 21 U.S.C. section 360bbb-3(b)(1), unless the authorization is terminated or revoked.  Performed at [facility], [address], [place], [place] [address]      Radiological Exams on Admission: CT Head Wo Contrast  Result Date: 05/25/2021 CLINICAL DATA:  Altered mental status.  Hypoglycemia. EXAM: CT HEAD WITHOUT CONTRAST TECHNIQUE: Contiguous axial images were obtained from the base of the skull through the vertex without intravenous contrast. COMPARISON:  Head CT and brain MRI 05/04/2021 FINDINGS: Brain: No hemorrhage. Expected evolution of right thalamic infarct. Additional small infarcts that were acute on prior MRI have no definite CT correlate. Background atrophy and chronic small vessel ischemia. Stable ventricular size and appearance with slight enlargement of right lateral ventricle compared to left. There are remote lacunar infarcts in the left basal ganglia. Minimal encephalomalacia in the left parietooccipital lobe is unchanged. No subdural or extra-axial collection. Vascular: Prominent skull base atherosclerosis. No hyperdense vessel or unexpected calcification. Skull: No fracture or focal lesion. Sinuses/Orbits: Paranasal sinuses and mastoid air cells are clear. The visualized orbits are unremarkable. Other: None. IMPRESSION: 1. No acute intracranial abnormality. 2. Expected evolution of right thalamic infarct from prior exam. Additional small infarcts that were acute on prior MRI have no definite CT correlate. 3. Background atrophy and chronic small vessel ischemia. Electronically Signed   By: Keith Rake M.D.   On: 05/25/2021 16:29   DG Chest Port 1 View  Result Date: 05/25/2021 CLINICAL DATA:  Weakness.  Lethargy. EXAM: PORTABLE  CHEST 1 VIEW COMPARISON:  10/26/2020 FINDINGS: The heart size and mediastinal contours are within normal limits. Tortuosity of thoracic aorta again noted. Both lungs are clear. The visualized skeletal structures are unremarkable. IMPRESSION: No active disease. Electronically Signed   By: Marlaine Hind M.D.   On: 05/25/2021 16:32    EKG: Independently reviewed.  Sinus rhythm.  Q waves suggestive of old inferior and anterior infarcts.  No acute ST changes.  Assessment/Plan: Active Problems:   Asthma, mild persistent   Diabetes mellitus type 2, controlled, without complications ([place])  Benign essential hypertension   Acute metabolic encephalopathy   Subclinical hypothyroidism   CVA (cerebral vascular accident) [facility])    This patient was discussed with the ED physician, including pertinent vitals, physical exam findings, labs, and imaging.  We also discussed care given by the ED provider.  Acute metabolic encephalopathy Secondary to UTI UTI Rocephin Repeat CBC tomorrow Urine culture pending History of CVA No focal deficits suggestive of another CVA Subclinical hypothyroidism Recent TSH 4 Continue levothyroxine  Hypertension Continue home regimen Diabetes Continue home regimen Sliding scale insulin    DVT prophylaxis: Lovenox Consultants: None Code Status: Full code Family Communication: Attempted to call patient's son, but no answer on phone Disposition Plan: Patient should be able to return home following admission   Truett Mainland, DO

## 2021-05-25 NOTE — ED Notes (Signed)
CBG - 106 ,hg

## 2021-05-25 NOTE — ED Provider Notes (Signed)
[facility] EMERGENCY DEPARTMENT Provider Note   CSN: [id number] Arrival date & time: 05/25/21  1514     History Chief Complaint  Patient presents with   Altered Mental Status    Caitlin Harrington is a 74 y.o. female.  She is brought in by EMS for altered mental status.  It sounds like this is been going on all day per EMS.  Family checked her sugar and found it to be low and gave her some Lantus and candy.  EMS gave her a pack of oral glucose.  Patient states she has been feeling weak and unsteady.  She thinks she might of had a stroke.  When asked her why she said she had 1 last month that she had the same kind of presentation.  She denies any headache chest pain abdominal pain.  No urinary symptoms.  No vomiting or diarrhea.  No fevers or chills.  The history is provided by the patient and the EMS personnel.  Altered Mental Status Presenting symptoms: lethargy   Severity:  Moderate Most recent episode:  Today Episode history:  Continuous Duration:  6 hours Timing:  Constant Progression:  Unchanged Chronicity:  Recurrent Associated symptoms: weakness (generalized)   Associated symptoms: no abdominal pain, no difficulty breathing, no fever, no headaches, no nausea, no rash, no visual change and no vomiting       Past Medical History:  Diagnosis Date   Asthma    COVID-19    Depression    Diabetes mellitus without complication (HCC)    Fatigue    Gout    Hypertension    Insomnia    Numbness    Obesity    Paresthesia    Vitamin D deficiency     Patient Active Problem List   Diagnosis Date Noted   CVA (cerebral vascular accident) (HCC) 05/04/2021   Acute CVA (cerebrovascular accident) (HCC) 05/04/2021   Pain in joint involving ankle and foot 11/06/2020   Acute renal insufficiency 11/06/2020   Allergic rhinitis 11/06/2020   Arthropathy 11/06/2020   Benign essential hypertension 11/06/2020   Bilateral carpal tunnel syndrome 11/06/2020   Chronic sinusitis 11/06/2020    Diverticulitis of colon 11/06/2020   Eczema 11/06/2020   Headache 11/06/2020   Long term (current) use of insulin (HCC) 11/06/2020   Acute metabolic encephalopathy 11/06/2020   Noncompliance with treatment 11/06/2020   Obstructive sleep apnea syndrome 11/06/2020   Other specified abnormal findings of blood chemistry 11/06/2020   Paresthesias 11/06/2020   Personal history of transient ischemic attack (TIA), and cerebral infarction without residual deficits 11/06/2020   Rash 11/06/2020   Depression 11/06/2020   Skin sensation disturbance 11/06/2020   Subclinical hypothyroidism 11/06/2020   Tick bite 11/06/2020   Tiredness 11/06/2020   Unspecified abnormal finding in specimens from other organs, systems and tissues 11/06/2020   Cerebral thrombosis with cerebral infarction 10/29/2020   Major depressive disorder, recurrent episode, moderate (HCC) 10/27/2020   Allergic reaction 10/26/2020   AKI (acute kidney injury) (HCC) 10/26/2020   Thrush 10/26/2020   Urinary tract infection without hematuria 05/12/2020   Encephalopathy 05/12/2020   Altered mental status    Hyperglycemia    Acute on chronic respiratory failure with hypoxia (HCC) 01/03/2020   COVID-19 12/30/2019   Hyperuricemia    Hypertension 02/18/2011   Prolonged depressive adjustment reaction 02/18/2011   Obesity 02/18/2011   Asthma, mild persistent 02/18/2011   Vitamin D deficiency 02/18/2011   Hyperlipidemia, unspecified 02/18/2011   Diabetes mellitus type 2, controlled, without   complications ([place]) 12/45/8099   Insomnia 02/18/2011   Stress at work 02/18/2011    Past Surgical History:  Procedure Laterality Date   ABDOMINAL HYSTERECTOMY     BUBBLE STUDY  05/06/2021   Procedure: BUBBLE STUDY;  Surgeon: Thayer Headings, MD;  Location: [facility];  Service: Cardiovascular;;   TEE WITHOUT CARDIOVERSION N/A 05/06/2021   Procedure: TRANSESOPHAGEAL ECHOCARDIOGRAM (TEE);  Surgeon: Acie Fredrickson Wonda Cheng, MD;  Location: [facility] ENDOSCOPY;   Service: Cardiovascular;  Laterality: N/A;     OB History   No obstetric history on file.     Family History  Problem Relation Age of Onset   Diabetes Maternal Grandfather     Social History   Tobacco Use   Smoking status: Never   Smokeless tobacco: Never  Vaping Use   Vaping Use: Never used  Substance Use Topics   Alcohol use: Not Currently   Drug use: No    Home Medications Prior to Admission medications   Medication Sig Start Date End Date Taking? Authorizing Provider  albuterol (VENTOLIN HFA) 108 (90 Base) MCG/ACT inhaler 2 puffs as needed 05/15/20   [provider]  allopurinol (ZYLOPRIM) 300 MG tablet Take 300 mg by mouth daily.    [provider]  amLODipine (NORVASC) 5 MG tablet 1 tablet    [provider]  aspirin EC 81 MG EC tablet Take 1 tablet (81 mg total) by mouth daily. Swallow whole. 10/31/20   [name], MD  blood glucose meter kit and supplies KIT Dispense based on patient and insurance preference. Use up to four times daily as directed. (FOR ICD-9 250.00, 250.01). 05/15/20   [name], MD  Blood Pressure Monitoring (BLOOD PRESSURE KIT) KIT See admin instructions. 11/04/20   [provider]  carvedilol (COREG) 6.25 MG tablet Take 6.25 mg by mouth 2 (two) times daily with a meal.    [provider]  colchicine 0.6 MG tablet See admin instructions.    [provider]  Continuous Blood Gluc Sensor (FREESTYLE LIBRE 14 DAY SENSOR) MISC USE TO MONITOR BLOOD SUGAR DAILY AS DIRECTED    [provider]  Continuous Blood Gluc Sensor ([place]) MISC See admin instructions. 02/21/18   [provider]  diphenhydrAMINE (BENADRYL ALLERGY) 25 MG tablet 1 tablet at bedtime as needed    [provider]  econazole nitrate 1 % cream Apply 1 application topically 2 (two) times daily. 05/02/20   [provider]  FLUoxetine (PROZAC) 10 MG tablet Take 10 mg by mouth  daily.    [provider]  fluticasone (FLONASE ALLERGY RELIEF) 50 MCG/ACT nasal spray 2 spray in each nostril    [provider]  glipiZIDE (GLUCOTROL) 5 MG tablet 1 tablet    [provider]  hydrOXYzine (ATARAX/VISTARIL) 25 MG tablet 1 tablet as needed 03/13/19   [provider]  insulin glargine (LANTUS) 100 UNIT/ML Solostar Pen Inject into the skin daily.    [provider]  Insulin Pen Needle (NOVOFINE) 30G X 8 MM MISC Inject 10 each into the skin as directed. 10/30/20   [name], MD  Levothyroxine Sodium 25 MCG CAPS 1 capsule on an empty stomach in the morning    [provider]  linagliptin (TRADJENTA) 5 MG TABS tablet Take 1 tablet (5 mg total) by mouth daily. 05/15/20 06/14/20  [name], MD  lisinopril (ZESTRIL) 20 MG tablet Take 20 mg by mouth daily.    [provider]  losartan (COZAAR) 100  MG tablet 1 tablet    [provider]  metFORMIN (GLUCOPHAGE) 500 MG tablet 1 tablet with a meal    [provider]  PARoxetine (PAXIL) 40 MG tablet 1 tablet in the morning    [provider]  rosuvastatin (CRESTOR) 20 MG tablet Take 1 tablet (20 mg total) by mouth daily. 05/07/21   Pokhrel, Corrie Mckusick, MD  sitaGLIPtin (JANUVIA) 100 MG tablet Take 100 mg by mouth daily. 11/07/20   [provider]  terazosin (HYTRIN) 5 MG capsule 1 capsule    [provider]  vitamin B-12 (CYANOCOBALAMIN) 500 MCG tablet Take 1 tablet (500 mcg total) by mouth daily. 10/31/20   [name], MD  zolpidem Lorrin Mais) 5 MG tablet 1 tablet 10/24/15   [provider]    Allergies    Amlodipine, Doxycycline, Motrin [ibuprofen], Atorvastatin, Meloxicam, and Statins  Review of Systems   Review of Systems  Constitutional:  Negative for fever.  HENT:  Negative for sore throat.   Eyes:  Negative for visual disturbance.  Respiratory:  Negative for shortness of breath.   Cardiovascular:  Negative for chest  pain.  Gastrointestinal:  Negative for abdominal pain, nausea and vomiting.  Genitourinary:  Negative for dysuria.  Musculoskeletal:  Positive for gait problem. Negative for neck pain.  Skin:  Negative for rash.  Neurological:  Positive for weakness (generalized). Negative for headaches.   Physical Exam Updated Vital Signs BP 126/67 (BP Location: Right Arm)   Pulse 87   Temp 97.9 F (36.6 C) (Oral)   Resp 14   Ht 5' (1.524 m)   Wt 90.7 kg   SpO2 93%   BMI 39.06 kg/m   Physical Exam Vitals and nursing note reviewed.  Constitutional:      General: She is not in acute distress.    Appearance: Normal appearance. She is well-developed.  HENT:     Head: Normocephalic and atraumatic.  Eyes:     Conjunctiva/sclera: Conjunctivae normal.  Cardiovascular:     Rate and Rhythm: Normal rate and regular rhythm.     Heart sounds: No murmur heard. Pulmonary:     Effort: Pulmonary effort is normal. No respiratory distress.     Breath sounds: Normal breath sounds.  Abdominal:     Palpations: Abdomen is soft.     Tenderness: There is no abdominal tenderness.  Musculoskeletal:        General: No deformity or signs of injury. Normal range of motion.     Cervical back: Neck supple.  Skin:    General: Skin is warm and dry.  Neurological:     General: No focal deficit present.     Mental Status: She is alert and oriented to person, place, and time.     Cranial Nerves: No cranial nerve deficit.     Sensory: No sensory deficit.     Motor: No weakness.    ED Results / Procedures / Treatments   Labs (all labs ordered are listed, but only abnormal results are displayed) Labs Reviewed  CBC WITH DIFFERENTIAL/PLATELET - Abnormal; Notable for the following components:      Result Value   Eosinophils Absolute 0.6 (*)    All other components within normal limits  COMPREHENSIVE METABOLIC PANEL - Abnormal; Notable for the following components:   BUN 36 (*)    Creatinine, Ser 1.58 (*)     Albumin 3.4 (*)    GFR, Estimated 35 (*)    All other components within normal limits  URINALYSIS,  ROUTINE W REFLEX MICROSCOPIC - Abnormal; Notable for the following components:   APPearance CLOUDY (*)    Hgb urine dipstick SMALL (*)    Nitrite POSITIVE (*)    Leukocytes,Ua LARGE (*)    WBC, UA >50 (*)    Bacteria, UA MANY (*)    All other components within normal limits  BLOOD GAS, VENOUS - Abnormal; Notable for the following components:   pCO2, Ven 38.8 (*)    pO2, Ven 76.8 (*)    All other components within normal limits  BASIC METABOLIC PANEL - Abnormal; Notable for the following components:   Potassium 3.1 (*)    Glucose, Bld 101 (*)    BUN 30 (*)    Creatinine, Ser 1.32 (*)    GFR, Estimated 43 (*)    All other components within normal limits  GLUCOSE, CAPILLARY - Abnormal; Notable for the following components:   Glucose-Capillary 130 (*)    All other components within normal limits  CBG MONITORING, ED - Abnormal; Notable for the following components:   Glucose-Capillary 106 (*)    All other components within normal limits  RESP PANEL BY RT-PCR (FLU A&B, COVID) [id number]  URINE CULTURE  RAPID URINE DRUG SCREEN, HOSP PERFORMED  AMMONIA  ETHANOL  CBC  GLUCOSE, CAPILLARY  GLUCOSE, CAPILLARY  VITAMIN B12  FOLATE  CBG MONITORING, ED  CBG MONITORING, ED  TROPONIN I (HIGH SENSITIVITY)  TROPONIN I (HIGH SENSITIVITY)    EKG EKG Interpretation  Date/Time:  Sunday May 25 2021 16:59:39 EDT Ventricular Rate:  79 PR Interval:  145 QRS Duration: 93 QT Interval:  410 QTC Calculation: 470 R Axis:   -17 Text Interpretation: Sinus rhythm Inferior infarct, age indeterminate Anterolateral infarct, age indeterminate No significant change since prior 6/22 Confirmed by Butler, Michael ([id number]) on 05/25/2021 5:02:23 PM  Radiology CT Head Wo Contrast  Result Date: 05/25/2021 CLINICAL DATA:  Altered mental status.  Hypoglycemia. EXAM: CT HEAD WITHOUT CONTRAST TECHNIQUE: Contiguous  axial images were obtained from the base of the skull through the vertex without intravenous contrast. COMPARISON:  Head CT and brain MRI 05/04/2021 FINDINGS: Brain: No hemorrhage. Expected evolution of right thalamic infarct. Additional small infarcts that were acute on prior MRI have no definite CT correlate. Background atrophy and chronic small vessel ischemia. Stable ventricular size and appearance with slight enlargement of right lateral ventricle compared to left. There are remote lacunar infarcts in the left basal ganglia. Minimal encephalomalacia in the left parietooccipital lobe is unchanged. No subdural or extra-axial collection. Vascular: Prominent skull base atherosclerosis. No hyperdense vessel or unexpected calcification. Skull: No fracture or focal lesion. Sinuses/Orbits: Paranasal sinuses and mastoid air cells are clear. The visualized orbits are unremarkable. Other: None. IMPRESSION: 1. No acute intracranial abnormality. 2. Expected evolution of right thalamic infarct from prior exam. Additional small infarcts that were acute on prior MRI have no definite CT correlate. 3. Background atrophy and chronic small vessel ischemia. Electronically Signed   By: Melanie  Sanford M.D.   On: 05/25/2021 16:29   DG Chest Port 1 View  Result Date: 05/25/2021 CLINICAL DATA:  Weakness.  Lethargy. EXAM: PORTABLE CHEST 1 VIEW COMPARISON:  10/26/2020 FINDINGS: The heart size and mediastinal contours are within normal limits. Tortuosity of thoracic aorta again noted. Both lungs are clear. The visualized skeletal structures are unremarkable. IMPRESSION: No active disease. Electronically Signed   By: John A Stahl M.D.   On: 05/25/2021 16:32    Procedures Procedures   Medications Ordered in ED Medications    allopurinol (ZYLOPRIM) tablet 300 mg (300 mg Oral Given 05/26/21 1001)  aspirin EC tablet 81 mg (81 mg Oral Given 05/26/21 1001)  amLODipine (NORVASC) tablet 5 mg (5 mg Oral Given 05/26/21 1001)  carvedilol  (COREG) tablet 6.25 mg (6.25 mg Oral Given 05/26/21 1001)  rosuvastatin (CRESTOR) tablet 20 mg (20 mg Oral Given 05/25/21 2216)  terazosin (HYTRIN) capsule 5 mg (has no administration in time range)  levothyroxine (SYNTHROID) tablet 25 mcg (25 mcg Oral Given 05/26/21 0556)  linagliptin (TRADJENTA) tablet 5 mg (5 mg Oral Given 05/26/21 1001)  albuterol (PROVENTIL) (2.5 MG/3ML) 0.083% nebulizer solution 3 mL (has no administration in time range)  insulin aspart (novoLOG) injection 0-15 Units (0 Units Subcutaneous Not Given 05/26/21 0758)  insulin aspart (novoLOG) injection 0-5 Units (0 Units Subcutaneous Not Given 05/25/21 2147)  enoxaparin (LOVENOX) injection 40 mg (40 mg Subcutaneous Given 05/25/21 2216)  acetaminophen (TYLENOL) tablet 650 mg (has no administration in time range)    Or  acetaminophen (TYLENOL) suppository 650 mg (has no administration in time range)  ondansetron (ZOFRAN) tablet 4 mg (has no administration in time range)    Or  ondansetron (ZOFRAN) injection 4 mg (has no administration in time range)  lactated ringers infusion ( Intravenous New Bag/Given 05/25/21 2215)  cefTRIAXone (ROCEPHIN) 1 g in sodium chloride 0.9 % 100 mL IVPB (has no administration in time range)  clopidogrel (PLAVIX) tablet 75 mg (75 mg Oral Given 05/26/21 1000)  insulin glargine (LANTUS) injection 5 Units (5 Units Subcutaneous Given 05/26/21 1001)  cefTRIAXone (ROCEPHIN) 1 g in sodium chloride 0.9 % 100 mL IVPB (0 g Intravenous Stopped 05/25/21 2034)    ED Course  I have reviewed the triage vital signs and the nursing notes.  Pertinent labs & imaging results that were available during my care of the patient were reviewed by me and considered in my medical decision making (see chart for details).  Clinical Course as of 05/26/21 1026  Sun May 25, 2021  1717 Discussed with [facility] hospitalist Dr. Sheran Lawless who will evaluate the patient for possible admission [MB]    Clinical Course User Index [MB] Hayden Rasmussen, MD    MDM Rules/Calculators/A&P                         This patient complains of unsteady gait and confusion; this involves an extensive number of treatment Options and is a complaint that carries with it a high risk of complications and Morbidity. The differential includes stroke, infection, metabolic derangement, COVID, intoxication, hypoglycemia  I ordered, reviewed and interpreted labs, which included CBC with normal white count normal hemoglobin, chemistries fairly normal other than elevated creatinine similar to priors, VBG without evidence of CO2 retention, ammonia normal, alcohol normal, urinalysis consistent with infection, U tox negative, COVID swab negative I ordered medication IV antibiotics I ordered imaging studies which included chest x-ray and head CT and I independently    visualized and interpreted imaging which showed no acute findings Additional history obtained from EMS and patient family member Previous records obtained and reviewed in epic including recent admission for acute stroke I consulted [facility] hospitalist Dr. Nehemiah Settle and discussed lab and imaging findings  Critical Interventions: None  After the interventions stated above, I reevaluated the patient and found patient to be awake and alert.  Unfortunately not appropriate for discharge at this time.  She will be admitted to the hospital for further work-up.   Final Clinical Impression(s) / ED Diagnoses  Final diagnoses:  Altered mental status, unspecified altered mental status type  Unsteady gait  Lower urinary tract infectious disease    Rx / DC Orders ED Discharge Orders     None        Butler, Michael C, MD 05/26/21 1031  

## 2021-05-25 NOTE — ED Notes (Addendum)
Spoke with pts. Son on the phone. Son was very upset and cussing on the phone. This nurse explained pts. Status. Son became very apologetic and this nurse told son that they would let the doctor know that they wish to speak with the doctor. Son stated that their is some confusion with what meds to give for insulin.

## 2021-05-25 NOTE — ED Triage Notes (Signed)
Pt brought in by [facility] from home with c/o AMS and hypoglycemia. Family felt pt was acting different all day so they checked her sugar and it was 60. Family then gave her 15 units of Lantus and then started feeding her candy. EMS arrived and CBG was 60. EMS gave 1 pack of oral glucose. When EMS left her house CBG was 165. Upon arrival to ED, EMS checked her sugar again and it was 106. EMS reports constricted, sluggish pupils.

## 2021-05-26 DIAGNOSIS — Z794 Long term (current) use of insulin: Secondary | ICD-10-CM

## 2021-05-26 DIAGNOSIS — I10 Essential (primary) hypertension: Secondary | ICD-10-CM

## 2021-05-26 DIAGNOSIS — N1832 Chronic kidney disease, stage 3b: Secondary | ICD-10-CM

## 2021-05-26 DIAGNOSIS — R2681 Unsteadiness on feet: Secondary | ICD-10-CM

## 2021-05-26 DIAGNOSIS — E1165 Type 2 diabetes mellitus with hyperglycemia: Secondary | ICD-10-CM

## 2021-05-26 DIAGNOSIS — E1169 Type 2 diabetes mellitus with other specified complication: Secondary | ICD-10-CM

## 2021-05-26 DIAGNOSIS — N183 Chronic kidney disease, stage 3 unspecified: Secondary | ICD-10-CM

## 2021-05-26 DIAGNOSIS — N1831 Chronic kidney disease, stage 3a: Secondary | ICD-10-CM

## 2021-05-26 LAB — CBC
HCT: 39.1 % (ref 36.0–46.0)
Hemoglobin: 12.6 g/dL (ref 12.0–15.0)
MCH: 28.4 pg (ref 26.0–34.0)
MCHC: 32.2 g/dL (ref 30.0–36.0)
MCV: 88.3 fL (ref 80.0–100.0)
Platelets: 253 10*3/uL (ref 150–400)
RBC: 4.43 MIL/uL (ref 3.87–5.11)
RDW: 13.1 % (ref 11.5–15.5)
WBC: 8.8 10*3/uL (ref 4.0–10.5)
nRBC: 0 % (ref 0.0–0.2)

## 2021-05-26 LAB — BASIC METABOLIC PANEL
Anion gap: 8 (ref 5–15)
BUN: 30 mg/dL — ABNORMAL HIGH (ref 8–23)
CO2: 25 mmol/L (ref 22–32)
Calcium: 8.9 mg/dL (ref 8.9–10.3)
Chloride: 105 mmol/L (ref 98–111)
Creatinine, Ser: 1.32 mg/dL — ABNORMAL HIGH (ref 0.44–1.00)
GFR, Estimated: 43 mL/min — ABNORMAL LOW (ref >60)
Glucose, Bld: 101 mg/dL — ABNORMAL HIGH (ref 70–99)
Potassium: 3.1 mmol/L — ABNORMAL LOW (ref 3.5–5.1)
Sodium: 138 mmol/L (ref 135–145)

## 2021-05-26 LAB — FOLATE: Folate: 7.9 ng/mL (ref >5.9)

## 2021-05-26 LAB — GLUCOSE, CAPILLARY
Glucose-Capillary: 98 mg/dL (ref 70–99)
Glucose-Capillary: 169 mg/dL — ABNORMAL HIGH (ref 70–99)
Glucose-Capillary: 122 mg/dL — ABNORMAL HIGH (ref 70–99)
Glucose-Capillary: 121 mg/dL — ABNORMAL HIGH (ref 70–99)

## 2021-05-26 LAB — VITAMIN B12: Vitamin B-12: 642 pg/mL (ref 180–914)

## 2021-05-26 MED ORDER — CLOPIDOGREL BISULFATE 75 MG PO TABS
75.0000 mg | ORAL_TABLET | Freq: Every day | ORAL | Status: DC
  Administered 2021-05-26 – 2021-05-27 (×2): 75 mg via ORAL
  Filled 2021-05-26 (×2): qty 1

## 2021-05-26 MED ORDER — INSULIN GLARGINE 100 UNIT/ML ~~LOC~~ SOLN
5.0000 [IU] | Freq: Every day | SUBCUTANEOUS | Status: DC
  Administered 2021-05-26 – 2021-05-27 (×2): 5 [IU] via SUBCUTANEOUS
  Filled 2021-05-26 (×3): qty 0.05

## 2021-05-26 NOTE — Plan of Care (Signed)

## 2021-05-26 NOTE — Progress Notes (Signed)
PROGRESS NOTE  TRINITEE HORGAN UJW:[id number] DOB: [birth year] DOA: 05/25/2021 PCP: Ileana Ladd, MD  Brief History:  75 year old female with history of stroke, hypertension, diabetes mellitus, OSA, hyperlipidemia, asthma presenting with 1 day history of altered mental status.  The patient was recently admitted to the hospital from 05/03/2021 to 05/07/2021 where she was treated for multifocal ischemic stroke.  The patient was sent home with aspirin 81 mg daily.  Patient endorses compliance with her medications.  However in speaking with the patient's family, it appears that the patient has poor insight regarding her medications and there are some questions about her compliance.  Nevertheless, there is been no reported fevers, chills, chest pain, shortness of breath, nausea, vomiting, direct abdominal pain.  Apparently, the patient was with her son yesterday and while taking a walk, the patient began feeling dizzy and unsteady.  Family noted her to be confused.  As result, EMS was activated and the patient was brought to emergency department for further evaluation.  The family checked her CBG and it was noted to be 60.  Upon EMS arrival, it was noted to be 165. In the emergency department, the patient was afebrile hemodynamically stable with oxygen saturation 98% on room air.  BMP showed a sodium 136, potassium 3.9, serum creatinine 1.58.  Chest x-ray was negative.  CT of the brain was negative for any acute findings.  VBG showed pH 7.410/38/76/24.  UA showed >50 WBC.  The patient was started on IV fluids and ceftriaxone.  Assessment/Plan: Acute metabolic encephalopathy -Multifactorial including hypoglycemia, dehydration and UTI -Continue IV ceftriaxone -Continue IV fluids -hold atarax  Pyuria -Concern for UTI -Continue ceftriaxone pending culture data  Hypoglycemia -Monitor CBGs -decrease lantus  Uncontrolled diabetes mellitus type 2 with hyperglycemia -05/03/2021 hemoglobin A1c  10.7 -holding glipizide and metformin -decrease lantus -novolog ISS  CKD 3b -baseline creatinine 1.3-1.5 -am BMP  Recent multifocal ischemic stroke -Neurology recommendation was for aspirin and Plavix -Reviewed the medical record shows the patient was discharged home on aspirin -Restart Plavix -PT evaluation  Essential hypertension -Continue amlodipine and carvedilol -Continue terazosin  Hypothyroidism -Continue Synthroid  Hyperlipidemia -Continue statin  Morbid Obesity -BMI 39.06 -lifestyle modification     Status is: Inpatient  Remains inpatient appropriate because:IV treatments appropriate due to intensity of illness or inability to take PO  Dispo: The patient is from: Home              Anticipated d/c is to: Home              Patient currently is not medically stable to d/c.   Difficult to place patient No        Family Communication:   son updated 7/4  Consultants:  none  Code Status:  FULL   DVT Prophylaxis:  Stanley Lovenox   Procedures: As Listed in Progress Note Above  Antibiotics: Ceftriaxone 7/3>>       Subjective: Patient denies fevers, chills, headache, chest pain, dyspnea, nausea, vomiting, diarrhea, abdominal pain, dysuria, hematuria, hematochezia, and melena.   Objective: Vitals:   05/25/21 1800 05/25/21 2127 05/26/21 0206 05/26/21 0601  BP: 121/67 (!) 155/79 (!) 152/74 (!) 157/79  Pulse: 75 68 66 70  Resp: Temp:  97.7 F (36.5 C) 98.2 F (36.8 C) 98.4 F (36.9 C)  TempSrc:  Oral Oral   SpO2: 95% 97% 98% 98%  Weight:      Height:  Intake/Output Summary (Last 24 hours) at 05/26/2021 0839 Last data filed at 05/26/2021 0500 Gross per 24 hour  Intake 754.93 ml  Output 500 ml  Net 254.93 ml   Weight change:  Exam:  General:  Pt is alert, follows commands appropriately, not in acute distress HEENT: No icterus, No thrush, No neck mass, [place]/AT Cardiovascular: RRR, S1/S2, no rubs, no  gallops Respiratory: bibasilar crackles. No wheeze Abdomen: Soft/+BS, non tender, non distended, no guarding Extremities: No edema, No lymphangitis, No petechiae, No rashes, no synovitis   Data Reviewed: I have personally reviewed following labs and imaging studies Basic Metabolic Panel: Recent Labs  Lab 05/25/21 1604 05/26/21 0635  NA 136 138  K 3.9 3.1*  CL 102 105  CO2 25 25  GLUCOSE 73 101*  BUN 36* 30*  CREATININE 1.58* 1.32*  CALCIUM 9.4 8.9   Liver Function Tests: Recent Labs  Lab 05/25/21 1604  AST 25  ALT 18  ALKPHOS 79  BILITOT 0.4  PROT 6.9  ALBUMIN 3.4*   No results for input(s): LIPASE, AMYLASE in the last 168 hours. Recent Labs  Lab 05/25/21 1604  AMMONIA 34   Coagulation Profile: No results for input(s): INR, PROTIME in the last 168 hours. CBC: Recent Labs  Lab 05/25/21 1604 05/26/21 0635  WBC 8.8 8.8  NEUTROABS 5.7  --   HGB 12.8 12.6  HCT 38.3 39.1  MCV 87.2 88.3  PLT 237 253   Cardiac Enzymes: No results for input(s): CKTOTAL, CKMB, CKMBINDEX, TROPONINI in the last 168 hours. BNP: Invalid input(s): POCBNP CBG: Recent Labs  Lab 05/25/21 1737 05/25/21 1955 05/25/21 2124 05/25/21 2329 05/26/21 0716  GLUCAP 106* 89 80 130* 98   HbA1C: No results for input(s): HGBA1C in the last 72 hours. Urine analysis:    Component Value Date/Time   COLORURINE YELLOW 05/25/2021 1713   APPEARANCEUR CLOUDY (A) 05/25/2021 1713   LABSPEC 1.018 05/25/2021 1713   PHURINE 5.0 05/25/2021 1713   GLUCOSEU NEGATIVE 05/25/2021 1713   HGBUR SMALL (A) 05/25/2021 1713   BILIRUBINUR NEGATIVE 05/25/2021 1713   BILIRUBINUR neg 09/21/2013 1236   KETONESUR NEGATIVE 05/25/2021 1713   PROTEINUR NEGATIVE 05/25/2021 1713   UROBILINOGEN negative 09/21/2013 1236   NITRITE POSITIVE (A) 05/25/2021 1713   LEUKOCYTESUR LARGE (A) 05/25/2021 1713   Sepsis Labs: @LABRCNTIP (procalcitonin:4,lacticidven:4) ) Recent Results (from the past 240 hour(s))  Resp Panel by  RT-PCR (Flu A&B, Covid) Nasopharyngeal Swab     Status: None   Collection Time: 05/25/21  3:58 PM   Specimen: Nasopharyngeal Swab; Nasopharyngeal(NP) swabs in vial transport medium  Result Value Ref Range Status   SARS Coronavirus 2 by RT PCR NEGATIVE NEGATIVE Final    Comment: (NOTE) SARS-CoV-2 target nucleic acids are NOT DETECTED.  The SARS-CoV-2 RNA is generally detectable in upper respiratory specimens during the acute phase of infection. The lowest concentration of SARS-CoV-2 viral copies this assay can detect is 138 copies/mL. A negative result does not preclude SARS-Cov-2 infection and should not be used as the sole basis for treatment or other patient management decisions. A negative result may occur with  improper specimen collection/handling, submission of specimen other than nasopharyngeal swab, presence of viral mutation(s) within the areas targeted by this assay, and inadequate number of viral copies(<138 copies/mL). A negative result must be combined with clinical observations, patient history, and epidemiological information. The expected result is Negative.  Fact Sheet for Patients:  07/26/21  Fact Sheet for Healthcare Providers:  BloggerCourse.com  This test is no  t yet approved or cleared by the [place] and  has been authorized for detection and/or diagnosis of SARS-CoV-2 by FDA under an Emergency Use Authorization (EUA). This EUA will remain  in effect (meaning this test can be used) for the duration of the COVID-19 declaration under Section 564(b)(1) of the Act, 21 U.S.C.section 360bbb-3(b)(1), unless the authorization is terminated  or revoked sooner.       Influenza A by PCR NEGATIVE NEGATIVE Final   Influenza B by PCR NEGATIVE NEGATIVE Final    Comment: (NOTE) The Xpert Xpress SARS-CoV-2/FLU/RSV plus assay is intended as an aid in the diagnosis of influenza from Nasopharyngeal swab  specimens and should not be used as a sole basis for treatment. Nasal washings and aspirates are unacceptable for Xpert Xpress SARS-CoV-2/FLU/RSV testing.  Fact Sheet for Patients: BloggerCourse.com  Fact Sheet for Healthcare Providers: SeriousBroker.it  This test is not yet approved or cleared by the [place] FDA and has been authorized for detection and/or diagnosis of SARS-CoV-2 by FDA under an Emergency Use Authorization (EUA). This EUA will remain in effect (meaning this test can be used) for the duration of the COVID-19 declaration under Section 564(b)(1) of the Act, 21 U.S.C. section 360bbb-3(b)(1), unless the authorization is terminated or revoked.  Performed at [facility], [address] [place], [place] [address]      Scheduled Meds:  allopurinol  300 mg Oral Daily   amLODipine  5 mg Oral Daily   aspirin EC  81 mg Oral Daily   carvedilol  6.25 mg Oral BID WC   enoxaparin (LOVENOX) injection  40 mg Subcutaneous Q24H   insulin aspart  0-15 Units Subcutaneous TID WC   insulin aspart  0-5 Units Subcutaneous QHS   insulin glargine  15 Units Subcutaneous Daily   levothyroxine  25 mcg Oral q AM   linagliptin  5 mg Oral Daily   rosuvastatin  20 mg Oral QPM   terazosin  5 mg Oral QHS   Continuous Infusions:  cefTRIAXone (ROCEPHIN)  IV     lactated ringers 75 mL/hr at 05/25/21 2215    Procedures/Studies: CT Head Wo Contrast  Result Date: 05/25/2021 CLINICAL DATA:  Altered mental status.  Hypoglycemia. EXAM: CT HEAD WITHOUT CONTRAST TECHNIQUE: Contiguous axial images were obtained from the base of the skull through the vertex without intravenous contrast. COMPARISON:  Head CT and brain MRI 05/04/2021 FINDINGS: Brain: No hemorrhage. Expected evolution of right thalamic infarct. Additional small infarcts that were acute on prior MRI have no definite CT correlate. Background atrophy and chronic small vessel ischemia.  Stable ventricular size and appearance with slight enlargement of right lateral ventricle compared to left. There are remote lacunar infarcts in the left basal ganglia. Minimal encephalomalacia in the left parietooccipital lobe is unchanged. No subdural or extra-axial collection. Vascular: Prominent skull base atherosclerosis. No hyperdense vessel or unexpected calcification. Skull: No fracture or focal lesion. Sinuses/Orbits: Paranasal sinuses and mastoid air cells are clear. The visualized orbits are unremarkable. Other: None. IMPRESSION: 1. No acute intracranial abnormality. 2. Expected evolution of right thalamic infarct from prior exam. Additional small infarcts that were acute on prior MRI have no definite CT correlate. 3. Background atrophy and chronic small vessel ischemia. Electronically Signed   By: Narda Rutherford M.D.   On: 05/25/2021 16:29   CT HEAD WO CONTRAST  Result Date: 05/04/2021 CLINICAL DATA:  Mental status change.  Possible stroke. EXAM: CT HEAD WITHOUT CONTRAST TECHNIQUE: Contiguous axial images were obtained from the  base of the skull through the vertex without intravenous contrast. COMPARISON:  05/04/2021 at 3:23 a.m., 05/03/2021 as well as MRI 05/03/2021 FINDINGS: Brain: Ventricles, cisterns and other CSF spaces are unchanged. Evidence of patient's known acute right lamina infarct unchanged. Other too small bilateral acute infarcts seen on recent MRI not visualized by CT. Moderate chronic ischemic microvascular disease. Old lacunar infarct over the right lentiform nucleus. No mass, mass effect or shift of midline structures. No acute hemorrhage. Vascular: No hyperdense vessel or unexpected calcification. Skull: Normal. Negative for fracture or focal lesion. Sinuses/Orbits: No acute finding. Other: None. IMPRESSION: 1. Evidence of patient's known acute right thalamus infarct unchanged. Other two small bilateral acute infarcts seen on recent MRI are not visualized by CT. No acute  hemorrhage. 2. Moderate chronic ischemic microvascular disease. Old right basal ganglia lacunar infarct. Electronically Signed   By: Elberta Fortis M.D.   On: 05/04/2021 11:46   CT HEAD WO CONTRAST  Result Date: 05/04/2021 CLINICAL DATA:  Altered mental status EXAM: CT HEAD WITHOUT CONTRAST TECHNIQUE: Contiguous axial images were obtained from the base of the skull through the vertex without intravenous contrast. COMPARISON:  05/03/2021 FINDINGS: Brain: The previously seen acute acute right thalamic infarct on MRI is again noted. The previously seen smaller bifrontal infarcts not definitively seen by CT. No hemorrhage. No new areas of infarction. Old right basal ganglia lacunar infarct. Chronic small vessel disease throughout the deep white matter. Vascular: No hyperdense vessel or unexpected calcification. Skull: No acute calvarial abnormality. Sinuses/Orbits: No acute findings Other: None IMPRESSION: Acute right thalamic infarct again seen as noted on prior MRI. Other smaller frontal infarcts not visualized by CT. No hemorrhage. Electronically Signed   By: Charlett Nose M.D.   On: 05/04/2021 03:39   CT HEAD WO CONTRAST  Result Date: 05/03/2021 CLINICAL DATA:  Headache and generalized weakness since last evening. EXAM: CT HEAD WITHOUT CONTRAST TECHNIQUE: Contiguous axial images were obtained from the base of the skull through the vertex without intravenous contrast. COMPARISON:  Head CT 10/26/2020 FINDINGS: Brain: Stable age advanced cerebral atrophy, ventriculomegaly and periventricular white matter disease. Stable remote right basal ganglia infarct with encephalomalacia and ex vacuo dilatation of the frontal horn of the right lateral ventricle. No extra-axial fluid collections are identified. No CT findings for acute hemispheric infarction or intracranial hemorrhage. No mass lesions. The brainstem and cerebellum are normal. Vascular: Vascular calcifications but no aneurysm or hyperdense vessels. Skull: No  skull fracture or bone lesions. Sinuses/Orbits: The paranasal sinuses and mastoid air cells are clear. The globes are intact. Other: No scalp lesions or scalp hematoma. IMPRESSION: 1. Stable age advanced cerebral atrophy, ventriculomegaly and periventricular white matter disease. 2. Stable remote right basal ganglia infarct. 3. No acute intracranial findings or mass lesions. Electronically Signed   By: Rudie Meyer M.D.   On: 05/03/2021 18:20   MR ANGIO HEAD WO CONTRAST  Result Date: 05/04/2021 CLINICAL DATA:  Acute strokes on MRI brain EXAM: MRA HEAD WITHOUT CONTRAST MRA NECK WITHOUT CONTRAST TECHNIQUE: Angiographic images of the Circle of Willis were obtained using MRA technique without intravenous contrast. Angiographic images of the neck were obtained using MRA technique without intravenous contrast. Carotid stenosis measurements (when applicable) are obtained utilizing NASCET criteria, using the distal internal carotid diameter as the denominator. COMPARISON:  Correlation made with prior MRI brain 05/03/2021, CTA neck 10/29/2020 FINDINGS: MRA HEAD Motion artifact is present. Intracranial internal carotid arteries are patent with atherosclerotic irregularity. Anterior cerebral arteries are patent. Right A1 ACA  is no longer identified launch could be related artifact in the area or new stenosis. Anterior communicating artery is present. Middle cerebral arteries are patent with multifocal atherosclerotic irregularity. Intracranial vertebral arteries, basilar artery, posterior cerebral arteries are patent. Atherosclerotic irregularity of the posterior cerebral arteries. No aneurysm identified. MRA NECK Common, internal, and external carotid arteries are patent. There is no hemodynamically significant stenosis at the ICA origins. Retropharyngeal course. No evidence of dissection. Extracranial vertebral arteries are patent. Origins are not well evaluated due to artifact. No evidence of dissection. IMPRESSION:  Suboptimal evaluation due to motion artifact. No large vessel occlusion. No hemodynamically significant stenosis in the neck. Multifocal intracranial atherosclerosis also seen on 2021 CTA. There is no flow seen within the right A1 ACA, which could be related to artifact in the area or new stenosis. Electronically Signed   By: Guadlupe Spanish M.D.   On: 05/04/2021 14:44   MR ANGIO NECK WO CONTRAST  Result Date: 05/04/2021 CLINICAL DATA:  Acute strokes on MRI brain EXAM: MRA HEAD WITHOUT CONTRAST MRA NECK WITHOUT CONTRAST TECHNIQUE: Angiographic images of the Circle of Willis were obtained using MRA technique without intravenous contrast. Angiographic images of the neck were obtained using MRA technique without intravenous contrast. Carotid stenosis measurements (when applicable) are obtained utilizing NASCET criteria, using the distal internal carotid diameter as the denominator. COMPARISON:  Correlation made with prior MRI brain 05/03/2021, CTA neck 10/29/2020 FINDINGS: MRA HEAD Motion artifact is present. Intracranial internal carotid arteries are patent with atherosclerotic irregularity. Anterior cerebral arteries are patent. Right A1 ACA is no longer identified launch could be related artifact in the area or new stenosis. Anterior communicating artery is present. Middle cerebral arteries are patent with multifocal atherosclerotic irregularity. Intracranial vertebral arteries, basilar artery, posterior cerebral arteries are patent. Atherosclerotic irregularity of the posterior cerebral arteries. No aneurysm identified. MRA NECK Common, internal, and external carotid arteries are patent. There is no hemodynamically significant stenosis at the ICA origins. Retropharyngeal course. No evidence of dissection. Extracranial vertebral arteries are patent. Origins are not well evaluated due to artifact. No evidence of dissection. IMPRESSION: Suboptimal evaluation due to motion artifact. No large vessel occlusion. No  hemodynamically significant stenosis in the neck. Multifocal intracranial atherosclerosis also seen on 2021 CTA. There is no flow seen within the right A1 ACA, which could be related to artifact in the area or new stenosis. Electronically Signed   By: Guadlupe Spanish M.D.   On: 05/04/2021 14:44   MR Brain Wo Contrast (neuro protocol)  Result Date: 05/04/2021 CLINICAL DATA:  Initial evaluation for neuro deficit, stroke suspected. EXAM: MRI HEAD WITHOUT CONTRAST TECHNIQUE: Multiplanar, multiecho pulse sequences of the brain and surrounding structures were obtained without intravenous contrast. COMPARISON:  Prior CT from earlier the same day. FINDINGS: Brain: Diffuse prominence of the CSF containing spaces compatible generalized age-related cerebral atrophy. Patchy and confluent T2/FLAIR hyperintensity within the periventricular and deep white matter both cerebral hemispheres as well as the pons, most consistent with chronic small vessel ischemic disease, fairly advanced in nature. Few scatter remote lacunar infarcts present about the bilateral basal ganglia with associated chronic hemosiderin staining. Small remote cortical/subcortical infarct at the left occipital lobe. 1.2 cm acute ischemic infarct present at the right ventral medial thalamus. Additional 8 mm acute ischemic cortical/subcortical infarct at the parasagittal posterior right frontal lobe (series 5, image 90). Additional acute to early subacute infarct measuring 1.3 cm present at the left frontal corona radiata (series 5, image 82). No associated hemorrhage or mass  effect about these areas of ischemia. Otherwise, gray-white matter differentiation maintained. No other areas of chronic cortical infarction. No acute intracranial hemorrhage. Few additional scattered chronic micro hemorrhages noted, likely small vessel and/or hypertensive in nature. No mass lesion, midline shift or mass effect. Mild ex vacuo dilatation of the right lateral ventricle  related to a chronic right basal ganglia infarct. No hydrocephalus. No extra-axial fluid collection. Pituitary gland suprasellar region normal. Midline structures intact. Vascular: Major intracranial vascular flow voids are maintained. Skull and upper cervical spine: Craniocervical junction within normal limits. Bone marrow signal intensity normal. No focal marrow replacing lesion. No scalp soft tissue abnormality. Sinuses/Orbits: Patient status post bilateral ocular lens replacement. Globes and orbital soft tissues demonstrate no acute finding. Paranasal sinuses are clear. No mastoid effusion. Inner ear structures grossly normal. Other: None. IMPRESSION: 1. 1.2 cm acute ischemic nonhemorrhagic right thalamic infarct. 2. Additional 8 mm acute ischemic nonhemorrhagic cortical/subcortical infarct involving the parasagittal posterior right frontal lobe. 3. Additional 1.3 cm acute to early subacute ischemic nonhemorrhagic left frontal corona radiata infarct. 4. Underlying age-related cerebral atrophy with advanced chronic small vessel ischemic disease, with a few additional scattered remote lacunar infarcts as above. Electronically Signed   By: Rise Mu M.D.   On: 05/04/2021 00:46   DG Chest Port 1 View  Result Date: 05/25/2021 CLINICAL DATA:  Weakness.  Lethargy. EXAM: PORTABLE CHEST 1 VIEW COMPARISON:  10/26/2020 FINDINGS: The heart size and mediastinal contours are within normal limits. Tortuosity of thoracic aorta again noted. Both lungs are clear. The visualized skeletal structures are unremarkable. IMPRESSION: No active disease. Electronically Signed   By: Danae Orleans M.D.   On: 05/25/2021 16:32   ECHOCARDIOGRAM COMPLETE  Result Date: 05/04/2021    ECHOCARDIOGRAM REPORT   Patient Name:   LOMA DUBUQUE Toran Date of Exam: 05/04/2021 Medical Rec #:  [id number]       Height:       60.0 in Accession #:    [id number]      Weight:       197.0 lb Date of Birth:  06-Oct-1946       BSA:          1.855 m  Patient Age:    74 years        BP:           157/81 mmHg Patient Gender: F               HR:           62 bpm. Exam Location:  Inpatient Procedure: 2D Echo, Color Doppler and Cardiac Doppler Indications:    Stroke I63.9  History:        Patient has prior history of Echocardiogram examinations, most                 recent 10/28/2020. Risk Factors:Hypertension and Diabetes.                 COVID-19.  Sonographer:    Tiffany Dance Referring Phys: [id number] ASIA B ZIERLE-GHOSH IMPRESSIONS  1. Left ventricular ejection fraction, by estimation, is 60 to 65%. The left ventricle has normal function. The left ventricle has no regional wall motion abnormalities. Left ventricular diastolic parameters were normal.  2. Right ventricular systolic function is normal. The right ventricular size is normal.  3. The mitral valve is normal in structure. Trivial mitral valve regurgitation. No evidence of mitral stenosis.  4. The aortic valve is tricuspid. There is mild calcification of the  aortic valve. Aortic valve regurgitation is not visualized. Mild aortic valve sclerosis is present, with no evidence of aortic valve stenosis.  5. The inferior vena cava is normal in size with greater than 50% respiratory variability, suggesting right atrial pressure of 3 mmHg. FINDINGS  Left Ventricle: Left ventricular ejection fraction, by estimation, is 60 to 65%. The left ventricle has normal function. The left ventricle has no regional wall motion abnormalities. The left ventricular internal cavity size was normal in size. There is  no left ventricular hypertrophy. Left ventricular diastolic parameters were normal. Right Ventricle: The right ventricular size is normal. No increase in right ventricular wall thickness. Right ventricular systolic function is normal. Left Atrium: Left atrial size was normal in size. Right Atrium: Right atrial size was normal in size. Pericardium: There is no evidence of pericardial effusion. Mitral Valve: The mitral  valve is normal in structure. Trivial mitral valve regurgitation. No evidence of mitral valve stenosis. Tricuspid Valve: The tricuspid valve is normal in structure. Tricuspid valve regurgitation is not demonstrated. No evidence of tricuspid stenosis. Aortic Valve: The aortic valve is tricuspid. There is mild calcification of the aortic valve. Aortic valve regurgitation is not visualized. Mild aortic valve sclerosis is present, with no evidence of aortic valve stenosis. Pulmonic Valve: The pulmonic valve was normal in structure. Pulmonic valve regurgitation is not visualized. No evidence of pulmonic stenosis. Aorta: The aortic root is normal in size and structure. Venous: The inferior vena cava is normal in size with greater than 50% respiratory variability, suggesting right atrial pressure of 3 mmHg. IAS/Shunts: No atrial level shunt detected by color flow Doppler.  LEFT VENTRICLE PLAX 2D LVIDd:         3.50 cm  Diastology LVIDs:         2.30 cm  LV e' medial:    3.81 cm/s LV PW:         1.20 cm  LV E/e' medial:  15.1 LV IVS:        1.10 cm  LV e' lateral:   4.46 cm/s LVOT diam:     1.80 cm  LV E/e' lateral: 12.9 LV SV:         49 LV SV Index:   27 LVOT Area:     2.54 cm  RIGHT VENTRICLE             IVC RV Basal diam:  2.60 cm     IVC diam: 1.20 cm RV S prime:     12.90 cm/s TAPSE (M-mode): 1.8 cm LEFT ATRIUM             Index       RIGHT ATRIUM          Index LA diam:        2.70 cm 1.46 cm/m  RA Area:     7.49 cm LA Vol (A2C):   34.0 ml 18.33 ml/m RA Volume:   12.20 ml 6.58 ml/m LA Vol (A4C):   21.4 ml 11.54 ml/m LA Biplane Vol: 27.1 ml 14.61 ml/m  AORTIC VALVE LVOT Vmax:   85.70 cm/s LVOT Vmean:  54.800 cm/s LVOT VTI:    0.194 m  AORTA Ao Root diam: 3.00 cm Ao Asc diam:  3.00 cm MITRAL VALVE MV Area (PHT): 2.91 cm    SHUNTS MV Decel Time: 261 msec    Systemic VTI:  0.19 m MV E velocity: 57.50 cm/s  Systemic Diam: 1.80 cm MV A velocity: 86.90 cm/s MV E/A ratio:  0.66 Charlton Haws MD Electronically signed  by Charlton Haws MD Signature Date/Time: 05/04/2021/4:52:22 PM    Final    ECHOCARDIOGRAM LIMITED BUBBLE STUDY  Result Date: 05/05/2021    ECHOCARDIOGRAM LIMITED REPORT   Patient Name:   JEANNA GIUFFRE Carmer Date of Exam: 05/05/2021 Medical Rec #:  [id number]       Height:       60.0 in Accession #:    [id number]      Weight:       197.0 lb Date of Birth:  [birth year]       BSA:          1.855 m Patient Age:    74 years        BP:           148/71 mmHg Patient Gender: F               HR:           62 bpm. Exam Location:  Inpatient Procedure: Saline Contrast Bubble Study and Limited Echo Indications:    Stroke  History:        Patient has prior history of Echocardiogram examinations, most                 recent 05/04/2021. Stroke, Signs/Symptoms:Altered Mental Status;                 Risk Factors:Hypertension and Diabetes.  Sonographer:    Sheralyn Boatman RDCS Referring Phys: [id number] [facility]  Sonographer Comments: Technically difficult study due to poor echo windows and patient is morbidly obese. Image acquisition challenging due to patient body habitus. Limited bubble study for stroke. IMPRESSIONS  1. Limited study to evaluate for intracardiac shunt.  2. Agitated saline contrast bubble study was negative, with no evidence of any interatrial shunt. Technically difficult study, but no shunting seen on bubble study FINDINGS  Left Ventricle: Left ventricular ejection fraction, by estimation, is 60 to 65%. The left ventricle has normal function. Right Ventricle: Right ventricular systolic function is normal. IAS/Shunts: Agitated saline contrast was given intravenously to evaluate for intracardiac shunting. Agitated saline contrast bubble study was negative, with no evidence of any interatrial shunt. Epifanio Lesches MD Electronically signed by Epifanio Lesches MD Signature Date/Time: 05/05/2021/1:41:16 PM    Final    ECHO TEE  Result Date: 05/06/2021    TRANSESOPHOGEAL ECHO REPORT   Patient Name:   NOELLE SEASE  Emmick Date of Exam: 05/06/2021 Medical Rec #:  [id number]       Height:       60.0 in Accession #:    [id number]      Weight:       197.0 lb Date of Birth:  1946/04/09       BSA:          1.855 m Patient Age:    74 years        BP:           174/88 mmHg Patient Gender: F               HR:           71 bpm. Exam Location:  Inpatient Procedure: Transesophageal Echo, Color Doppler and Saline Contrast Bubble Study Indications:     Stroke I63.9  History:         Patient has prior history of Echocardiogram examinations, most                  recent  05/05/2021. Stroke; Risk Factors:Hypertension and                  Diabetes. COVID-19.  Sonographer:     Tiffany Dance Referring Phys:  [id number] Roe [facility] Diagnosing Phys: Kristeen Miss MD PROCEDURE: The transesophogeal probe was passed without difficulty through the esophogus of the patient. Sedation performed by different physician. The patient was monitored while under deep sedation. Anesthestetic sedation was provided intravenously by Anesthesiology: 131.63mg  of Propofol,  of Lidocaine. The patient developed no complications during the procedure. IMPRESSIONS  1. Left ventricular ejection fraction, by estimation, is 60 to 65%. The left ventricle has normal function. The left ventricle has no regional wall motion abnormalities.  2. Right ventricular systolic function is normal. The right ventricular size is normal.  3. No left atrial/left atrial appendage thrombus was detected.  4. The mitral valve is grossly normal. Mild mitral valve regurgitation.  5. The aortic valve is normal in structure. Aortic valve regurgitation is trivial. FINDINGS  Left Ventricle: Left ventricular ejection fraction, by estimation, is 60 to 65%. The left ventricle has normal function. The left ventricle has no regional wall motion abnormalities. The left ventricular internal cavity size was normal in size. Right Ventricle: The right ventricular size is normal. Right vetricular wall  thickness was not well visualized. Right ventricular systolic function is normal. Left Atrium: Left atrial size was normal in size. No left atrial/left atrial appendage thrombus was detected. Right Atrium: Right atrial size was normal in size. Pericardium: There is no evidence of pericardial effusion. Mitral Valve: The mitral valve is grossly normal. Mild mitral valve regurgitation. Tricuspid Valve: The tricuspid valve is normal in structure. Tricuspid valve regurgitation is mild. Aortic Valve: The aortic valve is normal in structure. Aortic valve regurgitation is trivial. Pulmonic Valve: The pulmonic valve was not well visualized. Pulmonic valve regurgitation is not visualized. Aorta: The aortic root and ascending aorta are structurally normal, with no evidence of dilitation. There is minimal (Grade I) plaque. IAS/Shunts: No atrial level shunt detected by color flow Doppler. Agitated saline contrast was given intravenously to evaluate for intracardiac shunting. Kristeen Miss MD Electronically signed by Kristeen Miss MD Signature Date/Time: 05/06/2021/4:16:32 PM    Final     Catarina Hartshorn, DO  [facility]  If 7PM-7AM, please contact night-coverage [email or web address] Password [email or web address] 05/26/2021, 8:39 AM   LOS: 1 day

## 2021-05-26 NOTE — Progress Notes (Signed)
Pt requested purewick removed and to walk to bathroom. Pt ambulated with only standby assist to bathroom, gait steady. Pt incontinent of urine while ambulating. Pt had small soft brown BM and voided clear yellow urine. Pt given peri-care and clean gown, bed pad changed. Pt tolerated ambulation without difficulty.

## 2021-05-27 LAB — CBC
HCT: 36.4 % (ref 36.0–46.0)
Hemoglobin: 12.2 g/dL (ref 12.0–15.0)
MCH: 29.1 pg (ref 26.0–34.0)
MCHC: 33.5 g/dL (ref 30.0–36.0)
MCV: 86.9 fL (ref 80.0–100.0)
Platelets: 233 10*3/uL (ref 150–400)
RBC: 4.19 MIL/uL (ref 3.87–5.11)
RDW: 13.1 % (ref 11.5–15.5)
WBC: 8.1 10*3/uL (ref 4.0–10.5)
nRBC: 0 % (ref 0.0–0.2)

## 2021-05-27 LAB — BASIC METABOLIC PANEL
Anion gap: 8 (ref 5–15)
BUN: 23 mg/dL (ref 8–23)
CO2: 26 mmol/L (ref 22–32)
Calcium: 8.6 mg/dL — ABNORMAL LOW (ref 8.9–10.3)
Chloride: 106 mmol/L (ref 98–111)
Creatinine, Ser: 0.94 mg/dL (ref 0.44–1.00)
GFR, Estimated: >60 mL/min (ref >60)
Glucose, Bld: 98 mg/dL (ref 70–99)
Potassium: 3.4 mmol/L — ABNORMAL LOW (ref 3.5–5.1)
Sodium: 140 mmol/L (ref 135–145)

## 2021-05-27 LAB — GLUCOSE, CAPILLARY
Glucose-Capillary: 94 mg/dL (ref 70–99)
Glucose-Capillary: 233 mg/dL — ABNORMAL HIGH (ref 70–99)

## 2021-05-27 LAB — MAGNESIUM: Magnesium: 1.5 mg/dL — ABNORMAL LOW (ref 1.7–2.4)

## 2021-05-27 MED ORDER — AMLODIPINE BESYLATE 5 MG PO TABS: 5.0000 mg | ORAL_TABLET | Freq: Every day | ORAL | 1 refills | Status: AC

## 2021-05-27 MED ORDER — MAGNESIUM SULFATE 2 GM/50ML IV SOLN
2.0000 g | Freq: Once | INTRAVENOUS | Status: AC
  Administered 2021-05-27: 2 g via INTRAVENOUS
  Filled 2021-05-27: qty 50

## 2021-05-27 MED ORDER — CLOPIDOGREL BISULFATE 75 MG PO TABS: 75.0000 mg | ORAL_TABLET | Freq: Every day | ORAL | 1 refills | Status: DC

## 2021-05-27 MED ORDER — LOSARTAN POTASSIUM 25 MG PO TABS: 25.0000 mg | ORAL_TABLET | Freq: Every day | ORAL | 1 refills | Status: AC

## 2021-05-27 MED ORDER — AMOXICILLIN-POT CLAVULANATE 875-125 MG PO TABS: 1.0000 | ORAL_TABLET | Freq: Two times a day (BID) | ORAL | 0 refills | Status: DC

## 2021-05-27 MED ORDER — AMOXICILLIN-POT CLAVULANATE 875-125 MG PO TABS: 1.0000 | ORAL_TABLET | Freq: Two times a day (BID) | ORAL | Status: DC

## 2021-05-27 NOTE — Plan of Care (Addendum)
  Problem: Acute Rehab PT Goals(only PT should resolve) Goal: Patient Will Transfer Sit To/From Stand Outcome: Progressing Flowsheets (Taken 05/27/2021 1236) Patient will transfer sit to/from stand: Independently Goal: Pt Will Transfer Bed To Chair/Chair To Bed Outcome: Progressing Flowsheets (Taken 05/27/2021 1236) Pt will Transfer Bed to Chair/Chair to Bed: Independently Goal: Pt Will Ambulate Outcome: Progressing Flowsheets (Taken 05/27/2021 1236) Pt will Ambulate:  > 125 feet  Independently Goal: Pt Will Go Up/Down Stairs Outcome: Progressing Flowsheets (Taken 05/27/2021 1236) Pt will Go Up / Down Stairs:  3-5 stairs  with supervision  with rail(s)   12:37 PM, 05/27/21 Ocie Cornfield SPT  1:39 PM, 05/27/21 Ocie Bob, MPT Physical Therapist with [facility] [phone] office [phone] mobile phone

## 2021-05-27 NOTE — Discharge Summary (Signed)
Physician Discharge Summary  Caitlin Harrington TJQ:[id number] DOB: Mar 31, 1946 DOA: 05/25/2021  PCP: Vernie Shanks, MD  Admit date: 05/25/2021 Discharge date: 05/27/2021  Admitted From: Home Disposition:  Home  Recommendations for Outpatient Follow-up:  Follow up with PCP in 1-2 weeks Please obtain BMP/CBC in one week   Home Health: [facility]   Discharge Condition: Stable CODE STATUS: FULL Diet recommendation: Heart Healthy / Carb Modified    Brief/Interim Summary: 75 year old female with history of stroke, hypertension, diabetes mellitus, OSA, hyperlipidemia, asthma presenting with 1 day history of altered mental status.  The patient was recently admitted to the hospital from 05/03/2021 to 05/07/2021 where she was treated for multifocal ischemic stroke.  The patient was sent home with aspirin 81 mg daily.  Patient endorses compliance with her medications.  However in speaking with the patient's family, it appears that the patient has poor insight regarding her medications and there are some questions about her compliance.  Nevertheless, there is been no reported fevers, chills, chest pain, shortness of breath, nausea, vomiting, direct abdominal pain.  Apparently, the patient was with her son yesterday and while taking a walk, the patient began feeling dizzy and unsteady.  Family noted her to be confused.  As result, EMS was activated and the patient was brought to emergency department for further evaluation.  The family checked her CBG and it was noted to be 7.  Upon EMS arrival, it was noted to be 165. In the emergency department, the patient was afebrile hemodynamically stable with oxygen saturation 98% on room air.  BMP showed a sodium 136, potassium 3.9, serum creatinine 1.58.  Chest x-ray was negative.  CT of the brain was negative for any acute findings.  VBG showed pH 7.410/38/76/24.  UA showed >50 WBC.  The patient was started on IV fluids and ceftriaxone.  Discharge Diagnoses:  Acute  metabolic encephalopathy -Multifactorial including hypoglycemia, dehydration and UTI -Continue IV ceftriaxone -Continue IV fluids -hold atarax -resolved, back to baseline at time of dc   UTI -Concern for UTI -Continue ceftriaxone pending culture data -prelim culture GNR and Lactobacillus -d/c home with amox/clav x 4 more days   Hypoglycemia -Monitor CBGs -decreased lantus during hospitalization -no further hypoglycemia during hospitalization -patient was taking insulin incorrectly at home-->lantus bid even though it was listed as Qday -d/c glipizide -restart metformin and glipizide at time of d/c   Uncontrolled diabetes mellitus type 2 with hyperglycemia -05/03/2021 hemoglobin A1c 10.7 -holding glipizide and metformin -decrease lantus to once daily -novolog ISS   Acute on CKD 3b -baseline creatinine 1.0-1.3 -serum creatinine peaked 1.6 -serum creatinine 0.94 of day of d/c -improved with IVF   Recent multifocal ischemic stroke -Neurology recommendation was for aspirin and Plavix -Reviewed the medical record shows the patient was discharged home on aspirin -Restart Plavix -PT evaluation>>[facility] -plavix and aspirin x 30 days, then aspirin alone   Essential hypertension -Continue amlodipine and carvedilol -Continue terazosin -pt tolerated amlodipine without difficulty -added losartan 25 mg daily   Hypothyroidism -Continue Synthroid   Hyperlipidemia -Continue statin   Morbid Obesity -BMI 39.06 -lifestyle modification     Discharge Instructions   Allergies as of 05/27/2021       Reactions   Amlodipine Other (See Comments)   Other reaction(s): headache   Atorvastatin Hives, Rash   chills   Doxycycline Rash   Meloxicam Hives, Rash   Motrin [ibuprofen] Hives   Statins Hives, Rash        Medication List  STOP taking these medications    glipiZIDE 5 MG tablet Commonly known as: GLUCOTROL   linagliptin 5 MG Tabs tablet Commonly known as:  TRADJENTA   lisinopril 20 MG tablet Commonly known as: ZESTRIL       TAKE these medications    albuterol 108 (90 Base) MCG/ACT inhaler Commonly known as: VENTOLIN HFA Inhale 2 puffs into the lungs 2 (two) times daily as needed for shortness of breath or wheezing.   allopurinol 300 MG tablet Commonly known as: ZYLOPRIM Take 300 mg by mouth daily.   amLODipine 5 MG tablet Commonly known as: NORVASC Take 1 tablet (5 mg total) by mouth daily. Start taking on: May 28, 2021 What changed: See the new instructions.   amoxicillin-clavulanate 875-125 MG tablet Commonly known as: AUGMENTIN Take 1 tablet by mouth every 12 (twelve) hours.   aspirin 81 MG EC tablet Take 1 tablet (81 mg total) by mouth daily. Swallow whole.   blood glucose meter kit and supplies Kit Dispense based on patient and insurance preference. Use up to four times daily as directed. (FOR ICD-9 250.00, 250.01).   Blood Pressure Kit Kit See admin instructions.   carvedilol 6.25 MG tablet Commonly known as: COREG Take 6.25 mg by mouth 2 (two) times daily with a meal.   clopidogrel 75 MG tablet Commonly known as: PLAVIX Take 1 tablet (75 mg total) by mouth daily. Start taking on: May 28, 2021   diphenhydrAMINE 25 MG tablet Commonly known as: BENADRYL Take 25 mg by mouth at bedtime as needed for sleep or allergies.   Euthyrox 25 MCG tablet Generic drug: levothyroxine Take 25 mcg by mouth daily.   hydrOXYzine 25 MG tablet Commonly known as: ATARAX/VISTARIL Take 25 mg by mouth daily as needed for anxiety.   insulin glargine 100 UNIT/ML Solostar Pen Commonly known as: LANTUS Inject 15 Units into the skin daily.   Insulin Pen Needle 30G X 8 MM Misc Commonly known as: NOVOFINE Inject 10 each into the skin as directed.   losartan 25 MG tablet Commonly known as: COZAAR Take 1 tablet (25 mg total) by mouth daily. What changed:  medication strength how much to take   metFORMIN 500 MG  tablet Commonly known as: GLUCOPHAGE Take 500 mg by mouth daily.   PARoxetine 40 MG tablet Commonly known as: PAXIL Take 40 mg by mouth every morning.   rosuvastatin 20 MG tablet Commonly known as: CRESTOR Take 1 tablet (20 mg total) by mouth daily.   sitaGLIPtin 100 MG tablet Commonly known as: JANUVIA Take 100 mg by mouth daily.   terazosin 5 MG capsule Commonly known as: HYTRIN Take 5 mg by mouth daily.   vitamin B-12 500 MCG tablet Commonly known as: CYANOCOBALAMIN Take 1 tablet (500 mcg total) by mouth daily.        Allergies  Allergen Reactions   Amlodipine Other (See Comments)    Other reaction(s): headache   Atorvastatin Hives and Rash    chills   Doxycycline Rash   Meloxicam Hives and Rash   Motrin [Ibuprofen] Hives   Statins Hives and Rash    Consultations: none   Procedures/Studies: CT Head Wo Contrast  Result Date: 05/25/2021 CLINICAL DATA:  Altered mental status.  Hypoglycemia. EXAM: CT HEAD WITHOUT CONTRAST TECHNIQUE: Contiguous axial images were obtained from the base of the skull through the vertex without intravenous contrast. COMPARISON:  Head CT and brain MRI 05/04/2021 FINDINGS: Brain: No hemorrhage. Expected evolution of right thalamic infarct. Additional small infarcts  that were acute on prior MRI have no definite CT correlate. Background atrophy and chronic small vessel ischemia. Stable ventricular size and appearance with slight enlargement of right lateral ventricle compared to left. There are remote lacunar infarcts in the left basal ganglia. Minimal encephalomalacia in the left parietooccipital lobe is unchanged. No subdural or extra-axial collection. Vascular: Prominent skull base atherosclerosis. No hyperdense vessel or unexpected calcification. Skull: No fracture or focal lesion. Sinuses/Orbits: Paranasal sinuses and mastoid air cells are clear. The visualized orbits are unremarkable. Other: None. IMPRESSION: 1. No acute intracranial  abnormality. 2. Expected evolution of right thalamic infarct from prior exam. Additional small infarcts that were acute on prior MRI have no definite CT correlate. 3. Background atrophy and chronic small vessel ischemia. Electronically Signed   By: Keith Rake M.D.   On: 05/25/2021 16:29   CT HEAD WO CONTRAST  Result Date: 05/04/2021 CLINICAL DATA:  Mental status change.  Possible stroke. EXAM: CT HEAD WITHOUT CONTRAST TECHNIQUE: Contiguous axial images were obtained from the base of the skull through the vertex without intravenous contrast. COMPARISON:  05/04/2021 at 3:23 a.m., 05/03/2021 as well as MRI 05/03/2021 FINDINGS: Brain: Ventricles, cisterns and other CSF spaces are unchanged. Evidence of patient's known acute right lamina infarct unchanged. Other too small bilateral acute infarcts seen on recent MRI not visualized by CT. Moderate chronic ischemic microvascular disease. Old lacunar infarct over the right lentiform nucleus. No mass, mass effect or shift of midline structures. No acute hemorrhage. Vascular: No hyperdense vessel or unexpected calcification. Skull: Normal. Negative for fracture or focal lesion. Sinuses/Orbits: No acute finding. Other: None. IMPRESSION: 1. Evidence of patient's known acute right thalamus infarct unchanged. Other two small bilateral acute infarcts seen on recent MRI are not visualized by CT. No acute hemorrhage. 2. Moderate chronic ischemic microvascular disease. Old right basal ganglia lacunar infarct. Electronically Signed   By: Marin Olp M.D.   On: 05/04/2021 11:46   CT HEAD WO CONTRAST  Result Date: 05/04/2021 CLINICAL DATA:  Altered mental status EXAM: CT HEAD WITHOUT CONTRAST TECHNIQUE: Contiguous axial images were obtained from the base of the skull through the vertex without intravenous contrast. COMPARISON:  05/03/2021 FINDINGS: Brain: The previously seen acute acute right thalamic infarct on MRI is again noted. The previously seen smaller bifrontal  infarcts not definitively seen by CT. No hemorrhage. No new areas of infarction. Old right basal ganglia lacunar infarct. Chronic small vessel disease throughout the deep white matter. Vascular: No hyperdense vessel or unexpected calcification. Skull: No acute calvarial abnormality. Sinuses/Orbits: No acute findings Other: None IMPRESSION: Acute right thalamic infarct again seen as noted on prior MRI. Other smaller frontal infarcts not visualized by CT. No hemorrhage. Electronically Signed   By: Rolm Baptise M.D.   On: 05/04/2021 03:39   CT HEAD WO CONTRAST  Result Date: 05/03/2021 CLINICAL DATA:  Headache and generalized weakness since last evening. EXAM: CT HEAD WITHOUT CONTRAST TECHNIQUE: Contiguous axial images were obtained from the base of the skull through the vertex without intravenous contrast. COMPARISON:  Head CT 10/26/2020 FINDINGS: Brain: Stable age advanced cerebral atrophy, ventriculomegaly and periventricular white matter disease. Stable remote right basal ganglia infarct with encephalomalacia and ex vacuo dilatation of the frontal horn of the right lateral ventricle. No extra-axial fluid collections are identified. No CT findings for acute hemispheric infarction or intracranial hemorrhage. No mass lesions. The brainstem and cerebellum are normal. Vascular: Vascular calcifications but no aneurysm or hyperdense vessels. Skull: No skull fracture or bone lesions. Sinuses/Orbits:  The paranasal sinuses and mastoid air cells are clear. The globes are intact. Other: No scalp lesions or scalp hematoma. IMPRESSION: 1. Stable age advanced cerebral atrophy, ventriculomegaly and periventricular white matter disease. 2. Stable remote right basal ganglia infarct. 3. No acute intracranial findings or mass lesions. Electronically Signed   By: Marijo Sanes M.D.   On: 05/03/2021 18:20   MR ANGIO HEAD WO CONTRAST  Result Date: 05/04/2021 CLINICAL DATA:  Acute strokes on MRI brain EXAM: MRA HEAD WITHOUT  CONTRAST MRA NECK WITHOUT CONTRAST TECHNIQUE: Angiographic images of the Circle of Willis were obtained using MRA technique without intravenous contrast. Angiographic images of the neck were obtained using MRA technique without intravenous contrast. Carotid stenosis measurements (when applicable) are obtained utilizing NASCET criteria, using the distal internal carotid diameter as the denominator. COMPARISON:  Correlation made with prior MRI brain 05/03/2021, CTA neck 10/29/2020 FINDINGS: MRA HEAD Motion artifact is present. Intracranial internal carotid arteries are patent with atherosclerotic irregularity. Anterior cerebral arteries are patent. Right A1 ACA is no longer identified launch could be related artifact in the area or new stenosis. Anterior communicating artery is present. Middle cerebral arteries are patent with multifocal atherosclerotic irregularity. Intracranial vertebral arteries, basilar artery, posterior cerebral arteries are patent. Atherosclerotic irregularity of the posterior cerebral arteries. No aneurysm identified. MRA NECK Common, internal, and external carotid arteries are patent. There is no hemodynamically significant stenosis at the ICA origins. Retropharyngeal course. No evidence of dissection. Extracranial vertebral arteries are patent. Origins are not well evaluated due to artifact. No evidence of dissection. IMPRESSION: Suboptimal evaluation due to motion artifact. No large vessel occlusion. No hemodynamically significant stenosis in the neck. Multifocal intracranial atherosclerosis also seen on 2021 CTA. There is no flow seen within the right A1 ACA, which could be related to artifact in the area or new stenosis. Electronically Signed   By: Macy Mis M.D.   On: 05/04/2021 14:44   MR ANGIO NECK WO CONTRAST  Result Date: 05/04/2021 CLINICAL DATA:  Acute strokes on MRI brain EXAM: MRA HEAD WITHOUT CONTRAST MRA NECK WITHOUT CONTRAST TECHNIQUE: Angiographic images of the Circle  of Willis were obtained using MRA technique without intravenous contrast. Angiographic images of the neck were obtained using MRA technique without intravenous contrast. Carotid stenosis measurements (when applicable) are obtained utilizing NASCET criteria, using the distal internal carotid diameter as the denominator. COMPARISON:  Correlation made with prior MRI brain 05/03/2021, CTA neck 10/29/2020 FINDINGS: MRA HEAD Motion artifact is present. Intracranial internal carotid arteries are patent with atherosclerotic irregularity. Anterior cerebral arteries are patent. Right A1 ACA is no longer identified launch could be related artifact in the area or new stenosis. Anterior communicating artery is present. Middle cerebral arteries are patent with multifocal atherosclerotic irregularity. Intracranial vertebral arteries, basilar artery, posterior cerebral arteries are patent. Atherosclerotic irregularity of the posterior cerebral arteries. No aneurysm identified. MRA NECK Common, internal, and external carotid arteries are patent. There is no hemodynamically significant stenosis at the ICA origins. Retropharyngeal course. No evidence of dissection. Extracranial vertebral arteries are patent. Origins are not well evaluated due to artifact. No evidence of dissection. IMPRESSION: Suboptimal evaluation due to motion artifact. No large vessel occlusion. No hemodynamically significant stenosis in the neck. Multifocal intracranial atherosclerosis also seen on 2021 CTA. There is no flow seen within the right A1 ACA, which could be related to artifact in the area or new stenosis. Electronically Signed   By: Macy Mis M.D.   On: 05/04/2021 14:44   MR Brain  Wo Contrast (neuro protocol)  Result Date: 05/04/2021 CLINICAL DATA:  Initial evaluation for neuro deficit, stroke suspected. EXAM: MRI HEAD WITHOUT CONTRAST TECHNIQUE: Multiplanar, multiecho pulse sequences of the brain and surrounding structures were obtained  without intravenous contrast. COMPARISON:  Prior CT from earlier the same day. FINDINGS: Brain: Diffuse prominence of the CSF containing spaces compatible generalized age-related cerebral atrophy. Patchy and confluent T2/FLAIR hyperintensity within the periventricular and deep white matter both cerebral hemispheres as well as the pons, most consistent with chronic small vessel ischemic disease, fairly advanced in nature. Few scatter remote lacunar infarcts present about the bilateral basal ganglia with associated chronic hemosiderin staining. Small remote cortical/subcortical infarct at the left occipital lobe. 1.2 cm acute ischemic infarct present at the right ventral medial thalamus. Additional 8 mm acute ischemic cortical/subcortical infarct at the parasagittal posterior right frontal lobe (series 5, image 90). Additional acute to early subacute infarct measuring 1.3 cm present at the left frontal corona radiata (series 5, image 82). No associated hemorrhage or mass effect about these areas of ischemia. Otherwise, gray-white matter differentiation maintained. No other areas of chronic cortical infarction. No acute intracranial hemorrhage. Few additional scattered chronic micro hemorrhages noted, likely small vessel and/or hypertensive in nature. No mass lesion, midline shift or mass effect. Mild ex vacuo dilatation of the right lateral ventricle related to a chronic right basal ganglia infarct. No hydrocephalus. No extra-axial fluid collection. Pituitary gland suprasellar region normal. Midline structures intact. Vascular: Major intracranial vascular flow voids are maintained. Skull and upper cervical spine: Craniocervical junction within normal limits. Bone marrow signal intensity normal. No focal marrow replacing lesion. No scalp soft tissue abnormality. Sinuses/Orbits: Patient status post bilateral ocular lens replacement. Globes and orbital soft tissues demonstrate no acute finding. Paranasal sinuses are  clear. No mastoid effusion. Inner ear structures grossly normal. Other: None. IMPRESSION: 1. 1.2 cm acute ischemic nonhemorrhagic right thalamic infarct. 2. Additional 8 mm acute ischemic nonhemorrhagic cortical/subcortical infarct involving the parasagittal posterior right frontal lobe. 3. Additional 1.3 cm acute to early subacute ischemic nonhemorrhagic left frontal corona radiata infarct. 4. Underlying age-related cerebral atrophy with advanced chronic small vessel ischemic disease, with a few additional scattered remote lacunar infarcts as above. Electronically Signed   By: Jeannine Boga M.D.   On: 05/04/2021 00:46   DG Chest Port 1 View  Result Date: 05/25/2021 CLINICAL DATA:  Weakness.  Lethargy. EXAM: PORTABLE CHEST 1 VIEW COMPARISON:  10/26/2020 FINDINGS: The heart size and mediastinal contours are within normal limits. Tortuosity of thoracic aorta again noted. Both lungs are clear. The visualized skeletal structures are unremarkable. IMPRESSION: No active disease. Electronically Signed   By: Marlaine Hind M.D.   On: 05/25/2021 16:32   ECHOCARDIOGRAM COMPLETE  Result Date: 05/04/2021    ECHOCARDIOGRAM REPORT   Patient Name:   Caitlin Harrington Date of Exam: 05/04/2021 Medical Rec #:  [id number]       Height:       60.0 in Accession #:    [id number]      Weight:       197.0 lb Date of Birth:  [birth year]       BSA:          1.855 m Patient Age:    75 years        BP:           157/81 mmHg Patient Gender: F               HR:  62 bpm. Exam Location:  Inpatient Procedure: 2D Echo, Color Doppler and Cardiac Doppler Indications:    Stroke I63.9  History:        Patient has prior history of Echocardiogram examinations, most                 recent 10/28/2020. Risk Factors:Hypertension and Diabetes.                 COVID-19.  Sonographer:    Tiffany Dance Referring Phys: [id number] ASIA B Woodruff  1. Left ventricular ejection fraction, by estimation, is 60 to 65%. The left ventricle has  normal function. The left ventricle has no regional wall motion abnormalities. Left ventricular diastolic parameters were normal.  2. Right ventricular systolic function is normal. The right ventricular size is normal.  3. The mitral valve is normal in structure. Trivial mitral valve regurgitation. No evidence of mitral stenosis.  4. The aortic valve is tricuspid. There is mild calcification of the aortic valve. Aortic valve regurgitation is not visualized. Mild aortic valve sclerosis is present, with no evidence of aortic valve stenosis.  5. The inferior vena cava is normal in size with greater than 50% respiratory variability, suggesting right atrial pressure of 3 mmHg. FINDINGS  Left Ventricle: Left ventricular ejection fraction, by estimation, is 60 to 65%. The left ventricle has normal function. The left ventricle has no regional wall motion abnormalities. The left ventricular internal cavity size was normal in size. There is  no left ventricular hypertrophy. Left ventricular diastolic parameters were normal. Right Ventricle: The right ventricular size is normal. No increase in right ventricular wall thickness. Right ventricular systolic function is normal. Left Atrium: Left atrial size was normal in size. Right Atrium: Right atrial size was normal in size. Pericardium: There is no evidence of pericardial effusion. Mitral Valve: The mitral valve is normal in structure. Trivial mitral valve regurgitation. No evidence of mitral valve stenosis. Tricuspid Valve: The tricuspid valve is normal in structure. Tricuspid valve regurgitation is not demonstrated. No evidence of tricuspid stenosis. Aortic Valve: The aortic valve is tricuspid. There is mild calcification of the aortic valve. Aortic valve regurgitation is not visualized. Mild aortic valve sclerosis is present, with no evidence of aortic valve stenosis. Pulmonic Valve: The pulmonic valve was normal in structure. Pulmonic valve regurgitation is not visualized.  No evidence of pulmonic stenosis. Aorta: The aortic root is normal in size and structure. Venous: The inferior vena cava is normal in size with greater than 50% respiratory variability, suggesting right atrial pressure of 3 mmHg. IAS/Shunts: No atrial level shunt detected by color flow Doppler.  LEFT VENTRICLE PLAX 2D LVIDd:         3.50 cm  Diastology LVIDs:         2.30 cm  LV e' medial:    3.81 cm/s LV PW:         1.20 cm  LV E/e' medial:  15.1 LV IVS:        1.10 cm  LV e' lateral:   4.46 cm/s LVOT diam:     1.80 cm  LV E/e' lateral: 12.9 LV SV:         49 LV SV Index:   27 LVOT Area:     2.54 cm  RIGHT VENTRICLE             IVC RV Basal diam:  2.60 cm     IVC diam: 1.20 cm RV S prime:     12.90 cm/s  TAPSE (M-mode): 1.8 cm LEFT ATRIUM             Index       RIGHT ATRIUM          Index LA diam:        2.70 cm 1.46 cm/m  RA Area:     7.49 cm LA Vol (A2C):   34.0 ml 18.33 ml/m RA Volume:   12.20 ml 6.58 ml/m LA Vol (A4C):   21.4 ml 11.54 ml/m LA Biplane Vol: 27.1 ml 14.61 ml/m  AORTIC VALVE LVOT Vmax:   85.70 cm/s LVOT Vmean:  54.800 cm/s LVOT VTI:    0.194 m  AORTA Ao Root diam: 3.00 cm Ao Asc diam:  3.00 cm MITRAL VALVE MV Area (PHT): 2.91 cm    SHUNTS MV Decel Time: 261 msec    Systemic VTI:  0.19 m MV E velocity: 57.50 cm/s  Systemic Diam: 1.80 cm MV A velocity: 86.90 cm/s MV E/A ratio:  0.66 Jenkins Rouge MD Electronically signed by Jenkins Rouge MD Signature Date/Time: 05/04/2021/4:52:22 PM    Final    ECHOCARDIOGRAM LIMITED BUBBLE STUDY  Result Date: 05/05/2021    ECHOCARDIOGRAM LIMITED REPORT   Patient Name:   Caitlin Harrington Date of Exam: 05/05/2021 Medical Rec #:  [id number]       Height:       60.0 in Accession #:    [id number]      Weight:       197.0 lb Date of Birth:  27-Mar-1946       BSA:          1.855 m Patient Age:    42 years        BP:           148/71 mmHg Patient Gender: F               HR:           62 bpm. Exam Location:  Inpatient Procedure: Saline Contrast Bubble Study and Limited  Echo Indications:    Stroke  History:        Patient has prior history of Echocardiogram examinations, most                 recent 05/04/2021. Stroke, Signs/Symptoms:Altered Mental Status;                 Risk Factors:Hypertension and Diabetes.  Sonographer:    Roseanna Rainbow RDCS Referring Phys: [id number] [facility]  Sonographer Comments: Technically difficult study due to poor echo windows and patient is morbidly obese. Image acquisition challenging due to patient body habitus. Limited bubble study for stroke. IMPRESSIONS  1. Limited study to evaluate for intracardiac shunt.  2. Agitated saline contrast bubble study was negative, with no evidence of any interatrial shunt. Technically difficult study, but no shunting seen on bubble study FINDINGS  Left Ventricle: Left ventricular ejection fraction, by estimation, is 60 to 65%. The left ventricle has normal function. Right Ventricle: Right ventricular systolic function is normal. IAS/Shunts: Agitated saline contrast was given intravenously to evaluate for intracardiac shunting. Agitated saline contrast bubble study was negative, with no evidence of any interatrial shunt. Oswaldo Milian MD Electronically signed by Oswaldo Milian MD Signature Date/Time: 05/05/2021/1:41:16 PM    Final    ECHO TEE  Result Date: 05/06/2021    TRANSESOPHOGEAL ECHO REPORT   Patient Name:   ZAIDY ABSHER Date of Exam: 05/06/2021 Medical Rec #:  [id number]  Height:       60.0 in Accession #:    [id number]      Weight:       197.0 lb Date of Birth:  06-03-1946       BSA:          1.855 m Patient Age:    43 years        BP:           174/88 mmHg Patient Gender: F               HR:           71 bpm. Exam Location:  Inpatient Procedure: Transesophageal Echo, Color Doppler and Saline Contrast Bubble Study Indications:     Stroke I63.9  History:         Patient has prior history of Echocardiogram examinations, most                  recent 05/05/2021. Stroke; Risk  Factors:Hypertension and                  Diabetes. COVID-19.  Sonographer:     Tiffany Dance Referring Phys:  [id number] Tami Lin [facility] Diagnosing Phys: Mertie Moores MD PROCEDURE: The transesophogeal probe was passed without difficulty through the esophogus of the patient. Sedation performed by different physician. The patient was monitored while under deep sedation. Anesthestetic sedation was provided intravenously by Anesthesiology: 131.67m of Propofol, 664mof Lidocaine. The patient developed no complications during the procedure. IMPRESSIONS  1. Left ventricular ejection fraction, by estimation, is 60 to 65%. The left ventricle has normal function. The left ventricle has no regional wall motion abnormalities.  2. Right ventricular systolic function is normal. The right ventricular size is normal.  3. No left atrial/left atrial appendage thrombus was detected.  4. The mitral valve is grossly normal. Mild mitral valve regurgitation.  5. The aortic valve is normal in structure. Aortic valve regurgitation is trivial. FINDINGS  Left Ventricle: Left ventricular ejection fraction, by estimation, is 60 to 65%. The left ventricle has normal function. The left ventricle has no regional wall motion abnormalities. The left ventricular internal cavity size was normal in size. Right Ventricle: The right ventricular size is normal. Right vetricular wall thickness was not well visualized. Right ventricular systolic function is normal. Left Atrium: Left atrial size was normal in size. No left atrial/left atrial appendage thrombus was detected. Right Atrium: Right atrial size was normal in size. Pericardium: There is no evidence of pericardial effusion. Mitral Valve: The mitral valve is grossly normal. Mild mitral valve regurgitation. Tricuspid Valve: The tricuspid valve is normal in structure. Tricuspid valve regurgitation is mild. Aortic Valve: The aortic valve is normal in structure. Aortic valve regurgitation is trivial.  Pulmonic Valve: The pulmonic valve was not well visualized. Pulmonic valve regurgitation is not visualized. Aorta: The aortic root and ascending aorta are structurally normal, with no evidence of dilitation. There is minimal (Grade I) plaque. IAS/Shunts: No atrial level shunt detected by color flow Doppler. Agitated saline contrast was given intravenously to evaluate for intracardiac shunting. PhMertie MooresD Electronically signed by PhMertie MooresD Signature Date/Time: 05/06/2021/4:16:32 PM    Final         Discharge Exam: Vitals:   05/27/21 0510 05/27/21 1244  BP: (!) 146/61 129/66  Pulse: 66 62  Resp: 18 16  Temp: 97.7 F (36.5 C) (!) 97.4 F (36.3 C)  SpO2: 95% 96%   Vitals:   05/26/21 1700 05/26/21  2020 05/27/21 0510 05/27/21 1244  BP: (!) 189/91 122/88 (!) 146/61 129/66  Pulse: 67 68 66 62  Resp: 18 19 18 16   Temp:  98 F (36.7 C) 97.7 F (36.5 C) (!) 97.4 F (36.3 C)  TempSrc:   Oral Oral  SpO2:  100% 95% 96%  Weight:      Height:        General: Pt is alert, awake, not in acute distress Cardiovascular: RRR, S1/S2 +, no rubs, no gallops Respiratory: CTA bilaterally, no wheezing, no rhonchi Abdominal: Soft, NT, ND, bowel sounds + Extremities: no edema, no cyanosis   The results of significant diagnostics from this hospitalization (including imaging, microbiology, ancillary and laboratory) are listed below for reference.    Significant Diagnostic Studies: CT Head Wo Contrast  Result Date: 05/25/2021 CLINICAL DATA:  Altered mental status.  Hypoglycemia. EXAM: CT HEAD WITHOUT CONTRAST TECHNIQUE: Contiguous axial images were obtained from the base of the skull through the vertex without intravenous contrast. COMPARISON:  Head CT and brain MRI 05/04/2021 FINDINGS: Brain: No hemorrhage. Expected evolution of right thalamic infarct. Additional small infarcts that were acute on prior MRI have no definite CT correlate. Background atrophy and chronic small vessel ischemia.  Stable ventricular size and appearance with slight enlargement of right lateral ventricle compared to left. There are remote lacunar infarcts in the left basal ganglia. Minimal encephalomalacia in the left parietooccipital lobe is unchanged. No subdural or extra-axial collection. Vascular: Prominent skull base atherosclerosis. No hyperdense vessel or unexpected calcification. Skull: No fracture or focal lesion. Sinuses/Orbits: Paranasal sinuses and mastoid air cells are clear. The visualized orbits are unremarkable. Other: None. IMPRESSION: 1. No acute intracranial abnormality. 2. Expected evolution of right thalamic infarct from prior exam. Additional small infarcts that were acute on prior MRI have no definite CT correlate. 3. Background atrophy and chronic small vessel ischemia. Electronically Signed   By: Keith Rake M.D.   On: 05/25/2021 16:29   CT HEAD WO CONTRAST  Result Date: 05/04/2021 CLINICAL DATA:  Mental status change.  Possible stroke. EXAM: CT HEAD WITHOUT CONTRAST TECHNIQUE: Contiguous axial images were obtained from the base of the skull through the vertex without intravenous contrast. COMPARISON:  05/04/2021 at 3:23 a.m., 05/03/2021 as well as MRI 05/03/2021 FINDINGS: Brain: Ventricles, cisterns and other CSF spaces are unchanged. Evidence of patient's known acute right lamina infarct unchanged. Other too small bilateral acute infarcts seen on recent MRI not visualized by CT. Moderate chronic ischemic microvascular disease. Old lacunar infarct over the right lentiform nucleus. No mass, mass effect or shift of midline structures. No acute hemorrhage. Vascular: No hyperdense vessel or unexpected calcification. Skull: Normal. Negative for fracture or focal lesion. Sinuses/Orbits: No acute finding. Other: None. IMPRESSION: 1. Evidence of patient's known acute right thalamus infarct unchanged. Other two small bilateral acute infarcts seen on recent MRI are not visualized by CT. No acute  hemorrhage. 2. Moderate chronic ischemic microvascular disease. Old right basal ganglia lacunar infarct. Electronically Signed   By: Marin Olp M.D.   On: 05/04/2021 11:46   CT HEAD WO CONTRAST  Result Date: 05/04/2021 CLINICAL DATA:  Altered mental status EXAM: CT HEAD WITHOUT CONTRAST TECHNIQUE: Contiguous axial images were obtained from the base of the skull through the vertex without intravenous contrast. COMPARISON:  05/03/2021 FINDINGS: Brain: The previously seen acute acute right thalamic infarct on MRI is again noted. The previously seen smaller bifrontal infarcts not definitively seen by CT. No hemorrhage. No new areas of infarction. Old right  basal ganglia lacunar infarct. Chronic small vessel disease throughout the deep white matter. Vascular: No hyperdense vessel or unexpected calcification. Skull: No acute calvarial abnormality. Sinuses/Orbits: No acute findings Other: None IMPRESSION: Acute right thalamic infarct again seen as noted on prior MRI. Other smaller frontal infarcts not visualized by CT. No hemorrhage. Electronically Signed   By: Rolm Baptise M.D.   On: 05/04/2021 03:39   CT HEAD WO CONTRAST  Result Date: 05/03/2021 CLINICAL DATA:  Headache and generalized weakness since last evening. EXAM: CT HEAD WITHOUT CONTRAST TECHNIQUE: Contiguous axial images were obtained from the base of the skull through the vertex without intravenous contrast. COMPARISON:  Head CT 10/26/2020 FINDINGS: Brain: Stable age advanced cerebral atrophy, ventriculomegaly and periventricular white matter disease. Stable remote right basal ganglia infarct with encephalomalacia and ex vacuo dilatation of the frontal horn of the right lateral ventricle. No extra-axial fluid collections are identified. No CT findings for acute hemispheric infarction or intracranial hemorrhage. No mass lesions. The brainstem and cerebellum are normal. Vascular: Vascular calcifications but no aneurysm or hyperdense vessels. Skull: No  skull fracture or bone lesions. Sinuses/Orbits: The paranasal sinuses and mastoid air cells are clear. The globes are intact. Other: No scalp lesions or scalp hematoma. IMPRESSION: 1. Stable age advanced cerebral atrophy, ventriculomegaly and periventricular white matter disease. 2. Stable remote right basal ganglia infarct. 3. No acute intracranial findings or mass lesions. Electronically Signed   By: Marijo Sanes M.D.   On: 05/03/2021 18:20   MR ANGIO HEAD WO CONTRAST  Result Date: 05/04/2021 CLINICAL DATA:  Acute strokes on MRI brain EXAM: MRA HEAD WITHOUT CONTRAST MRA NECK WITHOUT CONTRAST TECHNIQUE: Angiographic images of the Circle of Willis were obtained using MRA technique without intravenous contrast. Angiographic images of the neck were obtained using MRA technique without intravenous contrast. Carotid stenosis measurements (when applicable) are obtained utilizing NASCET criteria, using the distal internal carotid diameter as the denominator. COMPARISON:  Correlation made with prior MRI brain 05/03/2021, CTA neck 10/29/2020 FINDINGS: MRA HEAD Motion artifact is present. Intracranial internal carotid arteries are patent with atherosclerotic irregularity. Anterior cerebral arteries are patent. Right A1 ACA is no longer identified launch could be related artifact in the area or new stenosis. Anterior communicating artery is present. Middle cerebral arteries are patent with multifocal atherosclerotic irregularity. Intracranial vertebral arteries, basilar artery, posterior cerebral arteries are patent. Atherosclerotic irregularity of the posterior cerebral arteries. No aneurysm identified. MRA NECK Common, internal, and external carotid arteries are patent. There is no hemodynamically significant stenosis at the ICA origins. Retropharyngeal course. No evidence of dissection. Extracranial vertebral arteries are patent. Origins are not well evaluated due to artifact. No evidence of dissection. IMPRESSION:  Suboptimal evaluation due to motion artifact. No large vessel occlusion. No hemodynamically significant stenosis in the neck. Multifocal intracranial atherosclerosis also seen on 2021 CTA. There is no flow seen within the right A1 ACA, which could be related to artifact in the area or new stenosis. Electronically Signed   By: Macy Mis M.D.   On: 05/04/2021 14:44   MR ANGIO NECK WO CONTRAST  Result Date: 05/04/2021 CLINICAL DATA:  Acute strokes on MRI brain EXAM: MRA HEAD WITHOUT CONTRAST MRA NECK WITHOUT CONTRAST TECHNIQUE: Angiographic images of the Circle of Willis were obtained using MRA technique without intravenous contrast. Angiographic images of the neck were obtained using MRA technique without intravenous contrast. Carotid stenosis measurements (when applicable) are obtained utilizing NASCET criteria, using the distal internal carotid diameter as the denominator. COMPARISON:  Correlation  made with prior MRI brain 05/03/2021, CTA neck 10/29/2020 FINDINGS: MRA HEAD Motion artifact is present. Intracranial internal carotid arteries are patent with atherosclerotic irregularity. Anterior cerebral arteries are patent. Right A1 ACA is no longer identified launch could be related artifact in the area or new stenosis. Anterior communicating artery is present. Middle cerebral arteries are patent with multifocal atherosclerotic irregularity. Intracranial vertebral arteries, basilar artery, posterior cerebral arteries are patent. Atherosclerotic irregularity of the posterior cerebral arteries. No aneurysm identified. MRA NECK Common, internal, and external carotid arteries are patent. There is no hemodynamically significant stenosis at the ICA origins. Retropharyngeal course. No evidence of dissection. Extracranial vertebral arteries are patent. Origins are not well evaluated due to artifact. No evidence of dissection. IMPRESSION: Suboptimal evaluation due to motion artifact. No large vessel occlusion. No  hemodynamically significant stenosis in the neck. Multifocal intracranial atherosclerosis also seen on 2021 CTA. There is no flow seen within the right A1 ACA, which could be related to artifact in the area or new stenosis. Electronically Signed   By: Macy Mis M.D.   On: 05/04/2021 14:44   MR Brain Wo Contrast (neuro protocol)  Result Date: 05/04/2021 CLINICAL DATA:  Initial evaluation for neuro deficit, stroke suspected. EXAM: MRI HEAD WITHOUT CONTRAST TECHNIQUE: Multiplanar, multiecho pulse sequences of the brain and surrounding structures were obtained without intravenous contrast. COMPARISON:  Prior CT from earlier the same day. FINDINGS: Brain: Diffuse prominence of the CSF containing spaces compatible generalized age-related cerebral atrophy. Patchy and confluent T2/FLAIR hyperintensity within the periventricular and deep white matter both cerebral hemispheres as well as the pons, most consistent with chronic small vessel ischemic disease, fairly advanced in nature. Few scatter remote lacunar infarcts present about the bilateral basal ganglia with associated chronic hemosiderin staining. Small remote cortical/subcortical infarct at the left occipital lobe. 1.2 cm acute ischemic infarct present at the right ventral medial thalamus. Additional 8 mm acute ischemic cortical/subcortical infarct at the parasagittal posterior right frontal lobe (series 5, image 90). Additional acute to early subacute infarct measuring 1.3 cm present at the left frontal corona radiata (series 5, image 82). No associated hemorrhage or mass effect about these areas of ischemia. Otherwise, gray-white matter differentiation maintained. No other areas of chronic cortical infarction. No acute intracranial hemorrhage. Few additional scattered chronic micro hemorrhages noted, likely small vessel and/or hypertensive in nature. No mass lesion, midline shift or mass effect. Mild ex vacuo dilatation of the right lateral ventricle  related to a chronic right basal ganglia infarct. No hydrocephalus. No extra-axial fluid collection. Pituitary gland suprasellar region normal. Midline structures intact. Vascular: Major intracranial vascular flow voids are maintained. Skull and upper cervical spine: Craniocervical junction within normal limits. Bone marrow signal intensity normal. No focal marrow replacing lesion. No scalp soft tissue abnormality. Sinuses/Orbits: Patient status post bilateral ocular lens replacement. Globes and orbital soft tissues demonstrate no acute finding. Paranasal sinuses are clear. No mastoid effusion. Inner ear structures grossly normal. Other: None. IMPRESSION: 1. 1.2 cm acute ischemic nonhemorrhagic right thalamic infarct. 2. Additional 8 mm acute ischemic nonhemorrhagic cortical/subcortical infarct involving the parasagittal posterior right frontal lobe. 3. Additional 1.3 cm acute to early subacute ischemic nonhemorrhagic left frontal corona radiata infarct. 4. Underlying age-related cerebral atrophy with advanced chronic small vessel ischemic disease, with a few additional scattered remote lacunar infarcts as above. Electronically Signed   By: Jeannine Boga M.D.   On: 05/04/2021 00:46   DG Chest Port 1 View  Result Date: 05/25/2021 CLINICAL DATA:  Weakness.  Lethargy.  EXAM: PORTABLE CHEST 1 VIEW COMPARISON:  10/26/2020 FINDINGS: The heart size and mediastinal contours are within normal limits. Tortuosity of thoracic aorta again noted. Both lungs are clear. The visualized skeletal structures are unremarkable. IMPRESSION: No active disease. Electronically Signed   By: Marlaine Hind M.D.   On: 05/25/2021 16:32   ECHOCARDIOGRAM COMPLETE  Result Date: 05/04/2021    ECHOCARDIOGRAM REPORT   Patient Name:   Caitlin Harrington Date of Exam: 05/04/2021 Medical Rec #:  [id number]       Height:       60.0 in Accession #:    [id number]      Weight:       197.0 lb Date of Birth:  08-17-1946       BSA:          1.855 m  Patient Age:    83 years        BP:           157/81 mmHg Patient Gender: F               HR:           62 bpm. Exam Location:  Inpatient Procedure: 2D Echo, Color Doppler and Cardiac Doppler Indications:    Stroke I63.9  History:        Patient has prior history of Echocardiogram examinations, most                 recent 10/28/2020. Risk Factors:Hypertension and Diabetes.                 COVID-19.  Sonographer:    Tiffany Dance Referring Phys: [id number] AS[facility] [place]  1. Left ventricular ejection fraction, by estimation, is 60 to 65%. The left ventricle has normal function. The left ventricle has no regional wall motion abnormalities. Left ventricular diastolic parameters were normal.  2. Right ventricular systolic function is normal. The right ventricular size is normal.  3. The mitral valve is normal in structure. Trivial mitral valve regurgitation. No evidence of mitral stenosis.  4. The aortic valve is tricuspid. There is mild calcification of the aortic valve. Aortic valve regurgitation is not visualized. Mild aortic valve sclerosis is present, with no evidence of aortic valve stenosis.  5. The inferior vena cava is normal in size with greater than 50% respiratory variability, suggesting right atrial pressure of 3 mmHg. FINDINGS  Left Ventricle: Left ventricular ejection fraction, by estimation, is 60 to 65%. The left ventricle has normal function. The left ventricle has no regional wall motion abnormalities. The left ventricular internal cavity size was normal in size. There is  no left ventricular hypertrophy. Left ventricular diastolic parameters were normal. Right Ventricle: The right ventricular size is normal. No increase in right ventricular wall thickness. Right ventricular systolic function is normal. Left Atrium: Left atrial size was normal in size. Right Atrium: Right atrial size was normal in size. Pericardium: There is no evidence of pericardial effusion. Mitral Valve: The mitral  valve is normal in structure. Trivial mitral valve regurgitation. No evidence of mitral valve stenosis. Tricuspid Valve: The tricuspid valve is normal in structure. Tricuspid valve regurgitation is not demonstrated. No evidence of tricuspid stenosis. Aortic Valve: The aortic valve is tricuspid. There is mild calcification of the aortic valve. Aortic valve regurgitation is not visualized. Mild aortic valve sclerosis is present, with no evidence of aortic valve stenosis. Pulmonic Valve: The pulmonic valve was normal in structure. Pulmonic valve regurgitation is not visualized. No evidence  of pulmonic stenosis. Aorta: The aortic root is normal in size and structure. Venous: The inferior vena cava is normal in size with greater than 50% respiratory variability, suggesting right atrial pressure of 3 mmHg. IAS/Shunts: No atrial level shunt detected by color flow Doppler.  LEFT VENTRICLE PLAX 2D LVIDd:         3.50 cm  Diastology LVIDs:         2.30 cm  LV e' medial:    3.81 cm/s LV PW:         1.20 cm  LV E/e' medial:  15.1 LV IVS:        1.10 cm  LV e' lateral:   4.46 cm/s LVOT diam:     1.80 cm  LV E/e' lateral: 12.9 LV SV:         49 LV SV Index:   27 LVOT Area:     2.54 cm  RIGHT VENTRICLE             IVC RV Basal diam:  2.60 cm     IVC diam: 1.20 cm RV S prime:     12.90 cm/s TAPSE (M-mode): 1.8 cm LEFT ATRIUM             Index       RIGHT ATRIUM          Index LA diam:        2.70 cm 1.46 cm/m  RA Area:     7.49 cm LA Vol (A2C):   34.0 ml 18.33 ml/m RA Volume:   12.20 ml 6.58 ml/m LA Vol (A4C):   21.4 ml 11.54 ml/m LA Biplane Vol: 27.1 ml 14.61 ml/m  AORTIC VALVE LVOT Vmax:   85.70 cm/s LVOT Vmean:  54.800 cm/s LVOT VTI:    0.194 m  AORTA Ao Root diam: 3.00 cm Ao Asc diam:  3.00 cm MITRAL VALVE MV Area (PHT): 2.91 cm    SHUNTS MV Decel Time: 261 msec    Systemic VTI:  0.19 m MV E velocity: 57.50 cm/s  Systemic Diam: 1.80 cm MV A velocity: 86.90 cm/s MV E/A ratio:  0.66 Jenkins Rouge MD Electronically signed  by Jenkins Rouge MD Signature Date/Time: 05/04/2021/4:52:22 PM    Final    ECHOCARDIOGRAM LIMITED BUBBLE STUDY  Result Date: 05/05/2021    ECHOCARDIOGRAM LIMITED REPORT   Patient Name:   Caitlin Harrington Date of Exam: 05/05/2021 Medical Rec #:  [id number]       Height:       60.0 in Accession #:    [id number]      Weight:       197.0 lb Date of Birth:  May 27, 1946       BSA:          1.855 m Patient Age:    58 years        BP:           148/71 mmHg Patient Gender: F               HR:           62 bpm. Exam Location:  Inpatient Procedure: Saline Contrast Bubble Study and Limited Echo Indications:    Stroke  History:        Patient has prior history of Echocardiogram examinations, most                 recent 05/04/2021. Stroke, Signs/Symptoms:Altered Mental Status;  Risk Factors:Hypertension and Diabetes.  Sonographer:    Roseanna Rainbow RDCS Referring Phys: [id number] [facility]  Sonographer Comments: Technically difficult study due to poor echo windows and patient is morbidly obese. Image acquisition challenging due to patient body habitus. Limited bubble study for stroke. IMPRESSIONS  1. Limited study to evaluate for intracardiac shunt.  2. Agitated saline contrast bubble study was negative, with no evidence of any interatrial shunt. Technically difficult study, but no shunting seen on bubble study FINDINGS  Left Ventricle: Left ventricular ejection fraction, by estimation, is 60 to 65%. The left ventricle has normal function. Right Ventricle: Right ventricular systolic function is normal. IAS/Shunts: Agitated saline contrast was given intravenously to evaluate for intracardiac shunting. Agitated saline contrast bubble study was negative, with no evidence of any interatrial shunt. Oswaldo Milian MD Electronically signed by Oswaldo Milian MD Signature Date/Time: 05/05/2021/1:41:16 PM    Final    ECHO TEE  Result Date: 05/06/2021    TRANSESOPHOGEAL ECHO REPORT   Patient Name:   Caitlin MCEACHRON  Harrington Date of Exam: 05/06/2021 Medical Rec #:  [id number]       Height:       60.0 in Accession #:    [id number]      Weight:       197.0 lb Date of Birth:  03-23-1946       BSA:          1.855 m Patient Age:    74 years        BP:           174/88 mmHg Patient Gender: F               HR:           71 bpm. Exam Location:  Inpatient Procedure: Transesophageal Echo, Color Doppler and Saline Contrast Bubble Study Indications:     Stroke I63.9  History:         Patient has prior history of Echocardiogram examinations, most                  recent 05/05/2021. Stroke; Risk Factors:Hypertension and                  Diabetes. COVID-19.  Sonographer:     Tiffany Dance Referring Phys:  [id number] Tami Lin [facility] Diagnosing Phys: Mertie Moores MD PROCEDURE: The transesophogeal probe was passed without difficulty through the esophogus of the patient. Sedation performed by different physician. The patient was monitored while under deep sedation. Anesthestetic sedation was provided intravenously by Anesthesiology: 131.26m of Propofol, 610mof Lidocaine. The patient developed no complications during the procedure. IMPRESSIONS  1. Left ventricular ejection fraction, by estimation, is 60 to 65%. The left ventricle has normal function. The left ventricle has no regional wall motion abnormalities.  2. Right ventricular systolic function is normal. The right ventricular size is normal.  3. No left atrial/left atrial appendage thrombus was detected.  4. The mitral valve is grossly normal. Mild mitral valve regurgitation.  5. The aortic valve is normal in structure. Aortic valve regurgitation is trivial. FINDINGS  Left Ventricle: Left ventricular ejection fraction, by estimation, is 60 to 65%. The left ventricle has normal function. The left ventricle has no regional wall motion abnormalities. The left ventricular internal cavity size was normal in size. Right Ventricle: The right ventricular size is normal. Right vetricular wall  thickness was not well visualized. Right ventricular systolic function is normal. Left Atrium: Left atrial size was normal in size.  No left atrial/left atrial appendage thrombus was detected. Right Atrium: Right atrial size was normal in size. Pericardium: There is no evidence of pericardial effusion. Mitral Valve: The mitral valve is grossly normal. Mild mitral valve regurgitation. Tricuspid Valve: The tricuspid valve is normal in structure. Tricuspid valve regurgitation is mild. Aortic Valve: The aortic valve is normal in structure. Aortic valve regurgitation is trivial. Pulmonic Valve: The pulmonic valve was not well visualized. Pulmonic valve regurgitation is not visualized. Aorta: The aortic root and ascending aorta are structurally normal, with no evidence of dilitation. There is minimal (Grade I) plaque. IAS/Shunts: No atrial level shunt detected by color flow Doppler. Agitated saline contrast was given intravenously to evaluate for intracardiac shunting. Mertie Moores MD Electronically signed by Mertie Moores MD Signature Date/Time: 05/06/2021/4:16:32 PM    Final     Microbiology: Recent Results (from the past 240 hour(s))  Resp Panel by RT-PCR (Flu A&B, Covid) Nasopharyngeal Swab     Status: None   Collection Time: 05/25/21  3:58 PM   Specimen: Nasopharyngeal Swab; Nasopharyngeal(NP) swabs in vial transport medium  Result Value Ref Range Status   SARS Coronavirus 2 by RT PCR NEGATIVE NEGATIVE Final    Comment: (NOTE) SARS-CoV-2 target nucleic acids are NOT DETECTED.  The SARS-CoV-2 RNA is generally detectable in upper respiratory specimens during the acute phase of infection. The lowest concentration of SARS-CoV-2 viral copies this assay can detect is 138 copies/mL. A negative result does not preclude SARS-Cov-2 infection and should not be used as the sole basis for treatment or other patient management decisions. A negative result may occur with  improper specimen collection/handling,  submission of specimen other than nasopharyngeal swab, presence of viral mutation(s) within the areas targeted by this assay, and inadequate number of viral copies(<138 copies/mL). A negative result must be combined with clinical observations, patient history, and epidemiological information. The expected result is Negative.  Fact Sheet for Patients:  EntrepreneurPulse.com.au  Fact Sheet for Healthcare Providers:  IncredibleEmployment.be  This test is no t yet approved or cleared by the [place] FDA and  has been authorized for detection and/or diagnosis of SARS-CoV-2 by FDA under an Emergency Use Authorization (EUA). This EUA will remain  in effect (meaning this test can be used) for the duration of the COVID-19 declaration under Section 564(b)(1) of the Act, 21 U.S.C.section 360bbb-3(b)(1), unless the authorization is terminated  or revoked sooner.       Influenza A by PCR NEGATIVE NEGATIVE Final   Influenza B by PCR NEGATIVE NEGATIVE Final    Comment: (NOTE) The Xpert Xpress SARS-CoV-2/FLU/RSV plus assay is intended as an aid in the diagnosis of influenza from Nasopharyngeal swab specimens and should not be used as a sole basis for treatment. Nasal washings and aspirates are unacceptable for Xpert Xpress SARS-CoV-2/FLU/RSV testing.  Fact Sheet for Patients: EntrepreneurPulse.com.au  Fact Sheet for Healthcare Providers: IncredibleEmployment.be  This test is not yet approved or cleared by the [place] FDA and has been authorized for detection and/or diagnosis of SARS-CoV-2 by FDA under an Emergency Use Authorization (EUA). This EUA will remain in effect (meaning this test can be used) for the duration of the COVID-19 declaration under Section 564(b)(1) of the Act, 21 U.S.C. section 360bbb-3(b)(1), unless the authorization is terminated or revoked.  Performed at [facility], [address], [place], [place] [address]   Urine culture     Status: Abnormal (Preliminary result)   Collection Time: 05/25/21  5:13 PM   Specimen: Urine, Clean  Catch  Result Value Ref Range Status   Specimen Description   Final    URINE, CLEAN CATCH Performed at [facility], [address] [place], [place] [address]    Special Requests   Final    NONE Performed at [facility], [address], [place], [place] [address]    Culture (A)  Final    >=100,000 COLONIES/mL GRAM NEGATIVE RODS >=100,000 COLONIES/mL LACTOBACILLUS SPECIES    Report Status PENDING  Incomplete     Labs: Basic Metabolic Panel: Recent Labs  Lab 05/25/21 1604 05/26/21 0635 05/27/21 0504  NA 136 138 140  K 3.9 3.1* 3.4*  CL 102 105 106  CO2 25 25 26   GLUCOSE 73 101* 98  BUN 36* 30* 23  CREATININE 1.58* 1.32* 0.94  CALCIUM 9.4 8.9 8.6*  MG  --   --  1.5*   Liver Function Tests: Recent Labs  Lab 05/25/21 1604  AST 25  ALT 18  ALKPHOS 79  BILITOT 0.4  PROT 6.9  ALBUMIN 3.4*   No results for input(s): LIPASE, AMYLASE in the last 168 hours. Recent Labs  Lab 05/25/21 1604  AMMONIA 34   CBC: Recent Labs  Lab 05/25/21 1604 05/26/21 0635 05/27/21 0504  WBC 8.8 8.8 8.1  NEUTROABS 5.7  --   --   HGB 12.8 12.6 12.2  HCT 38.3 39.1 36.4  MCV 87.2 88.3 86.9  PLT 237 253 233   Cardiac Enzymes: No results for input(s): CKTOTAL, CKMB, CKMBINDEX, TROPONINI in the last 168 hours. BNP: Invalid input(s): POCBNP CBG: Recent Labs  Lab 05/26/21 1108 05/26/21 1645 05/26/21 2019 05/27/21 0754 05/27/21 1127  GLUCAP 169* 122* 121* 94 233*    Time coordinating discharge:  36 minutes  Signed:  [name], DO [facility] Pager: [phone] 05/27/2021, 2:57 PM

## 2021-05-27 NOTE — TOC Initial Note (Signed)
Transition of Care [facility]) - Initial/Assessment Note    Patient Details  Name: Caitlin Harrington MRN: [id number] Date of Birth: 1946/01/16  Transition of Care [facility]) CM/SW Contact:    Leitha Bleak, RN Phone Number: 05/27/2021, 3:40 PM  Clinical Narrative:      Patient admitted with acute metabolic encephalopathy.  PT is recommending HHPT.  TOC called the room, Roe Coombs - significant other is here to take patient back to his home at [facility] [place], [place]. He states this is temporary. He is not sure if she will go back to her sons house. She has not been taking her medication. She will need assistance in planning. [facility].               Expected Discharge Plan: Home w Home Health Services Barriers to Discharge: Barriers Resolved   Patient Goals and CMS Choice Patient states their goals for this hospitalization and ongoing recovery are:: to go home. CMS Medicare.gov Compare Post Acute Care list provided to:: Patient    Expected Discharge Plan and Services Expected Discharge Plan: Home w Home Health Services       Living arrangements for the past 2 months: Single Family Home Expected Discharge Date: 05/27/21                                    Prior Living Arrangements/Services Living arrangements for the past 2 months: Single Family Home Lives with:: Adult Children Patient language and need for interpreter reviewed:: Yes        Need for Family Participation in Patient Care: Yes (Comment)     Criminal Activity/Legal Involvement Pertinent to Current Situation/Hospitalization: No - Comment as needed  Activities of Daily Living Home Assistive Devices/Equipment: CBG Meter ADL Screening (condition at time of admission) Patient's cognitive ability adequate to safely complete daily activities?: Yes Is the patient deaf or have difficulty hearing?: No Does the patient have difficulty seeing, even when wearing glasses/contacts?: No Does  the patient have difficulty concentrating, remembering, or making decisions?: No Patient able to express need for assistance with ADLs?: Yes Does the patient have difficulty dressing or bathing?: No Independently performs ADLs?: Yes (appropriate for developmental age) Does the patient have difficulty walking or climbing stairs?: No Weakness of Legs: None Weakness of Arms/Hands: None  Permission Sought/Granted                  Emotional Assessment     Affect (typically observed): Accepting Orientation: : Oriented to Self, Oriented to Place, Oriented to  Time, Oriented to Situation Alcohol / Substance Use: Not Applicable Psych Involvement: No (comment)  Admission diagnosis:  Unsteady gait [R26.81] Altered mental status, unspecified altered mental status type [R41.82] Acute metabolic encephalopathy [G93.41] Patient Active Problem List   Diagnosis Date Noted   CKD (chronic kidney disease) stage 3, GFR 30-59 ml/min (HCC) 05/26/2021   Uncontrolled type 2 diabetes mellitus with hyperglycemia, with long-term current use of insulin (HCC) 05/26/2021   Unsteady gait    CVA (cerebral vascular accident) (HCC) 05/04/2021   Acute CVA (cerebrovascular accident) (HCC) 05/04/2021   Pain in joint involving ankle and foot 11/06/2020   Acute renal insufficiency 11/06/2020   Allergic rhinitis 11/06/2020   Arthropathy 11/06/2020   Benign essential hypertension 11/06/2020   Bilateral carpal tunnel syndrome 11/06/2020   Chronic sinusitis 11/06/2020   Diverticulitis of colon 11/06/2020   Eczema  11/06/2020   Headache 11/06/2020   Long term (current) use of insulin (HCC) 11/06/2020   Acute metabolic encephalopathy 11/06/2020   Noncompliance with treatment 11/06/2020   Obstructive sleep apnea syndrome 11/06/2020   Other specified abnormal findings of blood chemistry 11/06/2020   Paresthesias 11/06/2020   Personal history of transient ischemic attack (TIA), and cerebral infarction without  residual deficits 11/06/2020   Rash 11/06/2020   Depression 11/06/2020   Skin sensation disturbance 11/06/2020   Subclinical hypothyroidism 11/06/2020   Tick bite 11/06/2020   Tiredness 11/06/2020   Unspecified abnormal finding in specimens from other organs, systems and tissues 11/06/2020   Cerebral thrombosis with cerebral infarction 10/29/2020   Major depressive disorder, recurrent episode, moderate (HCC) 10/27/2020   Allergic reaction 10/26/2020   AKI (acute kidney injury) (HCC) 10/26/2020   Thrush 10/26/2020   Urinary tract infection without hematuria 05/12/2020   Encephalopathy 05/12/2020   Altered mental status    Hyperglycemia    Acute on chronic respiratory failure with hypoxia (HCC) 01/03/2020   COVID-19 12/30/2019   Hyperuricemia    Hypertension 02/18/2011   Prolonged depressive adjustment reaction 02/18/2011   Obesity 02/18/2011   Asthma, mild persistent 02/18/2011   Vitamin D deficiency 02/18/2011   Hyperlipidemia, unspecified 02/18/2011   Diabetes mellitus type 2, controlled, without complications (HCC) 02/18/2011   Insomnia 02/18/2011   Stress at work 02/18/2011   PCP:  Ileana Ladd, MD Pharmacy:   [facility] [phone] - [place],  - [address] AT [facility] [address] [place] [address] Phone: [phone] Fax: [phone]     Social Determinants of Health (SDOH) Interventions    Readmission Risk Interventions Readmission Risk Prevention Plan 05/27/2021  Transportation Screening Complete  Home Care Screening Complete  Medication Review (RN CM) Complete  Some recent data might be hidden

## 2021-05-27 NOTE — Evaluation (Addendum)
Physical Therapy Evaluation Patient Details Name: Caitlin Harrington MRN: [id number] DOB: 1946-06-14 Today's Date: 05/27/2021   History of Present Illness  Caitlin Harrington is a 75 y.o. female with a history of Diabetes, depression, hypertension, Obesity, and recent CVA.  Patient brought in by EMS for confusion that is been going on all day.  Her blood sugar was low, so she was given Lantus and candy.  On EMS arrival, her CBG was 68.  She was given oral glucose.  On recheck her CBG was 165 and was down to 106 on arrival to the emergency department.  Patient feels confused and unable to report any palliating or provoking factors.  She denies hallucinations, unilateral weakness,, chills, nausea, vomiting.  She denies headache.  Per EDP, her symptoms appear somewhat improved than when she first arrived.   Clinical Impression  Patient presented in bed asleep easily aroused and agreeable to therapy. Patient demonstrated fair bed mobility with modified independence and was able to sit up at EOB and put her socks on without out loss of balance. Patient was able to preform a sit to stand with 2 attempts without use of AD. Patient reported initial dizziness and use the IV pole for first feet steps then was able to ambulate with supervision for 100 feet. Patient demonstrated fair stair negotiation for 4 steps with a noted slight unsteadiness on descending the steps without loss of balance. Patient return to room and sat in chair for lunch - nursing staff notified. Patient will benefit from continued physical therapy in hospital and recommended venue below to increase strength, balance, endurance for safe ADLs and gait.     Follow Up Recommendations Home health PT    Equipment Recommendations  None recommended by PT    Recommendations for Other Services       Precautions / Restrictions Precautions Precautions: Fall Restrictions Weight Bearing Restrictions: No      Mobility  Bed Mobility Overal bed  mobility: Modified Independent               Patient Response: Flat affect;Cooperative  Transfers Overall transfer level: Modified independent Equipment used: None                Ambulation/Gait Ambulation/Gait assistance: Supervision Gait Distance (Feet): 100 Feet Assistive device: None Gait Pattern/deviations: Decreased step length - right;Decreased step length - left;Decreased stance time - right Gait velocity: decreased   General Gait Details: slow cautious steps, noted to be constantly looking around and not paying attention to where she is walking  Stairs Stairs: Yes Stairs assistance: Supervision Stair Management: One rail Right;Step to pattern Number of Stairs: 4 General stair comments: slow cautious steps, noted slight unsteadiness when decending but no LOB  Wheelchair Mobility    Modified Rankin (Stroke Patients Only)       Balance Overall balance assessment: Modified Independent                                           Pertinent Vitals/Pain Pain Assessment: No/denies pain    Home Living Family/patient expects to be discharged to:: Private residence Living Arrangements: Alone Available Help at Discharge: Family;Available PRN/intermittently Type of Home: House Home Access: Stairs to enter Entrance Stairs-Rails: Right;Left;Can reach both Entrance Stairs-Number of Steps: 5-6 Home Layout: One level Home Equipment: None Additional Comments: house hold ambulator    Prior Function Level of Independence: Independent  Comments: Pt independent with ADLs, IADLs, and mobility.     Hand Dominance   Dominant Hand: Right    Extremity/Trunk Assessment   Upper Extremity Assessment Upper Extremity Assessment: Overall WFL for tasks assessed    Lower Extremity Assessment Lower Extremity Assessment: Overall WFL for tasks assessed    Cervical / Trunk Assessment Cervical / Trunk Assessment: Normal  Communication    Communication: No difficulties  Cognition Arousal/Alertness: Awake/alert Behavior During Therapy: Flat affect;WFL for tasks assessed/performed Overall Cognitive Status: Within Functional Limits for tasks assessed                                        General Comments      Exercises     Assessment/Plan    PT Assessment Patient needs continued PT services  PT Problem List Decreased strength;Decreased activity tolerance;Decreased balance;Decreased mobility;Decreased coordination;Decreased safety awareness       PT Treatment Interventions DME instruction;Gait training;Stair training;Functional mobility training;Therapeutic activities;Therapeutic exercise;Balance training;Patient/family education    PT Goals (Current goals can be found in the Care Plan section)  Acute Rehab PT Goals Patient Stated Goal: Go home PT Goal Formulation: With patient Time For Goal Achievement: 06/10/21 Potential to Achieve Goals: Good    Frequency Min 2X/week   Barriers to discharge        Co-evaluation               AM-PAC PT "6 Clicks" Mobility  Outcome Measure Help needed turning from your back to your side while in a flat bed without using bedrails?: None Help needed moving from lying on your back to sitting on the side of a flat bed without using bedrails?: None Help needed moving to and from a bed to a chair (including a wheelchair)?: A Little Help needed standing up from a chair using your arms (e.g., wheelchair or bedside chair)?: A Little Help needed to walk in hospital room?: A Little Help needed climbing 3-5 steps with a railing? : A Lot 6 Click Score: 19    End of Session   Activity Tolerance: Patient tolerated treatment well Patient left: in chair;with call bell/phone within reach;with chair alarm set Nurse Communication: Mobility status PT Visit Diagnosis: Difficulty in walking, not elsewhere classified (R26.2);Unsteadiness on feet (R26.81);Muscle weakness  (generalized) (M62.81)    Time: 9563-8756 PT Time Calculation (min) (ACUTE ONLY): 24 min   Charges:   PT Evaluation $PT Eval Moderate Complexity: 1 Mod PT Treatments $Therapeutic Activity: 23-37 mins       12:37 PM, 05/27/21 Ocie Cornfield SPT  12:37 PM, 05/27/21 Ocie Bob, MPT Physical Therapist with [facility] [phone] office [phone] mobile phone

## 2021-05-28 ENCOUNTER — Encounter (HOSPITAL_COMMUNITY): Payer: Self-pay | Admitting: *Deleted

## 2021-05-28 ENCOUNTER — Other Ambulatory Visit: Payer: Self-pay

## 2021-05-28 DIAGNOSIS — E039 Hypothyroidism, unspecified: Secondary | ICD-10-CM | POA: Diagnosis not present

## 2021-05-28 DIAGNOSIS — E1159 Type 2 diabetes mellitus with other circulatory complications: Secondary | ICD-10-CM | POA: Diagnosis not present

## 2021-05-28 DIAGNOSIS — G4700 Insomnia, unspecified: Secondary | ICD-10-CM | POA: Diagnosis not present

## 2021-05-28 DIAGNOSIS — F329 Major depressive disorder, single episode, unspecified: Secondary | ICD-10-CM | POA: Diagnosis not present

## 2021-05-28 DIAGNOSIS — E1169 Type 2 diabetes mellitus with other specified complication: Secondary | ICD-10-CM | POA: Diagnosis not present

## 2021-05-28 DIAGNOSIS — I10 Essential (primary) hypertension: Secondary | ICD-10-CM | POA: Diagnosis not present

## 2021-05-28 DIAGNOSIS — E785 Hyperlipidemia, unspecified: Secondary | ICD-10-CM | POA: Diagnosis not present

## 2021-05-28 DIAGNOSIS — N1832 Chronic kidney disease, stage 3b: Secondary | ICD-10-CM

## 2021-05-28 DIAGNOSIS — E119 Type 2 diabetes mellitus without complications: Secondary | ICD-10-CM | POA: Diagnosis not present

## 2021-05-28 LAB — URINE CULTURE: Culture: >=100000 — AB

## 2021-05-30 ENCOUNTER — Other Ambulatory Visit: Payer: Self-pay | Admitting: *Deleted

## 2021-05-30 DIAGNOSIS — Z7984 Long term (current) use of oral hypoglycemic drugs: Secondary | ICD-10-CM | POA: Diagnosis not present

## 2021-05-30 DIAGNOSIS — Z8673 Personal history of transient ischemic attack (TIA), and cerebral infarction without residual deficits: Secondary | ICD-10-CM | POA: Diagnosis not present

## 2021-05-30 DIAGNOSIS — E1165 Type 2 diabetes mellitus with hyperglycemia: Secondary | ICD-10-CM | POA: Diagnosis not present

## 2021-05-30 DIAGNOSIS — G4700 Insomnia, unspecified: Secondary | ICD-10-CM | POA: Diagnosis not present

## 2021-05-30 DIAGNOSIS — I129 Hypertensive chronic kidney disease with stage 1 through stage 4 chronic kidney disease, or unspecified chronic kidney disease: Secondary | ICD-10-CM | POA: Diagnosis not present

## 2021-05-30 DIAGNOSIS — Z794 Long term (current) use of insulin: Secondary | ICD-10-CM | POA: Diagnosis not present

## 2021-05-30 DIAGNOSIS — J4530 Mild persistent asthma, uncomplicated: Secondary | ICD-10-CM | POA: Diagnosis not present

## 2021-05-30 DIAGNOSIS — N179 Acute kidney failure, unspecified: Secondary | ICD-10-CM | POA: Diagnosis not present

## 2021-05-30 DIAGNOSIS — F32A Depression, unspecified: Secondary | ICD-10-CM | POA: Diagnosis not present

## 2021-05-30 DIAGNOSIS — N1832 Chronic kidney disease, stage 3b: Secondary | ICD-10-CM | POA: Diagnosis not present

## 2021-05-30 DIAGNOSIS — Z7902 Long term (current) use of antithrombotics/antiplatelets: Secondary | ICD-10-CM | POA: Diagnosis not present

## 2021-05-30 DIAGNOSIS — G4733 Obstructive sleep apnea (adult) (pediatric): Secondary | ICD-10-CM | POA: Diagnosis not present

## 2021-05-30 DIAGNOSIS — E039 Hypothyroidism, unspecified: Secondary | ICD-10-CM | POA: Diagnosis not present

## 2021-05-30 DIAGNOSIS — E785 Hyperlipidemia, unspecified: Secondary | ICD-10-CM | POA: Diagnosis not present

## 2021-05-30 DIAGNOSIS — G9341 Metabolic encephalopathy: Secondary | ICD-10-CM | POA: Diagnosis not present

## 2021-05-30 DIAGNOSIS — N390 Urinary tract infection, site not specified: Secondary | ICD-10-CM | POA: Diagnosis not present

## 2021-05-30 DIAGNOSIS — M1030 Gout due to renal impairment, unspecified site: Secondary | ICD-10-CM | POA: Diagnosis not present

## 2021-05-30 DIAGNOSIS — Z6825 Body mass index (BMI) 25.0-25.9, adult: Secondary | ICD-10-CM | POA: Diagnosis not present

## 2021-05-30 DIAGNOSIS — E1122 Type 2 diabetes mellitus with diabetic chronic kidney disease: Secondary | ICD-10-CM | POA: Diagnosis not present

## 2021-05-30 DIAGNOSIS — E669 Obesity, unspecified: Secondary | ICD-10-CM | POA: Diagnosis not present

## 2021-05-30 DIAGNOSIS — Z7982 Long term (current) use of aspirin: Secondary | ICD-10-CM | POA: Diagnosis not present
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
## 2021-06-02 DIAGNOSIS — I129 Hypertensive chronic kidney disease with stage 1 through stage 4 chronic kidney disease, or unspecified chronic kidney disease: Secondary | ICD-10-CM | POA: Diagnosis not present

## 2021-06-02 DIAGNOSIS — N390 Urinary tract infection, site not specified: Secondary | ICD-10-CM | POA: Diagnosis not present

## 2021-06-02 DIAGNOSIS — E1165 Type 2 diabetes mellitus with hyperglycemia: Secondary | ICD-10-CM | POA: Diagnosis not present

## 2021-06-02 DIAGNOSIS — G9341 Metabolic encephalopathy: Secondary | ICD-10-CM | POA: Diagnosis not present

## 2021-06-02 DIAGNOSIS — N179 Acute kidney failure, unspecified: Secondary | ICD-10-CM | POA: Diagnosis not present

## 2021-06-02 DIAGNOSIS — E1122 Type 2 diabetes mellitus with diabetic chronic kidney disease: Secondary | ICD-10-CM | POA: Diagnosis not present

## 2021-06-03 DIAGNOSIS — Z8673 Personal history of transient ischemic attack (TIA), and cerebral infarction without residual deficits: Secondary | ICD-10-CM | POA: Diagnosis not present

## 2021-06-03 DIAGNOSIS — Z2009 Contact with and (suspected) exposure to other intestinal infectious diseases: Secondary | ICD-10-CM | POA: Diagnosis not present

## 2021-06-03 DIAGNOSIS — E559 Vitamin D deficiency, unspecified: Secondary | ICD-10-CM | POA: Diagnosis not present

## 2021-06-03 DIAGNOSIS — Z794 Long term (current) use of insulin: Secondary | ICD-10-CM | POA: Diagnosis not present

## 2021-06-03 DIAGNOSIS — R899 Unspecified abnormal finding in specimens from other organs, systems and tissues: Secondary | ICD-10-CM | POA: Diagnosis not present

## 2021-06-03 DIAGNOSIS — M109 Gout, unspecified: Secondary | ICD-10-CM | POA: Diagnosis not present

## 2021-06-03 DIAGNOSIS — E1169 Type 2 diabetes mellitus with other specified complication: Secondary | ICD-10-CM | POA: Diagnosis not present

## 2021-06-03 DIAGNOSIS — Z9119 Patient's noncompliance with other medical treatment and regimen: Secondary | ICD-10-CM | POA: Diagnosis not present

## 2021-06-03 DIAGNOSIS — I10 Essential (primary) hypertension: Secondary | ICD-10-CM | POA: Diagnosis not present

## 2021-06-04 ENCOUNTER — Other Ambulatory Visit: Payer: Self-pay | Admitting: *Deleted

## 2021-06-04 DIAGNOSIS — N390 Urinary tract infection, site not specified: Secondary | ICD-10-CM | POA: Diagnosis not present

## 2021-06-04 DIAGNOSIS — I129 Hypertensive chronic kidney disease with stage 1 through stage 4 chronic kidney disease, or unspecified chronic kidney disease: Secondary | ICD-10-CM | POA: Diagnosis not present

## 2021-06-04 DIAGNOSIS — N179 Acute kidney failure, unspecified: Secondary | ICD-10-CM | POA: Diagnosis not present

## 2021-06-04 DIAGNOSIS — E1165 Type 2 diabetes mellitus with hyperglycemia: Secondary | ICD-10-CM | POA: Diagnosis not present

## 2021-06-04 DIAGNOSIS — E1122 Type 2 diabetes mellitus with diabetic chronic kidney disease: Secondary | ICD-10-CM | POA: Diagnosis not present

## 2021-06-04 DIAGNOSIS — G9341 Metabolic encephalopathy: Secondary | ICD-10-CM | POA: Diagnosis not present
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
## 2021-06-05 ENCOUNTER — Other Ambulatory Visit: Payer: Self-pay | Admitting: *Deleted

## 2021-06-05 ENCOUNTER — Ambulatory Visit: Payer: Federal, State, Local not specified - PPO | Admitting: *Deleted

## 2021-06-05 DIAGNOSIS — N179 Acute kidney failure, unspecified: Secondary | ICD-10-CM | POA: Diagnosis not present

## 2021-06-05 DIAGNOSIS — G9341 Metabolic encephalopathy: Secondary | ICD-10-CM | POA: Diagnosis not present

## 2021-06-05 DIAGNOSIS — E1165 Type 2 diabetes mellitus with hyperglycemia: Secondary | ICD-10-CM | POA: Diagnosis not present

## 2021-06-05 DIAGNOSIS — N390 Urinary tract infection, site not specified: Secondary | ICD-10-CM | POA: Diagnosis not present

## 2021-06-05 DIAGNOSIS — E1122 Type 2 diabetes mellitus with diabetic chronic kidney disease: Secondary | ICD-10-CM | POA: Diagnosis not present

## 2021-06-05 DIAGNOSIS — I129 Hypertensive chronic kidney disease with stage 1 through stage 4 chronic kidney disease, or unspecified chronic kidney disease: Secondary | ICD-10-CM | POA: Diagnosis not present
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
## 2021-06-06 DIAGNOSIS — N390 Urinary tract infection, site not specified: Secondary | ICD-10-CM | POA: Diagnosis not present

## 2021-06-06 DIAGNOSIS — N179 Acute kidney failure, unspecified: Secondary | ICD-10-CM | POA: Diagnosis not present

## 2021-06-06 DIAGNOSIS — G9341 Metabolic encephalopathy: Secondary | ICD-10-CM | POA: Diagnosis not present

## 2021-06-06 DIAGNOSIS — I129 Hypertensive chronic kidney disease with stage 1 through stage 4 chronic kidney disease, or unspecified chronic kidney disease: Secondary | ICD-10-CM | POA: Diagnosis not present

## 2021-06-06 DIAGNOSIS — E1165 Type 2 diabetes mellitus with hyperglycemia: Secondary | ICD-10-CM | POA: Diagnosis not present

## 2021-06-06 DIAGNOSIS — E1122 Type 2 diabetes mellitus with diabetic chronic kidney disease: Secondary | ICD-10-CM | POA: Diagnosis not present

## 2021-06-09 DIAGNOSIS — N390 Urinary tract infection, site not specified: Secondary | ICD-10-CM | POA: Diagnosis not present

## 2021-06-09 DIAGNOSIS — G9341 Metabolic encephalopathy: Secondary | ICD-10-CM | POA: Diagnosis not present

## 2021-06-09 DIAGNOSIS — I129 Hypertensive chronic kidney disease with stage 1 through stage 4 chronic kidney disease, or unspecified chronic kidney disease: Secondary | ICD-10-CM | POA: Diagnosis not present

## 2021-06-09 DIAGNOSIS — N179 Acute kidney failure, unspecified: Secondary | ICD-10-CM | POA: Diagnosis not present

## 2021-06-09 DIAGNOSIS — E1165 Type 2 diabetes mellitus with hyperglycemia: Secondary | ICD-10-CM | POA: Diagnosis not present

## 2021-06-09 DIAGNOSIS — E1122 Type 2 diabetes mellitus with diabetic chronic kidney disease: Secondary | ICD-10-CM | POA: Diagnosis not present

## 2021-06-10 DIAGNOSIS — N179 Acute kidney failure, unspecified: Secondary | ICD-10-CM | POA: Diagnosis not present

## 2021-06-10 DIAGNOSIS — E1165 Type 2 diabetes mellitus with hyperglycemia: Secondary | ICD-10-CM | POA: Diagnosis not present

## 2021-06-10 DIAGNOSIS — N390 Urinary tract infection, site not specified: Secondary | ICD-10-CM | POA: Diagnosis not present

## 2021-06-10 DIAGNOSIS — I129 Hypertensive chronic kidney disease with stage 1 through stage 4 chronic kidney disease, or unspecified chronic kidney disease: Secondary | ICD-10-CM | POA: Diagnosis not present

## 2021-06-10 DIAGNOSIS — E1122 Type 2 diabetes mellitus with diabetic chronic kidney disease: Secondary | ICD-10-CM | POA: Diagnosis not present

## 2021-06-10 DIAGNOSIS — G9341 Metabolic encephalopathy: Secondary | ICD-10-CM | POA: Diagnosis not present

## 2021-06-14 DIAGNOSIS — E1165 Type 2 diabetes mellitus with hyperglycemia: Secondary | ICD-10-CM | POA: Diagnosis not present

## 2021-06-14 DIAGNOSIS — G9341 Metabolic encephalopathy: Secondary | ICD-10-CM | POA: Diagnosis not present

## 2021-06-14 DIAGNOSIS — E1122 Type 2 diabetes mellitus with diabetic chronic kidney disease: Secondary | ICD-10-CM | POA: Diagnosis not present

## 2021-06-14 DIAGNOSIS — N390 Urinary tract infection, site not specified: Secondary | ICD-10-CM | POA: Diagnosis not present

## 2021-06-14 DIAGNOSIS — N179 Acute kidney failure, unspecified: Secondary | ICD-10-CM | POA: Diagnosis not present

## 2021-06-14 DIAGNOSIS — I129 Hypertensive chronic kidney disease with stage 1 through stage 4 chronic kidney disease, or unspecified chronic kidney disease: Secondary | ICD-10-CM | POA: Diagnosis not present

## 2021-06-17 DIAGNOSIS — G9341 Metabolic encephalopathy: Secondary | ICD-10-CM | POA: Diagnosis not present

## 2021-06-17 DIAGNOSIS — I129 Hypertensive chronic kidney disease with stage 1 through stage 4 chronic kidney disease, or unspecified chronic kidney disease: Secondary | ICD-10-CM | POA: Diagnosis not present

## 2021-06-17 DIAGNOSIS — N390 Urinary tract infection, site not specified: Secondary | ICD-10-CM | POA: Diagnosis not present

## 2021-06-17 DIAGNOSIS — E1122 Type 2 diabetes mellitus with diabetic chronic kidney disease: Secondary | ICD-10-CM | POA: Diagnosis not present

## 2021-06-17 DIAGNOSIS — N179 Acute kidney failure, unspecified: Secondary | ICD-10-CM | POA: Diagnosis not present

## 2021-06-17 DIAGNOSIS — E1165 Type 2 diabetes mellitus with hyperglycemia: Secondary | ICD-10-CM | POA: Diagnosis not present

## 2021-06-18 ENCOUNTER — Other Ambulatory Visit: Payer: Self-pay

## 2021-06-18 ENCOUNTER — Other Ambulatory Visit: Payer: Self-pay | Admitting: *Deleted
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
## 2021-06-19 DIAGNOSIS — G9341 Metabolic encephalopathy: Secondary | ICD-10-CM | POA: Diagnosis not present

## 2021-06-19 DIAGNOSIS — N390 Urinary tract infection, site not specified: Secondary | ICD-10-CM | POA: Diagnosis not present

## 2021-06-19 DIAGNOSIS — E1122 Type 2 diabetes mellitus with diabetic chronic kidney disease: Secondary | ICD-10-CM | POA: Diagnosis not present

## 2021-06-19 DIAGNOSIS — N179 Acute kidney failure, unspecified: Secondary | ICD-10-CM | POA: Diagnosis not present

## 2021-06-19 DIAGNOSIS — I129 Hypertensive chronic kidney disease with stage 1 through stage 4 chronic kidney disease, or unspecified chronic kidney disease: Secondary | ICD-10-CM | POA: Diagnosis not present

## 2021-06-19 DIAGNOSIS — E1165 Type 2 diabetes mellitus with hyperglycemia: Secondary | ICD-10-CM | POA: Diagnosis not present

## 2021-06-20 DIAGNOSIS — E1165 Type 2 diabetes mellitus with hyperglycemia: Secondary | ICD-10-CM | POA: Diagnosis not present

## 2021-06-20 DIAGNOSIS — N390 Urinary tract infection, site not specified: Secondary | ICD-10-CM | POA: Diagnosis not present

## 2021-06-20 DIAGNOSIS — E1122 Type 2 diabetes mellitus with diabetic chronic kidney disease: Secondary | ICD-10-CM | POA: Diagnosis not present

## 2021-06-20 DIAGNOSIS — I129 Hypertensive chronic kidney disease with stage 1 through stage 4 chronic kidney disease, or unspecified chronic kidney disease: Secondary | ICD-10-CM | POA: Diagnosis not present

## 2021-06-20 DIAGNOSIS — G9341 Metabolic encephalopathy: Secondary | ICD-10-CM | POA: Diagnosis not present

## 2021-06-20 DIAGNOSIS — N179 Acute kidney failure, unspecified: Secondary | ICD-10-CM | POA: Diagnosis not present

## 2021-06-24 DIAGNOSIS — I129 Hypertensive chronic kidney disease with stage 1 through stage 4 chronic kidney disease, or unspecified chronic kidney disease: Secondary | ICD-10-CM | POA: Diagnosis not present

## 2021-06-24 DIAGNOSIS — G9341 Metabolic encephalopathy: Secondary | ICD-10-CM | POA: Diagnosis not present

## 2021-06-24 DIAGNOSIS — E1122 Type 2 diabetes mellitus with diabetic chronic kidney disease: Secondary | ICD-10-CM | POA: Diagnosis not present

## 2021-06-24 DIAGNOSIS — N179 Acute kidney failure, unspecified: Secondary | ICD-10-CM | POA: Diagnosis not present

## 2021-06-24 DIAGNOSIS — E1165 Type 2 diabetes mellitus with hyperglycemia: Secondary | ICD-10-CM | POA: Diagnosis not present

## 2021-06-24 DIAGNOSIS — N390 Urinary tract infection, site not specified: Secondary | ICD-10-CM | POA: Diagnosis not present

## 2021-06-25 DIAGNOSIS — N179 Acute kidney failure, unspecified: Secondary | ICD-10-CM | POA: Diagnosis not present

## 2021-06-25 DIAGNOSIS — I129 Hypertensive chronic kidney disease with stage 1 through stage 4 chronic kidney disease, or unspecified chronic kidney disease: Secondary | ICD-10-CM | POA: Diagnosis not present

## 2021-06-25 DIAGNOSIS — G9341 Metabolic encephalopathy: Secondary | ICD-10-CM | POA: Diagnosis not present

## 2021-06-25 DIAGNOSIS — N390 Urinary tract infection, site not specified: Secondary | ICD-10-CM | POA: Diagnosis not present

## 2021-06-25 DIAGNOSIS — E1122 Type 2 diabetes mellitus with diabetic chronic kidney disease: Secondary | ICD-10-CM | POA: Diagnosis not present

## 2021-06-25 DIAGNOSIS — E1165 Type 2 diabetes mellitus with hyperglycemia: Secondary | ICD-10-CM | POA: Diagnosis not present

## 2021-06-29 DIAGNOSIS — E1122 Type 2 diabetes mellitus with diabetic chronic kidney disease: Secondary | ICD-10-CM | POA: Diagnosis not present

## 2021-06-29 DIAGNOSIS — N179 Acute kidney failure, unspecified: Secondary | ICD-10-CM | POA: Diagnosis not present

## 2021-06-29 DIAGNOSIS — G9341 Metabolic encephalopathy: Secondary | ICD-10-CM | POA: Diagnosis not present

## 2021-06-29 DIAGNOSIS — Z7982 Long term (current) use of aspirin: Secondary | ICD-10-CM | POA: Diagnosis not present

## 2021-06-29 DIAGNOSIS — G4733 Obstructive sleep apnea (adult) (pediatric): Secondary | ICD-10-CM | POA: Diagnosis not present

## 2021-06-29 DIAGNOSIS — N1832 Chronic kidney disease, stage 3b: Secondary | ICD-10-CM | POA: Diagnosis not present

## 2021-06-29 DIAGNOSIS — Z7902 Long term (current) use of antithrombotics/antiplatelets: Secondary | ICD-10-CM | POA: Diagnosis not present

## 2021-06-29 DIAGNOSIS — J4530 Mild persistent asthma, uncomplicated: Secondary | ICD-10-CM | POA: Diagnosis not present

## 2021-06-29 DIAGNOSIS — F32A Depression, unspecified: Secondary | ICD-10-CM | POA: Diagnosis not present

## 2021-06-29 DIAGNOSIS — Z7984 Long term (current) use of oral hypoglycemic drugs: Secondary | ICD-10-CM | POA: Diagnosis not present

## 2021-06-29 DIAGNOSIS — Z794 Long term (current) use of insulin: Secondary | ICD-10-CM | POA: Diagnosis not present

## 2021-06-29 DIAGNOSIS — G4700 Insomnia, unspecified: Secondary | ICD-10-CM | POA: Diagnosis not present

## 2021-06-29 DIAGNOSIS — Z6825 Body mass index (BMI) 25.0-25.9, adult: Secondary | ICD-10-CM | POA: Diagnosis not present

## 2021-06-29 DIAGNOSIS — M1030 Gout due to renal impairment, unspecified site: Secondary | ICD-10-CM | POA: Diagnosis not present

## 2021-06-29 DIAGNOSIS — Z8673 Personal history of transient ischemic attack (TIA), and cerebral infarction without residual deficits: Secondary | ICD-10-CM | POA: Diagnosis not present

## 2021-06-29 DIAGNOSIS — I129 Hypertensive chronic kidney disease with stage 1 through stage 4 chronic kidney disease, or unspecified chronic kidney disease: Secondary | ICD-10-CM | POA: Diagnosis not present

## 2021-06-29 DIAGNOSIS — E669 Obesity, unspecified: Secondary | ICD-10-CM | POA: Diagnosis not present

## 2021-06-29 DIAGNOSIS — E039 Hypothyroidism, unspecified: Secondary | ICD-10-CM | POA: Diagnosis not present

## 2021-06-29 DIAGNOSIS — N390 Urinary tract infection, site not specified: Secondary | ICD-10-CM | POA: Diagnosis not present

## 2021-06-29 DIAGNOSIS — E1165 Type 2 diabetes mellitus with hyperglycemia: Secondary | ICD-10-CM | POA: Diagnosis not present

## 2021-06-29 DIAGNOSIS — E785 Hyperlipidemia, unspecified: Secondary | ICD-10-CM | POA: Diagnosis not present

## 2021-07-02 ENCOUNTER — Other Ambulatory Visit: Payer: Self-pay | Admitting: *Deleted

## 2021-07-02 ENCOUNTER — Other Ambulatory Visit: Payer: Self-pay

## 2021-07-02 NOTE — Patient Outreach (Signed)
[facility]) Care Management  07/02/2021  Caitlin Harrington [birth year] [id number]   [facility] follow up outreach   Outreached to patient number [phone] but no answer and her voice mail box remains full Outreached to Caitlin Harrington, significant other [phone], who answered and was able to verify HIPAA for pt   Assessment He reports the patient is doing well  [facility] to visit Caitlin Harrington home (listed in [place], near [place]) where he reports the patient "rents" a room and continues to want to stay. "She is adamant about not going to stay with her son or grand daughter" RN CM inquired about making his address temporary but he will follow up with RN CM later on the change He voices concerns about inconsistency with home health staff calling but not arriving when stated which is reported to interrupt his sleep. RN CM attempted to assess a standard set time the home health staff would be able to visit the home He reports his daily schedule varies, he would not prefer anyone in the home when he is not there and would prefer they come at the time agreed upon with him Home health staff members "Caitlin Harrington", "Caitlin Harrington" and a female have visited. Caitlin Harrington states he would not prefer Caitlin Harrington not return Has a hired care aide is usually there at 8 am for an hour He reports Caitlin Harrington is not able to ambulate to open the door for home health staff  RN CM attempted to discuss the option of outpatient therapy to allow a set time pt is able to go out of the home to therapy. The phone line disconnected and RN CM returned a call to Caitlin Harrington who stated he drove in at "drop zone" He updates RN CM that he does not prefer outpatient services He states he has to be called every day as his schedule changes daily  Medications He discussed that he and the aide are doing well with medication adherence, filling containers for pt He reports he is driving and can not recall her  medications. He questions why a referred pharmacist would want to review the medications. RN CM explained to him the importance of medication reconciliation. He voices understanding but then reports he does not believe the pharmacy referral is needed any longer.  Now he states he only paid $200 for medications using [place] [facility] RN CM collaborated with [facility] CMA to confirm information on the pharmacy referral but had her to cancel the referral when Caitlin Harrington stated it was no longer needed  Diabetes Caitlin Harrington reports she has a new continuous glucometer that he and the hired staff need education on Not able to check her insulin for 4 days  Unable to understand the use of the DME     Plans Patient agrees to care plan and follow up within the next 30 business days  Caitlin Hollar L. [name], RN, BSN, CCM [facility] Telephonic Care Management Care Coordinator Office number [phone] Main [facility] number [phone] Fax number [phone]

## 2021-07-03 DIAGNOSIS — N179 Acute kidney failure, unspecified: Secondary | ICD-10-CM | POA: Diagnosis not present

## 2021-07-03 DIAGNOSIS — E1122 Type 2 diabetes mellitus with diabetic chronic kidney disease: Secondary | ICD-10-CM | POA: Diagnosis not present

## 2021-07-03 DIAGNOSIS — G9341 Metabolic encephalopathy: Secondary | ICD-10-CM | POA: Diagnosis not present

## 2021-07-03 DIAGNOSIS — N390 Urinary tract infection, site not specified: Secondary | ICD-10-CM | POA: Diagnosis not present

## 2021-07-03 DIAGNOSIS — I129 Hypertensive chronic kidney disease with stage 1 through stage 4 chronic kidney disease, or unspecified chronic kidney disease: Secondary | ICD-10-CM | POA: Diagnosis not present

## 2021-07-03 DIAGNOSIS — E1165 Type 2 diabetes mellitus with hyperglycemia: Secondary | ICD-10-CM | POA: Diagnosis not present

## 2021-07-09 ENCOUNTER — Other Ambulatory Visit: Payer: Self-pay | Admitting: *Deleted

## 2021-07-09 NOTE — Patient Outreach (Signed)
[facility]) Care Management  07/09/2021  Avia Merkley 1946-02-22 [id number]   [facility] Care coordination- Collaboration with pcp and home health for home Diabetes management   Attempted to speak [facility] staff about Mr [name] concerns with Thurston Hole and the Case manager, Victorino Dike at [phone] but they are not able to find the patient is active for care services. Her inpatient TOC RN CM note and [name] both states she was to be followed up by [facility].   Outreach to [facility] [phone] and spoke with Lorene Dy to leave a message for the nurse. Lorene Dy notes patient listed as Wylene Weissman not Phelan Goers in their system Lorene Dy confirms pt to be seen by primary care provider (PCP) on 07/11/21. Encouraged her to put in the note also for staff to assist with education on use of patient present glucose monitor  Outreached again to [facility] [phone] for patient using the last name shared by pcp office Spoke with Thurston Hole and Victorino Dike  Discussed the caregiver concerns with timing of visits and education needed on Dexcom Victorino Dike can get a nurse to assist with the review of use of the Dexcom Victorino Dike also shared concerns with [name] outbursts with Home health staff documented and other options offered similar to options offered by RN CM without success Seen on 07/03/21 by [facility] staff and next visit on 07/10/21  Received a message from [name], PharmD to updated from pcp office that the Dexcom will be reviewed again during the 07/11/21 visit RN CM left Ephriam Knuckles a message thanking her and to update that [facility] staff will also review the Dexcom use on 07/10/21  Left a message for [name] to update on education to be provided by Home health staff 07/10/21 and pcp Pharm D on 07/11/21 Encouraged him to take all DME and supplies to 07/11/21 pcp visit  Lab Results  Component Value Date   HGBA1C 10.7 (H) 05/03/2021     Patient  Active Problem List   Diagnosis Date Noted   CKD (chronic kidney disease) stage 3, GFR 30-59 ml/min (HCC) 05/26/2021   Uncontrolled type 2 diabetes mellitus with hyperglycemia, with long-term current use of insulin (HCC) 05/26/2021   Unsteady gait    CVA (cerebral vascular accident) (HCC) 05/04/2021   Acute CVA (cerebrovascular accident) (HCC) 05/04/2021   Pain in joint involving ankle and foot 11/06/2020   Acute renal insufficiency 11/06/2020   Allergic rhinitis 11/06/2020   Arthropathy 11/06/2020   Benign essential hypertension 11/06/2020   Bilateral carpal tunnel syndrome 11/06/2020   Chronic sinusitis 11/06/2020   Diverticulitis of colon 11/06/2020   Eczema 11/06/2020   Headache 11/06/2020   Long term (current) use of insulin (HCC) 11/06/2020   Acute metabolic encephalopathy 11/06/2020   Noncompliance with treatment 11/06/2020   Obstructive sleep apnea syndrome 11/06/2020   Other specified abnormal findings of blood chemistry 11/06/2020   Paresthesias 11/06/2020   Personal history of transient ischemic attack (TIA), and cerebral infarction without residual deficits 11/06/2020   Rash 11/06/2020   Depression 11/06/2020   Skin sensation disturbance 11/06/2020   Subclinical hypothyroidism 11/06/2020   Tick bite 11/06/2020   Tiredness 11/06/2020   Unspecified abnormal finding in specimens from other organs, systems and tissues 11/06/2020   Cerebral thrombosis with cerebral infarction 10/29/2020   Major depressive disorder, recurrent episode, moderate (HCC) 10/27/2020   Allergic reaction 10/26/2020   AKI (acute kidney injury) (HCC) 10/26/2020  Thrush 10/26/2020   Urinary tract infection without hematuria 05/12/2020   Encephalopathy 05/12/2020   Altered mental status    Hyperglycemia    Acute on chronic respiratory failure with hypoxia (HCC) 01/03/2020   COVID-19 12/30/2019   Hyperuricemia    Hypertension 02/18/2011   Prolonged depressive adjustment reaction 02/18/2011    Obesity 02/18/2011   Asthma, mild persistent 02/18/2011   Vitamin D deficiency 02/18/2011   Hyperlipidemia, unspecified 02/18/2011   Diabetes mellitus type 2, controlled, without complications (HCC) 02/18/2011   Insomnia 02/18/2011   Stress at work 02/18/2011    Plan [facility] RN CM will follow up with patient within the next 30 business days  Esha Fincher L. [name], RN, BSN, CCM [facility] Telephonic Care Management Care Coordinator Office number [phone] Mobile number [phone]  Main [facility] number [phone] Fax number [phone]

## 2021-07-10 DIAGNOSIS — G9341 Metabolic encephalopathy: Secondary | ICD-10-CM | POA: Diagnosis not present

## 2021-07-10 DIAGNOSIS — E1122 Type 2 diabetes mellitus with diabetic chronic kidney disease: Secondary | ICD-10-CM | POA: Diagnosis not present

## 2021-07-10 DIAGNOSIS — N390 Urinary tract infection, site not specified: Secondary | ICD-10-CM | POA: Diagnosis not present

## 2021-07-10 DIAGNOSIS — I129 Hypertensive chronic kidney disease with stage 1 through stage 4 chronic kidney disease, or unspecified chronic kidney disease: Secondary | ICD-10-CM | POA: Diagnosis not present

## 2021-07-10 DIAGNOSIS — N179 Acute kidney failure, unspecified: Secondary | ICD-10-CM | POA: Diagnosis not present

## 2021-07-10 DIAGNOSIS — E1165 Type 2 diabetes mellitus with hyperglycemia: Secondary | ICD-10-CM | POA: Diagnosis not present

## 2021-07-11 DIAGNOSIS — E1159 Type 2 diabetes mellitus with other circulatory complications: Secondary | ICD-10-CM | POA: Diagnosis not present

## 2021-07-11 DIAGNOSIS — M109 Gout, unspecified: Secondary | ICD-10-CM | POA: Diagnosis not present

## 2021-07-11 DIAGNOSIS — R899 Unspecified abnormal finding in specimens from other organs, systems and tissues: Secondary | ICD-10-CM | POA: Diagnosis not present

## 2021-07-11 DIAGNOSIS — Z8673 Personal history of transient ischemic attack (TIA), and cerebral infarction without residual deficits: Secondary | ICD-10-CM | POA: Diagnosis not present

## 2021-07-11 DIAGNOSIS — Z2009 Contact with and (suspected) exposure to other intestinal infectious diseases: Secondary | ICD-10-CM | POA: Diagnosis not present

## 2021-07-11 DIAGNOSIS — E1169 Type 2 diabetes mellitus with other specified complication: Secondary | ICD-10-CM | POA: Diagnosis not present

## 2021-07-11 DIAGNOSIS — E559 Vitamin D deficiency, unspecified: Secondary | ICD-10-CM | POA: Diagnosis not present

## 2021-07-11 DIAGNOSIS — F329 Major depressive disorder, single episode, unspecified: Secondary | ICD-10-CM | POA: Diagnosis not present

## 2021-07-11 DIAGNOSIS — Z794 Long term (current) use of insulin: Secondary | ICD-10-CM | POA: Diagnosis not present

## 2021-07-11 DIAGNOSIS — R4189 Other symptoms and signs involving cognitive functions and awareness: Secondary | ICD-10-CM | POA: Diagnosis not present

## 2021-07-11 DIAGNOSIS — I10 Essential (primary) hypertension: Secondary | ICD-10-CM | POA: Diagnosis not present

## 2021-07-11 DIAGNOSIS — Z9119 Patient's noncompliance with other medical treatment and regimen: Secondary | ICD-10-CM | POA: Diagnosis not present

## 2021-07-14 DIAGNOSIS — F329 Major depressive disorder, single episode, unspecified: Secondary | ICD-10-CM | POA: Diagnosis not present

## 2021-07-14 DIAGNOSIS — N1831 Chronic kidney disease, stage 3a: Secondary | ICD-10-CM | POA: Diagnosis not present

## 2021-07-14 DIAGNOSIS — E119 Type 2 diabetes mellitus without complications: Secondary | ICD-10-CM | POA: Diagnosis not present

## 2021-07-14 DIAGNOSIS — I10 Essential (primary) hypertension: Secondary | ICD-10-CM | POA: Diagnosis not present

## 2021-07-14 DIAGNOSIS — G4700 Insomnia, unspecified: Secondary | ICD-10-CM | POA: Diagnosis not present

## 2021-07-14 DIAGNOSIS — E1165 Type 2 diabetes mellitus with hyperglycemia: Secondary | ICD-10-CM | POA: Diagnosis not present

## 2021-07-14 DIAGNOSIS — E785 Hyperlipidemia, unspecified: Secondary | ICD-10-CM | POA: Diagnosis not present

## 2021-07-14 DIAGNOSIS — E039 Hypothyroidism, unspecified: Secondary | ICD-10-CM | POA: Diagnosis not present

## 2021-07-14 DIAGNOSIS — E1169 Type 2 diabetes mellitus with other specified complication: Secondary | ICD-10-CM | POA: Diagnosis not present

## 2021-07-14 DIAGNOSIS — E1159 Type 2 diabetes mellitus with other circulatory complications: Secondary | ICD-10-CM | POA: Diagnosis not present

## 2021-07-17 DIAGNOSIS — G9341 Metabolic encephalopathy: Secondary | ICD-10-CM | POA: Diagnosis not present

## 2021-07-17 DIAGNOSIS — E1165 Type 2 diabetes mellitus with hyperglycemia: Secondary | ICD-10-CM | POA: Diagnosis not present

## 2021-07-17 DIAGNOSIS — N179 Acute kidney failure, unspecified: Secondary | ICD-10-CM | POA: Diagnosis not present

## 2021-07-17 DIAGNOSIS — E1122 Type 2 diabetes mellitus with diabetic chronic kidney disease: Secondary | ICD-10-CM | POA: Diagnosis not present

## 2021-07-17 DIAGNOSIS — N390 Urinary tract infection, site not specified: Secondary | ICD-10-CM | POA: Diagnosis not present

## 2021-07-17 DIAGNOSIS — I129 Hypertensive chronic kidney disease with stage 1 through stage 4 chronic kidney disease, or unspecified chronic kidney disease: Secondary | ICD-10-CM | POA: Diagnosis not present

## 2021-07-25 DIAGNOSIS — N390 Urinary tract infection, site not specified: Secondary | ICD-10-CM | POA: Diagnosis not present

## 2021-07-25 DIAGNOSIS — Z8673 Personal history of transient ischemic attack (TIA), and cerebral infarction without residual deficits: Secondary | ICD-10-CM | POA: Diagnosis not present

## 2021-07-25 DIAGNOSIS — G9341 Metabolic encephalopathy: Secondary | ICD-10-CM | POA: Diagnosis not present

## 2021-07-25 DIAGNOSIS — I129 Hypertensive chronic kidney disease with stage 1 through stage 4 chronic kidney disease, or unspecified chronic kidney disease: Secondary | ICD-10-CM | POA: Diagnosis not present

## 2021-07-25 DIAGNOSIS — E1122 Type 2 diabetes mellitus with diabetic chronic kidney disease: Secondary | ICD-10-CM | POA: Diagnosis not present

## 2021-07-25 DIAGNOSIS — E1165 Type 2 diabetes mellitus with hyperglycemia: Secondary | ICD-10-CM | POA: Diagnosis not present

## 2021-07-25 DIAGNOSIS — E1159 Type 2 diabetes mellitus with other circulatory complications: Secondary | ICD-10-CM | POA: Diagnosis not present

## 2021-07-25 DIAGNOSIS — N179 Acute kidney failure, unspecified: Secondary | ICD-10-CM | POA: Diagnosis not present

## 2021-07-29 ENCOUNTER — Other Ambulatory Visit: Payer: Self-pay

## 2021-07-29 ENCOUNTER — Other Ambulatory Visit: Payer: Self-pay | Admitting: *Deleted

## 2021-07-29 DIAGNOSIS — E1165 Type 2 diabetes mellitus with hyperglycemia: Secondary | ICD-10-CM | POA: Diagnosis not present

## 2021-07-29 DIAGNOSIS — F329 Major depressive disorder, single episode, unspecified: Secondary | ICD-10-CM | POA: Diagnosis not present

## 2021-07-29 DIAGNOSIS — E1169 Type 2 diabetes mellitus with other specified complication: Secondary | ICD-10-CM | POA: Diagnosis not present

## 2021-07-29 DIAGNOSIS — E785 Hyperlipidemia, unspecified: Secondary | ICD-10-CM | POA: Diagnosis not present

## 2021-07-29 DIAGNOSIS — E119 Type 2 diabetes mellitus without complications: Secondary | ICD-10-CM | POA: Diagnosis not present

## 2021-07-29 DIAGNOSIS — E039 Hypothyroidism, unspecified: Secondary | ICD-10-CM | POA: Diagnosis not present

## 2021-07-29 DIAGNOSIS — E1159 Type 2 diabetes mellitus with other circulatory complications: Secondary | ICD-10-CM | POA: Diagnosis not present

## 2021-07-29 DIAGNOSIS — I10 Essential (primary) hypertension: Secondary | ICD-10-CM | POA: Diagnosis not present

## 2021-07-29 DIAGNOSIS — N1831 Chronic kidney disease, stage 3a: Secondary | ICD-10-CM | POA: Diagnosis not present

## 2021-07-29 DIAGNOSIS — G4700 Insomnia, unspecified: Secondary | ICD-10-CM | POA: Diagnosis not present

## 2021-07-29 NOTE — Patient Outreach (Signed)
[facility]) Care Management  07/29/2021  Nuria Phebus 01/21/1946 [id number]   [facility] follow up outreach   Ms Annet Manukyan (also known by the last name Niedermeier) was referred to [facility] on 05/28/21 by [facility] hospital liaison for post hospital/complex care services after admission for stroke and Diabetes Ms [name] was previously active with [facility], [facility], [facility] from late 2021 to 12/27/20 (unable to maintain contact with her) and unsuccessful [facility] engagement attempts in July 2021     Insurance blue cross and blue shield medicare Last admission ([place])  7/3-/75/22  acute metabolic encephalopathy, UTI, dehydration,  acute on CKD 3b, Hypoglycemia 6/11-15/22 multifocal ischemic stroke, DM 2 HgA1c 10.7, high chol/LDL   Outreached to the preferred listed number on EPIC [phone]  Mr Tawni Pummel, significant other answered and was able to verify HIPAA for pt     Assessment Mr Dorinda Hill report the patient is doing well except some short term memory concerns He reports Mrs [name] has an upcoming neurology consult in October 2022 to evaluate short term memory concerns  Knowledge of durable medical equipment (DME) Mr Dorinda Hill confirms resolved issue with the application of [name] continuous glucose monitor and pending review of the saved information by the MD office staff  He is now able to apply and remove the Dexcom to [name] abdomen  Attending appointments He confirms she was seen at Dr Nash Dimmer office for a 6 weeks follow up and she is not scheduled to return to be seen again in another 3 months. He reports this office visit went very well   Medication concerns continue [name] reports during the pcp visit the office pharmacy staff was seen and he reviewed the medicine management concerns. He reports he was encouraged to outreach to [employer] [facility]) to review patient medication management plan for the upcoming year He has not done this as he reports caution  with "don't understand insurance enough" He is still reporting primarily cost concerns with pt diabetic medicines and supplies  Agreed upon plan of care  RN CM offered to Collaboration with Melanie Crazier MD pharmacy staff and/or conference with him to [facility] to gather information that may assist them to determine if Mrs [name] would benefit from another medicare advantage plans to support the cost of her medications  RN CM interventions  Outreached to Dr Modesto Charon office [phone] to speak with the clinical pharmacist, christian Stephannie Li saw Mrs [name] and [name] during the Chronic care manage visit on 07/11/21 Ephriam Knuckles reviewed patient's medications, offered information about SHII[facility] and provided a sample for the Dexcom The Dexcom is not covered under [name] [facility] care mark part D services  The Free style Josephine Igo is believed to be covered  [name] discussed his interest in having mail order delivery of medications Ephriam Knuckles shared Mariane Masters number [phone]) at the insurance drop for possible assistance similar to [facility] to [name] They are scheduled to follow up with Christian on 08/08/21  During the office visit a few medicines with changed like the Lantus ws increased to 20 vs 15 During the 08/08/21 the HgA1c  should be completed and a determination will be made to discontinue Januvia and start Jardiance prn    Plans Patient agrees to care plan and follow up within the next 30 business days   Goals Addressed               This Visit's Progress     Patient  Stated     Manage My Medicine [facility]) (pt-stated)   On track     Timeframe:  Short-Term Goal Priority:  High Start Date:              06/18/21               Expected End Date:         08/22/21              Follow Up Date 08/15/21    - keep a list of all the medicines I take; vitamins and herbals too  -consult with office pharmacy staff  Notes:  07/29/21 continue voice diabetic medication and supply concerns Seen by  pcp pharmacy. Agreed to have RN Cm consult with pcp pharmacy staff and assist with outreach to [facility] 07/02/21 Caregiver and hired aide reported to be managing medicines All medicines purchased. Caregiver requested [facility] pharmacy referral be cancelled 06/18/21 reports cost and adherence concerns, Significant other and [facility] RN assist with filling pill containers. On a fixed income Paid >$900 for 90 day supply of medications for July 2022      Monitor and Manage My Blood Sugar-Diabetes Type 2 ([facility]) (pt-stated)   On track     Timeframe:  Long-Range Goal Priority:  High Start Date:                            07/09/21 Expected End Date:                     09/22/21  Follow Up Date 08/15/21    - check blood sugar at prescribed times - take the blood sugar meter to all doctor visits      Notes:  07/29/21 Continue use of Dexcom. Dexcom was taken to 07/11/21 pcp appointment during the consultation with pcp pharmacy staff  07/02/21 caregiver reports need assistance with education on continuous glucose monitor         Farrie Sann L. [name], RN, BSN, CCM [facility] Telephonic Care Management Care Coordinator Office number [phone] Main [facility] number [phone] Fax number [phone]

## 2021-08-15 ENCOUNTER — Other Ambulatory Visit: Payer: Self-pay

## 2021-08-15 ENCOUNTER — Other Ambulatory Visit: Payer: Self-pay | Admitting: *Deleted

## 2021-08-15 NOTE — Patient Outreach (Addendum)
[facility]) Care Management  08/15/2021  Caitlin Harrington [birth year] [id number]   [facility] follow up outreach   Ms Caitlin Harrington (also known by the last name Caitlin Harrington) was referred to [facility] on 05/28/21 by [facility] hospital liaison for post hospital/complex care services after admission for stroke and Diabetes Ms Caitlin Harrington was previously active with [facility] from late 2021 to 12/27/20 (unable to maintain contact with her) and unsuccessful [facility] engagement attempts in July 2021     Insurance blue cross and blue shield medicare   Last admission ([facility])  7/3-/75/22  acute metabolic encephalopathy, UTI, dehydration,  acute on CKD 3b, Hypoglycemia 6/11-15/22 multifocal ischemic stroke, DM 2 HgA1c 10.7, high chol/LDL     Outreached to the preferred listed number on EPIC [phone]  Mr Caitlin Harrington, significant other answered and was able to verify HIPAA for pt     Assessment  Today Mr Caitlin Harrington reports [name] She speaks in the background and reports she is taking her medications but had just completed breakfast Mr Caitlin Harrington reminds her to take certain medications   Insurance coordination RN CM review the 07/29/21 RN CM outreach to the primary care provider (PCP) pharmacy staff, Stephannie Li (who he and pt saw on 07/11/21) with Mr Caitlin Harrington.  Reviewed the Dexcom and medicines Caitlin Harrington discussed.  Reviewed the business card given to Mr Caitlin Harrington by Caitlin Harrington for Caitlin Harrington, Health insurance shop program. Mr Caitlin Harrington was offered the option of he and RN CM outreaching to Mr Caitlin Harrington or Caitlin Harrington [employer] Insurance Information Program [place]) program. His preference is to outreach to Mr Caitlin Harrington for assistance with [name]'s upcoming insurance coverage coordination that may assist with the cost of any upcoming medicines and supplies   RN CM interventions Outreach conference with Mr Caitlin Harrington to Mr Caitlin Harrington. The initial number offered by Caitlin Harrington was incorrect [phone]) as confirmed by Mr Caitlin Harrington when  he found the business card she provided. He and RN CM were able to leave a message at Mr Tia Masker mobile number [phone]) before reaching Nils Flack at his office [phone]) to discuss the need to speak with Mr Caitlin Harrington for services. Mr Dorinda Hill was able to discuss with Caitlin Harrington the need for Mr Caitlin Harrington to return a call to schedule an appointment. RN CM offered her number in case she is needed during this outreach. Caitlin Harrington will provide this information to Mr Caitlin Harrington. RN CM concluded the call with Mr Caitlin Harrington as he needed to complete some tasks prior to starting his work day.  RN CM outreached to [place] to inquire if any other action or resource was needed. Christie voiced appreciation.  Sent e-mail for my chart activation to pt  Plans Patient/Mr Muncon agrees to care plan and follow up within the next 30 business days   Goals Addressed               This Visit's Progress     Patient Stated     Find Help in My Community [facility]) (pt-stated)   On track     Timeframe:  Short-Term Goal Priority:  Medium Start Date:                            07/29/21 Expected End Date:      09/21/21                 Follow Up Date 08/29/21    -  follow-up on any referrals for help I am given - have a back-up plan     Notes:  08/15/21 Significant other agree to outreach to [employer] shop staff Caitlin Harrington for appointment and assistance        Manage My Medicine [facility]) (pt-stated)   On track     Timeframe:  Short-Term Goal Priority:  High Start Date:              06/18/21               Expected End Date:         09/21/21              Follow Up Date 08/29/21    - keep a list of all the medicines I take; vitamins and herbals too  -consult with office pharmacy staff -Find help in community    Notes:  08/15/21 taking medicines independently with prompting from significant other. Significant other agree to outreach to [employer] staff Caitlin Harrington for appointment and assistance  07/29/21  continue voice diabetic medication and supply concerns Seen by pcp pharmacy. Agreed to have RN Cm consult with pcp pharmacy staff and assist with outreach to [facility] 07/02/21 Caregiver and hired aide reported to be managing medicines All medicines purchased. Caregiver requested [facility] pharmacy referral be cancelled 06/18/21 reports cost and adherence concerns, Significant other and [facility] RN assist with filling pill containers. On a fixed income Paid >$900 for 90 day supply of medications for July 2022      Monitor and Manage My Blood Sugar-Diabetes Type 2 ([facility]) (pt-stated)   On track     Timeframe:  Long-Range Goal Priority:  High Start Date:                            07/09/21 Expected End Date:                     09/22/21  Follow Up Date 08/15/21    - check blood sugar at prescribed times - take the blood sugar meter to all doctor visits     Notes:  07/29/21 Continue use of Dexcom. Dexcom was taken to 07/11/21 pcp appointment during the consultation with pcp pharmacy staff  07/02/21 caregiver reports need assistance with education on continuous glucose monitor        Saprina Chuong L. [name], RN, BSN, CCM [facility] Telephonic Care Management Care Coordinator Office number [phone] [facility] number [phone] Fax number [phone]

## 2021-08-15 NOTE — Patient Outreach (Signed)
[facility]) Care Management  08/15/2021  Caitlin Harrington Mar 11, 1946 [id number]   [facility] Care coordination-collaboration with health insurance provider  Mr [name] [phone] outreached to RN CM  He has outreached to [name] to schedule an appointment for assistance Mrs [name] case concerns reviewed with him No Power of Attorney ([place]) for pt Shared Mr [name] significance to pt and willingness to assist with Mrs [name] Shared [name] has not agreed to review pt's medication with RN CM at this time Referred him to [name], Pharmacist at Dr Modesto Charon office for a better reconciled medicine list  Plan [facility] RN CM will follow up with patient within the next 30 business days and continue to collaborate with the patient and agreed providers    [name] L. [name], RN, BSN, CCM [facility] Telephonic Care Management Care Coordinator Office number [phone] Mobile number [phone]  Main [facility] number [phone] Fax number [phone]

## 2021-08-21 ENCOUNTER — Encounter: Payer: Self-pay | Admitting: *Deleted

## 2021-08-25 ENCOUNTER — Encounter: Payer: Self-pay | Admitting: Diagnostic Neuroimaging

## 2021-08-25 ENCOUNTER — Other Ambulatory Visit: Payer: Self-pay

## 2021-08-25 ENCOUNTER — Ambulatory Visit (INDEPENDENT_AMBULATORY_CARE_PROVIDER_SITE_OTHER): Payer: Medicare Other | Admitting: Diagnostic Neuroimaging

## 2021-08-25 VITALS — BP 125/84 | HR 59 | Ht 60.0 in | Wt 185.6 lb

## 2021-08-25 DIAGNOSIS — I6310 Cerebral infarction due to embolism of unspecified precerebral artery: Secondary | ICD-10-CM | POA: Diagnosis not present

## 2021-08-25 DIAGNOSIS — E1165 Type 2 diabetes mellitus with hyperglycemia: Secondary | ICD-10-CM

## 2021-08-25 DIAGNOSIS — Z794 Long term (current) use of insulin: Secondary | ICD-10-CM | POA: Diagnosis not present

## 2021-08-25 DIAGNOSIS — I639 Cerebral infarction, unspecified: Secondary | ICD-10-CM | POA: Diagnosis not present
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
## 2021-08-25 NOTE — Patient Instructions (Addendum)
   STROKE PREVENTION - continue aspirin, plavix, statin, DM, BP control  MEMORY LOSS  - likely related to stroke and possible MCI (mild cognitive impairment) - safety / supervision issues reviewed - daily physical activity / exercise (at least 15-30 minutes) - eat more plants / vegetables - increase social activities, brain stimulation, games, puzzles, hobbies, crafts, arts, music - aim for at least 7-8 hours sleep per night (or more) - avoid smoking and alcohol - caregiver resources provided - caution with medications, finances, driving

## 2021-08-26 ENCOUNTER — Encounter: Payer: Self-pay | Admitting: Diagnostic Neuroimaging

## 2021-08-29 ENCOUNTER — Other Ambulatory Visit: Payer: Self-pay | Admitting: *Deleted

## 2021-08-29 ENCOUNTER — Other Ambulatory Visit: Payer: Self-pay

## 2021-08-29 NOTE — Patient Outreach (Addendum)
[facility]) Care Management  08/29/2021  Caitlin Harrington Dec 20, 1945 [id number]  [facility] outreach to complex care patient   Ms Caitlin Harrington (also known by the last name Ransom) was referred to [facility] on 05/28/21 by [facility] hospital liaison for post hospital/complex care services after admission for stroke and Diabetes Ms Caitlin Harrington was previously active with [facility] from late 2021 to 12/27/20 (unable to maintain contact with her) and unsuccessful [facility] engagement attempts in July 2021     Insurance blue cross and blue shield medicare  Last admission (cone)  7/3-/75/22  acute metabolic encephalopathy, UTI, dehydration,  acute on CKD 3b, Hypoglycemia 6/11-15/22 multifocal ischemic stroke, DM 2 HgA1c 10.7, high chol/LDL     Outreached to the preferred listed number on EPIC [phone]  Mr Tawni Pummel, significant other answered and was able to verify HIPAA for pt     Assessment   Today Mr Caitlin Harrington reports [name] They are on the way [facility] [place] because the pt is out of them  Mr Caitlin Harrington states he is attempting to work with the pharmacy to get the pt's medications all filled at the same time He is also taking the pt to get her nails done today   Insurance coordination Mr Caitlin Harrington reports he has not heard from Mr [name] since 08/15/21 to schedule an appointment.  Plans Patient/Mr Muncon agrees to care plan and follow up within the next 30 business days if Mr Caitlin Harrington does not return a call to RN CM as he stated within the next few days  Goals Addressed               This Visit's Progress     Patient Stated     Find Help in My Community [facility]) (pt-stated)        Timeframe:  Short-Term Goal Priority:  Medium Start Date:                            07/29/21 Expected End Date:      09/21/21                 Follow Up Date 09/12/21  Barriers: Knowledge    - follow-up on any referrals for help I am given - have a back-up  plan    Why is this important?   Knowing how and where to find help for yourself or family in your neighborhood and community is an important skill.  You will want to take some steps to learn how.    Notes:  08/29/21 no outreach with [name] completed  08/15/21 Significant other agree to outreach to [employer] staff [name] for appointment and assistance        Manage My Medicine [facility]) (pt-stated)        Timeframe:  Short-Term Goal Priority:  High Start Date:              06/18/21               Expected End Date:         09/21/21              Follow Up Date 09/12/21 Barriers: Knowledge     - keep a list of all the medicines I take; vitamins and herbals too  -consult with office pharmacy staff -Find help in community   Why is  this important?   These steps will help you keep on track with your medicines.   Notes:  08/29/21 Partner continues with voiced concern about medication management[facility] 08/15/21 taking medicines independently with prompting from significant other. Significant other agree to outreach to [employer] staff [name] for appointment and assistance  07/29/21 continue voice diabetic medication and supply concerns Seen by pcp pharmacy. Agreed to have RN Cm consult with pcp pharmacy staff and assist with outreach to [facility] 07/02/21 Caregiver and hired aide reported to be managing medicines All medicines purchased. Caregiver requested [facility] pharmacy referral be cancelled 06/18/21 reports cost and adherence concerns, Significant other and [facility] RN assist with filling pill containers. On a fixed income Paid >$900 for 90 day supply of medications for July 2022      Monitor and Manage My Blood Sugar-Diabetes Type 2 (THN) (pt-stated)   On track     Timeframe:  Long-Range Goal Priority:  High Start Date:                            07/09/21 Expected End Date:                     09/22/21  Follow Up Date 09/12/21   Barriers: Knowledge    - check blood sugar at prescribed times - take the blood sugar meter to all doctor visits    Why is this important?   Checking your blood sugar at home helps to keep it from getting very high or very low.  Writing the results in a diary or log helps the doctor know how to care for you.  Your blood sugar log should have the time, date and the results.  Also, write down the amount of insulin or other medicine that you take.  Other information, like what you ate, exercise done and how you were feeling, will also be helpful.     Notes:  08/29/21 continues use of Dexcom. No voiced concerns Managing well 07/29/21 Continue use of Dexcom. Dexcom was taken to 07/11/21 pcp appointment during the consultation with pcp pharmacy staff  07/02/21 caregiver reports need assistance with education on continuous glucose monitor        Shainna Faux L. [name], RN, BSN, CCM [facility] Telephonic Care Management Care Coordinator Office number [phone] Main [facility] number [phone] Fax number [phone]

## 2021-08-29 NOTE — Patient Outreach (Signed)
[facility]) Care Management  08/29/2021  Mitra Duling 1946-04-08 [id number]   [facility] Care coordination-collaboration with community service provider  Incoming call from Mr [place], [facility] He confirms he has outreached to Mr Sedan after he spoke with RN CM and on today. He states voice messages left and he is pending outreaches back. He and RN CM confirmed the correct outreached number as [phone]. He and RN CM discussed the needed list of medications with times and frequency that have not yet been reconciled and [name] not being able to offer the name of the medicines [facility]   Plan [facility] RN CM will follow up within the next 30 business days if [name] does not return a call to RN CM as he stated within the next few days   Riyanshi Wahab L. [name], RN, BSN, CCM [facility] Telephonic Care Management Care Coordinator Office number [phone] Mobile number [phone]  Main [facility] number [phone] Fax number [phone]

## 2021-09-12 ENCOUNTER — Other Ambulatory Visit: Payer: Self-pay

## 2021-09-12 ENCOUNTER — Other Ambulatory Visit: Payer: Self-pay | Admitting: *Deleted

## 2021-09-12 NOTE — Patient Outreach (Signed)
[facility]) Care Management  09/12/2021  Caitlin Harrington 03/27/1946 [id number]   [facility] outreach to complex care patient    Ms Caitlin Harrington (also known by the last name Caitlin Harrington) was referred to [facility] on 05/28/21 by [facility] hospital liaison for post hospital/complex care services after admission for stroke and Diabetes Ms Caitlin Harrington was previously active with [facility] from late 2021 to 12/27/20 (unable to maintain contact with her) and unsuccessful [facility] engagement attempts in July 2021     Insurance blue cross and blue shield medicare   Last admission ([facility])  7/3-/75/22  acute metabolic encephalopathy, UTI, dehydration,  acute on CKD 3b, Hypoglycemia 6/11-15/22 multifocal ischemic stroke, DM 2 HgA1c 10.7, high chol/LDL     Outreached to the preferred listed number on EPIC [phone]  [facility] Unsuccessful outreach  No answer. [facility] RN CM left HIPAA [facility] Portability and Accountability Act) compliant voicemail message along with CM's contact info.   Plan: [facility] RN CM scheduled this patient for another call attempt within 4-7 business days Unsuccessful outreach on 09/12/21   Denelda Akerley L. [name], RN, BSN, CCM [facility] Telephonic Care Management Care Coordinator Office number [phone] Mobile number [phone]  Main [facility] number [phone] Fax number [phone]

## 2021-09-12 NOTE — Patient Outreach (Signed)
[facility]) Care Management  09/12/2021  Adalyne Lovick [birth year] [id number]   [facility] incoming call from [facility] complex care patient  Ms Marshe Shrestha (also known by the last name Sivertsen) was referred to [facility] on 05/28/21 by [facility] hospital liaison for post hospital/complex care services after admission for stroke and Diabetes Ms [name] was previously active with [facility] from late 2021 to 12/27/20 (unable to maintain contact with her) and unsuccessful [facility] engagement attempts in July 2021     Insurance blue cross and blue shield medicare   Last admission (cone)  7/3-/75/22  acute metabolic encephalopathy, UTI, dehydration,  acute on CKD 3b, Hypoglycemia 6/11-15/22 multifocal ischemic stroke, DM 2 HgA1c 10.7, high chol/LDL    Mr [facility] returned a call to RN CM He is able to verify HIPAA identifiers    Assessment [name] reports he did receive an outreach from Mr [place] after RN CM spoke with [name] [name] reports he provided all Mrs Timothy's medicines and information needed to assist [name]   [name] interested in meeting with primary care provider (PCP)' pharmacy staff during the patient's next follow up visit on 09/26/21 to review He is wanting mail order services after the best coverage is determined   Medically he reports she is doing very well and is not having any worsening symptoms to report  Patient Active Problem List   Diagnosis Date Noted   CKD (chronic kidney disease) stage 3, GFR 30-59 ml/min (HCC) 05/26/2021   Uncontrolled type 2 diabetes mellitus with hyperglycemia, with long-term current use of insulin (HCC) 05/26/2021   Unsteady gait    CVA (cerebral vascular accident) (HCC) 05/04/2021   Acute CVA (cerebrovascular accident) (HCC) 05/04/2021   Pain in joint involving ankle and foot 11/06/2020   Acute renal insufficiency 11/06/2020   Allergic rhinitis 11/06/2020   Arthropathy 11/06/2020   Benign essential hypertension 11/06/2020   Bilateral  carpal tunnel syndrome 11/06/2020   Chronic sinusitis 11/06/2020   Diverticulitis of colon 11/06/2020   Eczema 11/06/2020   Headache 11/06/2020   Long term (current) use of insulin (HCC) 11/06/2020   Acute metabolic encephalopathy 11/06/2020   Noncompliance with treatment 11/06/2020   Obstructive sleep apnea syndrome 11/06/2020   Other specified abnormal findings of blood chemistry 11/06/2020   Paresthesias 11/06/2020   Personal history of transient ischemic attack (TIA), and cerebral infarction without residual deficits 11/06/2020   Rash 11/06/2020   Depression 11/06/2020   Skin sensation disturbance 11/06/2020   Subclinical hypothyroidism 11/06/2020   Tick bite 11/06/2020   Tiredness 11/06/2020   Unspecified abnormal finding in specimens from other organs, systems and tissues 11/06/2020   Cerebral thrombosis with cerebral infarction 10/29/2020   Major depressive disorder, recurrent episode, moderate (HCC) 10/27/2020   Allergic reaction 10/26/2020   AKI (acute kidney injury) (HCC) 10/26/2020   Thrush 10/26/2020   Urinary tract infection without hematuria 05/12/2020   Encephalopathy 05/12/2020   Altered mental status    Hyperglycemia    Acute on chronic respiratory failure with hypoxia (HCC) 01/03/2020   COVID-19 12/30/2019   Hyperuricemia    Hypertension 02/18/2011   Prolonged depressive adjustment reaction 02/18/2011   Obesity 02/18/2011   Asthma, mild persistent 02/18/2011   Vitamin D deficiency 02/18/2011   Hyperlipidemia, unspecified 02/18/2011   Diabetes mellitus type 2, controlled, without complications (HCC) 02/18/2011   Insomnia 02/18/2011   Stress at work 02/18/2011     [facility] progression: RN CM offered to allow [name] to provide a date  and time for the next outreach He is undecided and discussed he has no job conflict at this time today with the date RN CM offers for follow up  [facility] RN CM intervention RN CM outreached to Dr Modesto Charon office, spoke with Babette Relic  Transferred to [employer] but reach [facility] staff who transferred to [facility] team member Kia. Christian not available today Kia clinical pharmacist assist took a message to leave for Christian to inquire if she would be willing to meet with Mrs Lawther and [name] on 09/26/21 during the office visit with Dr Modesto Charon  RN CM left RN CM office number for any questions   Plans Patient/Mr Muncon agrees to care plan and follow up within the next 30 business days   Goals Addressed               This Visit's Progress     Patient Stated     Find Help in My Community [facility]) (pt-stated)   On track     Timeframe:  Short-Term Goal Priority:  Medium Start Date:                            07/29/21 Expected End Date:      10/22/21                 Follow Up Date 10/14/21 Barriers: Knowledge     - follow-up on any referrals for help I am given - have a back-up plan     Notes:  09/12/21 spoke with [employer] staff to initiate process. Pending pcp visit  08/29/21 no outreach with [name] completed  08/15/21 Significant other agree to outreach to [employer] staff [name] for appointment and assistance        Manage My Medicine [facility]) (pt-stated)   On track     Timeframe:  Short-Term Goal Priority:  High Start Date:              06/18/21               Expected End Date:         10/22/21              Follow Up Date 10/14/21  Barriers: Knowledge    - keep a list of all the medicines I take; vitamins and herbals too  -consult with office pharmacy staff -Find help in community   Why is this important?   These steps will help you keep on track with your medicines.   Notes:  09/12/21 has spoken with [facility] [employer] staff, provided a list of medicines, purchased all month's medicines, want consult with pcp office pharmacy in November 2022 and later mail order services  08/29/21 Partner continues with voiced concern about medication management[facility] 08/15/21 taking medicines independently with prompting from significant other. Significant other agree to outreach to [employer] staff [name] for appointment and assistance  07/29/21 continue voice diabetic medication and supply concerns Seen by pcp pharmacy. Agreed to have RN Cm consult with pcp pharmacy staff and assist with outreach to [facility] 07/02/21 Caregiver and hired aide reported to be managing medicines All medicines purchased. Caregiver requested [facility] pharmacy referral be cancelled 06/18/21 reports cost and adherence concerns, Significant other and [facility] RN assist with filling pill containers. On a fixed income Paid >$900 for 90 day supply of medications for July 2022  Monitor and Manage My Blood Sugar-Diabetes Type 2 (THN) (pt-stated)   On track     Timeframe:  Long-Range Goal Priority:  High Start Date:                            07/09/21 Expected End Date:                     11/31/22  Follow Up Date 10/14/21  Barriers: Knowledge    - check blood sugar at prescribed times - take the blood sugar meter to all doctor visits    Why is this important?   Checking your blood sugar at home helps to keep it from getting very high or very low.  Writing the results in a diary or log helps the doctor know how to care for you.  Your blood sugar log should have the time, date and the results.  Also, write down the amount of insulin or other medicine that you take.  Other information, like what you ate, exercise done and how you were feeling, will also be helpful.     Notes:  09/12/21 doing well no voiced concerns today 08/29/21 continues use of dexcom. No voiced concerns Managing well 07/29/21 Continue use of Dexcom. Dexcom was taken to 07/11/21 pcp appointment during the consultation with pcp pharmacy staff  07/02/21 caregiver reports need assistance with education on continuous glucose monitor        Elizeth Weinrich L. [name], RN, BSN, CCM [facility] Telephonic Care Management  Care Coordinator Office number [phone] Main [facility] number [phone] Fax number [phone]

## 2021-09-26 DIAGNOSIS — E1165 Type 2 diabetes mellitus with hyperglycemia: Secondary | ICD-10-CM | POA: Diagnosis not present

## 2021-09-26 DIAGNOSIS — Z2009 Contact with and (suspected) exposure to other intestinal infectious diseases: Secondary | ICD-10-CM | POA: Diagnosis not present

## 2021-09-26 DIAGNOSIS — N1831 Chronic kidney disease, stage 3a: Secondary | ICD-10-CM | POA: Diagnosis not present

## 2021-09-26 DIAGNOSIS — E1169 Type 2 diabetes mellitus with other specified complication: Secondary | ICD-10-CM | POA: Diagnosis not present

## 2021-09-26 DIAGNOSIS — Z23 Encounter for immunization: Secondary | ICD-10-CM | POA: Diagnosis not present

## 2021-09-26 DIAGNOSIS — E785 Hyperlipidemia, unspecified: Secondary | ICD-10-CM | POA: Diagnosis not present

## 2021-09-26 DIAGNOSIS — G4700 Insomnia, unspecified: Secondary | ICD-10-CM | POA: Diagnosis not present

## 2021-09-26 DIAGNOSIS — F329 Major depressive disorder, single episode, unspecified: Secondary | ICD-10-CM | POA: Diagnosis not present

## 2021-09-26 DIAGNOSIS — I10 Essential (primary) hypertension: Secondary | ICD-10-CM | POA: Diagnosis not present

## 2021-09-26 DIAGNOSIS — Z8673 Personal history of transient ischemic attack (TIA), and cerebral infarction without residual deficits: Secondary | ICD-10-CM | POA: Diagnosis not present

## 2021-09-26 DIAGNOSIS — E039 Hypothyroidism, unspecified: Secondary | ICD-10-CM | POA: Diagnosis not present

## 2021-09-26 DIAGNOSIS — M79652 Pain in left thigh: Secondary | ICD-10-CM | POA: Diagnosis not present

## 2021-10-02 ENCOUNTER — Other Ambulatory Visit: Payer: Self-pay | Admitting: Family Medicine

## 2021-10-02 ENCOUNTER — Ambulatory Visit
Admission: RE | Admit: 2021-10-02 | Discharge: 2021-10-02 | Disposition: A | Discharge status: Home / Self Care | Payer: Medicare Other | Source: Ambulatory Visit | Attending: Family Medicine | Admitting: Family Medicine

## 2021-10-02 DIAGNOSIS — M79652 Pain in left thigh: Secondary | ICD-10-CM

## 2021-10-14 ENCOUNTER — Other Ambulatory Visit: Payer: Self-pay | Admitting: *Deleted

## 2021-10-14 NOTE — Patient Outreach (Signed)
[facility]) Care Management  10/14/2021  Caitlin Harrington 01-29-1946 [id number]   [facility] Unsuccessful outreach   Outreach attempt to the listed at the preferred outreach number in EPIC [phone] This is  change from last month This voice mail box is for Caitlin Harrington and it is full Outreach to Mr Alfonse Ras number  No answer. [facility] RN CM left HIPAA [facility] Portability and Accountability Act) compliant voicemail message along with CM's contact info.   Plan: [facility] RN CM scheduled this patient for another call attempt within 4-7 business days Unsuccessful outreach on 10/14/21   Cybil Senegal L. [name], RN, BSN, CCM [facility] Telephonic Care Management Care Coordinator Office number [phone] Mobile number [phone]  Main [facility] number [phone] Fax number [phone]

## 2021-10-22 ENCOUNTER — Other Ambulatory Visit: Payer: Self-pay

## 2021-10-22 ENCOUNTER — Other Ambulatory Visit: Payer: Self-pay | Admitting: *Deleted

## 2021-10-22 NOTE — Patient Outreach (Signed)
[facility]) Care Management Telephonic RN Care Manager Note   10/22/2021 Name:  Caitlin Harrington MRN:  [id number] DOB:  [birth year]  Summary: Follow up outreach to patient and significant other Caitlin Harrington Patient continues to remain well  Reports pending changes for medicare enrollment and mail order services  Completed recent primary care office visit but did not have a visit with office pharmacist as RN CM had worked on coordinating during the last successful outreach Mr Elenore Rota voices he is aware he may not need to visit with pcp pharmacist until the medicare enrollment process is completed and a plan is found to cover patient's present medication costs- pending an update from [place]   Recommendations/Changes made from today's visit: Encouragement provided to follow up with [facility] staff on medicare enrollment changes   Subjective: Caitlin Harrington is an 75 y.o. year old female who is a primary patient of [name], [name], MD. The care management team was consulted for assistance with care management and/or care coordination needs.    Telephonic RN Care Manager completed Telephone Visit today.   Objective:  Medications Reviewed Today     Reviewed by Penni Bombard, MD (Physician) on 08/26/21 at [place] List Status: <None>   Medication Order Taking? Sig Documenting Provider Last Dose Status Informant  albuterol (VENTOLIN HFA) 108 (90 Base) MCG/ACT inhaler [id number] No Inhale 2 puffs into the lungs 2 (two) times daily as needed for shortness of breath or wheezing.  Patient not taking: Reported on 08/25/2021   [provider] Not Taking Active Family Member  allopurinol (ZYLOPRIM) 300 MG tablet [id number] No Take 300 mg by mouth daily.  Patient not taking: Reported on 08/25/2021   [provider] Not Taking Active Family Member           Med Note [name] May 25, 2021  8:01 PM)    amLODipine (NORVASC) 5 MG  tablet [id number] Yes Take 1 tablet (5 mg total) by mouth daily. [name], MD Taking Active   aspirin EC 81 MG EC tablet [id number] Yes Take 1 tablet (81 mg total) by mouth daily. Swallow whole. [name], MD Taking Active Family Member  blood glucose meter kit and supplies KIT [id number] Yes Dispense based on patient and insurance preference. Use up to four times daily as directed. (FOR ICD-9 250.00, 250.01). [name], MD Taking Active Family Member  Blood Pressure Monitoring (BLOOD PRESSURE KIT) KIT [id number] Yes See admin instructions. [provider] Taking Active Family Member  carvedilol (COREG) 6.25 MG tablet [id number] Yes Take 6.25 mg by mouth 2 (two) times daily with a meal. [provider] Taking Active Family Member           Med Note [name] May 25, 2021  7:53 PM)    clopidogrel (PLAVIX) 75 MG tablet [id number] Yes Take 1 tablet (75 mg total) by mouth daily. [name], MD Taking Active   EUTHYROX 25 MCG tablet [id number] Yes Take 25 mcg by mouth daily. [provider] Taking Active Family Member  hydrOXYzine (ATARAX/VISTARIL) 25 MG tablet [id number] Yes Take 25 mg by mouth daily as needed for anxiety. [provider] Taking Active Family Member           Med Note [name] Aug 25, 2021 10:55 AM) 08/25/21 taking one tab at bedtime  insulin glargine (LANTUS) 100 UNIT/ML Solostar Pen [id number] Yes Inject 15  Units into the skin daily. [provider] Taking Active Family Member           Med Note [name]   Wed Jul 30, 2021 12:29 AM) 07/29/21 pcp office pharmacy reported pt lantus increased to 20 unit on 07/11/21 office visit   Insulin Pen Needle (NOVOFINE) 30G X 8 MM MISC [id number] Yes Inject 10 each into the skin as directed. [name], MD Taking Active Family Member  losartan (COZAAR) 25 MG tablet [id number] Yes Take 1 tablet (25 mg total) by mouth daily. [name], MD Taking Active   metFORMIN  (GLUCOPHAGE) 500 MG tablet [id number] Yes Take 500 mg by mouth daily. [provider] Taking Active Family Member  PARoxetine (PAXIL) 40 MG tablet [id number] No Take 40 mg by mouth every morning.  Patient not taking: Reported on 08/25/2021   [provider] Not Taking Active Family Member  rosuvastatin (CRESTOR) 20 MG tablet [id number] Yes Take 1 tablet (20 mg total) by mouth daily. Pokhrel, [name], MD Taking Active Family Member  sertraline (ZOLOFT) 50 MG tablet [id number] Yes Take 50 mg by mouth daily. [provider] Taking Active   sitaGLIPtin (JANUVIA) 100 MG tablet [id number] Yes Take 100 mg by mouth daily. [provider] Taking Active Family Member           Med Note [name] May 25, 2021  7:44 PM)    terazosin (HYTRIN) 5 MG capsule [id number] Yes Take 5 mg by mouth daily. [provider] Taking Active Family Member  UNABLE TO FIND [id number] Yes 1 tablet daily. Med Name: centrum gummies [provider] Taking Active   Med List Note [name] 05/05/21 7494): [id number]- donald             SDOH:  (Social Determinants of Health) assessments and interventions performed:    Care Plan  Review of patient past medical history, allergies, medications, health status, including review of consultants reports, laboratory and other test data, was performed as part of comprehensive evaluation for care management services.   Care Plan : RN Care Manager Plan of Care  Updates made by [name], RN since 10/22/2021 12:00 AM     Problem: Complex Care Coordination Needs and disease management in patient with DM   Priority: High     Long-Range Goal: Establish Plan of Care for Management Complex SDOH Barriers, disease management and Care Coordination Needs in patient with DM   Start Date: 10/22/2021  Priority: High  Note:   Current Barriers:  Knowledge Deficits related to plan of care for management of DMII   Care Coordination needs related to Lacks knowledge of community resource: medication management, medicare enrollment annual changes, pcp pharmacist  RNCM Clinical Goal(s):  Patient will verbalize understanding of plan for management of DMII as evidenced by improved HgA1c/home management  through collaboration with RN Care manager, provider, and care team.   Interventions: Continue follow up outreaches to assess changes of any care coordination or disease management/education need Inter-disciplinary care team collaboration (see longitudinal plan of care) Evaluation of current treatment plan related to  self management and patient's adherence to plan as established by provider   Diabetes Interventions:  (Status:  Goal on track:  Yes.) Long Term Goal Assessed patient's understanding of A1c goal: <7% Reviewed medications with patient and discussed importance of medication adherence Follow up on patient recent pcp visit with anticipated meeting with pcp pharmacist and medicare enrollment changes Messaged Mr Lenna Sciara  Shell [phone] for an possible update on any medicare active enrollment changes for patient Lab Results  Component Value Date   HGBA1C 10.7 (H) 05/03/2021   Patient Goals/Self-Care Activities: Take all medications as prescribed Attend all scheduled provider appointments Call pharmacy for medication refills 3-7 days in advance of running out of medications Perform all self care activities independently  Perform IADL's (shopping, preparing meals, housekeeping, managing finances) independently Follow up with medicare enrollment staff from [place] and pcp office pharmacy staff for medication management   Follow Up Plan:  The patient has been provided with contact information for the care management team and has been advised to call with any health related questions or concerns.  The care management team will reach out to the patient again over the next 30 business   days.         Plan: The patient has been provided with contact information for the care management team and has been advised to call with any health related questions or concerns.  The care management team will reach out to the patient again over the next 30 business days.  Candiace West L. Lavina Hamman, RN, BSN, [place] Coordinator Office number [phone] Main [facility] number [phone] Fax number [phone]

## 2021-10-28 ENCOUNTER — Other Ambulatory Visit: Payer: Self-pay | Admitting: *Deleted

## 2021-10-28 NOTE — Patient Outreach (Signed)
[facility] Caitlin Harrington) Care Management  10/28/2021  Caitlin Harrington Feb 07, 1946 [id number]   [facility] Unsuccessful outreach  Interaction via text with [name] on 10/24/21 1043 He stated pt is on a BCBS federal health care retiree plan that he does not recommend she switch from. RN CM voiced understanding and updated [name] of an attempt to get [name] to understand that working within this BCBS plan is an option but [name] preferred [name]'s recommendation  Outreach attempt to the listed at the preferred outreach number in EPIC  No answer. [facility] RN CM left HIPAA [facility] Portability and Accountability Act) compliant voicemail message along with CM's contact info.   Plan: [facility] RN CM scheduled this patient for another call attempt within 4-7 business days Unsuccessful outreach on 10/28/21   Yussef Jorge L. [name], RN, BSN, CCM [facility] Telephonic Care Management Care Coordinator Office number [phone] Mobile number [phone]  Main [facility] number [phone] Fax number [phone]

## 2021-10-31 ENCOUNTER — Other Ambulatory Visit: Payer: Self-pay | Admitting: *Deleted

## 2021-10-31 NOTE — Patient Outreach (Signed)
[facility]) Care Management  10/31/2021  Caitlin Harrington 1945-12-08 [id number]   [facility] second Unsuccessful outreach      Outreach attempt to [phone] with an attempt to reach [name] and the patient to follow up on active enrollment and pharmacy needs  No answer. [facility] RN CM left HIPAA [facility] Portability and Accountability Act) compliant voicemail message along with CM's contact info.   Plan: [name] RN CM scheduled this patient for another call attempt within 4-7 business days Unsuccessful outreach on 10/28/21, 10/31/21 Unsuccessful letter sent 10/31/21     [name] L. [name], RN, BSN, CCM [facility] Telephonic Care Management Care Coordinator Office number [phone] Mobile number [phone]  Main [facility] number [phone] Fax number [phone]

## 2021-11-03 ENCOUNTER — Other Ambulatory Visit: Payer: Self-pay | Admitting: *Deleted

## 2021-11-03 ENCOUNTER — Other Ambulatory Visit: Payer: Self-pay

## 2021-11-03 DIAGNOSIS — Z2009 Contact with and (suspected) exposure to other intestinal infectious diseases: Secondary | ICD-10-CM | POA: Insufficient documentation

## 2021-11-03 DIAGNOSIS — Z639 Problem related to primary support group, unspecified: Secondary | ICD-10-CM | POA: Insufficient documentation

## 2021-11-03 DIAGNOSIS — R413 Other amnesia: Secondary | ICD-10-CM | POA: Insufficient documentation

## 2021-11-03 DIAGNOSIS — E1165 Type 2 diabetes mellitus with hyperglycemia: Secondary | ICD-10-CM | POA: Insufficient documentation

## 2021-11-03 DIAGNOSIS — I700 Atherosclerosis of aorta: Secondary | ICD-10-CM | POA: Insufficient documentation

## 2021-11-03 NOTE — Patient Outreach (Signed)
[facility]) Care Management Telephonic RN Care Manager Note   12/10/2021 Name:  Caitlin Harrington MRN:  [id number] DOB:  May 27, 1946  Summary: Follow outreach  See below care plan  Subjective: Caitlin Harrington is an 75 y.o. year old female who is a primary patient of [name] [name], MD. The care management team was consulted for assistance with care management and/or care coordination needs.    Telephonic RN Care Manager completed Telephone Visit today.   Objective:  Medications Reviewed Today     Reviewed by [name], RN (Registered Nurse) on 11/03/21 at 65  Med List Status: <None>   Medication Order Taking? Sig Documenting Provider Last Dose Status Informant  albuterol (VENTOLIN HFA) 108 (90 Base) MCG/ACT inhaler [id number] No Inhale 2 puffs into the lungs 2 (two) times daily as needed for shortness of breath or wheezing.  Patient not taking: Reported on 08/25/2021   [provider] Not Taking Active Family Member  allopurinol (ZYLOPRIM) 300 MG tablet [id number] No Take 300 mg by mouth daily.  Patient not taking: Reported on 08/25/2021   [provider] Not Taking Active Family Member           Med Note [name] May 25, 2021  8:01 PM)    amLODipine (NORVASC) 5 MG tablet [id number] No Take 1 tablet (5 mg total) by mouth daily. [name], MD Taking Active   aspirin EC 81 MG EC tablet [id number] No Take 1 tablet (81 mg total) by mouth daily. Swallow whole. [name], MD Taking Active Family Member  blood glucose meter kit and supplies KIT [id number] No Dispense based on patient and insurance preference. Use up to four times daily as directed. (FOR ICD-9 250.00, 250.01). [name], MD Taking Active Family Member  Blood Pressure Monitoring (BLOOD PRESSURE KIT) KIT [id number] No See admin instructions. [provider] Taking Active Family Member  carvedilol (COREG) 6.25 MG tablet [id number] No Take 6.25 mg by mouth 2  (two) times daily with a meal. [provider] Taking Active Family Member           Med Note [name] May 25, 2021  7:53 PM)    clopidogrel (PLAVIX) 75 MG tablet [id number] No Take 1 tablet (75 mg total) by mouth daily. [name], MD Taking Active   EUTHYROX 25 MCG tablet [id number] No Take 25 mcg by mouth daily. [provider] Taking Active Family Member  hydrOXYzine (ATARAX/VISTARIL) 25 MG tablet [id number] No Take 25 mg by mouth daily as needed for anxiety. [provider] Taking Active Family Member           Med Note [name] Aug 25, 2021 10:55 AM) 08/25/21 taking one tab at bedtime  insulin glargine (LANTUS) 100 UNIT/ML Solostar Pen [id number] No Inject 15 Units into the skin daily. [provider] Taking Active Family Member           Med Note [name]   Wed Jul 30, 2021 12:29 AM) 07/29/21 pcp office pharmacy reported pt lantus increased to 20 unit on 07/11/21 office visit   Insulin Pen Needle (NOVOFINE) 30G X 8 MM MISC [id number] No Inject 10 each into the skin as directed. [name], MD Taking Active Family Member  losartan (COZAAR) 25 MG tablet [id number] No Take 1 tablet (25 mg total) by mouth daily. [name], MD Taking Active   metFORMIN (GLUCOPHAGE) 500 MG tablet [id number]  No Take 500 mg by mouth daily. [provider] Taking Active Family Member  PARoxetine (PAXIL) 40 MG tablet [id number] No Take 40 mg by mouth every morning.  Patient not taking: Reported on 08/25/2021   [provider] Not Taking Active Family Member  rosuvastatin (CRESTOR) 20 MG tablet [id number] No Take 1 tablet (20 mg total) by mouth daily. Pokhrel, [name], MD Taking Active Family Member  sertraline (ZOLOFT) 50 MG tablet [id number] No Take 50 mg by mouth daily. [provider] Taking Active   sitaGLIPtin (JANUVIA) 100 MG tablet [id number] No Take 100 mg by mouth daily. [provider] Taking Active Family  Member           Med Note [name] May 25, 2021  7:44 PM)    terazosin (HYTRIN) 5 MG capsule [id number] No Take 5 mg by mouth daily. [provider] Taking Active Family Member  UNABLE TO FIND [id number] No 1 tablet daily. Med Name: centrum gummies [provider] Taking Active   Med List Note [name] 05/05/21 3428): [id number]- donald             SDOH:  (Social Determinants of Health) assessments and interventions performed:    Care Plan  Review of patient past medical history, allergies, medications, health status, including review of consultants reports, laboratory and other test data, was performed as part of comprehensive evaluation for care management services.   Care Plan : RN Care Manager Plan of Care  Updates made by [name], RN since 12/10/2021 12:00 AM     Problem: Complex Care Coordination Needs and disease management in patient with DM   Priority: High     Long-Range Goal: Establish Plan of Care for Management Complex SDOH Barriers, disease management and Care Coordination Needs in patient with DM   Start Date: 10/22/2021  This Visit's Progress: On track  Priority: High  Note:   Current Barriers:  Knowledge Deficits related to plan of care for management of DMII  Care Coordination needs related to Lacks knowledge of community resource: medication management, medicare enrollment annual changes, pcp pharmacist Pt lives with Mr Gigi Gin who assists with the management of her iADLs to include obtaining her medicines and medical supplies  RN CM Clinical Goal(s):  Patient/caregiver will verbalize understanding of plan for management of DMII as evidenced by improved HgA1c/home management  through collaboration with RN Care manager, provider, and care team.   Interventions: Continue follow up outreaches to assess changes of any care coordination or disease management/education need Inter-disciplinary care team  collaboration (see longitudinal plan of care) Evaluation of current treatment plan related to  self management and patient's adherence to plan as established by provider   Diabetes Interventions:  (Status:  Goal on track:  Yes.) Long Term Goal Assessed patient's understanding of A1c goal: <7% Reviewed medications with patient and discussed importance of medication adherence Follow up on patient recent pcp visit with anticipated meeting with pcp pharmacist and medicare enrollment changes Messaged Mr Ree Shay [phone] for an possible update on any medicare active enrollment changes for patient 11/03/21 reviewed with Mr [name] the outreach from Mr Minta Balsam and attempted outreach to [employer] office pharmacy, [facility] staff with voice message left Allowed him time to ventilate his feelings  Outreached as a conference call to [place] program [phone] Allowed pt/caregiver to ask all questions and ventilate to [facility] pharmacy staff member, Alvester Chou briefly with Ms [place] [employer]  to pcp office, spoke with Corey Skains to leave a message for pcp nurse related to request for assist with new prescriptions for FEP mail order services, as many 3 month supply medicines and diabetic supplies as possible Left fax number [phone], call in number [phone] and electronic -[facility] mail order info Incoming call from [place] pharmacist to review pt request for mail order. RN CM answered pharmacist's questions. Updated Mr [name] via Text as requested that the pcp office staff had responded and is sending the prescriptions  Recommendations/Changes made from today's visit: Change patient to mail order services via her insurance FEP pharmacy benefit Outreach to pcp office to follow up on request for new prescriptions for mail order services provided mail order service number [phone] fax [phone]  Lab Results  Component Value Date   HGBA1C 10.7 (H) 05/03/2021   Patient Goals/Self-Care  Activities: Take all medications as prescribed Attend all scheduled provider appointments Call pharmacy for medication refills 3-7 days in advance of running out of medications Perform all self care activities independently  Perform IADL's (shopping, preparing meals, housekeeping, managing finances) independently Follow up with medicare enrollment staff from [place] and pcp office pharmacy staff for medication management   Follow Up Plan:  The patient has been provided with contact information for the care management team and has been advised to call with any health related questions or concerns.  The care management team will reach out to the patient again over the next 30 business  days.       Plan: The patient has been provided with contact information for the care management team and has been advised to call with any health related questions or concerns.  The care management team will reach out to the patient again over the next 30 business  days.  Dariyon Urquilla L. Lavina Hamman, RN, BSN, [place] Coordinator Office number [phone] Main [facility] number [phone] Fax number [phone]

## 2021-12-04 ENCOUNTER — Other Ambulatory Visit: Payer: Self-pay | Admitting: *Deleted

## 2021-12-04 NOTE — Patient Outreach (Signed)
[facility]) Care Management  12/04/2021  Caitlin Harrington 01/21/1946 [id number]   [facility] Unsuccessful outreach   Outreach attempt to the listed at the preferred outreach number in EPIC [phone] No answer. [facility] RN CM left HIPAA [facility] Portability and Accountability Act) compliant voicemail message along with CMs contact info.   Plan: [facility] RN CM scheduled this patient for another call attempt within 4-7 business days Unsuccessful outreach on 12/04/21   [name] L. [name], RN, BSN, CCM [facility] Telephonic Care Management Care Coordinator Office number [phone] Mobile number [phone]  Main [facility] number [phone] Fax number [phone]

## 2021-12-10 ENCOUNTER — Other Ambulatory Visit: Payer: Self-pay | Admitting: *Deleted

## 2021-12-10 NOTE — Patient Outreach (Signed)
[facility]) Care Management  12/10/2021  Caitlin Harrington 1946-03-08 [id number]   [facility] Unsuccessful outreach #2     Outreach attempt to the listed at the preferred outreach number in EPIC [phone] No answer. [facility] RN CM left HIPAA [facility] Portability and Accountability Act) compliant voicemail message along with CMs contact info.  Requested [name] let RN CM know a good day and time to outreach after a message was received from 12/04/21 stating he had just awaken after working as a [employer] and he would only be available for limited time   Plan: [facility] RN CM scheduled this patient for another call attempt within 4-7+ business days Unsuccessful outreach on 12/04/21, 12/10/21 Unsuccessful outreach letter mailed 12/10/21     [name] L. [name], RN, BSN, CCM [facility] Telephonic Care Management Care Coordinator Office number [phone] Mobile number [phone]  Main [facility] number [phone] Fax number [phone]

## 2021-12-16 ENCOUNTER — Other Ambulatory Visit: Payer: Self-pay | Admitting: *Deleted

## 2021-12-16 ENCOUNTER — Other Ambulatory Visit: Payer: Self-pay

## 2021-12-16 NOTE — Patient Outreach (Signed)
[facility]) Care Management Telephonic RN Care Manager Note   12/16/2021 Name:  FIANA GLADU MRN:  [id number] DOB:  1946/09/28  Summary: Follow up outreach  Reached Mr Laurene Footman for outpatient therapy He reports Mrs Smejkal is having a ongoing pain of left arm "muscular"  He inquire if she has a benefit to go to a [facility] outpatient therapy facility as suggested by one of the home health staff previously  He reports the last pcp visit was possibly in November 2022 with a recent xray completed Pain and weakness of muscle tone extends down her left side    Medication management -has improved, going well Very appreciative of mail order services   Diabetes is managed well at home   Recommendations/Changes made from today's visit: See care plan below   Subjective: GAYATRI TEASDALE is an 76 y.o. year old female who is a primary patient of [name], [name], MD. The care management team was consulted for assistance with care management and/or care coordination needs.    Telephonic RN Care Manager completed Telephone Visit today.   Objective:  Medications Reviewed Today     Reviewed by [name], RN (Registered Nurse) on 11/03/21 at 35  Med List Status: <None>   Medication Order Taking? Sig Documenting Provider Last Dose Status Informant  albuterol (VENTOLIN HFA) 108 (90 Base) MCG/ACT inhaler [id number] No Inhale 2 puffs into the lungs 2 (two) times daily as needed for shortness of breath or wheezing.  Patient not taking: Reported on 08/25/2021   [provider] Not Taking Active Family Member  allopurinol (ZYLOPRIM) 300 MG tablet [id number] No Take 300 mg by mouth daily.  Patient not taking: Reported on 08/25/2021   [provider] Not Taking Active Family Member           Med Note [name] May 25, 2021  8:01 PM)    amLODipine (NORVASC) 5 MG tablet [id number] No Take 1 tablet (5 mg total) by mouth daily. [name],  MD Taking Active   aspirin EC 81 MG EC tablet [id number] No Take 1 tablet (81 mg total) by mouth daily. Swallow whole. [name], MD Taking Active Family Member  blood glucose meter kit and supplies KIT [id number] No Dispense based on patient and insurance preference. Use up to four times daily as directed. (FOR ICD-9 250.00, 250.01). [name], MD Taking Active Family Member  Blood Pressure Monitoring (BLOOD PRESSURE KIT) KIT [id number] No See admin instructions. [provider] Taking Active Family Member  carvedilol (COREG) 6.25 MG tablet [id number] No Take 6.25 mg by mouth 2 (two) times daily with a meal. [provider] Taking Active Family Member           Med Note [name] May 25, 2021  7:53 PM)    clopidogrel (PLAVIX) 75 MG tablet [id number] No Take 1 tablet (75 mg total) by mouth daily. [name], MD Taking Active   EUTHYROX 25 MCG tablet [id number] No Take 25 mcg by mouth daily. [provider] Taking Active Family Member  hydrOXYzine (ATARAX/VISTARIL) 25 MG tablet [id number] No Take 25 mg by mouth daily as needed for anxiety. [provider] Taking Active Family Member           Med Note [name] Aug 25, 2021 10:55 AM) 08/25/21 taking one tab at bedtime  insulin glargine (LANTUS) 100 UNIT/ML Solostar Pen [id number] No Inject 15 Units  into the skin daily. [provider] Taking Active Family Member           Med Note [name]   Wed Jul 30, 2021 12:29 AM) 07/29/21 pcp office pharmacy reported pt lantus increased to 20 unit on 07/11/21 office visit   Insulin Pen Needle (NOVOFINE) 30G X 8 MM MISC [id number] No Inject 10 each into the skin as directed. [name], MD Taking Active Family Member  losartan (COZAAR) 25 MG tablet [id number] No Take 1 tablet (25 mg total) by mouth daily. [name], MD Taking Active   metFORMIN (GLUCOPHAGE) 500 MG tablet [id number] No Take 500 mg by mouth daily. [provider] Taking Active Family Member  PARoxetine (PAXIL) 40 MG tablet [id number] No Take 40 mg by mouth every morning.  Patient not taking: Reported on 08/25/2021   [provider] Not Taking Active Family Member  rosuvastatin (CRESTOR) 20 MG tablet [id number] No Take 1 tablet (20 mg total) by mouth daily. Pokhrel, [name], MD Taking Active Family Member  sertraline (ZOLOFT) 50 MG tablet [id number] No Take 50 mg by mouth daily. [provider] Taking Active   sitaGLIPtin (JANUVIA) 100 MG tablet [id number] No Take 100 mg by mouth daily. [provider] Taking Active Family Member           Med Note [name] May 25, 2021  7:44 PM)    terazosin (HYTRIN) 5 MG capsule [id number] No Take 5 mg by mouth daily. [provider] Taking Active Family Member  UNABLE TO FIND [id number] No 1 tablet daily. Med Name: centrum gummies [provider] Taking Active   Med List Note [name] 05/05/21 6195): [id number]           Patient Active Problem List   Diagnosis Date Noted   Exposure to intestinal infectious disease 11/03/2021   Hardening of the aorta (main artery of the heart) ([place]) 11/03/2021   Hyperglycemia due to type 2 diabetes mellitus ([place]) 11/03/2021   Memory impairment 11/03/2021   Relationship dysfunction 11/03/2021   Chronic kidney disease, stage 3a ([place]) 05/26/2021   Type 2 diabetes mellitus with other specified complication ([place]) 80/99/8338   Unsteady gait    CVA (cerebral vascular accident) ([place]) 05/04/2021   Acute CVA (cerebrovascular accident) ([place]) 05/04/2021   Pain in joint involving ankle and foot 11/06/2020   Acute renal insufficiency 11/06/2020   Allergic rhinitis 11/06/2020   Arthropathy 11/06/2020   Benign essential hypertension 11/06/2020   Bilateral carpal tunnel syndrome 11/06/2020   Chronic sinusitis 11/06/2020   Diverticulitis of colon 11/06/2020   Eczema 11/06/2020   Headache 11/06/2020   Long  term (current) use of insulin ([place]) 25/03/3975   Acute metabolic encephalopathy 73/41/9379   Noncompliance with treatment 11/06/2020   Obstructive sleep apnea syndrome 11/06/2020   Other specified abnormal findings of blood chemistry 11/06/2020   Paresthesias 11/06/2020   Personal history of transient ischemic attack (TIA), and cerebral infarction without residual deficits 11/06/2020   Rash 11/06/2020   Depression 11/06/2020   Skin sensation disturbance 11/06/2020   Subclinical hypothyroidism 11/06/2020   Tick bite 11/06/2020   Tiredness 11/06/2020   Unspecified abnormal finding in specimens from other organs, systems and tissues 11/06/2020   Cerebral thrombosis with cerebral infarction 10/29/2020   Major depressive disorder, recurrent episode, moderate ([place]) 10/27/2020   Allergic reaction 10/26/2020   AKI (acute kidney injury) ([place]) 10/26/2020   Thrush 10/26/2020   Urinary tract infection without hematuria  05/12/2020   Encephalopathy 05/12/2020   Altered mental status    Hyperglycemia    Acute on chronic respiratory failure with hypoxia ([place]) 01/03/2020   COVID-19 12/30/2019   Hyperuricemia    Hypertension 02/18/2011   Prolonged depressive adjustment reaction 02/18/2011   Obesity 02/18/2011   Asthma, mild persistent 02/18/2011   Vitamin D deficiency 02/18/2011   Hyperlipidemia, unspecified 02/18/2011   Diabetes mellitus type 2, controlled, without complications ([place]) 25/36/6440   Insomnia 02/18/2011   Stress at work 02/18/2011     SDOH:  (Social Determinants of Health) assessments and interventions performed:  SDOH Interventions    Flowsheet Row Most Recent Value  SDOH Interventions   Food Insecurity Interventions Intervention Not Indicated  Financial Strain Interventions Intervention Not Indicated  Housing Interventions Intervention Not Indicated  Intimate Partner Violence Interventions Intervention Not Indicated  Stress Interventions Intervention Not Indicated   Transportation Interventions Intervention Not Indicated       Care Plan  Review of patient past medical history, allergies, medications, health status, including review of consultants reports, laboratory and other test data, was performed as part of comprehensive evaluation for care management services.   Care Plan : RN Care Manager Plan of Care  Updates made by [name], RN since 12/16/2021 12:00 AM     Problem: Complex Care Coordination Needs and disease management in patient with DM   Priority: High     Long-Range Goal: Establish Plan of Care for Management Complex SDOH Barriers, disease management and Care Coordination Needs in patient with DM   Start Date: 10/22/2021  This Visit's Progress: On track  Recent Progress: On track  Priority: High  Note:   Current Barriers:  Knowledge Deficits related to plan of care for management of DMII  Care Coordination needs related to Lacks knowledge of community resource: medication management, medicare enrollment annual changes, pcp pharmacist Pt lives with Mr Gigi Gin who assists with the management of her iADLs to include obtaining her medicines and medical supplies  RN CM Clinical Goal(s):  Patient/caregiver will verbalize understanding of plan for management of DMII as evidenced by improved HgA1c/home management  through collaboration with RN Care manager, provider, and care team.   Interventions: Continue follow up outreaches to assess changes of any care coordination or disease management/education need Inter-disciplinary care team collaboration (see longitudinal plan of care) Evaluation of current treatment plan related to  self management and patient's adherence to plan as established by provider 12/16/21 Reviewed process of facility and outpatient therapy medicare guidelines and how to obtain community level orders for services Discussed patient may need to be returned to pcp for evaluation if symptoms have worsen since  last evaluation or to met medicare guidelines Assessed for any concerns with medications since mail order services initiated  Assessed home care of diabetes Outreach to primary care provider (PCP) office [phone] Thayer Headings about Patient interest in outpatient physical therapy who sent a note to pcp RN   Diabetes Interventions:  (Status:  Goal on track:  Yes.) Long Term Goal Assessed patient's understanding of A1c goal: <7% Reviewed medications with patient and discussed importance of medication adherence Follow up on patient recent pcp visit with anticipated meeting with pcp pharmacist and medicare enrollment changes Messaged Mr Ree Shay [phone] for an possible update on any medicare active enrollment changes for patient 11/03/21 reviewed with Mr [name] the outreach from Mr Minta Balsam and attempted outreach to [employer], [facility] staff with voice message left Allowed  him time to ventilate his feelings  Outreached as a conference call to [place] program [phone] Allowed pt/caregiver to ask all questions and ventilate to [facility] pharmacy staff member, Alvester Chou briefly with Ms Sharlon Pfohl to pcp office, spoke with Corey Skains to leave a message for pcp nurse related to request for assist with new prescriptions for FEP mail order services, as many 3 month supply medicines and diabetic supplies as possible Left fax number [phone], call in number [phone] and electronic -[facility] mail order info Incoming call from [place], [facility] pharmacist to review pt request for mail order. RN CM answered pharmacist's questions. Updated Mr [name] via Text as requested that the pcp office staff had responded and is sending the prescriptions 12/16/21 improved   Recommendations/Changes made from today's visit: Change patient to mail order services via her insurance FEP pharmacy benefit Outreach to pcp office to follow up on request for new prescriptions for mail order services  provided mail order service number [phone] fax [phone]  Lab Results  Component Value Date   HGBA1C 10.7 (H) 05/03/2021   Patient Goals/Self-Care Activities: Take all medications as prescribed Attend all scheduled provider appointments Call pharmacy for medication refills 3-7 days in advance of running out of medications Perform all self care activities independently  Perform IADL's (shopping, preparing meals, housekeeping, managing finances) independently Follow up with medicare enrollment staff from [place] and pcp office pharmacy staff for medication management   Follow Up Plan:  The patient has been provided with contact information for the care management team and has been advised to call with any health related questions or concerns.  The care management team will reach out to the patient again over the next 30+ business  days.       Plan: The patient has been provided with contact information for the care management team and has been advised to call with any health related questions or concerns.  The care management team will reach out to the patient again over the next 30+ business days.  Marriana Hibberd L. Lavina Hamman, RN, BSN, [place] Coordinator Office number [phone] [facility] number [phone] Fax number [phone]

## 2021-12-26 ENCOUNTER — Other Ambulatory Visit: Payer: Self-pay

## 2021-12-26 ENCOUNTER — Other Ambulatory Visit: Payer: Self-pay | Admitting: *Deleted

## 2021-12-26 NOTE — Patient Outreach (Addendum)
[facility]) Care Management Telephonic RN Care Manager Note   12/26/2021 Name:  Caitlin Harrington MRN:  [id number] DOB:  04-02-1946  Summary: Follow up outreach to Caitlin Harrington and Caitlin Harrington  Caitlin Harrington reports they are working on Caitlin Harrington outpatient therapy services He reports they are at a store at the time of the outreach    Recommendations/Changes made from today's visit: RN CM outreach to follow up RN CM encouraged Caitlin Harrington to return a call to RN CM    Subjective: Caitlin Harrington is an 76 y.o. year old female who is a primary patient of [name], [name], MD. The care management team was consulted for assistance with care management and/or care coordination needs.    Telephonic RN Care Manager completed Telephone Visit today.   Objective:  Medications Reviewed Today     Reviewed by [name], RN (Registered Nurse) on 12/26/21 at 49  Med List Status: <None>   Medication Order Taking? Sig Documenting Provider Last Dose Status Informant  albuterol (VENTOLIN HFA) 108 (90 Base) MCG/ACT inhaler [id number] No Inhale 2 puffs into the lungs 2 (two) times daily as needed for shortness of breath or wheezing.  Patient not taking: Reported on 08/25/2021   [provider] Not Taking Active Family Member  allopurinol (ZYLOPRIM) 300 MG tablet [id number] No Take 300 mg by mouth daily.  Patient not taking: Reported on 08/25/2021   [provider] Not Taking Active Family Member           Med Note [name] May 25, 2021  8:01 PM)    amLODipine (NORVASC) 5 MG tablet [id number] No Take 1 tablet (5 mg total) by mouth daily. [name], MD Taking Active   aspirin EC 81 MG EC tablet [id number] No Take 1 tablet (81 mg total) by mouth daily. Swallow whole. [name], MD Taking Active Family Member  blood glucose meter kit and supplies KIT [id number] No Dispense based on patient and insurance preference. Use up to four times daily as directed. (FOR  ICD-9 250.00, 250.01). [name], MD Taking Active Family Member  Blood Pressure Monitoring (BLOOD PRESSURE KIT) KIT [id number] No See admin instructions. [provider] Taking Active Family Member  carvedilol (COREG) 6.25 MG tablet [id number] No Take 6.25 mg by mouth 2 (two) times daily with a meal. [provider] Taking Active Family Member           Med Note [name] May 25, 2021  7:53 PM)    clopidogrel (PLAVIX) 75 MG tablet [id number] No Take 1 tablet (75 mg total) by mouth daily. [name], MD Taking Active   EUTHYROX 25 MCG tablet [id number] No Take 25 mcg by mouth daily. [provider] Taking Active Family Member  hydrOXYzine (ATARAX/VISTARIL) 25 MG tablet [id number] No Take 25 mg by mouth daily as needed for anxiety. [provider] Taking Active Family Member           Med Note [name] Aug 25, 2021 10:55 AM) 08/25/21 taking one tab at bedtime  insulin glargine (LANTUS) 100 UNIT/ML Solostar Pen [id number] No Inject 15 Units into the skin daily. [provider] Taking Active Family Member           Med Note [name]   Wed Jul 30, 2021 12:29 AM) 07/29/21 pcp office pharmacy reported pt lantus increased to 20 unit on 07/11/21 office visit  Insulin Pen Needle (NOVOFINE) 30G X 8 MM MISC [id number] No Inject 10 each into the skin as directed. [name], MD Taking Active Family Member  losartan (COZAAR) 25 MG tablet [id number] No Take 1 tablet (25 mg total) by mouth daily. [name], MD Taking Active   metFORMIN (GLUCOPHAGE) 500 MG tablet [id number] No Take 500 mg by mouth daily. [provider] Taking Active Family Member  PARoxetine (PAXIL) 40 MG tablet [id number] No Take 40 mg by mouth every morning.  Patient not taking: Reported on 08/25/2021   [provider] Not Taking Active Family Member  rosuvastatin (CRESTOR) 20 MG tablet [id number] No Take 1 tablet (20 mg total) by mouth daily.  Pokhrel, [name], MD Taking Active Family Member  sertraline (ZOLOFT) 50 MG tablet [id number] No Take 50 mg by mouth daily. [provider] Taking Active   sitaGLIPtin (JANUVIA) 100 MG tablet [id number] No Take 100 mg by mouth daily. [provider] Taking Active Family Member           Med Note [name] May 25, 2021  7:44 PM)    terazosin (HYTRIN) 5 MG capsule [id number] No Take 5 mg by mouth daily. [provider] Taking Active Family Member  UNABLE TO FIND [id number] No 1 tablet daily. Med Name: centrum gummies [provider] Taking Active   Med List Note [name] 05/05/21 9937): [id number]            Patient Active Problem List   Diagnosis Date Noted   Exposure to intestinal infectious disease 11/03/2021   Hardening of the aorta (main artery of the heart) ([place]) 11/03/2021   Hyperglycemia due to type 2 diabetes mellitus ([place]) 11/03/2021   Memory impairment 11/03/2021   Relationship dysfunction 11/03/2021   Chronic kidney disease, stage 3a ([place]) 05/26/2021   Type 2 diabetes mellitus with other specified complication ([place]) 01/75/1025   Unsteady gait    CVA (cerebral vascular accident) ([place]) 05/04/2021   Acute CVA (cerebrovascular accident) ([place]) 05/04/2021   Pain in joint involving ankle and foot 11/06/2020   Acute renal insufficiency 11/06/2020   Allergic rhinitis 11/06/2020   Arthropathy 11/06/2020   Benign essential hypertension 11/06/2020   Bilateral carpal tunnel syndrome 11/06/2020   Chronic sinusitis 11/06/2020   Diverticulitis of colon 11/06/2020   Eczema 11/06/2020   Headache 11/06/2020   Long term (current) use of insulin ([place]) 85/27/7824   Acute metabolic encephalopathy 23/53/6144   Noncompliance with treatment 11/06/2020   Obstructive sleep apnea syndrome 11/06/2020   Other specified abnormal findings of blood chemistry 11/06/2020   Paresthesias 11/06/2020   Personal history of transient  ischemic attack (TIA), and cerebral infarction without residual deficits 11/06/2020   Rash 11/06/2020   Depression 11/06/2020   Skin sensation disturbance 11/06/2020   Subclinical hypothyroidism 11/06/2020   Tick bite 11/06/2020   Tiredness 11/06/2020   Unspecified abnormal finding in specimens from other organs, systems and tissues 11/06/2020   Cerebral thrombosis with cerebral infarction 10/29/2020   Major depressive disorder, recurrent episode, moderate ([place]) 10/27/2020   Allergic reaction 10/26/2020   AKI (acute kidney injury) ([place]) 10/26/2020   Thrush 10/26/2020   Urinary tract infection without hematuria 05/12/2020   Encephalopathy 05/12/2020   Altered mental status    Hyperglycemia    Acute on chronic respiratory failure with hypoxia ([place]) 01/03/2020   COVID-19 12/30/2019   Hyperuricemia    Hypertension 02/18/2011   Prolonged depressive adjustment reaction 02/18/2011   Obesity 02/18/2011  Asthma, mild persistent 02/18/2011   Vitamin D deficiency 02/18/2011   Hyperlipidemia, unspecified 02/18/2011   Diabetes mellitus type 2, controlled, without complications ([place]) 82/80/0349   Insomnia 02/18/2011   Stress at work 02/18/2011    SDOH:  (Social Determinants of Health) assessments and interventions performed:  SDOH Interventions    Flowsheet Row Most Recent Value  SDOH Interventions   Food Insecurity Interventions Intervention Not Indicated  Financial Strain Interventions Intervention Not Indicated  Transportation Interventions Intervention Not Indicated       Care Plan  Review of patient past medical history, allergies, medications, health status, including review of consultants reports, laboratory and other test data, was performed as part of comprehensive evaluation for care management services.   Care Plan : RN Care Manager Plan of Care  Updates made by [name], RN since 12/26/2021 12:00 AM     Problem: Complex Care Coordination Needs and disease  management in patient with DM   Priority: High     Long-Range Goal: Establish Plan of Care for Management Complex SDOH Barriers, disease management and Care Coordination Needs in patient with DM   Start Date: 10/22/2021  This Visit's Progress: On track  Recent Progress: On track  Priority: High  Note:   Current Barriers:  Knowledge Deficits related to plan of care for management of DMII  Care Coordination needs related to Lacks knowledge of community resource: medication management, medicare enrollment annual changes, pcp pharmacist Pt lives with Caitlin Harrington who assists with the management of her iADLs to include obtaining her medicines and medical supplies  RN CM Clinical Goal(s):  Patient/caregiver will verbalize understanding of plan for management of DMII as evidenced by improved HgA1c/home management  through collaboration with RN Care manager, provider, and care team.   Interventions: Continue follow up outreaches to assess changes of any care coordination or disease management/education need Inter-disciplinary care team collaboration (see longitudinal plan of care) Evaluation of current treatment plan related to  self management and patient's adherence to plan as established by provider 12/16/21 Reviewed process of facility and outpatient therapy medicare guidelines and how to obtain community level orders for services Discussed patient may need to be returned to pcp for evaluation if symptoms have worsen since last evaluation or to met medicare guidelines Assessed for any concerns with medications since mail order services initiated  Assessed home care of diabetes Outreach to primary care provider (PCP) office [phone] Th[place] Headings about Patient interest in outpatient physical therapy who sent a note to pcp RN  12/26/21 Follow up to check on outpatient therapies Caitlin Harrington reports services are being worked on. They are at a store at the time of the outreach. Caitlin Harrington was  requested to return a call to RN CM   Diabetes Interventions:  (Status:  Condition stable.  Not addressed this visit.) Long Term Goal Assessed patient's understanding of A1c goal: <7% Reviewed medications with patient and discussed importance of medication adherence Follow up on patient recent pcp visit with anticipated meeting with pcp pharmacist and medicare enrollment changes Messaged Caitlin Harrington [phone] for an possible update on any medicare active enrollment changes for patient 11/03/21 reviewed with Caitlin Harrington the outreach from Caitlin Minta Balsam and attempted outreach to [employer] office pharmacy, [facility] staff with voice message left Allowed him time to ventilate his feelings  Outreached as a conference call to [place] program [phone] Allowed pt/caregiver to ask all questions and ventilate to [facility] pharmacy staff  member, Best boy briefly with Caitlin Trayce Caravello to pcp office, spoke with Corey Skains to leave a message for pcp nurse related to request for assist with new prescriptions for FEP mail order services, as many 3 month supply medicines and diabetic supplies as possible Left fax number [phone], call in number [phone] and electronic -[facility] mail order info Incoming call from [place], [facility] pharmacist to review pt request for mail order. RN CM answered pharmacist's questions. Updated Caitlin Harrington via Text as requested that the pcp office staff had responded and is sending the prescriptions 12/16/21 improved   Recommendations/Changes made from today's visit: Change patient to mail order services via her insurance FEP pharmacy benefit Outreach to pcp office to follow up on request for new prescriptions for mail order services provided mail order service number [phone] fax [phone]  Lab Results  Component Value Date   HGBA1C 10.7 (H) 05/03/2021   Patient Goals/Self-Care Activities: Take all medications as prescribed Attend all scheduled provider  appointments Call pharmacy for medication refills 3-7 days in advance of running out of medications Perform all self care activities independently  Perform IADL's (shopping, preparing meals, housekeeping, managing finances) independently Follow up with medicare enrollment staff from [place] and pcp office pharmacy staff for medication management   Follow Up Plan:  The patient has been provided with contact information for the care management team and has been advised to call with any health related questions or concerns.  The care management team will reach out to the patient again over the next 30+ business  days.       Plan: The patient has been provided with contact information for the care management team and has been advised to call with any health related questions or concerns.  The care management team will reach out to the patient again over the next 30+ business days.  Haniyah Maciolek L. Lavina Hamman, RN, BSN, [place] Coordinator Office number [phone] [facility] number [phone] Fax number [phone]

## 2022-01-01 NOTE — Therapy (Signed)
OUTPATIENT PHYSICAL THERAPY LOWER EXTREMITY EVALUATION   Patient Name: Caitlin Harrington MRN: [id number] DOB:08/28/1946, 76 y.o., female Today's Date: 01/02/2022   PT End of Session - 01/02/22 1144     Visit Number 1    Number of Visits 17    Date for PT Re-Evaluation 02/27/22    Authorization Type MCR/ BCBS    Authorization Time Period FOTO v6, v10; KX mod at v15    Progress Note Due on Visit 10    PT Start Time 1120    PT Stop Time 1210    PT Time Calculation (min) 50 min    Equipment Utilized During Treatment Gait belt    Activity Tolerance Patient tolerated treatment well    Behavior During Therapy WFL for tasks assessed/performed             Past Medical History:  Diagnosis Date   Asthma    COVID-19 12/2019   Depression    Diabetes mellitus without complication ([place])    type 2   Fatigue    Gout    Hypertension    Insomnia    Numbness    Obesity    Paresthesia    Stroke ([place]) 10/2020   Vitamin D deficiency    Past Surgical History:  Procedure Laterality Date   ABDOMINAL HYSTERECTOMY     BUBBLE STUDY  05/06/2021   Procedure: BUBBLE STUDY;  Surgeon: Thayer Headings, MD;  Location: [place];  Service: Cardiovascular;;   cataracts  2018   TEE WITHOUT CARDIOVERSION N/A 05/06/2021   Procedure: TRANSESOPHAGEAL ECHOCARDIOGRAM (TEE);  Surgeon: Acie Fredrickson Wonda Cheng, MD;  Location: [facility] ENDOSCOPY;  Service: Cardiovascular;  Laterality: N/A;   Patient Active Problem List   Diagnosis Date Noted   Exposure to intestinal infectious disease 11/03/2021   Hardening of the aorta (main artery of the heart) ([place]) 11/03/2021   Hyperglycemia due to type 2 diabetes mellitus ([place]) 11/03/2021   Memory impairment 11/03/2021   Relationship dysfunction 11/03/2021   Chronic kidney disease, stage 3a ([place]) 05/26/2021   Type 2 diabetes mellitus with other specified complication ([place]) [id number]   Unsteady gait    CVA (cerebral vascular accident) ([place]) 05/04/2021   Acute CVA  (cerebrovascular accident) ([place]) 05/04/2021   Pain in joint involving ankle and foot 11/06/2020   Acute renal insufficiency 11/06/2020   Allergic rhinitis 11/06/2020   Arthropathy 11/06/2020   Benign essential hypertension 11/06/2020   Bilateral carpal tunnel syndrome 11/06/2020   Chronic sinusitis 11/06/2020   Diverticulitis of colon 11/06/2020   Eczema 11/06/2020   Headache 11/06/2020   Long term (current) use of insulin ([place]) [id number]   Acute metabolic encephalopathy [id number]   Noncompliance with treatment 11/06/2020   Obstructive sleep apnea syndrome 11/06/2020   Other specified abnormal findings of blood chemistry 11/06/2020   Paresthesias 11/06/2020   Personal history of transient ischemic attack (TIA), and cerebral infarction without residual deficits 11/06/2020   Rash 11/06/2020   Depression 11/06/2020   Skin sensation disturbance 11/06/2020   Subclinical hypothyroidism 11/06/2020   Tick bite 11/06/2020   Tiredness 11/06/2020   Unspecified abnormal finding in specimens from other organs, systems and tissues 11/06/2020   Cerebral thrombosis with cerebral infarction 10/29/2020   Major depressive disorder, recurrent episode, moderate ([place]) 10/27/2020   Allergic reaction 10/26/2020   AKI (acute kidney injury) ([place]) 10/26/2020   Thrush 10/26/2020   Urinary tract infection without hematuria 05/12/2020   Encephalopathy 05/12/2020   Altered mental status    Hyperglycemia  Acute on chronic respiratory failure with hypoxia ([place]) 01/03/2020   COVID-19 12/30/2019   Hyperuricemia    Hypertension 02/18/2011   Prolonged depressive adjustment reaction 02/18/2011   Obesity 02/18/2011   Asthma, mild persistent 02/18/2011   Vitamin D deficiency 02/18/2011   Hyperlipidemia, unspecified 02/18/2011   Diabetes mellitus type 2, controlled, without complications ([place]) [id number]   Insomnia 02/18/2011   Stress at work 02/18/2011    PCP: Vernie Shanks, MD  REFERRING PROVIDER:  Vernie Shanks, MD  REFERRING DIAG: for decondition physically and h/o CVA with leg weakness and pain, arm pain  THERAPY DIAG:  Difficulty in walking, not elsewhere classified  Unsteadiness on feet  Muscle weakness (generalized)  Pain in left upper arm  Pain in left leg  ONSET DATE: 04/2021  SUBJECTIVE:   SUBJECTIVE STATEMENT: Pt reports primary c/o decreased endurance and Lt weakness/ pain s/p Rt thalamic stroke in June of 2022. Since this stroke, she has experienced Lt upper arm and Lt upper leg pain, which is not associated with movement or positioning. She also reports a moderate decline in short-term memory since this incident. Pt is accompanied by her caregiver, Elenore Rota. He helps with subjective information gathering. He reports that she has had 3-4 falls in the past 6 months, none of which have resulted in injury. She had imaging of her Lt hip/knee after her last fall, which ruled out fx. When admitted for the thalamic stroke, the pt reports being told that she has likely had multiple TIA's over the past couple of years. She has high blood pressure and diabetes, both of which are reportedly well-managed with medication. Current pain is 0/10. Worst pain is 7-8/10. Pt reports not being able to reach overhead or to the side with her Lt UE and also has regular sleep disturbances due to pain. Red flag screening negative.   PERTINENT HISTORY: Thalamic stroke June 2022, DMII, HTN, hardening or aorta  PAIN:  Are you having pain? No NPRS scale: 0/10 Pain location: Lt shoulder/ upper arm PAIN TYPE: aching Pain description: intermittent  Aggravating factors: Sleeping on Lt, reaching with Lt arm Relieving factors: Rest  PRECAUTIONS: Falls  WEIGHT BEARING RESTRICTIONS No  FALLS:  Has patient fallen in last 6 months? Yes, Number of falls: 3-4  LIVING ENVIRONMENT: Lives with:  caregiver, Elenore Rota Lives in: House/apartment Stairs: No;  Has following equipment at home:  None  OCCUPATION: Retired  PLOF: Independent  PATIENT GOALS Sweeping/mopping floor   OBJECTIVE:   DIAGNOSTIC FINDINGS: 05/25/2021: CT Head without contrast: IMPRESSION: 1. No acute intracranial abnormality. 2. Expected evolution of right thalamic infarct from prior exam. Additional small infarcts that were acute on prior MRI have no definite CT correlate. 3. Background atrophy and chronic small vessel ischemia.  PATIENT SURVEYS:  FOTO 44%, predicted 51% in 13 visits  COGNITION:  Overall cognitive status: History of cognitive impairments - at baseline; diminished short-term memory, flat affect     SENSATION:  Light touch: Appears intact BIL UE and LE  MUSCLE LENGTH: Thomas test: WNL BIL  POSTURE:  BIL genu valgum, forward posture  PALPATION: TTP to Lt lateral upper arm and Lt anterior thigh   LE MMT:  MMT Right 01/02/2022 Left 01/02/2022  Hip flexion 4+/5 3/5  Hip extension 3/5 2/5  Hip abduction 3/5 2/5  Knee flexion 5/5 4/5  Knee extension 5/5 4/5  Ankle dorsiflexion 5/5 4/5  Ankle plantarflexion 5/5 5/5   (Blank rows = not tested)  UE MMT:  MMT Right 01/02/2022  Left 01/02/2022  Shoulder flexion 4/5 3/5  Shoulder abduction 3+/5 2+/5  Shoulder ER 4+/5 2+/5  Shoulder IR 4/5 3/5  Elbow flexion 5/5 4/5  Elbow extension 5/5 4/5  Wrist flexion 5/5 4/5  Wrist extension 5/5 4/5   (Blank rows = not tested)  UE ROM: Lt shoulder abduction and flexion limited to roughly 100 degrees  FUNCTIONAL TESTS:  BERG BALANCE TEST Sitting to Standing: 3.      Stands independently using hands Standing Unsupported: 4.      Stands safely for 2 minutes Sitting Unsupported: 4.     Sits for 2 minutes independently Standing to Sitting: 3.     Controls descent with hands  Transfers: 3.     Transfers safely definite use of hands Standing with eyes closed: 4.     Stands safely for 10 seconds  Standing with feet together: 3.     Stands for 1 minute with supervision Reaching  forward with outstretched arm: 3.     Reaches forward 5 inches Retrieving object from the floor: 3.     Able to pick up with supervision Turning to look behind: 4.     Looks behind from both sides and weight shifts well Turning 360 degrees: 4.     Able to turn in </=4 seconds  Place alternate foot on stool: 3.     Completes 8 steps in >20 seconds Standing with one foot in front: 0.     Loses balance while standing/stepping Standing on one foot: 0.     Unable  Total Score: 41/56   GAIT: Distance walked: 20 feet Assistive device utilized: None Level of assistance: Complete Independence Comments: Increased BIL genu valgum, decreased swing BIL, midfoot strike    TODAY'S TREATMENT: [facility] Adult PT Treatment:                                                DATE: 01/02/2022 Issued HEP    PATIENT EDUCATION:  Education details: Pathophysiology of thalamic pain syndrome, prognosis, POC, FOTO, and HEP Person educated: Patient and Caregiver Education method: Explanation, Demonstration, and Handouts Education comprehension: verbalized understanding, returned demonstration, and needs further education   HOME EXERCISE PROGRAM: Access Code: [email or web address] URL: [email or web address] Date: 01/02/2022 Prepared by: Vanessa [place]  Exercises Supine Bridge - 1 x daily - 7 x weekly - 3 sets - 10 reps - 3-sec hold Sidelying Hip Abduction - 1 x daily - 7 x weekly - 2 sets - 10 reps - 3-sec hold Seated Long Arc Quad - 1 x daily - 7 x weekly - 3 sets - 10 reps - 5-sec hold Side to Side Weight Shift with Counter Support - 1 x daily - 7 x weekly - 3 sets - 10 reps Staggered Stance Forward Backward Weight Shift with Counter Support - 1 x daily - 7 x weekly - 3 sets - 10 reps   ASSESSMENT:  CLINICAL IMPRESSION: Patient is a 76yo y.o. F who was seen today for physical therapy evaluation and treatment for chronic Lt sided weakness, Lt upper arm and leg pain, and frequent falls resulting from a  thalamic stroke experienced in June of 2022. Objective impairments include Abnormal gait, decreased activity tolerance, decreased balance, decreased coordination, decreased endurance, decreased mobility, difficulty walking, decreased ROM, decreased strength, impaired UE functional use, postural dysfunction, and pain. It is likely that  her Lt sided pain is related to thalamic pain syndrome. These impairments are limiting patient from cleaning, community activity, laundry, medication management, and shopping. Personal factors including 3+ comorbidities: HTN, DMII, aortic stenosis, hx of stroke, and diminished short-term memory  are also affecting patient's functional outcome. Patient will benefit from skilled PT to address above impairments and improve overall function.  REHAB POTENTIAL: Fair due to poor short-term memory, low functional baseline, report of non-adherence to prior PT  CLINICAL DECISION MAKING: Unstable/unpredictable  EVALUATION COMPLEXITY: High   GOALS: Goals reviewed with patient? Yes  SHORT TERM GOALS:  STG Name Target Date Goal status  1 Pt will report understanding and adherence to her HEP in order to promote independence in the management of her symptoms. Baseline: HEP provided at eval 01/30/2022 INITIAL   LONG TERM GOALS:   LTG Name Target Date Goal status  1 Pt will improve her FOTO score to 51% in order to demonstrate improved functional ability as it relates to her primary impairments. Baseline:44% 02/27/2022 INITIAL  2 Pt will achieve a BERG score of 52 points in order to demonstrate improved functional balance needed to put on pants while standing. Baseline: 41 02/27/2022 INITIAL  3 Pt will achieve BIL global UE and LE strength of 4/5 or greater in order to progress her independent strengthening program with less limitation. Baseline: See MMT chart 02/27/2022 INITIAL  4 Pt will report adherence to an advanced HEP in order to promote further progress toward her functional  goals. Baseline: Initial HEP provided at eval 02/27/2022 INITIAL  5 Pt will demonstrate ability to lift 3 pounds overhead with her Lt UE in order to put away dishes with less limitation. Baseline: Pt unable to reach overhead with Lt UE. 02/27/2022 INITIAL   PLAN: PT FREQUENCY: 2x/week  PT DURATION: 8 weeks  PLANNED INTERVENTIONS: Therapeutic exercises, Therapeutic activity, Neuro Muscular re-education, Balance training, Gait training, Patient/Family education, Joint mobilization, Stair training, Aquatic Therapy, Dry Needling, Electrical stimulation, Cryotherapy, Moist heat, Taping, and Manual therapy  PLAN FOR NEXT SESSION: Progress early balance/ strengthening; introduce desensitization techniques for thalamic pain   Vanessa [place], PT, DPT 01/02/22 1:28 PM

## 2022-01-02 ENCOUNTER — Other Ambulatory Visit: Payer: Self-pay

## 2022-01-02 ENCOUNTER — Ambulatory Visit: Payer: Medicare Other | Attending: Family Medicine

## 2022-01-02 DIAGNOSIS — R262 Difficulty in walking, not elsewhere classified: Secondary | ICD-10-CM | POA: Diagnosis not present

## 2022-01-02 DIAGNOSIS — M6281 Muscle weakness (generalized): Secondary | ICD-10-CM | POA: Diagnosis not present

## 2022-01-02 DIAGNOSIS — M79605 Pain in left leg: Secondary | ICD-10-CM | POA: Insufficient documentation

## 2022-01-02 DIAGNOSIS — R2681 Unsteadiness on feet: Secondary | ICD-10-CM | POA: Diagnosis not present

## 2022-01-02 DIAGNOSIS — M79622 Pain in left upper arm: Secondary | ICD-10-CM | POA: Insufficient documentation

## 2022-01-06 ENCOUNTER — Ambulatory Visit: Payer: Medicare Other

## 2022-01-06 ENCOUNTER — Other Ambulatory Visit: Payer: Self-pay

## 2022-01-06 DIAGNOSIS — M6281 Muscle weakness (generalized): Secondary | ICD-10-CM

## 2022-01-06 DIAGNOSIS — M79605 Pain in left leg: Secondary | ICD-10-CM | POA: Diagnosis not present

## 2022-01-06 DIAGNOSIS — R262 Difficulty in walking, not elsewhere classified: Secondary | ICD-10-CM | POA: Diagnosis not present

## 2022-01-06 DIAGNOSIS — M79622 Pain in left upper arm: Secondary | ICD-10-CM

## 2022-01-06 DIAGNOSIS — R2681 Unsteadiness on feet: Secondary | ICD-10-CM

## 2022-01-06 NOTE — Therapy (Signed)
OUTPATIENT PHYSICAL THERAPY TREATMENT NOTE   Patient Name: Caitlin Harrington MRN: [id number] DOB:May 13, 1946, 76 y.o., female Today's Date: 01/06/2022  PCP: Vernie Shanks, MD REFERRING PROVIDER: Vernie Shanks, MD   PT End of Session - 01/06/22 1831     Visit Number 2    Number of Visits 17    Date for PT Re-Evaluation 02/27/22    Authorization Type MCR/ BCBS    Authorization Time Period FOTO v6, v10; KX mod at v15    Progress Note Due on Visit 10    PT Start Time 1830    PT Stop Time 1910    PT Time Calculation (min) 40 min    Equipment Utilized During Treatment Gait belt    Activity Tolerance Patient tolerated treatment well    Behavior During Therapy [facility] for tasks assessed/performed             Past Medical History:  Diagnosis Date   Asthma    COVID-19 12/2019   Depression    Diabetes mellitus without complication ([place])    type 2   Fatigue    Gout    Hypertension    Insomnia    Numbness    Obesity    Paresthesia    Stroke ([place]) 10/2020   Vitamin D deficiency    Past Surgical History:  Procedure Laterality Date   ABDOMINAL HYSTERECTOMY     BUBBLE STUDY  05/06/2021   Procedure: BUBBLE STUDY;  Surgeon: Thayer Headings, MD;  Location: [place];  Service: Cardiovascular;;   cataracts  2018   TEE WITHOUT CARDIOVERSION N/A 05/06/2021   Procedure: TRANSESOPHAGEAL ECHOCARDIOGRAM (TEE);  Surgeon: Acie Fredrickson Wonda Cheng, MD;  Location: [facility] ENDOSCOPY;  Service: Cardiovascular;  Laterality: N/A;   Patient Active Problem List   Diagnosis Date Noted   Exposure to intestinal infectious disease 11/03/2021   Hardening of the aorta (main artery of the heart) ([place]) 11/03/2021   Hyperglycemia due to type 2 diabetes mellitus ([place]) 11/03/2021   Memory impairment 11/03/2021   Relationship dysfunction 11/03/2021   Chronic kidney disease, stage 3a ([place]) 05/26/2021   Type 2 diabetes mellitus with other specified complication ([place]) [id number]   Unsteady gait    CVA (cerebral  vascular accident) ([place]) 05/04/2021   Acute CVA (cerebrovascular accident) ([place]) 05/04/2021   Pain in joint involving ankle and foot 11/06/2020   Acute renal insufficiency 11/06/2020   Allergic rhinitis 11/06/2020   Arthropathy 11/06/2020   Benign essential hypertension 11/06/2020   Bilateral carpal tunnel syndrome 11/06/2020   Chronic sinusitis 11/06/2020   Diverticulitis of colon 11/06/2020   Eczema 11/06/2020   Headache 11/06/2020   Long term (current) use of insulin ([place]) [id number]   Acute metabolic encephalopathy [id number]   Noncompliance with treatment 11/06/2020   Obstructive sleep apnea syndrome 11/06/2020   Other specified abnormal findings of blood chemistry 11/06/2020   Paresthesias 11/06/2020   Personal history of transient ischemic attack (TIA), and cerebral infarction without residual deficits 11/06/2020   Rash 11/06/2020   Depression 11/06/2020   Skin sensation disturbance 11/06/2020   Subclinical hypothyroidism 11/06/2020   Tick bite 11/06/2020   Tiredness 11/06/2020   Unspecified abnormal finding in specimens from other organs, systems and tissues 11/06/2020   Cerebral thrombosis with cerebral infarction 10/29/2020   Major depressive disorder, recurrent episode, moderate ([place]) 10/27/2020   Allergic reaction 10/26/2020   AKI (acute kidney injury) ([place]) 10/26/2020   Thrush 10/26/2020   Urinary tract infection without hematuria 05/12/2020   Encephalopathy 05/12/2020  Altered mental status    Hyperglycemia    Acute on chronic respiratory failure with hypoxia ([place]) 01/03/2020   COVID-19 12/30/2019   Hyperuricemia    Hypertension 02/18/2011   Prolonged depressive adjustment reaction 02/18/2011   Obesity 02/18/2011   Asthma, mild persistent 02/18/2011   Vitamin D deficiency 02/18/2011   Hyperlipidemia, unspecified 02/18/2011   Diabetes mellitus type 2, controlled, without complications ([place]) [id number]   Insomnia 02/18/2011   Stress at work 02/18/2011     REFERRING DIAG: for decondition physically and h/o CVA with leg weakness and pain, arm pain  THERAPY DIAG:  Difficulty in walking, not elsewhere classified  Unsteadiness on feet  Muscle weakness (generalized)  Pain in left upper arm  Pain in left leg  PERTINENT HISTORY: Thalamic stroke June 2022, DMII, HTN, hardening or aorta  PRECAUTIONS: Falls  SUBJECTIVE: Pt denies any pain today, adding that she has just been fatigued all day. She reports doing her HEP daily, except for one day.  PAIN:  Are you having pain? No NPRS scale: 0/10 Pain location: Lt upper arm and Lt thigh     OBJECTIVE:    DIAGNOSTIC FINDINGS: 05/25/2021: CT Head without contrast: IMPRESSION: 1. No acute intracranial abnormality. 2. Expected evolution of right thalamic infarct from prior exam. Additional small infarcts that were acute on prior MRI have no definite CT correlate. 3. Background atrophy and chronic small vessel ischemia.   PATIENT SURVEYS:  FOTO 44%, predicted 51% in 13 visits   COGNITION:          Overall cognitive status: History of cognitive impairments - at baseline; diminished short-term memory, flat affect                            SENSATION:          Light touch: Appears intact BIL UE and LE   MUSCLE LENGTH: Thomas test: WNL BIL   POSTURE:  BIL genu valgum, forward posture   PALPATION: TTP to Lt lateral upper arm and Lt anterior thigh     LE MMT:   MMT Right 01/02/2022 Left 01/02/2022  Hip flexion 4+/5 3/5  Hip extension 3/5 2/5  Hip abduction 3/5 2/5  Knee flexion 5/5 4/5  Knee extension 5/5 4/5  Ankle dorsiflexion 5/5 4/5  Ankle plantarflexion 5/5 5/5   (Blank rows = not tested)   UE MMT:   MMT Right 01/02/2022 Left 01/02/2022  Shoulder flexion 4/5 3/5  Shoulder abduction 3+/5 2+/5  Shoulder ER 4+/5 2+/5  Shoulder IR 4/5 3/5  Elbow flexion 5/5 4/5  Elbow extension 5/5 4/5  Wrist flexion 5/5 4/5  Wrist extension 5/5 4/5   (Blank rows = not  tested)   UE ROM: Lt shoulder abduction and flexion limited to roughly 100 degrees   FUNCTIONAL TESTS:  BERG BALANCE TEST Sitting to Standing: 3.      Stands independently using hands Standing Unsupported: 4.      Stands safely for 2 minutes Sitting Unsupported: 4.     Sits for 2 minutes independently Standing to Sitting: 3.     Controls descent with hands  Transfers: 3.     Transfers safely definite use of hands Standing with eyes closed: 4.     Stands safely for 10 seconds  Standing with feet together: 3.     Stands for 1 minute with supervision Reaching forward with outstretched arm: 3.     Reaches forward 5 inches Retrieving object  from the floor: 3.     Able to pick up with supervision Turning to look behind: 4.     Looks behind from both sides and weight shifts well Turning 360 degrees: 4.     Able to turn in </=4 seconds  Place alternate foot on stool: 3.     Completes 8 steps in >20 seconds Standing with one foot in front: 0.     Loses balance while standing/stepping Standing on one foot: 0.     Unable   Total Score: 41/56     GAIT: Distance walked: 20 feet Assistive device utilized: None Level of assistance: Complete Independence Comments: Increased BIL genu valgum, decreased swing BIL, midfoot strike       TODAY'S TREATMENT:  [facility] Adult PT Treatment:                                                DATE: 01/06/2022 Therapeutic Exercise: Seated shoulder ER with RTB 2x10 BIL Seated shoulder IR with RTB 2x10 BIL Mini-squat side-stepping in // bars x4 laps Squat in // bars 3x10 DL heel raises in // bars 3x10 Seated shoulder scaption with 1# DB on Lt 3x10 Manual Therapy: N/A Neuromuscular re-ed: Semi-tandem stance 2x30sec BIL in // bars Standing marching with controlled leg lowering 3x20  Backward walking into PT resistance, corrective balance strategy as PT releases resistance x3 laps in // bars Therapeutic Activity: N/A Modalities: N/A Self Care: N/A   [facility]  Adult PT Treatment:                                                DATE: 01/02/2022 Issued HEP       PATIENT EDUCATION:  Education details: Pathophysiology of thalamic pain syndrome, prognosis, POC, FOTO, and HEP Person educated: Patient and Caregiver Education method: Explanation, Demonstration, and Handouts Education comprehension: verbalized understanding, returned demonstration, and needs further education     HOME EXERCISE PROGRAM: Access Code: [email or web address] URL: [email or web address] Date: 01/02/2022 Prepared by: Vanessa [place]   Exercises Supine Bridge - 1 x daily - 7 x weekly - 3 sets - 10 reps - 3-sec hold Sidelying Hip Abduction - 1 x daily - 7 x weekly - 2 sets - 10 reps - 3-sec hold Seated Long Arc Quad - 1 x daily - 7 x weekly - 3 sets - 10 reps - 5-sec hold Side to Side Weight Shift with Counter Support - 1 x daily - 7 x weekly - 3 sets - 10 reps Staggered Stance Forward Backward Weight Shift with Counter Support - 1 x daily - 7 x weekly - 3 sets - 10 reps     ASSESSMENT:   CLINICAL IMPRESSION: Pt responded well to all interventions today, demonstrating good form and no pain with selected exercises. Due to not presenting with pain today, desensitization techniques will be utilized at a different time when needed by pt. The pt demonstrates continued need for corrective balance training as she required regular assistance from PT with corrective balance training today. She will continue to benefit from skilled PT to address her primary impairments and return to her prior level of function with less limitation.   REHAB POTENTIAL: Fair due to poor short-term memory, low functional  baseline, report of non-adherence to prior PT   CLINICAL DECISION MAKING: Unstable/unpredictable   EVALUATION COMPLEXITY: High     GOALS: Goals reviewed with patient? Yes   SHORT TERM GOALS:   STG Name Target Date Goal status  1 Pt will report understanding and adherence to  her HEP in order to promote independence in the management of her symptoms. Baseline: HEP provided at eval 01/30/2022 INITIAL    LONG TERM GOALS:    LTG Name Target Date Goal status  1 Pt will improve her FOTO score to 51% in order to demonstrate improved functional ability as it relates to her primary impairments. Baseline:44% 02/27/2022 INITIAL  2 Pt will achieve a BERG score of 52 points in order to demonstrate improved functional balance needed to put on pants while standing. Baseline: 41 02/27/2022 INITIAL  3 Pt will achieve BIL global UE and LE strength of 4/5 or greater in order to progress her independent strengthening program with less limitation. Baseline: See MMT chart 02/27/2022 INITIAL  4 Pt will report adherence to an advanced HEP in order to promote further progress toward her functional goals. Baseline: Initial HEP provided at eval 02/27/2022 INITIAL  5 Pt will demonstrate ability to lift 3 pounds overhead with her Lt UE in order to put away dishes with less limitation. Baseline: Pt unable to reach overhead with Lt UE. 02/27/2022 INITIAL    PLAN: PT FREQUENCY: 2x/week   PT DURATION: 8 weeks   PLANNED INTERVENTIONS: Therapeutic exercises, Therapeutic activity, Neuro Muscular re-education, Balance training, Gait training, Patient/Family education, Joint mobilization, Stair training, Aquatic Therapy, Dry Needling, Electrical stimulation, Cryotherapy, Moist heat, Taping, and Manual therapy   PLAN FOR NEXT SESSION: Progress early balance/ strengthening; introduce desensitization techniques for thalamic pain    Vanessa [place], PT, DPT 01/06/22 7:12 PM

## 2022-01-13 ENCOUNTER — Ambulatory Visit: Payer: Medicare Other

## 2022-01-13 ENCOUNTER — Telehealth: Payer: Self-pay

## 2022-01-13 NOTE — Telephone Encounter (Signed)
Spoke to patient, had forgotten about appointment. Cofirmed next appointment.

## 2022-01-13 NOTE — Therapy (Incomplete)
OUTPATIENT PHYSICAL THERAPY TREATMENT NOTE   Patient Name: Caitlin Harrington MRN: [id number] DOB:[birth year], 76 y.o., female Today's Date: 01/13/2022  PCP: Vernie Shanks, MD REFERRING PROVIDER: Vernie Shanks, MD     Past Medical History:  Diagnosis Date   Asthma    COVID-19 12/2019   Depression    Diabetes mellitus without complication ([place])    type 2   Fatigue    Gout    Hypertension    Insomnia    Numbness    Obesity    Paresthesia    Stroke ([place]) 10/2020   Vitamin D deficiency    Past Surgical History:  Procedure Laterality Date   ABDOMINAL HYSTERECTOMY     BUBBLE STUDY  05/06/2021   Procedure: BUBBLE STUDY;  Surgeon: Thayer Headings, MD;  Location: [place];  Service: Cardiovascular;;   cataracts  2018   TEE WITHOUT CARDIOVERSION N/A 05/06/2021   Procedure: TRANSESOPHAGEAL ECHOCARDIOGRAM (TEE);  Surgeon: Acie Fredrickson Wonda Cheng, MD;  Location: [facility] ENDOSCOPY;  Service: Cardiovascular;  Laterality: N/A;   Patient Active Problem List   Diagnosis Date Noted   Exposure to intestinal infectious disease 11/03/2021   Hardening of the aorta (main artery of the heart) ([place]) 11/03/2021   Hyperglycemia due to type 2 diabetes mellitus ([place]) 11/03/2021   Memory impairment 11/03/2021   Relationship dysfunction 11/03/2021   Chronic kidney disease, stage 3a ([place]) 05/26/2021   Type 2 diabetes mellitus with other specified complication ([place]) [id number]   Unsteady gait    CVA (cerebral vascular accident) ([place]) 05/04/2021   Acute CVA (cerebrovascular accident) ([place]) 05/04/2021   Pain in joint involving ankle and foot 11/06/2020   Acute renal insufficiency 11/06/2020   Allergic rhinitis 11/06/2020   Arthropathy 11/06/2020   Benign essential hypertension 11/06/2020   Bilateral carpal tunnel syndrome 11/06/2020   Chronic sinusitis 11/06/2020   Diverticulitis of colon 11/06/2020   Eczema 11/06/2020   Headache 11/06/2020   Long term (current) use of insulin ([place]) [id number]    Acute metabolic encephalopathy [id number]   Noncompliance with treatment 11/06/2020   Obstructive sleep apnea syndrome 11/06/2020   Other specified abnormal findings of blood chemistry 11/06/2020   Paresthesias 11/06/2020   Personal history of transient ischemic attack (TIA), and cerebral infarction without residual deficits 11/06/2020   Rash 11/06/2020   Depression 11/06/2020   Skin sensation disturbance 11/06/2020   Subclinical hypothyroidism 11/06/2020   Tick bite 11/06/2020   Tiredness 11/06/2020   Unspecified abnormal finding in specimens from other organs, systems and tissues 11/06/2020   Cerebral thrombosis with cerebral infarction 10/29/2020   Major depressive disorder, recurrent episode, moderate ([place]) 10/27/2020   Allergic reaction 10/26/2020   AKI (acute kidney injury) ([place]) 10/26/2020   Thrush 10/26/2020   Urinary tract infection without hematuria 05/12/2020   Encephalopathy 05/12/2020   Altered mental status    Hyperglycemia    Acute on chronic respiratory failure with hypoxia ([place]) 01/03/2020   COVID-19 12/30/2019   Hyperuricemia    Hypertension 02/18/2011   Prolonged depressive adjustment reaction 02/18/2011   Obesity 02/18/2011   Asthma, mild persistent 02/18/2011   Vitamin D deficiency 02/18/2011   Hyperlipidemia, unspecified 02/18/2011   Diabetes mellitus type 2, controlled, without complications ([place]) [id number]   Insomnia 02/18/2011   Stress at work 02/18/2011    REFERRING DIAG: for decondition physically and h/o CVA with leg weakness and pain, arm pain  THERAPY DIAG:  No diagnosis found.  PERTINENT HISTORY: Thalamic stroke June 2022, DMII, HTN, hardening or aorta  PRECAUTIONS: Falls  SUBJECTIVE: *** Pt denies any pain today, adding that she has just been fatigued all day. She reports doing her HEP daily, except for one day.  PAIN:  Are you having pain? No NPRS scale: 0/10 Pain location: Lt upper arm and Lt thigh     OBJECTIVE:     DIAGNOSTIC FINDINGS: 05/25/2021: CT Head without contrast: IMPRESSION: 1. No acute intracranial abnormality. 2. Expected evolution of right thalamic infarct from prior exam. Additional small infarcts that were acute on prior MRI have no definite CT correlate. 3. Background atrophy and chronic small vessel ischemia.   PATIENT SURVEYS:  FOTO 44%, predicted 51% in 13 visits   COGNITION:          Overall cognitive status: History of cognitive impairments - at baseline; diminished short-term memory, flat affect                            SENSATION:          Light touch: Appears intact BIL UE and LE   MUSCLE LENGTH: Thomas test: WNL BIL   POSTURE:  BIL genu valgum, forward posture   PALPATION: TTP to Lt lateral upper arm and Lt anterior thigh     LE MMT:   MMT Right 01/02/2022 Left 01/02/2022  Hip flexion 4+/5 3/5  Hip extension 3/5 2/5  Hip abduction 3/5 2/5  Knee flexion 5/5 4/5  Knee extension 5/5 4/5  Ankle dorsiflexion 5/5 4/5  Ankle plantarflexion 5/5 5/5   (Blank rows = not tested)   UE MMT:   MMT Right 01/02/2022 Left 01/02/2022  Shoulder flexion 4/5 3/5  Shoulder abduction 3+/5 2+/5  Shoulder ER 4+/5 2+/5  Shoulder IR 4/5 3/5  Elbow flexion 5/5 4/5  Elbow extension 5/5 4/5  Wrist flexion 5/5 4/5  Wrist extension 5/5 4/5   (Blank rows = not tested)   UE ROM: Lt shoulder abduction and flexion limited to roughly 100 degrees   FUNCTIONAL TESTS:  BERG BALANCE TEST Sitting to Standing: 3.      Stands independently using hands Standing Unsupported: 4.      Stands safely for 2 minutes Sitting Unsupported: 4.     Sits for 2 minutes independently Standing to Sitting: 3.     Controls descent with hands  Transfers: 3.     Transfers safely definite use of hands Standing with eyes closed: 4.     Stands safely for 10 seconds  Standing with feet together: 3.     Stands for 1 minute with supervision Reaching forward with outstretched arm: 3.     Reaches forward 5  inches Retrieving object from the floor: 3.     Able to pick up with supervision Turning to look behind: 4.     Looks behind from both sides and weight shifts well Turning 360 degrees: 4.     Able to turn in </=4 seconds  Place alternate foot on stool: 3.     Completes 8 steps in >20 seconds Standing with one foot in front: 0.     Loses balance while standing/stepping Standing on one foot: 0.     Unable   Total Score: 41/56     GAIT: Distance walked: 20 feet Assistive device utilized: None Level of assistance: Complete Independence Comments: Increased BIL genu valgum, decreased swing BIL, midfoot strike       TODAY'S TREATMENT: [facility] Adult PT Treatment:  DATE: 01/13/2022 Therapeutic Exercise: Seated shoulder ER with RTB 2x10 BIL Seated shoulder IR with RTB 2x10 BIL Mini-squat side-stepping in // bars x4 laps Squat in // bars 3x10 DL heel raises in // bars 3x10 Seated shoulder scaption with 1# DB on Lt 3x10 Neuromuscular re-ed: Semi-tandem stance 2x30sec BIL in // bars Standing marching with controlled leg lowering 3x20  Toe taps to bosu in // bars 3x10   [facility] Adult PT Treatment:                                                DATE: 01/06/2022 Therapeutic Exercise: Seated shoulder ER with RTB 2x10 BIL Seated shoulder IR with RTB 2x10 BIL Mini-squat side-stepping in // bars x4 laps Squat in // bars 3x10 DL heel raises in // bars 3x10 Seated shoulder scaption with 1# DB on Lt 3x10 Manual Therapy: N/A Neuromuscular re-ed: Semi-tandem stance 2x30sec BIL in // bars Standing marching with controlled leg lowering 3x20  Backward walking into PT resistance, corrective balance strategy as PT releases resistance x3 laps in // bars Therapeutic Activity: N/A Modalities: N/A Self Care: N/A   [facility] Adult PT Treatment:                                                DATE: 01/02/2022 Issued HEP       PATIENT EDUCATION:  Education  details: Pathophysiology of thalamic pain syndrome, prognosis, POC, FOTO, and HEP Person educated: Patient and Caregiver Education method: Explanation, Demonstration, and Handouts Education comprehension: verbalized understanding, returned demonstration, and needs further education     HOME EXERCISE PROGRAM: Access Code: [email or web address] URL: [email or web address] Date: 01/02/2022 Prepared by: Vanessa [place]   Exercises Supine Bridge - 1 x daily - 7 x weekly - 3 sets - 10 reps - 3-sec hold Sidelying Hip Abduction - 1 x daily - 7 x weekly - 2 sets - 10 reps - 3-sec hold Seated Long Arc Quad - 1 x daily - 7 x weekly - 3 sets - 10 reps - 5-sec hold Side to Side Weight Shift with Counter Support - 1 x daily - 7 x weekly - 3 sets - 10 reps Staggered Stance Forward Backward Weight Shift with Counter Support - 1 x daily - 7 x weekly - 3 sets - 10 reps     ASSESSMENT:   CLINICAL IMPRESSION: ***  Pt responded well to all interventions today, demonstrating good form and no pain with selected exercises. Due to not presenting with pain today, desensitization techniques will be utilized at a different time when needed by pt. The pt demonstrates continued need for corrective balance training as she required regular assistance from PT with corrective balance training today. She will continue to benefit from skilled PT to address her primary impairments and return to her prior level of function with less limitation.   REHAB POTENTIAL: Fair due to poor short-term memory, low functional baseline, report of non-adherence to prior PT   CLINICAL DECISION MAKING: Unstable/unpredictable   EVALUATION COMPLEXITY: High     GOALS: Goals reviewed with patient? Yes   SHORT TERM GOALS:   STG Name Target Date Goal status  1 Pt will report understanding and adherence to her HEP in order to  promote independence in the management of her symptoms. Baseline: HEP provided at eval 01/30/2022 INITIAL     LONG TERM GOALS:    LTG Name Target Date Goal status  1 Pt will improve her FOTO score to 51% in order to demonstrate improved functional ability as it relates to her primary impairments. Baseline:44% 02/27/2022 INITIAL  2 Pt will achieve a BERG score of 52 points in order to demonstrate improved functional balance needed to put on pants while standing. Baseline: 41 02/27/2022 INITIAL  3 Pt will achieve BIL global UE and LE strength of 4/5 or greater in order to progress her independent strengthening program with less limitation. Baseline: See MMT chart 02/27/2022 INITIAL  4 Pt will report adherence to an advanced HEP in order to promote further progress toward her functional goals. Baseline: Initial HEP provided at eval 02/27/2022 INITIAL  5 Pt will demonstrate ability to lift 3 pounds overhead with her Lt UE in order to put away dishes with less limitation. Baseline: Pt unable to reach overhead with Lt UE. 02/27/2022 INITIAL    PLAN: PT FREQUENCY: 2x/week   PT DURATION: 8 weeks   PLANNED INTERVENTIONS: Therapeutic exercises, Therapeutic activity, Neuro Muscular re-education, Balance training, Gait training, Patient/Family education, Joint mobilization, Stair training, Aquatic Therapy, Dry Needling, Electrical stimulation, Cryotherapy, Moist heat, Taping, and Manual therapy   PLAN FOR NEXT SESSION: Progress early balance/ strengthening; introduce desensitization techniques for thalamic pain   Evelene Croon, PTA 01/13/22 1:09 PM

## 2022-01-16 ENCOUNTER — Encounter: Payer: Self-pay | Admitting: Physical Therapy

## 2022-01-16 ENCOUNTER — Ambulatory Visit: Payer: Medicare Other | Admitting: Physical Therapy

## 2022-01-16 ENCOUNTER — Other Ambulatory Visit: Payer: Self-pay

## 2022-01-16 DIAGNOSIS — R262 Difficulty in walking, not elsewhere classified: Secondary | ICD-10-CM

## 2022-01-16 DIAGNOSIS — R2681 Unsteadiness on feet: Secondary | ICD-10-CM | POA: Diagnosis not present

## 2022-01-16 DIAGNOSIS — M79622 Pain in left upper arm: Secondary | ICD-10-CM

## 2022-01-16 DIAGNOSIS — M6281 Muscle weakness (generalized): Secondary | ICD-10-CM | POA: Diagnosis not present

## 2022-01-16 DIAGNOSIS — M79605 Pain in left leg: Secondary | ICD-10-CM | POA: Diagnosis not present

## 2022-01-16 NOTE — Therapy (Signed)
OUTPATIENT PHYSICAL THERAPY TREATMENT NOTE   Patient Name: Caitlin Harrington MRN: [id number] DOB:1946/07/03, 76 y.o., female Today's Date: 01/16/2022  PCP: Vernie Shanks, MD REFERRING PROVIDER: Vernie Shanks, MD   PT End of Session - 01/16/22 1131     Visit Number 3    Number of Visits 17    Date for PT Re-Evaluation 02/27/22    Authorization Type MCR/ BCBS    Authorization Time Period FOTO v6, v10; KX mod at v15    Progress Note Due on Visit 10    PT Start Time 1130    PT Stop Time 1212    PT Time Calculation (min) 42 min    Equipment Utilized During Treatment Gait belt    Activity Tolerance Patient tolerated treatment well    Behavior During Therapy [facility] for tasks assessed/performed             Past Medical History:  Diagnosis Date   Asthma    COVID-19 12/2019   Depression    Diabetes mellitus without complication ([place])    type 2   Fatigue    Gout    Hypertension    Insomnia    Numbness    Obesity    Paresthesia    Stroke ([place]) 10/2020   Vitamin D deficiency    Past Surgical History:  Procedure Laterality Date   ABDOMINAL HYSTERECTOMY     BUBBLE STUDY  05/06/2021   Procedure: BUBBLE STUDY;  Surgeon: Thayer Headings, MD;  Location: [place];  Service: Cardiovascular;;   cataracts  2018   TEE WITHOUT CARDIOVERSION N/A 05/06/2021   Procedure: TRANSESOPHAGEAL ECHOCARDIOGRAM (TEE);  Surgeon: Acie Fredrickson Wonda Cheng, MD;  Location[facility] ENDOSCOPY;  Service: Cardiovascular;  Laterality: N/A;   Patient Active Problem List   Diagnosis Date Noted   Exposure to intestinal infectious disease 11/03/2021   Hardening of the aorta (main artery of the heart) ([place]) 11/03/2021   Hyperglycemia due to type 2 diabetes mellitus ([place]) 11/03/2021   Memory impairment 11/03/2021   Relationship dysfunction 11/03/2021   Chronic kidney disease, stage 3a ([place]) 05/26/2021   Type 2 diabetes mellitus with other specified complication ([place]) [id number]   Unsteady gait    CVA (cerebral  vascular accident) ([place]) 05/04/2021   Acute CVA (cerebrovascular accident) ([place]) 05/04/2021   Pain in joint involving ankle and foot 11/06/2020   Acute renal insufficiency 11/06/2020   Allergic rhinitis 11/06/2020   Arthropathy 11/06/2020   Benign essential hypertension 11/06/2020   Bilateral carpal tunnel syndrome 11/06/2020   Chronic sinusitis 11/06/2020   Diverticulitis of colon 11/06/2020   Eczema 11/06/2020   Headache 11/06/2020   Long term (current) use of insulin ([place]) [id number]   Acute metabolic encephalopathy [id number]   Noncompliance with treatment 11/06/2020   Obstructive sleep apnea syndrome 11/06/2020   Other specified abnormal findings of blood chemistry 11/06/2020   Paresthesias 11/06/2020   Personal history of transient ischemic attack (TIA), and cerebral infarction without residual deficits 11/06/2020   Rash 11/06/2020   Depression 11/06/2020   Skin sensation disturbance 11/06/2020   Subclinical hypothyroidism 11/06/2020   Tick bite 11/06/2020   Tiredness 11/06/2020   Unspecified abnormal finding in specimens from other organs, systems and tissues 11/06/2020   Cerebral thrombosis with cerebral infarction 10/29/2020   Major depressive disorder, recurrent episode, moderate ([place]) 10/27/2020   Allergic reaction 10/26/2020   AKI (acute kidney injury) ([place]) 10/26/2020   Thrush 10/26/2020   Urinary tract infection without hematuria 05/12/2020   Encephalopathy 05/12/2020  Altered mental status    Hyperglycemia    Acute on chronic respiratory failure with hypoxia ([facility]) 01/03/2020   COVID-19 12/30/2019   Hyperuricemia    Hypertension 02/18/2011   Prolonged depressive adjustment reaction 02/18/2011   Obesity 02/18/2011   Asthma, mild persistent 02/18/2011   Vitamin D deficiency 02/18/2011   Hyperlipidemia, unspecified 02/18/2011   Diabetes mellitus type 2, controlled, without complications ([facility]) 02/18/2011   Insomnia 02/18/2011   Stress at work 02/18/2011     REFERRING DIAG: for decondition physically and h/o CVA with leg weakness and pain, arm pain  THERAPY DIAG:  Difficulty in walking, not elsewhere classified  Unsteadiness on feet  Muscle weakness (generalized)  Pain in left upper arm  PERTINENT HISTORY: Thalamic stroke June 2022, DMII, HTN, hardening or aorta  PRECAUTIONS: Falls  SUBJECTIVE:  Pt reports that she is doing well overall.  She is having pain in her upper arm today.  She feels that PT has been helpful.  PAIN:  Are you having pain? Yes NPRS scale: 10/10 Pain location: Lt upper arm    OBJECTIVE:    DIAGNOSTIC FINDINGS: 05/25/2021: CT Head without contrast: IMPRESSION: 1. No acute intracranial abnormality. 2. Expected evolution of right thalamic infarct from prior exam. Additional small infarcts that were acute on prior MRI have no definite CT correlate. 3. Background atrophy and chronic small vessel ischemia.   PATIENT SURVEYS:  FOTO 44%, predicted 51% in 13 visits   COGNITION:          Overall cognitive status: History of cognitive impairments - at baseline; diminished short-term memory, flat affect                            SENSATION:          Light touch: Appears intact BIL UE and LE   MUSCLE LENGTH: Thomas test: WNL BIL   POSTURE:  BIL genu valgum, forward posture   PALPATION: TTP to Lt lateral upper arm and Lt anterior thigh     LE MMT:   MMT Right 01/02/2022 Left 01/02/2022  Hip flexion 4+/5 3/5  Hip extension 3/5 2/5  Hip abduction 3/5 2/5  Knee flexion 5/5 4/5  Knee extension 5/5 4/5  Ankle dorsiflexion 5/5 4/5  Ankle plantarflexion 5/5 5/5   (Blank rows = not tested)   UE MMT:   MMT Right 01/02/2022 Left 01/02/2022  Shoulder flexion 4/5 3/5  Shoulder abduction 3+/5 2+/5  Shoulder ER 4+/5 2+/5  Shoulder IR 4/5 3/5  Elbow flexion 5/5 4/5  Elbow extension 5/5 4/5  Wrist flexion 5/5 4/5  Wrist extension 5/5 4/5   (Blank rows = not tested)   UE ROM: Lt shoulder abduction  and flexion limited to roughly 100 degrees   FUNCTIONAL TESTS:  BERG BALANCE TEST Sitting to Standing: 3.      Stands independently using hands Standing Unsupported: 4.      Stands safely for 2 minutes Sitting Unsupported: 4.     Sits for 2 minutes independently Standing to Sitting: 3.     Controls descent with hands  Transfers: 3.     Transfers safely definite use of hands Standing with eyes closed: 4.     Stands safely for 10 seconds  Standing with feet together: 3.     Stands for 1 minute with supervision Reaching forward with outstretched arm: 3.     Reaches forward 5 inches Retrieving object from the floor: 3.  Able to pick up with supervision Turning to look behind: 4.     Looks behind from both sides and weight shifts well Turning 360 degrees: 4.     Able to turn in </=4 seconds  Place alternate foot on stool: 3.     Completes 8 steps in >20 seconds Standing with one foot in front: 0.     Loses balance while standing/stepping Standing on one foot: 0.     Unable   Total Score: 41/56     GAIT: Distance walked: 20 feet Assistive device utilized: None Level of assistance: Complete Independence Comments: Increased BIL genu valgum, decreased swing BIL, midfoot strike       TODAY'S TREATMENT:  Treatment:  DATE: 01/16/2022 Therapeutic Exercise: Seated shoulder ER with RTB 3x10 BIL Seated shoulder IR with RTB 3x10 BIL Mini-squat side-stepping in // bars x4 laps (not today) Squat in // bars 3x10 DL heel raises in // bars 2x20 Step up 4'' 2x10 ea Seated row GTB - 2x10 Seated shoulder scaption with 1# DB on Lt 3x10 (not today) Manual Therapy: N/A Neuromuscular re-ed: Tandem stance 3x45sec BIL in // bars Standing marching with controlled leg lowering 3x20  Backward walking into PT resistance, corrective balance strategy as PT releases resistance x3 laps in // bars   [facility] Adult PT Treatment:                                                DATE: 01/06/2022 Therapeutic  Exercise: Seated shoulder ER with RTB 2x10 BIL Seated shoulder IR with RTB 2x10 BIL Mini-squat side-stepping in // bars x4 laps Squat in // bars 3x10 DL heel raises in // bars 3x10 Seated shoulder scaption with 1# DB on Lt 3x10 Manual Therapy: N/A Neuromuscular re-ed: Semi-tandem stance 2x30sec BIL in // bars Standing marching with controlled leg lowering 3x20  Backward walking into PT resistance, corrective balance strategy as PT releases resistance x3 laps in // bars Therapeutic Activity: N/A Modalities: N/A Self Care: N/A   [facility] Adult PT Treatment:                                                DATE: 01/02/2022 Issued HEP       PATIENT EDUCATION:  Education details: Pathophysiology of thalamic pain syndrome, prognosis, POC, FOTO, and HEP Person educated: Patient and Caregiver Education method: Explanation, Demonstration, and Handouts Education comprehension: verbalized understanding, returned demonstration, and needs further education     HOME EXERCISE PROGRAM: Access Code: [email or web address] URL: [email or web address] Date: 01/02/2022 Prepared by: Vanessa [place]   Exercises Supine Bridge - 1 x daily - 7 x weekly - 3 sets - 10 reps - 3-sec hold Sidelying Hip Abduction - 1 x daily - 7 x weekly - 2 sets - 10 reps - 3-sec hold Seated Long Arc Quad - 1 x daily - 7 x weekly - 3 sets - 10 reps - 5-sec hold Side to Side Weight Shift with Counter Support - 1 x daily - 7 x weekly - 3 sets - 10 reps Staggered Stance Forward Backward Weight Shift with Counter Support - 1 x daily - 7 x weekly - 3 sets - 10 reps     ASSESSMENT:   CLINICAL IMPRESSION:  [place] is progressing fair with therapy.  Pt reports no increase in baseline pain following therapy.  Today we concentrated on rotator cuff strengthening, periscapular strengthening, and balance/proprioception.  Pt seems to be progressing balance well, advancing to full tandem today.  She must be cued heavily to avoid using  UE for support.  Her shoulder pain is a little higher today and she was unable to complete scaption d/t pain.  She does report a reduction in shoulder pain from 10/10 to 3/10 after therapy.  Pt will continue to benefit from skilled physical therapy to address remaining deficits and achieve listed goals.  Continue per POC.     GOALS: Goals reviewed with patient? Yes   SHORT TERM GOALS:   STG Name Target Date Goal status  1 Pt will report understanding and adherence to her HEP in order to promote independence in the management of her symptoms. Baseline: HEP provided at eval 01/30/2022 INITIAL    LONG TERM GOALS:    LTG Name Target Date Goal status  1 Pt will improve her FOTO score to 51% in order to demonstrate improved functional ability as it relates to her primary impairments. Baseline:44% 02/27/2022 INITIAL  2 Pt will achieve a BERG score of 52 points in order to demonstrate improved functional balance needed to put on pants while standing. Baseline: 41 02/27/2022 INITIAL  3 Pt will achieve BIL global UE and LE strength of 4/5 or greater in order to progress her independent strengthening program with less limitation. Baseline: See MMT chart 02/27/2022 INITIAL  4 Pt will report adherence to an advanced HEP in order to promote further progress toward her functional goals. Baseline: Initial HEP provided at eval 02/27/2022 INITIAL  5 Pt will demonstrate ability to lift 3 pounds overhead with her Lt UE in order to put away dishes with less limitation. Baseline: Pt unable to reach overhead with Lt UE. 02/27/2022 INITIAL    PLAN: PT FREQUENCY: 2x/week   PT DURATION: 8 weeks   PLANNED INTERVENTIONS: Therapeutic exercises, Therapeutic activity, Neuro Muscular re-education, Balance training, Gait training, Patient/Family education, Joint mobilization, Stair training, Aquatic Therapy, Dry Needling, Electrical stimulation, Cryotherapy, Moist heat, Taping, and Manual therapy   PLAN FOR NEXT SESSION:  Progress early balance/ strengthening; introduce desensitization techniques for thalamic pain    Kevan Ny Rulon Abdalla PT 01/16/22 11:31 AM

## 2022-01-17 NOTE — Therapy (Signed)
OUTPATIENT PHYSICAL THERAPY TREATMENT NOTE   Patient Name: Caitlin Harrington MRN: [id number] DOB:Jul 26, 1946, 76 y.o., female Today's Date: 01/20/2022  PCP: Vernie Shanks, MD REFERRING PROVIDER: Vernie Shanks, MD   PT End of Session - 01/20/22 1130     Visit Number 4    Number of Visits 17    Date for PT Re-Evaluation 02/27/22    Authorization Type MCR/ BCBS    Authorization Time Period FOTO v6, v10; KX mod at v15    Progress Note Due on Visit 10    PT Start Time 1130    PT Stop Time 1215    PT Time Calculation (min) 45 min    Equipment Utilized During Treatment Gait belt    Activity Tolerance Patient tolerated treatment well    Behavior During Therapy [facility] for tasks assessed/performed              Past Medical History:  Diagnosis Date   Asthma    COVID-19 12/2019   Depression    Diabetes mellitus without complication ([place])    type 2   Fatigue    Gout    Hypertension    Insomnia    Numbness    Obesity    Paresthesia    Stroke ([place]) 10/2020   Vitamin D deficiency    Past Surgical History:  Procedure Laterality Date   ABDOMINAL HYSTERECTOMY     BUBBLE STUDY  05/06/2021   Procedure: BUBBLE STUDY;  Surgeon: Thayer Headings, MD;  Location: [place];  Service: Cardiovascular;;   cataracts  2018   TEE WITHOUT CARDIOVERSION N/A 05/06/2021   Procedure: TRANSESOPHAGEAL ECHOCARDIOGRAM (TEE);  Surgeon: Acie Fredrickson Wonda Cheng, MD;  Location: [facility] END[facility]COPY;  Service: Cardiovascular;  Laterality: N/A;   Patient Active Problem List   Diagnosis Date Noted   Exposure to intestinal infectious disease 11/03/2021   Hardening of the aorta (main artery of the heart) ([place]) 11/03/2021   Hyperglycemia due to type 2 diabetes mellitus ([place]) 11/03/2021   Memory impairment 11/03/2021   Relationship dysfunction 11/03/2021   Chronic kidney disease, stage 3a ([place]) 05/26/2021   Type 2 diabetes mellitus with other specified complication ([place]) [id number]   Unsteady gait    CVA (cerebral  vascular accident) ([place]) 05/04/2021   Acute CVA (cerebrovascular accident) ([place]) 05/04/2021   Pain in joint involving ankle and foot 11/06/2020   Acute renal insufficiency 11/06/2020   Allergic rhinitis 11/06/2020   Arthropathy 11/06/2020   Benign essential hypertension 11/06/2020   Bilateral carpal tunnel syndrome 11/06/2020   Chronic sinusitis 11/06/2020   Diverticulitis of colon 11/06/2020   Eczema 11/06/2020   Headache 11/06/2020   Long term (current) use of insulin ([place]) [id number]   Acute metabolic encephalopathy [id number]   Noncompliance with treatment 11/06/2020   Obstructive sleep apnea syndrome 11/06/2020   Other specified abnormal findings of blood chemistry 11/06/2020   Paresthesias 11/06/2020   Personal history of transient ischemic attack (TIA), and cerebral infarction without residual deficits 11/06/2020   Rash 11/06/2020   Depression 11/06/2020   Skin sensation disturbance 11/06/2020   Subclinical hypothyroidism 11/06/2020   Tick bite 11/06/2020   Tiredness 11/06/2020   Unspecified abnormal finding in specimens from other organs, systems and tissues 11/06/2020   Cerebral thrombosis with cerebral infarction 10/29/2020   Major depressive disorder, recurrent episode, moderate ([place]) 10/27/2020   Allergic reaction 10/26/2020   AKI (acute kidney injury) ([place]) 10/26/2020   Thrush 10/26/2020   Urinary tract infection without hematuria 05/12/2020   Encephalopathy 05/12/2020  Altered mental status    Hyperglycemia    Acute on chronic respiratory failure with hypoxia ([place]) 01/03/2020   COVID-19 12/30/2019   Hyperuricemia    Hypertension 02/18/2011   Prolonged depressive adjustment reaction 02/18/2011   Obesity 02/18/2011   Asthma, mild persistent 02/18/2011   Vitamin D deficiency 02/18/2011   Hyperlipidemia, unspecified 02/18/2011   Diabetes mellitus type 2, controlled, without complications ([place]) [id number]   Insomnia 02/18/2011   Stress at work 02/18/2011     REFERRING DIAG: for decondition physically and h/o CVA with leg weakness and pain, arm pain  THERAPY DIAG:  Difficulty in walking, not elsewhere classified  Unsteadiness on feet  Muscle weakness (generalized)  Pain in left upper arm  Pain in left leg  PERTINENT HISTORY: Thalamic stroke June 2022, DMII, HTN, hardening or aorta  PRECAUTIONS: Falls  SUBJECTIVE:  Pt reports continued high level pain in her Lt shoulder/ upper arm. She reports she has trouble sleeping at night due to her pain. She reports that she has been doing some of her home exercises every day.  PAIN:  Are you having pain? Yes NPRS scale: 10/10 Pain location: Lt upper arm    OBJECTIVE:    DIAGNOSTIC FINDINGS: 05/25/2021: CT Head without contrast: IMPRESSION: 1. No acute intracranial abnormality. 2. Expected evolution of right thalamic infarct from prior exam. Additional small infarcts that were acute on prior MRI have no definite CT correlate. 3. Background atrophy and chronic small vessel ischemia.   PATIENT SURVEYS:  FOTO 44%, predicted 51% in 13 visits   COGNITION:          Overall cognitive status: History of cognitive impairments - at baseline; diminished short-term memory, flat affect                            SENSATION:          Light touch: Appears intact BIL UE and LE   MUSCLE LENGTH: Thomas test: WNL BIL   POSTURE:  BIL genu valgum, forward posture   PALPATION: TTP to Lt lateral upper arm and Lt anterior thigh     LE MMT:   MMT Right 01/02/2022 Left 01/02/2022 Right 01/10/2022 Left 01/10/2022  Hip flexion 4+/5 3/5 5/5 4/5  Hip extension 3/5 2/5 3/5 3/5  Hip abduction 3/5 2/5 3+/5 3/5  Knee flexion 5/5 4/5  5/5  Knee extension 5/5 4/5  5/5  Ankle dorsiflexion 5/5 4/5    Ankle plantarflexion 5/5 5/5     (Blank rows = not tested)   UE MMT:   MMT Right 01/02/2022 Left 01/02/2022 Right 01/10/2022 Left 01/10/2022  Shoulder flexion 4/5 3/5 4+/5 3+/5  Shoulder abduction 3+/5  2+/5 4/5 3+/5  Shoulder ER 4+/5 2+/5 5/5 3+/5  Shoulder IR 4/5 3/5 5/5 3+/5  Elbow flexion 5/5 4/5  4/5  Elbow extension 5/5 4/5  4/5  Wrist flexion 5/5 4/5  4/5  Wrist extension 5/5 4/5  4/5   (Blank rows = not tested)   UE ROM: Lt shoulder abduction and flexion limited to roughly 100 degrees   FUNCTIONAL TESTS:  BERG BALANCE TEST Sitting to Standing: 3.      Stands independently using hands Standing Unsupported: 4.      Stands safely for 2 minutes Sitting Unsupported: 4.     Sits for 2 minutes independently Standing to Sitting: 3.     Controls descent with hands  Transfers: 3.     Transfers safely definite  use of hands Standing with eyes closed: 4.     Stands safely for 10 seconds  Standing with feet together: 3.     Stands for 1 minute with supervision Reaching forward with outstretched arm: 3.     Reaches forward 5 inches Retrieving object from the floor: 3.     Able to pick up with supervision Turning to look behind: 4.     Looks behind from both sides and weight shifts well Turning 360 degrees: 4.     Able to turn in </=4 seconds  Place alternate foot on stool: 3.     Completes 8 steps in >20 seconds Standing with one foot in front: 0.     Loses balance while standing/stepping Standing on one foot: 0.     Unable   Total Score: 41/56     GAIT: Distance walked: 20 feet Assistive device utilized: None Level of assistance: Complete Independence Comments: Increased BIL genu valgum, decreased swing BIL, midfoot strike       TODAY'S TREATMENT:  [facility] Adult PT Treatment:                                                DATE: 01/20/2022 Therapeutic Exercise: Seated biceps curls with 4# dumbbells 3x10 BIL Seated shoulder ER with GTB 3x10 BIL Seated shoulder IR with GTB 3x10 BIL Seated row GTB 2x10 Seated shoulder scaption with 1# DB on Lt 3x10 Squat 3x10 Heel raises on edge of Airex pad with slow, eccentric lowering 3x15 Manual Therapy: N/A Neuromuscular re-ed: High  marching Airex pad in // bars 2x10 BIL Standing boxing with reaching outside of BOS to moving target 2x3 minutes in traditional stance, 2x3 minutes in south paw Staggered stance forward/ backward weight shifting 2x30sec BIL Therapeutic Activity: N/A Modalities: N/A Self Care: N/A   Treatment:  DATE: 01/16/2022 Therapeutic Exercise: Seated shoulder ER with RTB 3x10 BIL Seated shoulder IR with RTB 3x10 BIL Mini-squat side-stepping in // bars x4 laps (not today) Squat in // bars 3x10 DL heel raises in // bars 2x20 Step up 4'' 2x10 ea Seated row GTB - 2x10 Seated shoulder scaption with 1# DB on Lt 3x10 (not today) Manual Therapy: N/A Neuromuscular re-ed: Tandem stance 3x45sec BIL in // bars Standing marching with controlled leg lowering 3x20  Backward walking into PT resistance, corrective balance strategy as PT releases resistance x3 laps in // bars   [facility] Adult PT Treatment:                                                DATE: 01/06/2022 Therapeutic Exercise: Seated shoulder ER with RTB 2x10 BIL Seated shoulder IR with RTB 2x10 BIL Mini-squat side-stepping in // bars x4 laps Squat in // bars 3x10 DL heel raises in // bars 3x10 Seated shoulder scaption with 1# DB on Lt 3x10 Manual Therapy: N/A Neuromuscular re-ed: Semi-tandem stance 2x30sec BIL in // bars Standing marching with controlled leg lowering 3x20  Backward walking into PT resistance, corrective balance strategy as PT releases resistance x3 laps in // bars Therapeutic Activity: N/A Modalities: N/A Self Care: N/A   [facility] Adult PT Treatment:  DATE: 01/02/2022 Issued HEP       PATIENT EDUCATION:  Education details: Educated about importance of daily adherence to her full HEP Person educated: Patient and Armed forces training and education officer method: Explanation, Media planner, and Handouts Education comprehension: verbalized understanding, returned demonstration, and needs further  education     HOME EXERCISE PROGRAM: Access Code: [email or web address] URL: [email or web address] Date: 01/02/2022 Prepared by: Vanessa [place]   Exercises Supine Bridge - 1 x daily - 7 x weekly - 3 sets - 10 reps - 3-sec hold Sidelying Hip Abduction - 1 x daily - 7 x weekly - 2 sets - 10 reps - 3-sec hold Seated Long Arc Quad - 1 x daily - 7 x weekly - 3 sets - 10 reps - 5-sec hold Side to Side Weight Shift with Counter Support - 1 x daily - 7 x weekly - 3 sets - 10 reps Staggered Stance Forward Backward Weight Shift with Counter Support - 1 x daily - 7 x weekly - 3 sets - 10 reps     ASSESSMENT:   CLINICAL IMPRESSION: Pt responded well to al interventions today, demonstrating good form and no increase in pain with selected exercises. Re-assessment of global strength shows a good trend toward improved Lt sided strength since starting PT. She will continue to benefit from skilled PT to address her primary impairments and return to her prior level of function with less limitation.     GOALS: Goals reviewed with patient? Yes   SHORT TERM GOALS:   STG Name Target Date Goal status  1 Pt will report understanding and adherence to her HEP in order to promote independence in the management of her symptoms. Baseline: HEP provided at eval 01/20/2022: Pt reports daily adherence to her HEP 01/30/2022 ACHIEVED    LONG TERM GOALS:    LTG Name Target Date Goal status  1 Pt will improve her FOTO score to 51% in order to demonstrate improved functional ability as it relates to her primary impairments. Baseline:44% 02/27/2022 INITIAL  2 Pt will achieve a BERG score of 52 points in order to demonstrate improved functional balance needed to put on pants while standing. Baseline: 41 02/27/2022 INITIAL  3 Pt will achieve BIL global UE and LE strength of 4/5 or greater in order to progress her independent strengthening program with less limitation. Baseline: See MMT chart 01/20/2022: See updated MMT  Chart 02/27/2022 PROGRESSING  4 Pt will report adherence to an advanced HEP in order to promote further progress toward her functional goals. Baseline: Initial HEP provided at eval 02/27/2022 INITIAL  5 Pt will demonstrate ability to lift 3 pounds overhead with her Lt UE in order to put away dishes with less limitation. Baseline: Pt unable to reach overhead with Lt UE. 02/27/2022 INITIAL    PLAN: PT FREQUENCY: 2x/week   PT DURATION: 8 weeks   PLANNED INTERVENTIONS: Therapeutic exercises, Therapeutic activity, Neuro Muscular re-education, Balance training, Gait training, Patient/Family education, Joint mobilization, Stair training, Aquatic Therapy, Dry Needling, Electrical stimulation, Cryotherapy, Moist heat, Taping, and Manual therapy   PLAN FOR NEXT SESSION: Progress early balance/ strengthening; introduce desensitization techniques for thalamic pain    Vanessa [place], PT, DPT 01/20/22 12:13 PM

## 2022-01-20 ENCOUNTER — Other Ambulatory Visit: Payer: Self-pay

## 2022-01-20 ENCOUNTER — Ambulatory Visit: Payer: Medicare Other

## 2022-01-20 DIAGNOSIS — M6281 Muscle weakness (generalized): Secondary | ICD-10-CM | POA: Diagnosis not present

## 2022-01-20 DIAGNOSIS — M79622 Pain in left upper arm: Secondary | ICD-10-CM | POA: Diagnosis not present

## 2022-01-20 DIAGNOSIS — R2681 Unsteadiness on feet: Secondary | ICD-10-CM | POA: Diagnosis not present

## 2022-01-20 DIAGNOSIS — R262 Difficulty in walking, not elsewhere classified: Secondary | ICD-10-CM

## 2022-01-20 DIAGNOSIS — M79605 Pain in left leg: Secondary | ICD-10-CM

## 2022-01-23 ENCOUNTER — Ambulatory Visit: Payer: Medicare Other | Attending: Family Medicine | Admitting: Physical Therapy

## 2022-01-23 ENCOUNTER — Other Ambulatory Visit: Payer: Self-pay

## 2022-01-23 ENCOUNTER — Encounter: Payer: Self-pay | Admitting: Physical Therapy

## 2022-01-23 DIAGNOSIS — M79622 Pain in left upper arm: Secondary | ICD-10-CM | POA: Diagnosis not present

## 2022-01-23 DIAGNOSIS — M6281 Muscle weakness (generalized): Secondary | ICD-10-CM | POA: Insufficient documentation

## 2022-01-23 DIAGNOSIS — R262 Difficulty in walking, not elsewhere classified: Secondary | ICD-10-CM | POA: Diagnosis not present

## 2022-01-23 DIAGNOSIS — R2681 Unsteadiness on feet: Secondary | ICD-10-CM | POA: Insufficient documentation

## 2022-01-23 NOTE — Therapy (Signed)
OUTPATIENT PHYSICAL THERAPY TREATMENT NOTE   Patient Name: Caitlin Harrington MRN: [id number] DOB:10/28/1946, 76 y.o., female Today's Date: 01/23/2022  PCP: Vernie Shanks, MD REFERRING PROVIDER: Vernie Shanks, MD   PT End of Session - 01/23/22 1130     Visit Number 5    Number of Visits 17    Date for PT Re-Evaluation 02/27/22    Authorization Type MCR/ BCBS    Authorization Time Period FOTO v6, v10; KX mod at v15    Progress Note Due on Visit 10    PT Start Time 1130    PT Stop Time 1211    PT Time Calculation (min) 41 min    Equipment Utilized During Treatment Gait belt    Activity Tolerance Patient tolerated treatment well    Behavior During Therapy [facility] for tasks assessed/performed              Past Medical History:  Diagnosis Date   Asthma    COVID-19 12/2019   Depression    Diabetes mellitus without complication ([place])    type 2   Fatigue    Gout    Hypertension    Insomnia    Numbness    Obesity    Paresthesia    Stroke ([place]) 10/2020   Vitamin D deficiency    Past Surgical History:  Procedure Laterality Date   ABDOMINAL HYSTERECTOMY     BUBBLE STUDY  05/06/2021   Procedure: BUBBLE STUDY;  Surgeon: Thayer Headings, MD;  Location: [place];  Service: Cardiovascular;;   cataracts  2018   TEE WITHOUT CARDIOVERSION N/A 05/06/2021   Procedure: TRANSESOPHAGEAL ECHOCARDIOGRAM (TEE);  Surgeon: Acie Fredrickson Wonda Cheng, MD;  Location: [facility] ENDOSCOPY;  Service: Cardiovascular;  Laterality: N/A;   Patient Active Problem List   Diagnosis Date Noted   Exposure to intestinal infectious disease 11/03/2021   Hardening of the aorta (main artery of the heart) ([place]) 11/03/2021   Hyperglycemia due to type 2 diabetes mellitus ([place]) 11/03/2021   Memory impairment 11/03/2021   Relationship dysfunction 11/03/2021   Chronic kidney disease, stage 3a ([place]) 05/26/2021   Type 2 diabetes mellitus with other specified complication ([place]) [id number]   Unsteady gait    CVA (cerebral  vascular accident) ([place]) 05/04/2021   Acute CVA (cerebrovascular accident) ([place]) 05/04/2021   Pain in joint involving ankle and foot 11/06/2020   Acute renal insufficiency 11/06/2020   Allergic rhinitis 11/06/2020   Arthropathy 11/06/2020   Benign essential hypertension 11/06/2020   Bilateral carpal tunnel syndrome 11/06/2020   Chronic sinusitis 11/06/2020   Diverticulitis of colon 11/06/2020   Eczema 11/06/2020   Headache 11/06/2020   Long term (current) use of insulin ([place]) [id number]   Acute metabolic encephalopathy [id number]   Noncompliance with treatment 11/06/2020   Obstructive sleep apnea syndrome 11/06/2020   Other specified abnormal findings of blood chemistry 11/06/2020   Paresthesias 11/06/2020   Personal history of transient ischemic attack (TIA), and cerebral infarction without residual deficits 11/06/2020   Rash 11/06/2020   Depression 11/06/2020   Skin sensation disturbance 11/06/2020   Subclinical hypothyroidism 11/06/2020   Tick bite 11/06/2020   Tiredness 11/06/2020   Unspecified abnormal finding in specimens from other organs, systems and tissues 11/06/2020   Cerebral thrombosis with cerebral infarction 10/29/2020   Major depressive disorder, recurrent episode, moderate ([place]) 10/27/2020   Allergic reaction 10/26/2020   AKI (acute kidney injury) ([place]) 10/26/2020   Thrush 10/26/2020   Urinary tract infection without hematuria 05/12/2020   Encephalopathy 05/12/2020  Altered mental status    Hyperglycemia    Acute on chronic respiratory failure with hypoxia ([place]) 01/03/2020   COVID-19 12/30/2019   Hyperuricemia    Hypertension 02/18/2011   Prolonged depressive adjustment reaction 02/18/2011   Obesity 02/18/2011   Asthma, mild persistent 02/18/2011   Vitamin D deficiency 02/18/2011   Hyperlipidemia, unspecified 02/18/2011   Diabetes mellitus type 2, controlled, without complications ([place]) [id number]   Insomnia 02/18/2011   Stress at work 02/18/2011     REFERRING DIAG: for decondition physically and h/o CVA with leg weakness and pain, arm pain  THERAPY DIAG:  Difficulty in walking, not elsewhere classified  Unsteadiness on feet  Muscle weakness (generalized)  PERTINENT HISTORY: Thalamic stroke June 2022, DMII, HTN, hardening or aorta  PRECAUTIONS: Falls  SUBJECTIVE:  Pt reports that her shoulder hurts intermittently with activity.  It does not hurt now but can reach a 10/10.  PAIN:  Are you having pain? Yes NPRS scale: 10/10 Pain location: Lt upper arm    OBJECTIVE:    DIAGNOSTIC FINDINGS: 05/25/2021: CT Head without contrast: IMPRESSION: 1. No acute intracranial abnormality. 2. Expected evolution of right thalamic infarct from prior exam. Additional small infarcts that were acute on prior MRI have no definite CT correlate. 3. Background atrophy and chronic small vessel ischemia.   PATIENT SURVEYS:  FOTO 44%, predicted 51% in 13 visits   COGNITION:          Overall cognitive status: History of cognitive impairments - at baseline; diminished short-term memory, flat affect                            SENSATION:          Light touch: Appears intact BIL UE and LE   MUSCLE LENGTH: Thomas test: WNL BIL   POSTURE:  BIL genu valgum, forward posture   PALPATION: TTP to Lt lateral upper arm and Lt anterior thigh     LE MMT:   MMT Right 01/02/2022 Left 01/02/2022 Right 01/10/2022 Left 01/10/2022  Hip flexion 4+/5 3/5 5/5 4/5  Hip extension 3/5 2/5 3/5 3/5  Hip abduction 3/5 2/5 3+/5 3/5  Knee flexion 5/5 4/5  5/5  Knee extension 5/5 4/5  5/5  Ankle dorsiflexion 5/5 4/5    Ankle plantarflexion 5/5 5/5     (Blank rows = not tested)   UE MMT:   MMT Right 01/02/2022 Left 01/02/2022 Right 01/10/2022 Left 01/10/2022  Shoulder flexion 4/5 3/5 4+/5 3+/5  Shoulder abduction 3+/5 2+/5 4/5 3+/5  Shoulder ER 4+/5 2+/5 5/5 3+/5  Shoulder IR 4/5 3/5 5/5 3+/5  Elbow flexion 5/5 4/5  4/5  Elbow extension 5/5 4/5  4/5   Wrist flexion 5/5 4/5  4/5  Wrist extension 5/5 4/5  4/5   (Blank rows = not tested)   UE ROM: Lt shoulder abduction and flexion limited to roughly 100 degrees   FUNCTIONAL TESTS:  BERG BALANCE TEST Sitting to Standing: 3.      Stands independently using hands Standing Unsupported: 4.      Stands safely for 2 minutes Sitting Unsupported: 4.     Sits for 2 minutes independently Standing to Sitting: 3.     Controls descent with hands  Transfers: 3.     Transfers safely definite use of hands Standing with eyes closed: 4.     Stands safely for 10 seconds  Standing with feet together: 3.     Stands for  1 minute with supervision Reaching forward with outstretched arm: 3.     Reaches forward 5 inches Retrieving object from the floor: 3.     Able to pick up with supervision Turning to look behind: 4.     Looks behind from both sides and weight shifts well Turning 360 degrees: 4.     Able to turn in </=4 seconds  Place alternate foot on stool: 3.     Completes 8 steps in >20 seconds Standing with one foot in front: 0.     Loses balance while standing/stepping Standing on one foot: 0.     Unable   Total Score: 41/56     GAIT: Distance walked: 20 feet Assistive device utilized: None Level of assistance: Complete Independence Comments: Increased BIL genu valgum, decreased swing BIL, midfoot strike       TODAY'S TREATMENT:  [facility] Adult PT Treatment:                                                DATE: 01/23/2022 Therapeutic Exercise: Seated biceps curls with 6# dumbbells 3x10 BIL Seated shoulder ER with GTB 3x10 BIL Seated shoulder IR with GTB 3x10 BIL Seated row GTB 2x10 Seated shoulder scaption with 1# DB on Lt 3x10 Heel raises 3x15 Sit to stand from blue chair - 3x10 Manual Therapy: N/A Neuromuscular re-ed: Tandem stance 3x45sec BIL  Standing marching with controlled leg lowering 3x20  Tandem walking with CGA  [facility] Adult PT Treatment:                                                 DATE: 01/20/2022 Therapeutic Exercise: Seated biceps curls with 4# dumbbells 3x10 BIL Seated shoulder ER with GTB 3x10 BIL Seated shoulder IR with GTB 3x10 BIL Seated row GTB 2x10 Seated shoulder scaption with 1# DB on Lt 3x10 Squat 3x10 Heel raises on edge of Airex pad with slow, eccentric lowering 3x15 Manual Therapy: N/A Neuromuscular re-ed: High marching Airex pad in // bars 2x10 BIL Standing boxing with reaching outside of BOS to moving target 2x3 minutes in traditional stance, 2x3 minutes in Kirk paw Staggered stance forward/ backward weight shifting 2x30sec BIL Therapeutic Activity: N/A Modalities: N/A Self Care: N/A   Treatment:  DATE: 01/16/2022 Therapeutic Exercise: Seated shoulder ER with RTB 3x10 BIL Seated shoulder IR with RTB 3x10 BIL Mini-squat side-stepping in // bars x4 laps (not today) Squat in // bars 3x10 DL heel raises in // bars 2x20 Step up 4'' 2x10 ea Seated row GTB - 2x10 Seated shoulder scaption with 1# DB on Lt 3x10 (not today) Manual Therapy: N/A Neuromuscular re-ed: Tandem stance 3x45sec BIL in // bars Standing marching with controlled leg lowering 3x20  Backward walking into PT resistance, corrective balance strategy as PT releases resistance x3 laps in // bars   [facility] Adult PT Treatment:                                                DATE: 01/06/2022 Therapeutic Exercise: Seated shoulder ER with RTB 2x10 BIL Seated shoulder IR with RTB 2x10  BIL Mini-squat side-stepping in // bars x4 laps Squat in // bars 3x10 DL heel raises in // bars 3x10 Seated shoulder scaption with 1# DB on Lt 3x10 Manual Therapy: N/A Neuromuscular re-ed: Semi-tandem stance 2x30sec BIL in // bars Standing marching with controlled leg lowering 3x20  Backward walking into PT resistance, corrective balance strategy as PT releases resistance x3 laps in // bars Therapeutic Activity: N/A Modalities: N/A Self Care: N/A   [facility] Adult PT Treatment:                                                 DATE: 01/02/2022 Issued HEP         HOME EXERCISE PROGRAM: Access Code: [email or web address] URL: [email or web address] Date: 01/02/2022 Prepared by: Vanessa Mehama   Exercises Supine Bridge - 1 x daily - 7 x weekly - 3 sets - 10 reps - 3-sec hold Sidelying Hip Abduction - 1 x daily - 7 x weekly - 2 sets - 10 reps - 3-sec hold Seated Long Arc Quad - 1 x daily - 7 x weekly - 3 sets - 10 reps - 5-sec hold Side to Side Weight Shift with Counter Support - 1 x daily - 7 x weekly - 3 sets - 10 reps Staggered Stance Forward Backward Weight Shift with Counter Support - 1 x daily - 7 x weekly - 3 sets - 10 reps     ASSESSMENT:   CLINICAL IMPRESSION: Eliani is progressing well with therapy.  Pt reports no increase in baseline pain following therapy.  Today we concentrated on rotator cuff strengthening, periscapular strengthening, and balance/proprioception.  Pt with lower reported shoulder pain today.  We completed balance exercises outside the // today to reduce over use of UE support to good effect.  CGA provided throughout.  Objective improvement in shoulder strength and pain reduction in session.  Pt will continue to benefit from skilled physical therapy to address remaining deficits and achieve listed goals.  Continue per POC.     GOALS: Goals reviewed with patient? Yes   SHORT TERM GOALS:   STG Name Target Date Goal status  1 Pt will report understanding and adherence to her HEP in order to promote independence in the management of her symptoms. Baseline: HEP provided at eval 01/20/2022: Pt reports daily adherence to her HEP 01/30/2022 ACHIEVED    LONG TERM GOALS:    LTG Name Target Date Goal status  1 Pt will improve her FOTO score to 51% in order to demonstrate improved functional ability as it relates to her primary impairments. Baseline:44% 02/27/2022 INITIAL  2 Pt will achieve a BERG score of 52 points in order to demonstrate improved  functional balance needed to put on pants while standing. Baseline: 41 02/27/2022 INITIAL  3 Pt will achieve BIL global UE and LE strength of 4/5 or greater in order to progress her independent strengthening program with less limitation. Baseline: See MMT chart 01/20/2022: See updated MMT Chart 02/27/2022 PROGRESSING  4 Pt will report adherence to an advanced HEP in order to promote further progress toward her functional goals. Baseline: Initial HEP provided at eval 02/27/2022 INITIAL  5 Pt will demonstrate ability to lift 3 pounds overhead with her Lt UE in order to put away dishes with less limitation. Baseline: Pt unable to reach overhead with Lt UE. 02/27/2022 INITIAL    PLAN: PT  FREQUENCY: 2x/week   PT DURATION: 8 weeks   PLANNED INTERVENTIONS: Therapeutic exercises, Therapeutic activity, Neuro Muscular re-education, Balance training, Gait training, Patient/Family education, Joint mobilization, Stair training, Aquatic Therapy, Dry Needling, Electrical stimulation, Cryotherapy, Moist heat, Taping, and Manual therapy   PLAN FOR NEXT SESSION: Progress early balance/ strengthening; introduce desensitization techniques for thalamic pain    Kevan Ny Yashvi Jasinski PT 01/23/22 12:12 PM

## 2022-01-27 ENCOUNTER — Other Ambulatory Visit: Payer: Self-pay | Admitting: *Deleted

## 2022-01-27 NOTE — Patient Outreach (Signed)
[facility] Customer service manager [facility]) Care Management ? ?01/27/2022 ? ?Angela Cox ?[birth year] ?[id number] ? ? ?[facility] Unsuccessful outreach ? ? ?Outreach attempt to the listed at the preferred outreach number in EPIC [phone] ?No answer. [facility] RN CM left HIPAA [facility] Portability and Accountability Act) compliant voicemail message along with CM?s contact info.  ? ?Plan: ?[facility] RN CM scheduled this patient for another call attempt within 4-7 business days ?Unsuccessful outreach on 01/27/22 ? ? ?Remmi Armenteros L. [name], RN, BSN, CCM ?[facility] Telephonic Care Management Care Coordinator ?Office number [phone] ? ?

## 2022-01-28 ENCOUNTER — Other Ambulatory Visit: Payer: Self-pay | Admitting: *Deleted

## 2022-01-28 NOTE — Patient Outreach (Signed)
[facility]) Care Management Telephonic RN Care Manager Note   01/28/2022 Name:  Caitlin Harrington MRN:  [id number] DOB:  Feb 16, 1946  Summary: Incoming call from Mr Caitlin Harrington  He is at work driving He Electrical engineer for missing RN CM outreach He updates RN CM that the patient continues with outpatient rehab in [place] but is having difficulty with her arm He inquires about benefit for medical transportation to assist pt with upcoming therapy and pcp visits He reports not having the time today to get set up with her insurance transportation benefit  He states the patient was noted to be calling him and therefore needed to conclude the call  Recommendations/Changes made from today's visit: Received incoming call from Mr Caitlin Harrington Assessed for patient worsening symptoms, care coordination needs Discussed how general transportation insurance benefits works by [employer] to the customer service to obtain the vendor, getting set up for service and the requirements Offered to assist with the initiation of the transportation benefit but he reports he does not have the time today as RN CM inquired if pt able to independently attend appointment without assistance Call concluded   Subjective: Caitlin Harrington is an 76 y.o. year old female who is a primary patient of [name], [name], MD. The care management team was consulted for assistance with care management and/or care coordination needs.    Telephonic RN Care Manager completed Telephone Visit today.   Objective:  Medications Reviewed Today     Reviewed by [name], PT (Physical Therapist) on 01/23/22 at 87  Med List Status: <None>   Medication Order Taking? Sig Documenting Provider Last Dose Status Informant  albuterol (VENTOLIN HFA) 108 (90 Base) MCG/ACT inhaler [id number] No Inhale 2 puffs into the lungs 2 (two) times daily as needed for shortness of breath or wheezing.  Patient not taking: Reported on 01/02/2022    [provider] Not Taking Active Family Member  allopurinol (ZYLOPRIM) 300 MG tablet [id number] No Take 300 mg by mouth daily. [provider] Taking Active Family Member           Med Note [name] May 25, 2021  8:01 PM)    amLODipine (NORVASC) 5 MG tablet [id number] No Take 1 tablet (5 mg total) by mouth daily. [name], MD Taking Active   aspirin EC 81 MG EC tablet [id number] No Take 1 tablet (81 mg total) by mouth daily. Swallow whole. [name], MD Taking Active Family Member  blood glucose meter kit and supplies KIT [id number] No Dispense based on patient and insurance preference. Use up to four times daily as directed. (FOR ICD-9 250.00, 250.01). [name], MD Taking Active Family Member  Blood Pressure Monitoring (BLOOD PRESSURE KIT) KIT [id number] No See admin instructions. [provider] Taking Active Family Member  carvedilol (COREG) 6.25 MG tablet [id number] No Take 6.25 mg by mouth 2 (two) times daily with a meal. [provider] Taking Active Family Member           Med Note [name] May 25, 2021  7:53 PM)    clopidogrel (PLAVIX) 75 MG tablet [id number] No Take 1 tablet (75 mg total) by mouth daily. [name], MD Taking Active   EUTHYROX 25 MCG tablet [id number] No Take 25 mcg by mouth daily. [provider] Taking Active Family Member  hydrOXYzine (ATARAX/VISTARIL) 25 MG tablet [id number] No Take 25 mg by mouth daily as needed for anxiety. [provider]  Taking Active Family Member           Med Note [name] Aug 25, 2021 10:55 AM) 08/25/21 taking one tab at bedtime  insulin glargine (LANTUS) 100 UNIT/ML Solostar Pen [id number] No Inject 15 Units into the skin daily. [provider] Taking Active Family Member           Med Note [name]   Wed Jul 30, 2021 12:29 AM) 07/29/21 pcp office pharmacy reported pt lantus increased to 20 unit on 07/11/21 office visit   Insulin  Pen Needle (NOVOFINE) 30G X 8 MM MISC [id number] No Inject 10 each into the skin as directed. [name], MD Taking Active Family Member  losartan (COZAAR) 25 MG tablet [id number] No Take 1 tablet (25 mg total) by mouth daily. [name], MD Taking Active   metFORMIN (GLUCOPHAGE) 500 MG tablet [id number] No Take 500 mg by mouth daily. [provider] Taking Active Family Member  PARoxetine (PAXIL) 40 MG tablet [id number] No Take 40 mg by mouth every morning. [provider] Taking Active Family Member  rosuvastatin (CRESTOR) 20 MG tablet [id number] No Take 1 tablet (20 mg total) by mouth daily. Pokhrel, [name], MD Taking Active Family Member  sertraline (ZOLOFT) 50 MG tablet [id number] No Take 50 mg by mouth daily. [provider] Taking Active   sitaGLIPtin (JANUVIA) 100 MG tablet [id number] No Take 100 mg by mouth daily. [provider] Taking Active Family Member           Med Note [name] May 25, 2021  7:44 PM)    terazosin (HYTRIN) 5 MG capsule [id number] No Take 5 mg by mouth daily. [provider] Taking Active Family Member  UNABLE TO FIND [id number] No 1 tablet daily. Med Name: centrum gummies [provider] Taking Active   Med List Note [name] 05/05/21 [id number]): [id number]             SDOH:  (Social Determinants of Health) assessments and interventions performed:    Care Plan  Review of patient past medical history, allergies, medications, health status, including review of consultants reports, laboratory and other test data, was performed as part of comprehensive evaluation for care management services.   Care Plan : RN Care Manager Plan of Care  Updates made by [name], RN since 01/28/2022 12:00 AM     Problem: Complex Care Coordination Needs and disease management in patient with DM   Priority: High     Long-Range Goal: Establish Plan of Care for Management Complex SDOH  Barriers, disease management and Care Coordination Needs in patient with DM   Start Date: 10/22/2021  This Visit's Progress: On track  Recent Progress: On track  Priority: High  Note:   Current Barriers:  Knowledge Deficits related to plan of care for management of DMII and mobility/debility  Care Coordination needs related to Lacks knowledge of community resource: medication management, medicare enrollment annual changes, pcp pharmacist Pt lives with Mr Gigi Gin who assists with the management of her iADLs to include obtaining her medicines and medical supplies 01/27/22 Unsuccessful outreach, message left 01/28/22 Caregiver Mr Caitlin Harrington returned a call  RN CM Clinical Goal(s):  Patient/caregiver will verbalize understanding of plan for management of DMII as evidenced by improved HgA1c/home management  through collaboration with RN Care manager, provider, and care team.   Interventions: Continue follow up outreaches to assess changes of any care coordination or  disease management/education need Inter-disciplinary care team collaboration (see longitudinal plan of care) Evaluation of current treatment plan related to  self management and patient's adherence to plan as established by provider 12/16/21 Reviewed process of facility and outpatient therapy medicare guidelines and how to obtain community level orders for services Discussed patient may need to be returned to pcp for evaluation if symptoms have worsen since last evaluation or to met medicare guidelines Assessed for any concerns with medications since mail order services initiated  Assessed home care of diabetes Outreach to primary care provider (PCP) office [phone] Thayer Headings about Patient interest in outpatient physical therapy who sent a note to pcp RN  12/26/21 Follow up to check on outpatient therapies Mr Caitlin Harrington reports services are being worked on. They are at a store at the time of the outreach. Mr Caitlin Harrington was requested to return a  call to RN CM   Diabetes Interventions:  (Status:  Condition stable.  Not addressed this visit.) Long Term Goal Assessed patient's understanding of A1c goal: <7% Reviewed medications with patient and discussed importance of medication adherence Follow up on patient recent pcp visit with anticipated meeting with pcp pharmacist and medicare enrollment changes Messaged Mr Ree Shay [phone] for an possible update on any medicare active enrollment changes for patient 11/03/21 reviewed with Mr Caitlin Harrington the outreach from Mr Minta Balsam and attempted outreach to [employer] office pharmacy[facility] staff with voice message left Allowed him time to ventilate his feelings  Outreached as a conference call to [place] program [phone] Allowed pt/caregiver to ask all questions and ventilate to [facility] pharmacy staff member, Alvester Chou briefly with Ms Nova Schmuhl to pcp office, spoke with Corey Skains to leave a message for pcp nurse related to request for assist with new prescriptions for FEP mail order services, as many 3 month supply medicines and diabetic supplies as possible Left fax number [phone], call in number [phone] and electronic -[facility] mail order info Incoming call from [place] pharmacist to review pt request for mail order. RN CM answered pharmacist's questions. Updated Mr Caitlin Harrington via Text as requested that the pcp office staff had responded and is sending the prescriptions 12/16/21 improved  Recommendations/Changes made from today's visit: Change patient to mail order services via her insurance FEP pharmacy benefit Outreach to pcp office to follow up on request for new prescriptions for mail order services provided mail order service number [phone] fax [phone] Lab Results  Component Value Date   HGBA1C 10.7 (H) 05/03/2021   Mobility/debility  (Status:  Goal on track:  Yes.)  Short Term Goal Evaluation of current treatment plan related to  mobility/debility ,  Transportation and Lacks knowledge of community resource: outpatient therapy, medical transportation  self-management and patient's adherence to plan as established by provider. Discussed plans with patient for ongoing care management follow up and provided patient with direct contact information for care management team Provided education to patient re: outpatient therapy, medical transportation Collaborated with insurance staff, pcp staff, caregiver regarding outpatient therapy, medical transportation Provided patient and/or caregiver with outpatient therapy, medical transportation information about outpatient therapy, medical transportation Advice worker) Discussed plans with patient for ongoing care management follow up and provided patient with direct contact information for care management team  Patient Goals/Self-Care Activities: Take all medications as prescribed Attend all scheduled provider appointments Call pharmacy for medication refills 3-7 days in advance of running out of medications Perform  all self care activities independently  Perform IADL's (shopping, preparing meals, housekeeping, managing finances) independently Follow up with medicare enrollment staff from [place] and pcp office pharmacy staff for medication management   Follow Up Plan:  The patient has been provided with contact information for the care management team and has been advised to call with any health related questions or concerns.  The care management team will reach out to the patient again over the next 30+ business  days.       Plan: The patient has been provided with contact information for the care management team and has been advised to call with any health related questions or concerns.  The care management team will reach out to the patient again over the next 30+ business days.  Saachi Zale L. Lavina Hamman, RN, BSN, [place] Coordinator Office number [phone] [facility] number [phone] Fax number [phone]

## 2022-01-30 ENCOUNTER — Other Ambulatory Visit: Payer: Self-pay

## 2022-01-30 ENCOUNTER — Ambulatory Visit: Payer: Medicare Other | Admitting: Physical Therapy

## 2022-01-30 ENCOUNTER — Encounter: Payer: Self-pay | Admitting: Physical Therapy

## 2022-01-30 DIAGNOSIS — M79622 Pain in left upper arm: Secondary | ICD-10-CM | POA: Diagnosis not present

## 2022-01-30 DIAGNOSIS — R2681 Unsteadiness on feet: Secondary | ICD-10-CM | POA: Diagnosis not present

## 2022-01-30 DIAGNOSIS — M6281 Muscle weakness (generalized): Secondary | ICD-10-CM | POA: Diagnosis not present

## 2022-01-30 DIAGNOSIS — R262 Difficulty in walking, not elsewhere classified: Secondary | ICD-10-CM

## 2022-01-30 NOTE — Patient Instructions (Signed)
Aquatic Therapy at [facility]-  What to Expect!  Where:   [facility] [address] [facility] [place], [place] [address] Rehab phone [phone]  NOTE:  You will receive an automated phone message reminding you of your appt and it will say the appointment is at the [address] [facility] [address] [facility] clinic.          How to Prepare: Please make sure you drink 8 ounces of water about one hour prior to your pool session A caregiver may attend if needed with the patient to help assist as needed. A caregiver can sit in the pool room on chair. Please arrive IN YOUR SUIT and 15 minutes prior to your appointment - this helps to avoid delays in starting your session. Please make sure to attend to any toileting needs prior to entering the pool Locker rooms for changing are provided.   There is direct access to the pool deck form the locker room.  You can lock your belongings in a locker with lock provided. Once on the pool deck your therapist will ask if you have signed the Patient  Consent and Assignment of Benefits form before beginning treatment Your therapist may take your blood pressure prior to, during and after your session if indicated We usually try and create a home exercise program based on activities we do in the pool.  Please be thinking about who might be able to assist you in the pool should you need to participate in an aquatic home exercise program at the time of discharge if you need assistance.  Some patients do not want to or do not have the ability to participate in an aquatic home program - this is not a barrier in any way to you participating in aquatic therapy as part of your current therapy plan! After Discharge from PT, you can continue using home program at  the [facility]/, there is a drop-in fee for $5 ($45 a month)or for 60 years  or older $4.00 ($40 a month for seniors ) or any local [facility] pool.  Memberships for purchase are  available for gym/pool at [facility]  IT IS VERY IMPORTANT THAT YOUR LAST VISIT BE IN THE CLINIC AT [address] AFTER YOUR LAST AQUATIC VISIT.  PLEASE MAKE SURE THAT YOU HAVE A [address]  APPOINTMENT SCHEDULED.   About the pool: Pool is located approximately 500 FT from the entrance of the building.  Please bring a support person if you need assistance traveling this      distance.   Your therapist will assist you in entering the water; there are two ways to           enter: stairs with railings, and a mechanical lift. Your therapist will determine the most appropriate way for you.  Water temperature is usually between 88-90 degrees  There may be up to 2 other swimmers in the pool at the same time  The pool deck is tile, please wear shoes with good traction if you prefer not to be barefoot.    Contact Info:  For appointment scheduling and cancellations:         Please call the [facility]  PH:[phone]              Aquatic Therapy  [facility]patient [facility]       All sessions are 45 minutes                                                    

## 2022-01-30 NOTE — Therapy (Signed)
OUTPATIENT PHYSICAL THERAPY TREATMENT NOTE   Patient Name: Caitlin Harrington MRN: [id number] DOB:1946/01/17, 76 y.o., female Today's Date: 01/30/2022  PCP: Ileana Ladd, MD REFERRING PROVIDER: Ileana Ladd, MD   PT End of Session - 01/30/22 1137     Visit Number 6    Number of Visits 17    Date for PT Re-Evaluation 02/27/22    Authorization Type MCR/ BCBS    Authorization Time Period FOTO v6, v10; KX mod at v15    Progress Note Due on Visit 10    Equipment Utilized During Treatment Gait belt    Activity Tolerance Patient tolerated treatment well    Behavior During Therapy [facility] for tasks assessed/performed              Past Medical History:  Diagnosis Date   Asthma    COVID-19 12/2019   Depression    Diabetes mellitus without complication (HCC)    type 2   Fatigue    Gout    Hypertension    Insomnia    Numbness    Obesity    Paresthesia    Stroke (HCC) 10/2020   Vitamin D deficiency    Past Surgical History:  Procedure Laterality Date   ABDOMINAL HYSTERECTOMY     BUBBLE STUDY  05/06/2021   Procedure: BUBBLE STUDY;  Surgeon: Vesta Mixer, MD;  Location: [facility] ENDOSCOPY;  Service: Cardiovascular;;   cataracts  2018   TEE WITHOUT CARDIOVERSION N/A 05/06/2021   Procedure: TRANSESOPHAGEAL ECHOCARDIOGRAM (TEE);  Surgeon: Elease Hashimoto Deloris Ping, MD;  Location: [facility] ENDOSCOPY;  Service: Cardiovascular;  Laterality: N/A;   Patient Active Problem List   Diagnosis Date Noted   Exposure to intestinal infectious disease 11/03/2021   Hardening of the aorta (main artery of the heart) (HCC) 11/03/2021   Hyperglycemia due to type 2 diabetes mellitus (HCC) 11/03/2021   Memory impairment 11/03/2021   Relationship dysfunction 11/03/2021   Chronic kidney disease, stage 3a (HCC) 05/26/2021   Type 2 diabetes mellitus with other specified complication (HCC) 05/26/2021   Unsteady gait    CVA (cerebral vascular accident) (HCC) 05/04/2021   Acute CVA (cerebrovascular accident) (HCC)  05/04/2021   Pain in joint involving ankle and foot 11/06/2020   Acute renal insufficiency 11/06/2020   Allergic rhinitis 11/06/2020   Arthropathy 11/06/2020   Benign essential hypertension 11/06/2020   Bilateral carpal tunnel syndrome 11/06/2020   Chronic sinusitis 11/06/2020   Diverticulitis of colon 11/06/2020   Eczema 11/06/2020   Headache 11/06/2020   Long term (current) use of insulin (HCC) 11/06/2020   Acute metabolic encephalopathy 11/06/2020   Noncompliance with treatment 11/06/2020   Obstructive sleep apnea syndrome 11/06/2020   Other specified abnormal findings of blood chemistry 11/06/2020   Paresthesias 11/06/2020   Personal history of transient ischemic attack (TIA), and cerebral infarction without residual deficits 11/06/2020   Rash 11/06/2020   Depression 11/06/2020   Skin sensation disturbance 11/06/2020   Subclinical hypothyroidism 11/06/2020   Tick bite 11/06/2020   Tiredness 11/06/2020   Unspecified abnormal finding in specimens from other organs, systems and tissues 11/06/2020   Cerebral thrombosis with cerebral infarction 10/29/2020   Major depressive disorder, recurrent episode, moderate (HCC) 10/27/2020   Allergic reaction 10/26/2020   AKI (acute kidney injury) (HCC) 10/26/2020   Thrush 10/26/2020   Urinary tract infection without hematuria 05/12/2020   Encephalopathy 05/12/2020   Altered mental status    Hyperglycemia    Acute on chronic respiratory failure with hypoxia (HCC) 01/03/2020  COVID-19 12/30/2019   Hyperuricemia    Hypertension 02/18/2011   Prolonged depressive adjustment reaction 02/18/2011   Obesity 02/18/2011   Asthma, mild persistent 02/18/2011   Vitamin D deficiency 02/18/2011   Hyperlipidemia, unspecified 02/18/2011   Diabetes mellitus type 2, controlled, without complications (HCC) 02/18/2011   Insomnia 02/18/2011   Stress at work 02/18/2011    REFERRING DIAG: for decondition physically and h/o CVA with leg weakness and pain,  arm pain  THERAPY DIAG:  Difficulty in walking, not elsewhere classified  Unsteadiness on feet  Pain in left upper arm  Muscle weakness (generalized)  PERTINENT HISTORY: Thalamic stroke June 2022, DMII, HTN, hardening or aorta  PRECAUTIONS: Falls  SUBJECTIVE:  Pt reports that she feels her balance is somewhat better, but that her shoulder still hurts.  It is currently a 3/10, but can reach 8/10.  "PT is probably helping more than I realize"  PAIN:  Are you having pain? Yes NPRS scale: 3/10 Pain location: Lt upper arm    OBJECTIVE:    DIAGNOSTIC FINDINGS: 05/25/2021: CT Head without contrast: IMPRESSION: 1. No acute intracranial abnormality. 2. Expected evolution of right thalamic infarct from prior exam. Additional small infarcts that were acute on prior MRI have no definite CT correlate. 3. Background atrophy and chronic small vessel ischemia.   PATIENT SURVEYS:  FOTO 44%, predicted 51% in 13 visits   COGNITION:          Overall cognitive status: History of cognitive impairments - at baseline; diminished short-term memory, flat affect                            SENSATION:          Light touch: Appears intact BIL UE and LE   MUSCLE LENGTH: Thomas test: WNL BIL   POSTURE:  BIL genu valgum, forward posture   PALPATION: TTP to Lt lateral upper arm and Lt anterior thigh     LE MMT:   MMT Right 01/02/2022 Left 01/02/2022 Right 01/10/2022 Left 01/10/2022  Hip flexion 4+/5 3/5 5/5 4/5  Hip extension 3/5 2/5 3/5 3/5  Hip abduction 3/5 2/5 3+/5 3/5  Knee flexion 5/5 4/5  5/5  Knee extension 5/5 4/5  5/5  Ankle dorsiflexion 5/5 4/5    Ankle plantarflexion 5/5 5/5     (Blank rows = not tested)   UE MMT:   MMT Right 01/02/2022 Left 01/02/2022 Right 01/10/2022 Left 01/10/2022  Shoulder flexion 4/5 3/5 4+/5 3+/5  Shoulder abduction 3+/5 2+/5 4/5 3+/5  Shoulder ER 4+/5 2+/5 5/5 3+/5  Shoulder IR 4/5 3/5 5/5 3+/5  Elbow flexion 5/5 4/5  4/5  Elbow extension 5/5  4/5  4/5  Wrist flexion 5/5 4/5  4/5  Wrist extension 5/5 4/5  4/5   (Blank rows = not tested)   UE ROM: Lt shoulder abduction and flexion limited to roughly 100 degrees   FUNCTIONAL TESTS:  BERG BALANCE TEST Sitting to Standing: 3.      Stands independently using hands Standing Unsupported: 4.      Stands safely for 2 minutes Sitting Unsupported: 4.     Sits for 2 minutes independently Standing to Sitting: 3.     Controls descent with hands  Transfers: 3.     Transfers safely definite use of hands Standing with eyes closed: 4.     Stands safely for 10 seconds  Standing with feet together: 3.     Stands for  1 minute with supervision Reaching forward with outstretched arm: 3.     Reaches forward 5 inches Retrieving object from the floor: 3.     Able to pick up with supervision Turning to look behind: 4.     Looks behind from both sides and weight shifts well Turning 360 degrees: 4.     Able to turn in </=4 seconds  Place alternate foot on stool: 3.     Completes 8 steps in >20 seconds Standing with one foot in front: 0.     Loses balance while standing/stepping Standing on one foot: 0.     Unable   Total Score: 41/56     GAIT: Distance walked: 20 feet Assistive device utilized: None Level of assistance: Complete Independence Comments: Increased BIL genu valgum, decreased swing BIL, midfoot strike       TODAY'S TREATMENT:  [facility] Adult PT Treatment:                                                DATE: 01/30/2022 Therapeutic Exercise: Seated biceps curls with 6# dumbbells 3x10 BIL Seated shoulder ER with Blue TB 3x10 BIL Seated shoulder IR with Blue TB 3x10 BIL Seated row Blue TB 2x10 Seated shoulder scaption with 1# DB on Lt 3x10 (d/c d/t pain) Heel raises 3x15 Sit to stand from blue chair - 3x10  Therapeutic Activity - collecting information for FOTO and reviewing with patient  [facility] Adult PT Treatment:                                                DATE:  01/23/2022 Therapeutic Exercise: Seated biceps curls with 6# dumbbells 3x10 BIL Seated shoulder ER with GTB 3x10 BIL Seated shoulder IR with GTB 3x10 BIL Seated row GTB 2x10 Seated shoulder scaption with 1# DB on Lt 3x10 Heel raises 3x15 Sit to stand from blue chair - 3x10 Manual Therapy: N/A Neuromuscular re-ed: Tandem stance 3x45sec BIL  Standing marching with controlled leg lowering 3x20  Tandem walking with CGA  [facility] Adult PT Treatment:                                                DATE: 01/20/2022 Therapeutic Exercise: Seated biceps curls with 4# dumbbells 3x10 BIL Seated shoulder ER with GTB 3x10 BIL Seated shoulder IR with GTB 3x10 BIL Seated row GTB 2x10 Seated shoulder scaption with 1# DB on Lt 3x10 Squat 3x10 Heel raises on edge of Airex pad with slow, eccentric lowering 3x15 Manual Therapy: N/A Neuromuscular re-ed: High marching Airex pad in // bars 2x10 BIL Standing boxing with reaching outside of BOS to moving target 2x3 minutes in traditional stance, 2x3 minutes in [place] paw Staggered stance forward/ backward weight shifting 2x30sec BIL Therapeutic Activity: N/A Modalities: N/A Self Care: N/A   Treatment:  DATE: 01/16/2022 Therapeutic Exercise: Seated shoulder ER with RTB 3x10 BIL Seated shoulder IR with RTB 3x10 BIL Mini-squat side-stepping in // bars x4 laps (not today) Squat in // bars 3x10 DL heel raises in // bars 2x20 Step up 4'' 2x10 ea Seated row GTB -  2x10 Seated shoulder scaption with 1# DB on Lt 3x10 (not today) Manual Therapy: N/A Neuromuscular re-ed: Tandem stance 3x45sec BIL in // bars Standing marching with controlled leg lowering 3x20  Backward walking into PT resistance, corrective balance strategy as PT releases resistance x3 laps in // bars   [facility] Adult PT Treatment:                                                DATE: 01/06/2022 Therapeutic Exercise: Seated shoulder ER with RTB 2x10 BIL Seated shoulder IR with RTB 2x10  BIL Mini-squat side-stepping in // bars x4 laps Squat in // bars 3x10 DL heel raises in // bars 3x10 Seated shoulder scaption with 1# DB on Lt 3x10 Manual Therapy: N/A Neuromuscular re-ed: Semi-tandem stance 2x30sec BIL in // bars Standing marching with controlled leg lowering 3x20  Backward walking into PT resistance, corrective balance strategy as PT releases resistance x3 laps in // bars Therapeutic Activity: N/A Modalities: N/A Self Care: N/A   [facility] Adult PT Treatment:                                                DATE: 01/02/2022 Issued HEP         HOME EXERCISE PROGRAM: Access Code: [email or web address] URL: [email or web address] Date: 01/02/2022 Prepared by: Vanessa [place]   Exercises Supine Bridge - 1 x daily - 7 x weekly - 3 sets - 10 reps - 3-sec hold Sidelying Hip Abduction - 1 x daily - 7 x weekly - 2 sets - 10 reps - 3-sec hold Seated Long Arc Quad - 1 x daily - 7 x weekly - 3 sets - 10 reps - 5-sec hold Side to Side Weight Shift with Counter Support - 1 x daily - 7 x weekly - 3 sets - 10 reps Staggered Stance Forward Backward Weight Shift with Counter Support - 1 x daily - 7 x weekly - 3 sets - 10 reps     ASSESSMENT:   CLINICAL IMPRESSION: Anesha is progressing fair with therapy. She subjectively reports improved confidence with balance.  This is not evident on FOTO today but she reports some lack of clarity with the meaning of the questions.  She may require assistance with the questions when it administered next time.  We are progressing intensity of shoulder exercises, but she continues to have high levels of pain.  She will begin aquatic therapy starting next visit.  Continue per POC.     GOALS: Goals reviewed with patient? Yes   SHORT TERM GOALS:   STG Name Target Date Goal status  1 Pt will report understanding and adherence to her HEP in order to promote independence in the management of her symptoms. Baseline: HEP provided at  eval 01/20/2022: Pt reports daily adherence to her HEP 01/30/2022 ACHIEVED    LONG TERM GOALS:    LTG Name Target Date Goal status  1 Pt will improve her FOTO score to 51% in order to demonstrate improved functional ability as it relates to her primary impairments. Baseline:44%  3/10: 40% 02/27/2022 INITIAL  2 Pt will achieve a BERG score of 52 points in order to demonstrate improved functional balance needed to put on pants while standing. Baseline: 41  02/27/2022 INITIAL  3 Pt will achieve BIL global UE and LE strength of 4/5 or greater in order to progress her independent strengthening program with less limitation. Baseline: See MMT chart 01/20/2022: See updated MMT Chart 02/27/2022 PROGRESSING  4 Pt will report adherence to an advanced HEP in order to promote further progress toward her functional goals. Baseline: Initial HEP provided at eval 02/27/2022 INITIAL  5 Pt will demonstrate ability to lift 3 pounds overhead with her Lt UE in order to put away dishes with less limitation. Baseline: Pt unable to reach overhead with Lt UE. 02/27/2022 INITIAL    PLAN: PT FREQUENCY: 2x/week   PT DURATION: 8 weeks   PLANNED INTERVENTIONS: Therapeutic exercises, Therapeutic activity, Neuro Muscular re-education, Balance training, Gait training, Patient/Family education, Joint mobilization, Stair training, Aquatic Therapy, Dry Needling, Electrical stimulation, Cryotherapy, Moist heat, Taping, and Manual therapy   PLAN FOR NEXT SESSION: Progress early balance/ strengthening; introduce desensitization techniques for thalamic pain    Kevan Ny Reneta Niehaus PT 01/30/22 11:37 AM

## 2022-02-02 ENCOUNTER — Encounter: Payer: Self-pay | Admitting: *Deleted

## 2022-02-02 ENCOUNTER — Other Ambulatory Visit: Payer: Self-pay

## 2022-02-02 ENCOUNTER — Other Ambulatory Visit: Payer: Self-pay | Admitting: *Deleted

## 2022-02-02 DIAGNOSIS — E790 Hyperuricemia without signs of inflammatory arthritis and tophaceous disease: Secondary | ICD-10-CM | POA: Diagnosis not present

## 2022-02-02 DIAGNOSIS — Z87440 Personal history of urinary (tract) infections: Secondary | ICD-10-CM | POA: Diagnosis not present

## 2022-02-02 DIAGNOSIS — I700 Atherosclerosis of aorta: Secondary | ICD-10-CM | POA: Diagnosis not present

## 2022-02-02 DIAGNOSIS — R946 Abnormal results of thyroid function studies: Secondary | ICD-10-CM | POA: Diagnosis not present

## 2022-02-02 DIAGNOSIS — N1831 Chronic kidney disease, stage 3a: Secondary | ICD-10-CM | POA: Diagnosis not present

## 2022-02-02 DIAGNOSIS — E785 Hyperlipidemia, unspecified: Secondary | ICD-10-CM | POA: Diagnosis not present

## 2022-02-02 DIAGNOSIS — Z794 Long term (current) use of insulin: Secondary | ICD-10-CM | POA: Diagnosis not present

## 2022-02-02 DIAGNOSIS — Z8673 Personal history of transient ischemic attack (TIA), and cerebral infarction without residual deficits: Secondary | ICD-10-CM | POA: Diagnosis not present

## 2022-02-02 DIAGNOSIS — M25512 Pain in left shoulder: Secondary | ICD-10-CM | POA: Diagnosis not present

## 2022-02-02 DIAGNOSIS — G464 Cerebellar stroke syndrome: Secondary | ICD-10-CM | POA: Diagnosis not present

## 2022-02-02 DIAGNOSIS — R262 Difficulty in walking, not elsewhere classified: Secondary | ICD-10-CM | POA: Diagnosis not present

## 2022-02-02 DIAGNOSIS — G8929 Other chronic pain: Secondary | ICD-10-CM | POA: Diagnosis not present

## 2022-02-02 DIAGNOSIS — R4189 Other symptoms and signs involving cognitive functions and awareness: Secondary | ICD-10-CM | POA: Diagnosis not present

## 2022-02-02 DIAGNOSIS — I10 Essential (primary) hypertension: Secondary | ICD-10-CM | POA: Diagnosis not present

## 2022-02-02 DIAGNOSIS — E1159 Type 2 diabetes mellitus with other circulatory complications: Secondary | ICD-10-CM | POA: Diagnosis not present

## 2022-02-02 DIAGNOSIS — R7989 Other specified abnormal findings of blood chemistry: Secondary | ICD-10-CM | POA: Diagnosis not present

## 2022-02-02 NOTE — Patient Outreach (Signed)
Caitlin Harrington) Care Management Telephonic RN Care Manager Note   02/02/2022 Name:  Caitlin Harrington MRN:  [id number] DOB:  11-Dec-1945  Summary: Several outreach attempts to patient, successful outreach to pcp office to confirm patient as of 02/02/22 now connected with external care management services, incoming return call from pt significant other, return call to significant other to review THN progression with case closure with confirmed connection with external care management services. Significant other conforms pt doing well, pcp referring pt to orthopedic, pt presently at outpatient therapy and significant other has not outreached to customer services to set pt up with transportation services as previously discussed  Significant other voiced appreciation for all services rendered by RN CM   Diabetes Last HgA1c was 6.3 on 09/26/21  Recommendations/Changes made from today's visit: Outreach to pcp office and confirmed connection with external care management services. Confirmed with significant other that pt was seen at pcp office 02/02/22 by pcp and pharmacist's CRN Confirmed pt is now being followed by external care management vendor pharmacy and CRN at pcp office Encouraged significant other to outreach to insurance customer services to set up transportation services, and to request assist if needed from external CRN Reviewed case closure  Encouraged to return a call to [facility] RN CM prn    Subjective: Caitlin Harrington is an 76 y.o. year old female who is a primary patient of [name], [name], MD. The care management team was consulted for assistance with care management and/or care coordination needs.    Telephonic RN Care Manager completed Telephone Visit today.   Objective:  Medications Reviewed Today     Reviewed by [name], PT (Physical Therapist) on 01/30/22 at 1137  Med List Status: <None>   Medication Order Taking? Sig Documenting Provider Last Dose  Status Informant  albuterol (VENTOLIN HFA) 108 (90 Base) MCG/ACT inhaler [id number] No Inhale 2 puffs into the lungs 2 (two) times daily as needed for shortness of breath or wheezing.  Patient not taking: Reported on 01/02/2022   [provider] Not Taking Active Family Member  allopurinol (ZYLOPRIM) 300 MG tablet [id number] No Take 300 mg by mouth daily. [provider] Taking Active Family Member           Med Note [name] May 25, 2021  8:01 PM)    amLODipine (NORVASC) 5 MG tablet [id number] No Take 1 tablet (5 mg total) by mouth daily. [name], MD Taking Active   aspirin EC 81 MG EC tablet [id number] No Take 1 tablet (81 mg total) by mouth daily. Swallow whole. [name], MD Taking Active Family Member  blood glucose meter kit and supplies KIT [id number] No Dispense based on patient and insurance preference. Use up to four times daily as directed. (FOR ICD-9 250.00, 250.01). [name], MD Taking Active Family Member  Blood Pressure Monitoring (BLOOD PRESSURE KIT) KIT [id number] No See admin instructions. [provider] Taking Active Family Member  carvedilol (COREG) 6.25 MG tablet [id number] No Take 6.25 mg by mouth 2 (two) times daily with a meal. [provider] Taking Active Family Member           Med Note [name] May 25, 2021  7:53 PM)    clopidogrel (PLAVIX) 75 MG tablet [id number] No Take 1 tablet (75 mg total) by mouth daily. [name], MD Taking Active   EUTHYROX 25 MCG tablet [id number] No Take 25 mcg by mouth daily. [provider] Taking Active Family Member  hydrOXYzine (ATARAX/VISTARIL) 25 MG tablet [id number] No Take 25 mg by mouth daily as needed for anxiety. [provider] Taking Active Family Member           Med Note [name] Aug 25, 2021 10:55 AM) 08/25/21 taking one tab at bedtime  insulin glargine (LANTUS) 100 UNIT/ML Solostar Pen [id number] No Inject 15 Units into the skin  daily. [provider] Taking Active Family Member           Med Note [name]   Wed Jul 30, 2021 12:29 AM) 07/29/21 pcp office pharmacy reported pt lantus increased to 20 unit on 07/11/21 office visit   Insulin Pen Needle (NOVOFINE) 30G X 8 MM MISC [id number] No Inject 10 each into the skin as directed. [name], MD Taking Active Family Member  losartan (COZAAR) 25 MG tablet [id number] No Take 1 tablet (25 mg total) by mouth daily. [name], MD Taking Active   metFORMIN (GLUCOPHAGE) 500 MG tablet [id number] No Take 500 mg by mouth daily. [provider] Taking Active Family Member  PARoxetine (PAXIL) 40 MG tablet [id number] No Take 40 mg by mouth every morning. [provider] Taking Active Family Member  rosuvastatin (CRESTOR) 20 MG tablet [id number] No Take 1 tablet (20 mg total) by mouth daily. Pokhrel, [name], MD Taking Active Family Member  sertraline (ZOLOFT) 50 MG tablet [id number] No Take 50 mg by mouth daily. [provider] Taking Active   sitaGLIPtin (JANUVIA) 100 MG tablet [id number] No Take 100 mg by mouth daily. [provider] Taking Active Family Member           Med Note [name] May 25, 2021  7:44 PM)    terazosin (HYTRIN) 5 MG capsule [id number] No Take 5 mg by mouth daily. [provider] Taking Active Family Member  UNABLE TO FIND [id number] No 1 tablet daily. Med Name: centrum gummies [provider] Taking Active   Med List Note [name] 05/05/21 0174): [id number] donald             SDOH:  (Social Determinants of Harrington) assessments and interventions performed:  SDOH Interventions    Flowsheet Row Most Recent Value  SDOH Interventions   Food Insecurity Interventions Intervention Not Indicated  Housing Interventions Intervention Not Indicated  Intimate Partner Violence Interventions Intervention Not Indicated  Stress Interventions Intervention Not Indicated   Transportation Interventions Intervention Not Indicated       Care Plan  Review of patient past medical history, allergies, medications, Harrington status, including review of consultants reports, laboratory and other test data, was performed as part of comprehensive evaluation for care management services.   Care Plan : RN Care Manager Plan of Care  Updates made by [name], RN since 02/02/2022 12:00 AM     Problem: Complex Care Coordination Needs and disease management in patient with DM   Priority: High     Long-Range Goal: Establish Plan of Care for Management Complex SDOH Barriers, disease management and Care Coordination Needs in patient with DM Completed 02/02/2022  Start Date: 10/22/2021  This Visit's Progress: On track  Recent Progress: On track  Priority: High  Note:   Current Barriers:  Knowledge Deficits related to plan of care for management of DMII and mobility/debility  Care Coordination needs related to Lacks knowledge of community resource: medication management, medicare enrollment annual changes, pcp pharmacist Pt lives with  Mr Gigi Gin who assists with the management of her iADLs to include obtaining her medicines and medical supplies 01/27/22 Unsuccessful outreach, message left 01/28/22 Caregiver Mr [name] returned a call  RN CM Clinical Goal(s):  Patient/caregiver will verbalize understanding of plan for management of DMII as evidenced by improved HgA1c/home management  through collaboration with RN Care manager, provider, and care team.   Interventions: Continue follow up outreaches to assess changes of any care coordination or disease management/education need Inter-disciplinary care team collaboration (see longitudinal plan of care) Evaluation of current treatment plan related to  self management and patient's adherence to plan as established by provider 12/16/21 Reviewed process of facility and outpatient therapy medicare guidelines and how to obtain  community level orders for services Discussed patient may need to be returned to pcp for evaluation if symptoms have worsen since last evaluation or to met medicare guidelines Assessed for any concerns with medications since mail order services initiated  Assessed home care of diabetes Outreach to primary care provider (PCP) office [phone] Thayer Headings about Patient interest in outpatient physical therapy who sent a note to pcp RN  12/26/21 Follow up to check on outpatient therapies Mr [name] reports services are being worked on. They are at a store at the time of the outreach. Mr [name] was requested to return a call to RN [facility]  02/02/22 Outreach to pcp office and confirmed connection with external care management services. Confirmed with significant other that pt was seen at pcp office 02/02/22 by pcp and pharmacist's CRN Encouraged significant other to outreach to insurance customer services to set up transportation services, and to request assist if needed from external CRN  Diabetes Interventions:  (Status:   case closure- connected with external care management services 02/02/22 ) Long Term Goal Assessed patient's understanding of A1c goal: <7% Reviewed medications with patient and discussed importance of medication adherence Follow up on patient recent pcp visit with anticipated meeting with pcp pharmacist and medicare enrollment changes Messaged Mr Ree Shay [phone] for an possible update on any medicare active enrollment changes for patient 11/03/21 reviewed with Mr [name] the outreach from Mr Minta Balsam and attempted outreach to [employer] office pharmacy[facility] staff with voice message left Allowed him time to ventilate his feelings  Outreached as a conference call to [facility] [phone] Allowed pt/caregiver to ask all questions and ventilate to [facility] pharmacy staff member, Alvester Chou briefly with Ms Elia Nunley to pcp office, spoke with Corey Skains to leave a message for pcp nurse  related to request for assist with new prescriptions for FEP mail order services, as many 3 month supply medicines and diabetic supplies as possible Left fax number [phone], call in number [phone] and electronic -[facility] mail order info Incoming call from [place] pharmacist to review pt request for mail order. RN CM answered pharmacist's questions. Updated Mr [name] via Text as requested that the pcp office staff had responded and is sending the prescriptions 12/16/21 improved  Recommendations/Changes made from today's visit: Change patient to mail order services via her insurance FEP pharmacy benefit Outreach to pcp office to follow up on request for new prescriptions for mail order services provided mail order service number [phone] fax [phone] Lab Results  Component Value Date   HGBA1C 10.7 (H) 05/03/2021   Mobility/debility  (Status:   case closure- connected with external care management services 02/02/22 )  Short Term  Goal Evaluation of current treatment plan related to  mobility/debility , Transportation and Lacks knowledge of community resource: outpatient therapy, medical transportation  self-management and patient's adherence to plan as established by provider. Discussed plans with patient for ongoing care management follow up and provided patient with direct contact information for care management team Provided education to patient re: outpatient therapy, medical transportation Collaborated with insurance staff, pcp staff, caregiver regarding outpatient therapy, medical transportation Provided patient and/or caregiver with outpatient therapy, medical transportation information about outpatient therapy, medical transportation Advice worker) Discussed plans with patient for ongoing care management follow up and provided patient with direct contact information for care management team  Patient Goals/Self-Care Activities: Take all medications as  prescribed Attend all scheduled provider appointments Call pharmacy for medication refills 3-7 days in advance of running out of medications Perform all self care activities independently  Perform IADL's (shopping, preparing meals, housekeeping, managing finances) independently Follow up with medicare enrollment staff from [facility] and pcp office pharmacy staff for medication management   Follow Up Plan: No further follow up required: case closure - now connected with external care management pharmacy and nurse services  The patient has been provided with contact information for the care management team and has been advised to call with any Harrington related questions or concerns.        Plan: No further follow up required: case closure - now connected with external care management pharmacy and nurse services   Adena. Lavina Hamman, RN, BSN, [place] Coordinator Office number [phone] [facility] number [phone] Fax number [phone]

## 2022-02-06 ENCOUNTER — Ambulatory Visit (HOSPITAL_BASED_OUTPATIENT_CLINIC_OR_DEPARTMENT_OTHER): Payer: Federal, State, Local not specified - PPO | Admitting: Physical Therapy

## 2022-02-06 DIAGNOSIS — R262 Difficulty in walking, not elsewhere classified: Secondary | ICD-10-CM | POA: Diagnosis not present

## 2022-02-06 DIAGNOSIS — G464 Cerebellar stroke syndrome: Secondary | ICD-10-CM | POA: Diagnosis not present

## 2022-02-09 DIAGNOSIS — G464 Cerebellar stroke syndrome: Secondary | ICD-10-CM | POA: Diagnosis not present

## 2022-02-09 DIAGNOSIS — R262 Difficulty in walking, not elsewhere classified: Secondary | ICD-10-CM | POA: Diagnosis not present

## 2022-02-11 DIAGNOSIS — M25512 Pain in left shoulder: Secondary | ICD-10-CM | POA: Diagnosis not present

## 2022-02-13 ENCOUNTER — Ambulatory Visit (HOSPITAL_BASED_OUTPATIENT_CLINIC_OR_DEPARTMENT_OTHER): Payer: Self-pay | Admitting: Physical Therapy

## 2022-02-16 DIAGNOSIS — M25512 Pain in left shoulder: Secondary | ICD-10-CM | POA: Diagnosis not present

## 2022-02-20 DIAGNOSIS — R262 Difficulty in walking, not elsewhere classified: Secondary | ICD-10-CM | POA: Diagnosis not present

## 2022-02-20 DIAGNOSIS — G464 Cerebellar stroke syndrome: Secondary | ICD-10-CM | POA: Diagnosis not present

## 2022-02-23 DIAGNOSIS — G464 Cerebellar stroke syndrome: Secondary | ICD-10-CM | POA: Diagnosis not present

## 2022-02-23 DIAGNOSIS — R262 Difficulty in walking, not elsewhere classified: Secondary | ICD-10-CM | POA: Diagnosis not present

## 2022-02-27 ENCOUNTER — Ambulatory Visit (HOSPITAL_BASED_OUTPATIENT_CLINIC_OR_DEPARTMENT_OTHER): Payer: Federal, State, Local not specified - PPO | Admitting: Physical Therapy

## 2022-02-27 DIAGNOSIS — G464 Cerebellar stroke syndrome: Secondary | ICD-10-CM | POA: Diagnosis not present

## 2022-02-27 DIAGNOSIS — R262 Difficulty in walking, not elsewhere classified: Secondary | ICD-10-CM | POA: Diagnosis not present

## 2022-03-02 DIAGNOSIS — G464 Cerebellar stroke syndrome: Secondary | ICD-10-CM | POA: Diagnosis not present

## 2022-03-02 DIAGNOSIS — R262 Difficulty in walking, not elsewhere classified: Secondary | ICD-10-CM | POA: Diagnosis not present

## 2022-03-06 ENCOUNTER — Ambulatory Visit (HOSPITAL_BASED_OUTPATIENT_CLINIC_OR_DEPARTMENT_OTHER): Payer: Self-pay | Admitting: Physical Therapy

## 2022-03-06 DIAGNOSIS — G464 Cerebellar stroke syndrome: Secondary | ICD-10-CM | POA: Diagnosis not present

## 2022-03-06 DIAGNOSIS — R262 Difficulty in walking, not elsewhere classified: Secondary | ICD-10-CM | POA: Diagnosis not present

## 2022-03-09 DIAGNOSIS — R262 Difficulty in walking, not elsewhere classified: Secondary | ICD-10-CM | POA: Diagnosis not present

## 2022-03-09 DIAGNOSIS — G464 Cerebellar stroke syndrome: Secondary | ICD-10-CM | POA: Diagnosis not present

## 2022-03-13 ENCOUNTER — Ambulatory Visit (HOSPITAL_BASED_OUTPATIENT_CLINIC_OR_DEPARTMENT_OTHER): Payer: Self-pay | Admitting: Physical Therapy

## 2022-03-13 DIAGNOSIS — R262 Difficulty in walking, not elsewhere classified: Secondary | ICD-10-CM | POA: Diagnosis not present

## 2022-03-13 DIAGNOSIS — G464 Cerebellar stroke syndrome: Secondary | ICD-10-CM | POA: Diagnosis not present

## 2022-03-16 DIAGNOSIS — R262 Difficulty in walking, not elsewhere classified: Secondary | ICD-10-CM | POA: Diagnosis not present

## 2022-03-16 DIAGNOSIS — G464 Cerebellar stroke syndrome: Secondary | ICD-10-CM | POA: Diagnosis not present

## 2022-03-20 ENCOUNTER — Ambulatory Visit (HOSPITAL_BASED_OUTPATIENT_CLINIC_OR_DEPARTMENT_OTHER): Payer: Self-pay | Admitting: Physical Therapy

## 2022-03-26 DIAGNOSIS — G464 Cerebellar stroke syndrome: Secondary | ICD-10-CM | POA: Diagnosis not present

## 2022-03-26 DIAGNOSIS — R262 Difficulty in walking, not elsewhere classified: Secondary | ICD-10-CM | POA: Diagnosis not present

## 2022-03-27 ENCOUNTER — Ambulatory Visit: Payer: Medicare Other

## 2022-03-31 DIAGNOSIS — G464 Cerebellar stroke syndrome: Secondary | ICD-10-CM | POA: Diagnosis not present

## 2022-03-31 DIAGNOSIS — R262 Difficulty in walking, not elsewhere classified: Secondary | ICD-10-CM | POA: Diagnosis not present

## 2022-04-06 DIAGNOSIS — N390 Urinary tract infection, site not specified: Secondary | ICD-10-CM | POA: Diagnosis not present

## 2022-04-06 DIAGNOSIS — Z6835 Body mass index (BMI) 35.0-35.9, adult: Secondary | ICD-10-CM | POA: Diagnosis not present

## 2022-04-10 DIAGNOSIS — G464 Cerebellar stroke syndrome: Secondary | ICD-10-CM | POA: Diagnosis not present

## 2022-04-10 DIAGNOSIS — R262 Difficulty in walking, not elsewhere classified: Secondary | ICD-10-CM | POA: Diagnosis not present

## 2022-04-17 DIAGNOSIS — R262 Difficulty in walking, not elsewhere classified: Secondary | ICD-10-CM | POA: Diagnosis not present

## 2022-04-17 DIAGNOSIS — G464 Cerebellar stroke syndrome: Secondary | ICD-10-CM | POA: Diagnosis not present

## 2022-04-24 DIAGNOSIS — G464 Cerebellar stroke syndrome: Secondary | ICD-10-CM | POA: Diagnosis not present

## 2022-04-24 DIAGNOSIS — R262 Difficulty in walking, not elsewhere classified: Secondary | ICD-10-CM | POA: Diagnosis not present

## 2022-04-27 DIAGNOSIS — G464 Cerebellar stroke syndrome: Secondary | ICD-10-CM | POA: Diagnosis not present

## 2022-04-27 DIAGNOSIS — R262 Difficulty in walking, not elsewhere classified: Secondary | ICD-10-CM | POA: Diagnosis not present

## 2022-05-01 DIAGNOSIS — G464 Cerebellar stroke syndrome: Secondary | ICD-10-CM | POA: Diagnosis not present

## 2022-05-01 DIAGNOSIS — R262 Difficulty in walking, not elsewhere classified: Secondary | ICD-10-CM | POA: Diagnosis not present

## 2022-05-11 DIAGNOSIS — R262 Difficulty in walking, not elsewhere classified: Secondary | ICD-10-CM | POA: Diagnosis not present

## 2022-05-11 DIAGNOSIS — G464 Cerebellar stroke syndrome: Secondary | ICD-10-CM | POA: Diagnosis not present

## 2022-06-05 DIAGNOSIS — I10 Essential (primary) hypertension: Secondary | ICD-10-CM | POA: Diagnosis not present

## 2022-06-05 DIAGNOSIS — F329 Major depressive disorder, single episode, unspecified: Secondary | ICD-10-CM | POA: Diagnosis not present

## 2022-06-05 DIAGNOSIS — Z8673 Personal history of transient ischemic attack (TIA), and cerebral infarction without residual deficits: Secondary | ICD-10-CM | POA: Diagnosis not present

## 2022-06-05 DIAGNOSIS — E039 Hypothyroidism, unspecified: Secondary | ICD-10-CM | POA: Diagnosis not present

## 2022-06-05 DIAGNOSIS — R4189 Other symptoms and signs involving cognitive functions and awareness: Secondary | ICD-10-CM | POA: Diagnosis not present

## 2022-06-05 DIAGNOSIS — R29898 Other symptoms and signs involving the musculoskeletal system: Secondary | ICD-10-CM | POA: Diagnosis not present

## 2022-06-05 DIAGNOSIS — Z794 Long term (current) use of insulin: Secondary | ICD-10-CM | POA: Diagnosis not present

## 2022-06-05 DIAGNOSIS — I700 Atherosclerosis of aorta: Secondary | ICD-10-CM | POA: Diagnosis not present

## 2022-06-05 DIAGNOSIS — E1159 Type 2 diabetes mellitus with other circulatory complications: Secondary | ICD-10-CM | POA: Diagnosis not present

## 2022-06-05 DIAGNOSIS — E785 Hyperlipidemia, unspecified: Secondary | ICD-10-CM | POA: Diagnosis not present

## 2022-06-05 DIAGNOSIS — E790 Hyperuricemia without signs of inflammatory arthritis and tophaceous disease: Secondary | ICD-10-CM | POA: Diagnosis not present

## 2022-06-05 DIAGNOSIS — N1831 Chronic kidney disease, stage 3a: Secondary | ICD-10-CM | POA: Diagnosis not present

## 2022-06-19 DIAGNOSIS — Z1231 Encounter for screening mammogram for malignant neoplasm of breast: Secondary | ICD-10-CM | POA: Diagnosis not present

## 2022-06-25 IMAGING — CT CT HEAD W/O CM
3 series · 15 of 47 positions shown, 18 images · non-contrast
Comparison: 05/03/2021

CLINICAL DATA: Altered mental status

EXAM:
CT HEAD WITHOUT CONTRAST
TECHNIQUE: Contiguous axial images were obtained from the base of the skull
through the vertex without intravenous contrast.

[Series 3: head wo · axial · 0.44mm/px · z∈[+1637,+1762]mm · 9 of 31 slices shown, 12 images]
[im 3/31  brain]
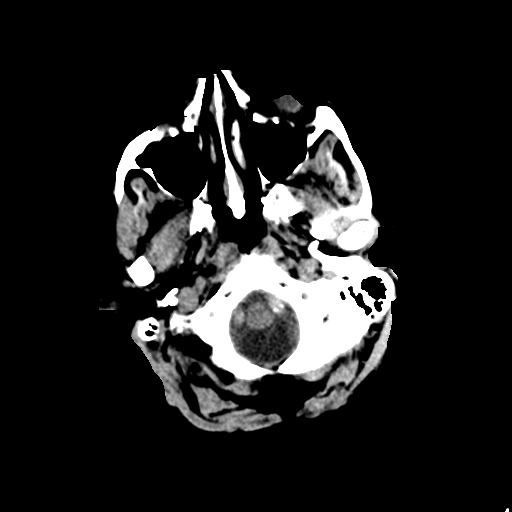
[im 3/31  bone]
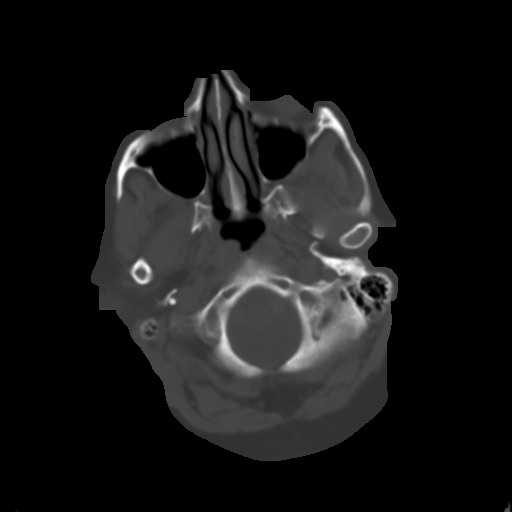
[im 6/31  brain]
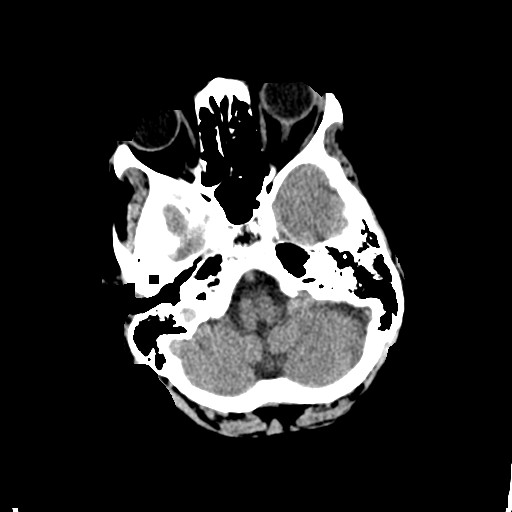
[im 9/31  brain]
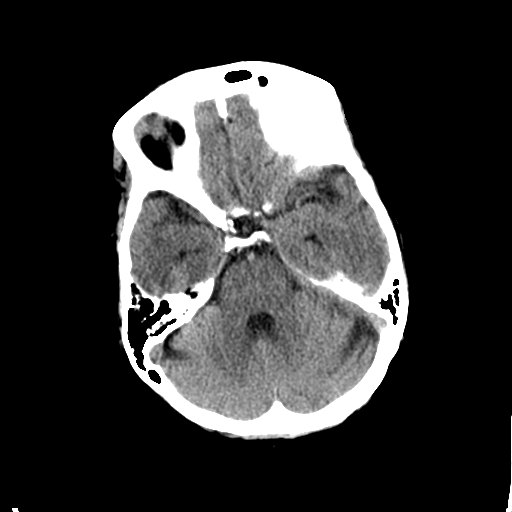
[im 12/31  brain]
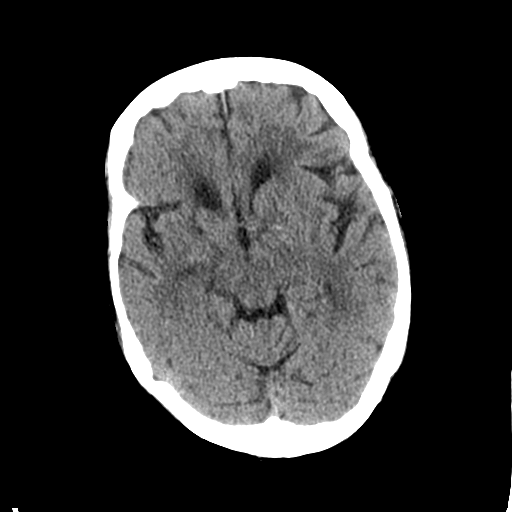
[im 16/31  brain]
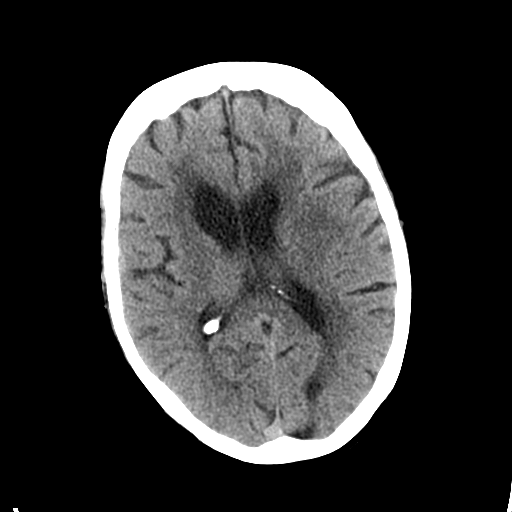
[im 16/31  bone]
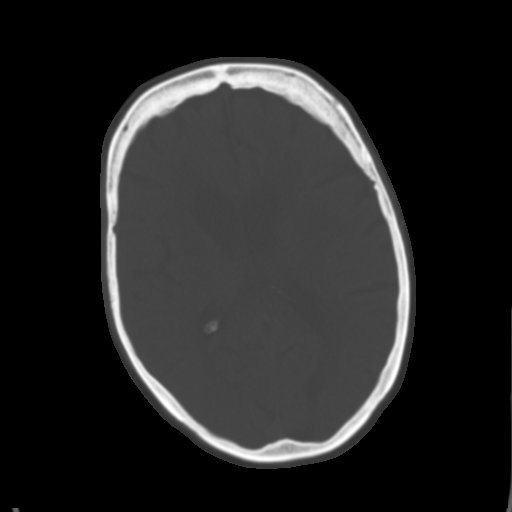
[im 19/31  brain]
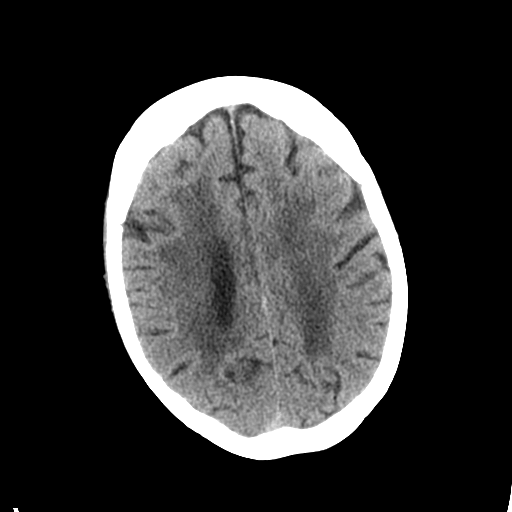
[im 22/31  brain]
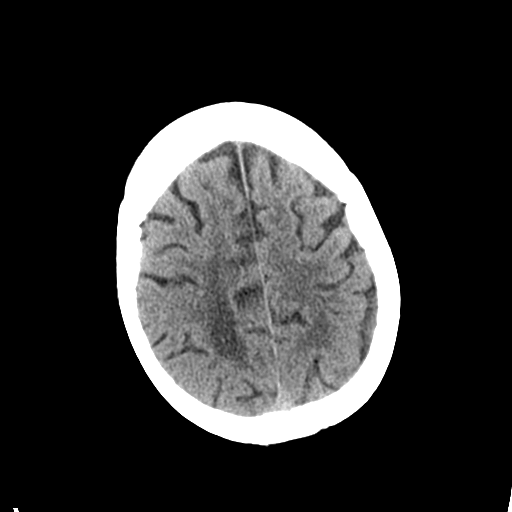
[im 25/31  brain]
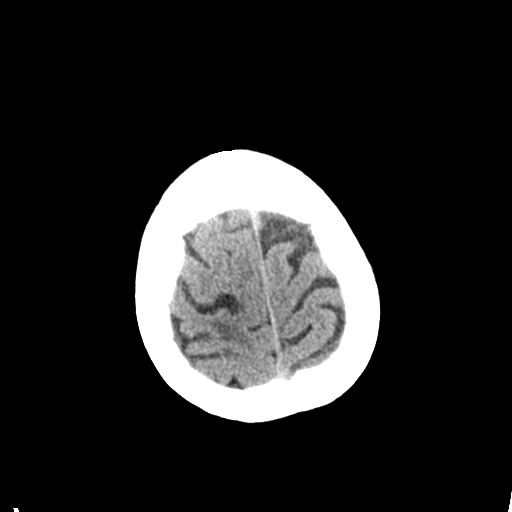
[im 28/31  brain]
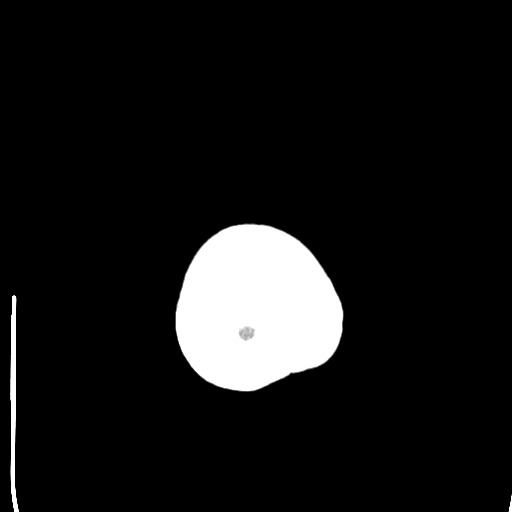
[im 28/31  bone]
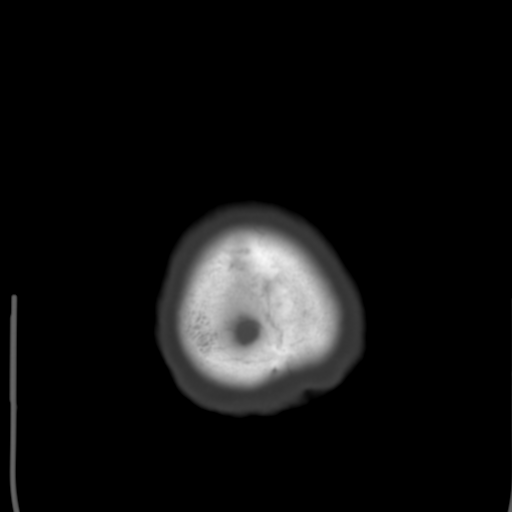

[Series 4: coronal soft tissue · coronal · 0.31mm/px · 3 of 64 slices shown]
[im 22/64  brain]
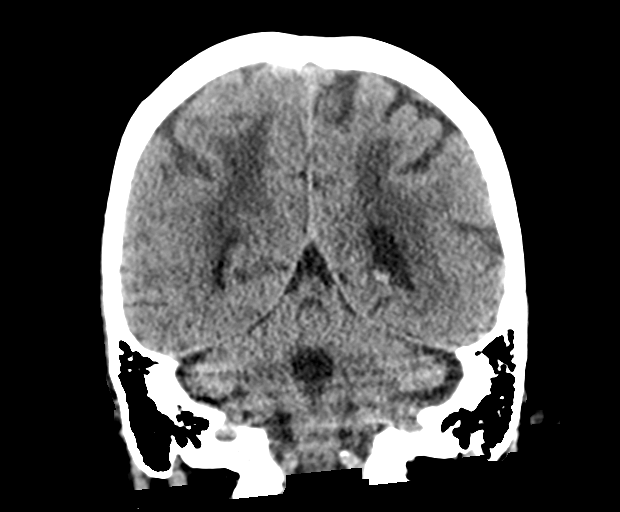
[im 29/64  brain]
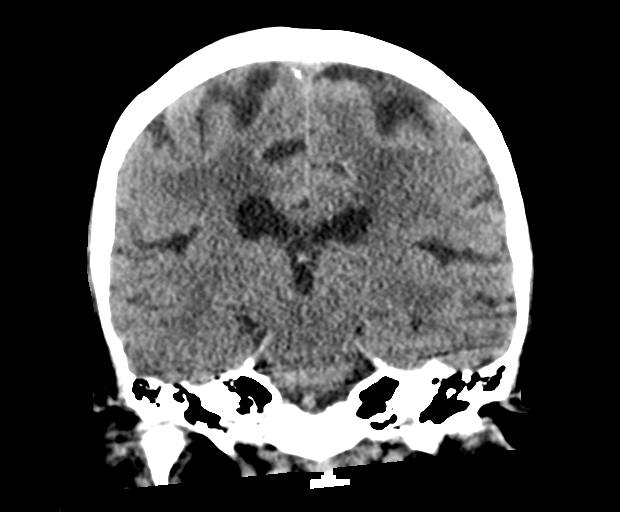
[im 36/64  brain]
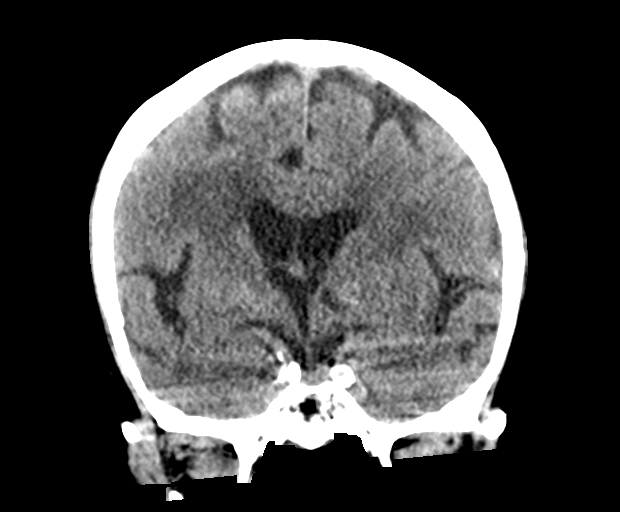

[Series 5: sagittal soft tissue · sagittal · 0.31mm/px · 3 of 51 slices shown]
[im 17/51  brain]
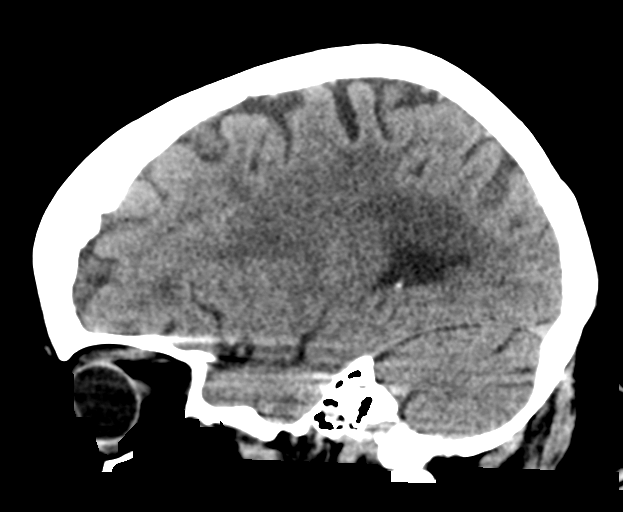
[im 26/51  brain]
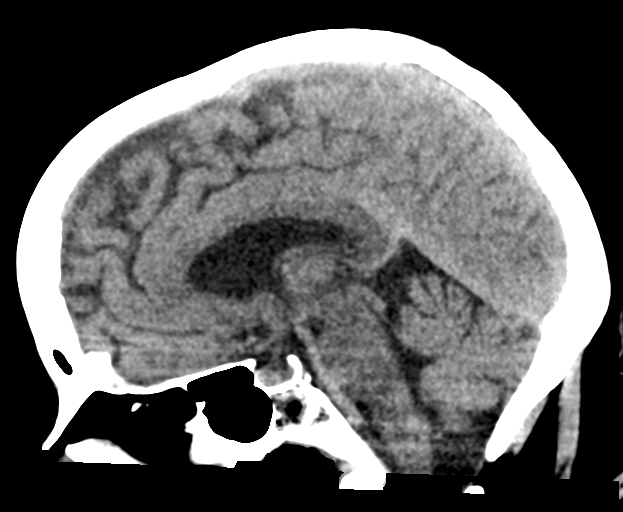
[im 34/51  brain]
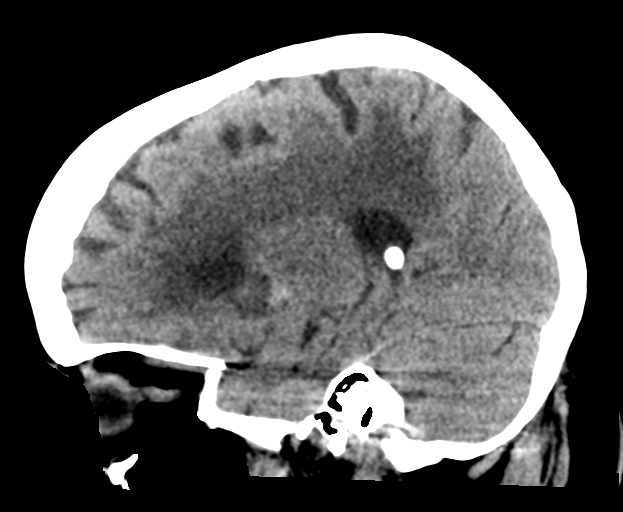

[15 of 47 positions shown; findings below may reference images not displayed]

FINDINGS: Brain: The previously seen acute acute right thalamic infarct on MRI
is again noted. The previously seen smaller bifrontal infarcts not
definitively seen by CT. No hemorrhage. No new areas of infarction.
Old right basal ganglia lacunar infarct. Chronic small vessel
disease throughout the deep white matter.

Vascular: No hyperdense vessel or unexpected calcification.

Skull: No acute calvarial abnormality.

Sinuses/Orbits: No acute findings

Other: None
IMPRESSION: Acute right thalamic infarct again seen as noted on prior MRI.

Other smaller frontal infarcts not visualized by CT.

No hemorrhage.

## 2022-07-17 DIAGNOSIS — M6281 Muscle weakness (generalized): Secondary | ICD-10-CM | POA: Diagnosis not present

## 2022-07-17 DIAGNOSIS — R2681 Unsteadiness on feet: Secondary | ICD-10-CM | POA: Diagnosis not present

## 2022-07-17 DIAGNOSIS — R262 Difficulty in walking, not elsewhere classified: Secondary | ICD-10-CM | POA: Diagnosis not present

## 2022-08-07 DIAGNOSIS — R2681 Unsteadiness on feet: Secondary | ICD-10-CM | POA: Diagnosis not present

## 2022-08-07 DIAGNOSIS — R262 Difficulty in walking, not elsewhere classified: Secondary | ICD-10-CM | POA: Diagnosis not present

## 2022-08-07 DIAGNOSIS — M6281 Muscle weakness (generalized): Secondary | ICD-10-CM | POA: Diagnosis not present

## 2022-08-14 DIAGNOSIS — M6281 Muscle weakness (generalized): Secondary | ICD-10-CM | POA: Diagnosis not present

## 2022-08-14 DIAGNOSIS — R262 Difficulty in walking, not elsewhere classified: Secondary | ICD-10-CM | POA: Diagnosis not present

## 2022-08-14 DIAGNOSIS — R2681 Unsteadiness on feet: Secondary | ICD-10-CM | POA: Diagnosis not present

## 2022-08-21 DIAGNOSIS — R2681 Unsteadiness on feet: Secondary | ICD-10-CM | POA: Diagnosis not present

## 2022-08-21 DIAGNOSIS — M6281 Muscle weakness (generalized): Secondary | ICD-10-CM | POA: Diagnosis not present

## 2022-08-21 DIAGNOSIS — R262 Difficulty in walking, not elsewhere classified: Secondary | ICD-10-CM | POA: Diagnosis not present

## 2022-08-28 DIAGNOSIS — R262 Difficulty in walking, not elsewhere classified: Secondary | ICD-10-CM | POA: Diagnosis not present

## 2022-08-28 DIAGNOSIS — M6281 Muscle weakness (generalized): Secondary | ICD-10-CM | POA: Diagnosis not present

## 2022-08-28 DIAGNOSIS — R2681 Unsteadiness on feet: Secondary | ICD-10-CM | POA: Diagnosis not present

## 2022-09-11 DIAGNOSIS — R262 Difficulty in walking, not elsewhere classified: Secondary | ICD-10-CM | POA: Diagnosis not present

## 2022-09-11 DIAGNOSIS — M6281 Muscle weakness (generalized): Secondary | ICD-10-CM | POA: Diagnosis not present

## 2022-09-11 DIAGNOSIS — R2681 Unsteadiness on feet: Secondary | ICD-10-CM | POA: Diagnosis not present

## 2022-09-29 DIAGNOSIS — Z0000 Encounter for general adult medical examination without abnormal findings: Secondary | ICD-10-CM | POA: Diagnosis not present

## 2022-09-29 DIAGNOSIS — I10 Essential (primary) hypertension: Secondary | ICD-10-CM | POA: Diagnosis not present

## 2022-09-29 DIAGNOSIS — E785 Hyperlipidemia, unspecified: Secondary | ICD-10-CM | POA: Diagnosis not present

## 2022-09-29 DIAGNOSIS — I700 Atherosclerosis of aorta: Secondary | ICD-10-CM | POA: Diagnosis not present

## 2022-09-29 DIAGNOSIS — E790 Hyperuricemia without signs of inflammatory arthritis and tophaceous disease: Secondary | ICD-10-CM | POA: Diagnosis not present

## 2022-09-29 DIAGNOSIS — G8929 Other chronic pain: Secondary | ICD-10-CM | POA: Diagnosis not present

## 2022-09-29 DIAGNOSIS — F329 Major depressive disorder, single episode, unspecified: Secondary | ICD-10-CM | POA: Diagnosis not present

## 2022-09-29 DIAGNOSIS — Z794 Long term (current) use of insulin: Secondary | ICD-10-CM | POA: Diagnosis not present

## 2022-09-29 DIAGNOSIS — R4189 Other symptoms and signs involving cognitive functions and awareness: Secondary | ICD-10-CM | POA: Diagnosis not present

## 2022-09-29 DIAGNOSIS — N1831 Chronic kidney disease, stage 3a: Secondary | ICD-10-CM | POA: Diagnosis not present

## 2022-09-29 DIAGNOSIS — E1159 Type 2 diabetes mellitus with other circulatory complications: Secondary | ICD-10-CM | POA: Diagnosis not present

## 2022-09-29 DIAGNOSIS — Z1239 Encounter for other screening for malignant neoplasm of breast: Secondary | ICD-10-CM | POA: Diagnosis not present

## 2022-09-29 DIAGNOSIS — E039 Hypothyroidism, unspecified: Secondary | ICD-10-CM | POA: Diagnosis not present

## 2022-09-29 DIAGNOSIS — Z6835 Body mass index (BMI) 35.0-35.9, adult: Secondary | ICD-10-CM | POA: Diagnosis not present

## 2022-10-05 ENCOUNTER — Encounter: Payer: Self-pay | Admitting: Gastroenterology

## 2022-10-09 DIAGNOSIS — R2681 Unsteadiness on feet: Secondary | ICD-10-CM | POA: Diagnosis not present

## 2022-10-09 DIAGNOSIS — M6281 Muscle weakness (generalized): Secondary | ICD-10-CM | POA: Diagnosis not present

## 2022-10-09 DIAGNOSIS — R262 Difficulty in walking, not elsewhere classified: Secondary | ICD-10-CM | POA: Diagnosis not present

## 2022-10-28 DIAGNOSIS — R300 Dysuria: Secondary | ICD-10-CM | POA: Diagnosis not present

## 2022-11-05 DIAGNOSIS — N1831 Chronic kidney disease, stage 3a: Secondary | ICD-10-CM | POA: Diagnosis not present

## 2022-11-05 DIAGNOSIS — E039 Hypothyroidism, unspecified: Secondary | ICD-10-CM | POA: Diagnosis not present

## 2022-11-05 DIAGNOSIS — E1159 Type 2 diabetes mellitus with other circulatory complications: Secondary | ICD-10-CM | POA: Diagnosis not present

## 2022-11-05 DIAGNOSIS — G8929 Other chronic pain: Secondary | ICD-10-CM | POA: Diagnosis not present

## 2022-11-05 DIAGNOSIS — I10 Essential (primary) hypertension: Secondary | ICD-10-CM | POA: Diagnosis not present

## 2022-11-05 DIAGNOSIS — F329 Major depressive disorder, single episode, unspecified: Secondary | ICD-10-CM | POA: Diagnosis not present

## 2022-11-05 DIAGNOSIS — E785 Hyperlipidemia, unspecified: Secondary | ICD-10-CM | POA: Diagnosis not present

## 2022-11-13 ENCOUNTER — Ambulatory Visit: Payer: Federal, State, Local not specified - PPO | Admitting: Gastroenterology

## 2022-12-04 DIAGNOSIS — Z780 Asymptomatic menopausal state: Secondary | ICD-10-CM | POA: Diagnosis not present

## 2022-12-14 DIAGNOSIS — I6789 Other cerebrovascular disease: Secondary | ICD-10-CM | POA: Diagnosis not present

## 2022-12-14 DIAGNOSIS — Z1211 Encounter for screening for malignant neoplasm of colon: Secondary | ICD-10-CM | POA: Diagnosis not present

## 2022-12-14 DIAGNOSIS — E119 Type 2 diabetes mellitus without complications: Secondary | ICD-10-CM | POA: Diagnosis not present

## 2022-12-18 DIAGNOSIS — E039 Hypothyroidism, unspecified: Secondary | ICD-10-CM | POA: Diagnosis not present

## 2022-12-18 DIAGNOSIS — F329 Major depressive disorder, single episode, unspecified: Secondary | ICD-10-CM | POA: Diagnosis not present

## 2022-12-18 DIAGNOSIS — G8929 Other chronic pain: Secondary | ICD-10-CM | POA: Diagnosis not present

## 2022-12-18 DIAGNOSIS — I10 Essential (primary) hypertension: Secondary | ICD-10-CM | POA: Diagnosis not present

## 2022-12-18 DIAGNOSIS — N1831 Chronic kidney disease, stage 3a: Secondary | ICD-10-CM | POA: Diagnosis not present

## 2022-12-18 DIAGNOSIS — E1159 Type 2 diabetes mellitus with other circulatory complications: Secondary | ICD-10-CM | POA: Diagnosis not present

## 2022-12-18 DIAGNOSIS — E785 Hyperlipidemia, unspecified: Secondary | ICD-10-CM | POA: Diagnosis not present

## 2022-12-21 ENCOUNTER — Other Ambulatory Visit: Payer: Self-pay | Admitting: Gastroenterology

## 2022-12-25 DIAGNOSIS — N1831 Chronic kidney disease, stage 3a: Secondary | ICD-10-CM | POA: Diagnosis not present

## 2022-12-25 DIAGNOSIS — E1159 Type 2 diabetes mellitus with other circulatory complications: Secondary | ICD-10-CM | POA: Diagnosis not present

## 2022-12-25 DIAGNOSIS — F329 Major depressive disorder, single episode, unspecified: Secondary | ICD-10-CM | POA: Diagnosis not present

## 2022-12-25 DIAGNOSIS — G8929 Other chronic pain: Secondary | ICD-10-CM | POA: Diagnosis not present

## 2022-12-25 DIAGNOSIS — E785 Hyperlipidemia, unspecified: Secondary | ICD-10-CM | POA: Diagnosis not present

## 2022-12-25 DIAGNOSIS — I10 Essential (primary) hypertension: Secondary | ICD-10-CM | POA: Diagnosis not present

## 2022-12-25 DIAGNOSIS — E039 Hypothyroidism, unspecified: Secondary | ICD-10-CM | POA: Diagnosis not present

## 2023-03-05 ENCOUNTER — Encounter (HOSPITAL_COMMUNITY): Payer: Self-pay | Admitting: Gastroenterology

## 2023-03-12 ENCOUNTER — Other Ambulatory Visit: Payer: Self-pay

## 2023-03-12 ENCOUNTER — Encounter (HOSPITAL_COMMUNITY): Payer: Self-pay | Admitting: Gastroenterology

## 2023-03-12 ENCOUNTER — Ambulatory Visit (HOSPITAL_COMMUNITY): Payer: Medicare Other | Admitting: Certified Registered Nurse Anesthetist

## 2023-03-12 ENCOUNTER — Ambulatory Visit (HOSPITAL_COMMUNITY)
Admission: RE | Admit: 2023-03-12 | Discharge: 2023-03-12 | Disposition: A | Discharge status: Home / Self Care | Payer: Medicare Other | Attending: Gastroenterology | Admitting: Gastroenterology

## 2023-03-12 ENCOUNTER — Ambulatory Visit (HOSPITAL_BASED_OUTPATIENT_CLINIC_OR_DEPARTMENT_OTHER): Payer: Medicare Other | Admitting: Certified Registered Nurse Anesthetist

## 2023-03-12 ENCOUNTER — Encounter (HOSPITAL_COMMUNITY)
Admission: RE | Disposition: A | Discharge status: Home / Self Care | Payer: Self-pay | Source: Home / Self Care | Attending: Gastroenterology

## 2023-03-12 DIAGNOSIS — Z1211 Encounter for screening for malignant neoplasm of colon: Secondary | ICD-10-CM | POA: Insufficient documentation

## 2023-03-12 DIAGNOSIS — Z7902 Long term (current) use of antithrombotics/antiplatelets: Secondary | ICD-10-CM | POA: Insufficient documentation

## 2023-03-12 DIAGNOSIS — K635 Polyp of colon: Secondary | ICD-10-CM | POA: Diagnosis not present

## 2023-03-12 DIAGNOSIS — I10 Essential (primary) hypertension: Secondary | ICD-10-CM

## 2023-03-12 DIAGNOSIS — G4730 Sleep apnea, unspecified: Secondary | ICD-10-CM | POA: Diagnosis not present

## 2023-03-12 DIAGNOSIS — D126 Benign neoplasm of colon, unspecified: Secondary | ICD-10-CM

## 2023-03-12 DIAGNOSIS — E119 Type 2 diabetes mellitus without complications: Secondary | ICD-10-CM | POA: Diagnosis not present

## 2023-03-12 DIAGNOSIS — D122 Benign neoplasm of ascending colon: Secondary | ICD-10-CM | POA: Diagnosis not present

## 2023-03-12 DIAGNOSIS — Z800 Family history of malignant neoplasm of digestive organs: Secondary | ICD-10-CM | POA: Insufficient documentation

## 2023-03-12 DIAGNOSIS — J45909 Unspecified asthma, uncomplicated: Secondary | ICD-10-CM

## 2023-03-12 DIAGNOSIS — Z794 Long term (current) use of insulin: Secondary | ICD-10-CM

## 2023-03-12 DIAGNOSIS — Z139 Encounter for screening, unspecified: Secondary | ICD-10-CM | POA: Diagnosis not present

## 2023-03-12 DIAGNOSIS — Z8673 Personal history of transient ischemic attack (TIA), and cerebral infarction without residual deficits: Secondary | ICD-10-CM | POA: Insufficient documentation

## 2023-03-12 HISTORY — PX: POLYPECTOMY: SHX5525

## 2023-03-12 HISTORY — PX: COLONOSCOPY WITH PROPOFOL: SHX5780

## 2023-03-12 LAB — GLUCOSE, CAPILLARY
Glucose-Capillary: 152 mg/dL — ABNORMAL HIGH (ref 70–99)
Glucose-Capillary: 142 mg/dL — ABNORMAL HIGH (ref 70–99)

## 2023-03-12 SURGERY — COLONOSCOPY WITH PROPOFOL
Anesthesia: Monitor Anesthesia Care

## 2023-03-12 MED ORDER — LACTATED RINGERS IV SOLN: INTRAVENOUS | Status: DC

## 2023-03-12 MED ORDER — SODIUM CHLORIDE 0.9 % IV SOLN: INTRAVENOUS | Status: DC

## 2023-03-12 MED ORDER — PROPOFOL 10 MG/ML IV BOLUS
INTRAVENOUS | Status: AC
  Filled 2023-03-12: qty 20

## 2023-03-12 MED ORDER — PROPOFOL 10 MG/ML IV BOLUS
INTRAVENOUS | Status: DC | PRN
  Administered 2023-03-12 (×2): 20 mg via INTRAVENOUS
  Administered 2023-03-12: 30 mg via INTRAVENOUS
  Administered 2023-03-12: 10 mg via INTRAVENOUS

## 2023-03-12 MED ORDER — PROPOFOL 1000 MG/100ML IV EMUL
INTRAVENOUS | Status: AC
  Filled 2023-03-12: qty 100

## 2023-03-12 MED ORDER — LIDOCAINE 2% (20 MG/ML) 5 ML SYRINGE
INTRAMUSCULAR | Status: DC | PRN
  Administered 2023-03-12: 100 mg via INTRAVENOUS

## 2023-03-12 MED ORDER — PROPOFOL 500 MG/50ML IV EMUL
INTRAVENOUS | Status: DC | PRN
  Administered 2023-03-12: 100 ug/kg/min via INTRAVENOUS

## 2023-03-12 SURGICAL SUPPLY — 22 items

## 2023-03-12 NOTE — Anesthesia Preprocedure Evaluation (Addendum)
Anesthesia Evaluation  Patient identified by MRN, date of birth, ID band Patient awake    Reviewed: Allergy & Precautions, NPO status , Patient's Chart, lab work & pertinent test results, reviewed documented beta blocker date and time   Airway Mallampati: II  TM Distance: >3 FB     Dental no notable dental hx.    Pulmonary asthma , sleep apnea    Pulmonary exam normal        Cardiovascular hypertension, Pt. on medications and Pt. on home beta blockers Normal cardiovascular exam  IMPRESSIONS TEE     1. Left ventricular ejection fraction, by estimation, is 60 to 65%. The  left ventricle has normal function. The left ventricle has no regional  wall motion abnormalities.   2. Right ventricular systolic function is normal. The right ventricular  size is normal.   3. No left atrial/left atrial appendage thrombus was detected.   4. The mitral valve is grossly normal. Mild mitral valve regurgitation.   5. The aortic valve is normal in structure. Aortic valve regurgitation is  trivial.       Neuro/Psych  Headaches PSYCHIATRIC DISORDERS  Depression    CVA (right thalamic and cortical infarct), Residual Symptoms    GI/Hepatic negative GI ROS, Neg liver ROS,,,  Endo/Other  diabetes, Type 2, Insulin DependentHypothyroidism    Renal/GU Renal InsufficiencyRenal diseaseCr 1.36  negative genitourinary   Musculoskeletal negative musculoskeletal ROS (+)    Abdominal   Peds  Hematology negative hematology ROS (+)   Anesthesia Other Findings   Reproductive/Obstetrics negative OB ROS                             Anesthesia Physical Anesthesia Plan  ASA: 3  Anesthesia Plan: MAC   Post-op Pain Management: Minimal or no pain anticipated   Induction:   PONV Risk Score and Plan: 2 and Propofol infusion and TIVA  Airway Management Planned: Natural Airway and Simple Face Mask  Additional Equipment:  None  Intra-op Plan:   Post-operative Plan:   Informed Consent: I have reviewed the patients History and Physical, chart, labs and discussed the procedure including the risks, benefits and alternatives for the proposed anesthesia with the patient or authorized representative who has indicated his/her understanding and acceptance.     Dental advisory given  Plan Discussed with: Anesthesiologist and CRNA  Anesthesia Plan Comments:         Anesthesia Quick Evaluation

## 2023-03-12 NOTE — Anesthesia Postprocedure Evaluation (Signed)
Anesthesia Post Note  Patient: Caitlin Harrington  Procedure(s) Performed: COLONOSCOPY WITH PROPOFOL POLYPECTOMY     Patient location during evaluation: Endoscopy Anesthesia Type: MAC Level of consciousness: awake and alert Pain management: pain level controlled Vital Signs Assessment: post-procedure vital signs reviewed and stable Respiratory status: spontaneous breathing and respiratory function stable Cardiovascular status: stable Postop Assessment: no apparent nausea or vomiting Anesthetic complications: no  No notable events documented.  Last Vitals:  Vitals:   03/12/23 0915 03/12/23 0925  BP: 137/67 (!) 151/78  Pulse: 63 62  Resp: 13 14  Temp:  36.5 C  SpO2: 98% 98%    Last Pain:  Vitals:   03/12/23 0925  TempSrc: Temporal  PainSc: 0-No pain                 Klair Leising DANIEL

## 2023-03-12 NOTE — H&P (Signed)
Angela Cox HPI: At this time the patient denies any problems with nausea, vomiting, fevers, chills, abdominal pain, diarrhea, constipation, hematochezia, melena, GERD, or dysphagia. The patient's sister had colon cancer in her 15's or 84's. No complaints of chest pain, SOB, MI, or sleep apnea.  She has a history of CVA and she takes clopidogrel.  Her current A1C is at 5.6% and previously it was in the 9% range.   Past Medical History:  Diagnosis Date   Asthma    COVID-19 12/2019   Depression    Diabetes mellitus without complication    type 2   Fatigue    Gout    Hypertension    Insomnia    Numbness    Obesity    Paresthesia    Stroke 10/2020   Vitamin D deficiency     Past Surgical History:  Procedure Laterality Date   ABDOMINAL HYSTERECTOMY     BUBBLE STUDY  05/06/2021   Procedure: BUBBLE STUDY;  Surgeon: Vesta Mixer, MD;  Location: [facility] ENDOSCOPY;  Service: Cardiovascular;;   cataracts  2018   TEE WITHOUT CARDIOVERSION N/A 05/06/2021   Procedure: TRANSESOPHAGEAL ECHOCARDIOGRAM (TEE);  Surgeon: Elease Hashimoto Deloris Ping, MD;  Location: [facility] ENDOSCOPY;  Service: Cardiovascular;  Laterality: N/A;    Family History  Problem Relation Age of Onset   Other Mother        brain tumor   Cancer Sister    Other Brother        MVA   Diabetes Maternal Grandfather     Social History:  reports that she has never smoked. She has never used smokeless tobacco. She reports that she does not currently use alcohol. She reports that she does not use drugs.  Allergies:  Allergies  Allergen Reactions   Meloxicam     Other reaction(s): rash   Amlodipine Other (See Comments)    Other reaction(s): headache Other reaction(s): headache   Atorvastatin Hives and Rash    chills Other reaction(s): chills   Doxycycline Rash    Other reaction(s): rash   Ibuprofen Hives    Other reaction(s): hives   Meloxicam Hives and Rash   Statins Hives and Rash    Medications: Scheduled: Continuous:   sodium chloride     lactated ringers      No results found for this or any previous visit (from the past 24 hour(s)).   No results found.  ROS:  As stated above in the HPI otherwise negative.  There were no vitals taken for this visit.    PE: Gen: NAD, Alert and Oriented HEENT:  [place]/AT, EOMI Neck: Supple, no LAD Lungs: CTA Bilaterally CV: RRR without M/G/R ABD: Soft, NTND, +BS Ext: No C/C/E  Assessment/Plan: 1) Family history of colon cancer.  Mateusz Neilan D 03/12/2023, 8:01 AM

## 2023-03-12 NOTE — Transfer of Care (Signed)
Immediate Anesthesia Transfer of Care Note  Patient: Caitlin Harrington  Procedure(s) Performed: COLONOSCOPY WITH PROPOFOL POLYPECTOMY  Patient Location: Endoscopy Unit  Anesthesia Type:MAC  Level of Consciousness: drowsy and patient cooperative  Airway & Oxygen Therapy: Patient Spontanous Breathing and Patient connected to face mask oxygen  Post-op Assessment: Report given to RN and Post -op Vital signs reviewed and stable  Post vital signs: Reviewed and stable  Last Vitals:  Vitals Value Taken Time  BP    Temp    Pulse    Resp    SpO2      Last Pain:  Vitals:   03/12/23 0817  TempSrc: Temporal  PainSc: 0-No pain         Complications: No notable events documented.

## 2023-03-12 NOTE — Op Note (Signed)
[facility] Patient Name: Caitlin Harrington Procedure Date: 03/12/2023 MRN: [id number] Attending MD: Jeani Hawking , MD, [id number] Date of Birth: 12-19-1945 CSN: [id number] Age: 77 Admit Type: Outpatient Procedure:                Colonoscopy Indications:              Screening for colorectal malignant neoplasm Providers:                Jeani Hawking, MD, Pollie Friar RN, RN, Kandice Robinsons, Technician Referring MD:              Medicines:                Propofol per Anesthesia Complications:            No immediate complications. Estimated Blood Loss:     Estimated blood loss: none. Procedure:                Pre-Anesthesia Assessment:                           - Prior to the procedure, a History and Physical                            was performed, and patient medications and                            allergies were reviewed. The patient's tolerance of                            previous anesthesia was also reviewed. The risks                            and benefits of the procedure and the sedation                            options and risks were discussed with the patient.                            All questions were answered, and informed consent                            was obtained. Prior Anticoagulants: The patient has                            taken Plavix (clopidogrel), last dose was 5 days                            prior to procedure. ASA Grade Assessment: III - A                            patient with severe systemic disease. After  reviewing the risks and benefits, the patient was                            deemed in satisfactory condition to undergo the                            procedure.                           - Sedation was administered by an anesthesia                            professional. Deep sedation was attained.                           After obtaining informed consent, the  colonoscope                            was passed under direct vision. Throughout the                            procedure, the patient's blood pressure, pulse, and                            oxygen saturations were monitored continuously. The                            CF-HQ190L ([id number] Olympus colonoscope was                            introduced through the anus and advanced to the the                            cecum, identified by appendiceal orifice and                            ileocecal valve. The colonoscopy was performed                            without difficulty. The patient tolerated the                            procedure well. The quality of the bowel                            preparation was evaluated using the BBPS [facility]                            Bowel Preparation Scale) with scores of: Right                            Colon = 3, Transverse Colon = 3 and Left Colon = 3                            (  entire mucosa seen well with no residual staining,                            small fragments of stool or opaque liquid). The                            total BBPS score equals 9. The ileocecal valve,                            appendiceal orifice, and rectum were photographed. Scope In: 8:35:45 AM Scope Out: 8:55:28 AM Scope Withdrawal Time: 0 hours 14 minutes 0 seconds  Total Procedure Duration: 0 hours 19 minutes 43 seconds  Findings:      Two sessile polyps were found in the ascending colon. The polyps were 2       to 3 mm in size. These polyps were removed with a cold snare. Resection       and retrieval were complete. The first ascending colon polyp was       identified in a retroflexed position. Impression:               - Two 2 to 3 mm polyps in the ascending colon,                            removed with a cold snare. Resected and retrieved. Moderate Sedation:      Not Applicable - Patient had care per Anesthesia. Recommendation:           - Patient has a  contact number available for                            emergencies. The signs and symptoms of potential                            delayed complications were discussed with the                            patient. Return to normal activities tomorrow.                            Written discharge instructions were provided to the                            patient.                           - Resume previous diet.                           - Continue present medications.                           - Await pathology results.                           - Repeat colonoscopy is not recommended for  surveillance.                           - Resume Plavix today. Procedure Code(s):        --- Professional ---                           [phone], Colonoscopy, flexible; with removal of                            tumor(s), polyp(s), or other lesion(s) by snare                            technique Diagnosis Code(s):        --- Professional ---                           D12.2, Benign neoplasm of ascending colon                           Z12.11, Encounter for screening for malignant                            neoplasm of colon CPT copyright 2022 [employer]. All rights reserved. The codes documented in this report are preliminary and upon coder review may  be revised to meet current compliance requirements. Jeani Hawking, MD Jeani Hawking, MD 03/12/2023 9:08:48 AM This report has been signed electronically. Number of Addenda: 0

## 2023-03-15 ENCOUNTER — Encounter (HOSPITAL_COMMUNITY): Payer: Self-pay | Admitting: Gastroenterology

## 2023-03-15 LAB — SURGICAL PATHOLOGY

## 2023-04-02 DIAGNOSIS — E790 Hyperuricemia without signs of inflammatory arthritis and tophaceous disease: Secondary | ICD-10-CM | POA: Diagnosis not present

## 2023-04-02 DIAGNOSIS — J40 Bronchitis, not specified as acute or chronic: Secondary | ICD-10-CM | POA: Diagnosis not present

## 2023-04-02 DIAGNOSIS — I700 Atherosclerosis of aorta: Secondary | ICD-10-CM | POA: Diagnosis not present

## 2023-04-02 DIAGNOSIS — F329 Major depressive disorder, single episode, unspecified: Secondary | ICD-10-CM | POA: Diagnosis not present

## 2023-04-02 DIAGNOSIS — E039 Hypothyroidism, unspecified: Secondary | ICD-10-CM | POA: Diagnosis not present

## 2023-04-02 DIAGNOSIS — Z794 Long term (current) use of insulin: Secondary | ICD-10-CM | POA: Diagnosis not present

## 2023-04-02 DIAGNOSIS — I10 Essential (primary) hypertension: Secondary | ICD-10-CM | POA: Diagnosis not present

## 2023-04-02 DIAGNOSIS — R4189 Other symptoms and signs involving cognitive functions and awareness: Secondary | ICD-10-CM | POA: Diagnosis not present

## 2023-04-02 DIAGNOSIS — E1159 Type 2 diabetes mellitus with other circulatory complications: Secondary | ICD-10-CM | POA: Diagnosis not present

## 2023-04-02 DIAGNOSIS — G8929 Other chronic pain: Secondary | ICD-10-CM | POA: Diagnosis not present

## 2023-04-02 DIAGNOSIS — Z8673 Personal history of transient ischemic attack (TIA), and cerebral infarction without residual deficits: Secondary | ICD-10-CM | POA: Diagnosis not present

## 2023-04-02 DIAGNOSIS — E785 Hyperlipidemia, unspecified: Secondary | ICD-10-CM | POA: Diagnosis not present

## 2023-11-10 ENCOUNTER — Other Ambulatory Visit: Payer: Self-pay

## 2023-11-10 ENCOUNTER — Inpatient Hospital Stay (HOSPITAL_COMMUNITY): Payer: Medicare Other

## 2023-11-10 ENCOUNTER — Emergency Department (HOSPITAL_COMMUNITY): Payer: Medicare Other

## 2023-11-10 ENCOUNTER — Inpatient Hospital Stay (HOSPITAL_COMMUNITY)
Admission: EM | Admit: 2023-11-10 | Discharge: 2023-11-12 | DRG: 086 | Disposition: A | Discharge status: Home Health | Payer: Medicare Other | Attending: Internal Medicine | Admitting: Internal Medicine

## 2023-11-10 ENCOUNTER — Encounter (HOSPITAL_COMMUNITY): Payer: Self-pay

## 2023-11-10 DIAGNOSIS — Z7982 Long term (current) use of aspirin: Secondary | ICD-10-CM | POA: Diagnosis not present

## 2023-11-10 DIAGNOSIS — E66812 Obesity, class 2: Secondary | ICD-10-CM | POA: Diagnosis not present

## 2023-11-10 DIAGNOSIS — R269 Unspecified abnormalities of gait and mobility: Secondary | ICD-10-CM | POA: Diagnosis present

## 2023-11-10 DIAGNOSIS — Z9152 Personal history of nonsuicidal self-harm: Secondary | ICD-10-CM

## 2023-11-10 DIAGNOSIS — M109 Gout, unspecified: Secondary | ICD-10-CM | POA: Diagnosis not present

## 2023-11-10 DIAGNOSIS — I609 Nontraumatic subarachnoid hemorrhage, unspecified: Secondary | ICD-10-CM | POA: Diagnosis not present

## 2023-11-10 DIAGNOSIS — Z886 Allergy status to analgesic agent status: Secondary | ICD-10-CM | POA: Diagnosis not present

## 2023-11-10 DIAGNOSIS — Z7902 Long term (current) use of antithrombotics/antiplatelets: Secondary | ICD-10-CM | POA: Diagnosis not present

## 2023-11-10 DIAGNOSIS — F32A Depression, unspecified: Secondary | ICD-10-CM | POA: Diagnosis present

## 2023-11-10 DIAGNOSIS — S8991XA Unspecified injury of right lower leg, initial encounter: Secondary | ICD-10-CM | POA: Diagnosis not present

## 2023-11-10 DIAGNOSIS — I6782 Cerebral ischemia: Secondary | ICD-10-CM | POA: Diagnosis not present

## 2023-11-10 DIAGNOSIS — Y92009 Unspecified place in unspecified non-institutional (private) residence as the place of occurrence of the external cause: Secondary | ICD-10-CM

## 2023-11-10 DIAGNOSIS — S0003XA Contusion of scalp, initial encounter: Secondary | ICD-10-CM | POA: Diagnosis not present

## 2023-11-10 DIAGNOSIS — F411 Generalized anxiety disorder: Secondary | ICD-10-CM | POA: Diagnosis present

## 2023-11-10 DIAGNOSIS — Z8616 Personal history of COVID-19: Secondary | ICD-10-CM

## 2023-11-10 DIAGNOSIS — R413 Other amnesia: Secondary | ICD-10-CM | POA: Diagnosis not present

## 2023-11-10 DIAGNOSIS — Z833 Family history of diabetes mellitus: Secondary | ICD-10-CM

## 2023-11-10 DIAGNOSIS — Z888 Allergy status to other drugs, medicaments and biological substances status: Secondary | ICD-10-CM | POA: Diagnosis not present

## 2023-11-10 DIAGNOSIS — D72829 Elevated white blood cell count, unspecified: Secondary | ICD-10-CM | POA: Diagnosis present

## 2023-11-10 DIAGNOSIS — Z79899 Other long term (current) drug therapy: Secondary | ICD-10-CM

## 2023-11-10 DIAGNOSIS — S299XXA Unspecified injury of thorax, initial encounter: Secondary | ICD-10-CM | POA: Diagnosis not present

## 2023-11-10 DIAGNOSIS — J45909 Unspecified asthma, uncomplicated: Secondary | ICD-10-CM | POA: Diagnosis not present

## 2023-11-10 DIAGNOSIS — Z7401 Bed confinement status: Secondary | ICD-10-CM | POA: Diagnosis not present

## 2023-11-10 DIAGNOSIS — Z91011 Allergy to milk products: Secondary | ICD-10-CM

## 2023-11-10 DIAGNOSIS — I69311 Memory deficit following cerebral infarction: Secondary | ICD-10-CM | POA: Diagnosis not present

## 2023-11-10 DIAGNOSIS — S0990XA Unspecified injury of head, initial encounter: Secondary | ICD-10-CM | POA: Diagnosis not present

## 2023-11-10 DIAGNOSIS — E039 Hypothyroidism, unspecified: Secondary | ICD-10-CM | POA: Diagnosis not present

## 2023-11-10 DIAGNOSIS — Z7984 Long term (current) use of oral hypoglycemic drugs: Secondary | ICD-10-CM

## 2023-11-10 DIAGNOSIS — Z794 Long term (current) use of insulin: Secondary | ICD-10-CM | POA: Diagnosis not present

## 2023-11-10 DIAGNOSIS — R45851 Suicidal ideations: Secondary | ICD-10-CM | POA: Diagnosis present

## 2023-11-10 DIAGNOSIS — R220 Localized swelling, mass and lump, head: Secondary | ICD-10-CM | POA: Diagnosis not present

## 2023-11-10 DIAGNOSIS — I10 Essential (primary) hypertension: Secondary | ICD-10-CM | POA: Diagnosis present

## 2023-11-10 DIAGNOSIS — Z8673 Personal history of transient ischemic attack (TIA), and cerebral infarction without residual deficits: Secondary | ICD-10-CM

## 2023-11-10 DIAGNOSIS — R229 Localized swelling, mass and lump, unspecified: Secondary | ICD-10-CM | POA: Diagnosis not present

## 2023-11-10 DIAGNOSIS — E559 Vitamin D deficiency, unspecified: Secondary | ICD-10-CM | POA: Diagnosis not present

## 2023-11-10 DIAGNOSIS — S0633AA Contusion and laceration of cerebrum, unspecified, with loss of consciousness status unknown, initial encounter: Secondary | ICD-10-CM | POA: Diagnosis not present

## 2023-11-10 DIAGNOSIS — R824 Acetonuria: Secondary | ICD-10-CM | POA: Diagnosis present

## 2023-11-10 DIAGNOSIS — W19XXXA Unspecified fall, initial encounter: Secondary | ICD-10-CM | POA: Diagnosis present

## 2023-11-10 DIAGNOSIS — I607 Nontraumatic subarachnoid hemorrhage from unspecified intracranial artery: Secondary | ICD-10-CM | POA: Diagnosis not present

## 2023-11-10 DIAGNOSIS — I700 Atherosclerosis of aorta: Secondary | ICD-10-CM | POA: Diagnosis not present

## 2023-11-10 DIAGNOSIS — S066X0A Traumatic subarachnoid hemorrhage without loss of consciousness, initial encounter: Principal | ICD-10-CM | POA: Diagnosis present

## 2023-11-10 DIAGNOSIS — M25561 Pain in right knee: Secondary | ICD-10-CM | POA: Diagnosis not present

## 2023-11-10 DIAGNOSIS — E119 Type 2 diabetes mellitus without complications: Secondary | ICD-10-CM | POA: Diagnosis present

## 2023-11-10 DIAGNOSIS — R609 Edema, unspecified: Secondary | ICD-10-CM | POA: Diagnosis not present

## 2023-11-10 DIAGNOSIS — Z79890 Hormone replacement therapy (postmenopausal): Secondary | ICD-10-CM

## 2023-11-10 DIAGNOSIS — Z9151 Personal history of suicidal behavior: Secondary | ICD-10-CM

## 2023-11-10 DIAGNOSIS — Z9181 History of falling: Secondary | ICD-10-CM

## 2023-11-10 DIAGNOSIS — Z881 Allergy status to other antibiotic agents status: Secondary | ICD-10-CM | POA: Diagnosis not present

## 2023-11-10 DIAGNOSIS — F3289 Other specified depressive episodes: Secondary | ICD-10-CM | POA: Diagnosis not present

## 2023-11-10 DIAGNOSIS — Z6835 Body mass index (BMI) 35.0-35.9, adult: Secondary | ICD-10-CM

## 2023-11-10 DIAGNOSIS — F332 Major depressive disorder, recurrent severe without psychotic features: Secondary | ICD-10-CM | POA: Diagnosis present

## 2023-11-10 DIAGNOSIS — Z6839 Body mass index (BMI) 39.0-39.9, adult: Secondary | ICD-10-CM

## 2023-11-10 DIAGNOSIS — Z90710 Acquired absence of both cervix and uterus: Secondary | ICD-10-CM

## 2023-11-10 DIAGNOSIS — S066XAA Traumatic subarachnoid hemorrhage with loss of consciousness status unknown, initial encounter: Secondary | ICD-10-CM | POA: Diagnosis not present

## 2023-11-10 DIAGNOSIS — R0989 Other specified symptoms and signs involving the circulatory and respiratory systems: Secondary | ICD-10-CM | POA: Diagnosis not present

## 2023-11-10 LAB — CBC WITH DIFFERENTIAL/PLATELET
Abs Immature Granulocytes: 0.06 10*3/uL (ref 0.00–0.07)
Basophils Absolute: 0.1 10*3/uL (ref 0.0–0.1)
Basophils Relative: 0 %
Eosinophils Absolute: 0.5 10*3/uL (ref 0.0–0.5)
Eosinophils Relative: 3 %
HCT: 37.5 % (ref 36.0–46.0)
Hemoglobin: 12.6 g/dL (ref 12.0–15.0)
Immature Granulocytes: 0 %
Lymphocytes Relative: 15 %
Lymphs Abs: 2.4 10*3/uL (ref 0.7–4.0)
MCH: 28.4 pg (ref 26.0–34.0)
MCHC: 33.6 g/dL (ref 30.0–36.0)
MCV: 84.7 fL (ref 80.0–100.0)
Monocytes Absolute: 0.9 10*3/uL (ref 0.1–1.0)
Monocytes Relative: 6 %
Neutro Abs: 12.2 10*3/uL — ABNORMAL HIGH (ref 1.7–7.7)
Neutrophils Relative %: 76 %
Platelets: 265 10*3/uL (ref 150–400)
RBC: 4.43 MIL/uL (ref 3.87–5.11)
RDW: 14.1 % (ref 11.5–15.5)
WBC: 16.1 10*3/uL — ABNORMAL HIGH (ref 4.0–10.5)
nRBC: 0 % (ref 0.0–0.2)

## 2023-11-10 LAB — CBC
HCT: 35.6 % — ABNORMAL LOW (ref 36.0–46.0)
Hemoglobin: 12 g/dL (ref 12.0–15.0)
MCH: 28.6 pg (ref 26.0–34.0)
MCHC: 33.7 g/dL (ref 30.0–36.0)
MCV: 84.8 fL (ref 80.0–100.0)
Platelets: 258 10*3/uL (ref 150–400)
RBC: 4.2 MIL/uL (ref 3.87–5.11)
RDW: 14.1 % (ref 11.5–15.5)
WBC: 11.5 10*3/uL — ABNORMAL HIGH (ref 4.0–10.5)
nRBC: 0 % (ref 0.0–0.2)

## 2023-11-10 LAB — URINALYSIS, W/ REFLEX TO CULTURE (INFECTION SUSPECTED)
Bacteria, UA: NONE SEEN
Bacteria, UA: NONE SEEN
Bilirubin Urine: NEGATIVE
Bilirubin Urine: NEGATIVE
Glucose, UA: >=500 mg/dL — AB
Glucose, UA: >=500 mg/dL — AB
Hgb urine dipstick: NEGATIVE
Hgb urine dipstick: NEGATIVE
Ketones, ur: 5 mg/dL — AB
Ketones, ur: NEGATIVE mg/dL
Leukocytes,Ua: NEGATIVE
Leukocytes,Ua: NEGATIVE
Nitrite: NEGATIVE
Nitrite: NEGATIVE
Protein, ur: NEGATIVE mg/dL
Protein, ur: NEGATIVE mg/dL
Specific Gravity, Urine: 1.017 (ref 1.005–1.030)
Specific Gravity, Urine: 1.014 (ref 1.005–1.030)
pH: 7 (ref 5.0–8.0)
pH: 5 (ref 5.0–8.0)

## 2023-11-10 LAB — HEMOGLOBIN A1C
Hgb A1c MFr Bld: 7 % — ABNORMAL HIGH (ref 4.8–5.6)
Mean Plasma Glucose: 154.2 mg/dL

## 2023-11-10 LAB — COMPREHENSIVE METABOLIC PANEL
ALT: 12 U/L (ref 0–44)
ALT: 12 U/L (ref 0–44)
AST: 24 U/L (ref 15–41)
AST: 23 U/L (ref 15–41)
Albumin: 3.3 g/dL — ABNORMAL LOW (ref 3.5–5.0)
Albumin: 3.1 g/dL — ABNORMAL LOW (ref 3.5–5.0)
Alkaline Phosphatase: 96 U/L (ref 38–126)
Alkaline Phosphatase: 94 U/L (ref 38–126)
Anion gap: 14 (ref 5–15)
Anion gap: 8 (ref 5–15)
BUN: 16 mg/dL (ref 8–23)
BUN: 16 mg/dL (ref 8–23)
CO2: 22 mmol/L (ref 22–32)
CO2: 24 mmol/L (ref 22–32)
Calcium: 9.2 mg/dL (ref 8.9–10.3)
Calcium: 8.9 mg/dL (ref 8.9–10.3)
Chloride: 101 mmol/L (ref 98–111)
Chloride: 105 mmol/L (ref 98–111)
Creatinine, Ser: 0.85 mg/dL (ref 0.44–1.00)
Creatinine, Ser: 1 mg/dL (ref 0.44–1.00)
GFR, Estimated: >60 mL/min (ref >60)
GFR, Estimated: 59 mL/min — ABNORMAL LOW (ref >60)
Glucose, Bld: 144 mg/dL — ABNORMAL HIGH (ref 70–99)
Glucose, Bld: 136 mg/dL — ABNORMAL HIGH (ref 70–99)
Potassium: 4.1 mmol/L (ref 3.5–5.1)
Potassium: 3.9 mmol/L (ref 3.5–5.1)
Sodium: 137 mmol/L (ref 135–145)
Sodium: 137 mmol/L (ref 135–145)
Total Bilirubin: 0.5 mg/dL (ref <1.2)
Total Bilirubin: 0.5 mg/dL (ref <1.2)
Total Protein: 6.8 g/dL (ref 6.5–8.1)
Total Protein: 6.4 g/dL — ABNORMAL LOW (ref 6.5–8.1)

## 2023-11-10 LAB — TSH: TSH: 4.93 u[IU]/mL — ABNORMAL HIGH (ref 0.350–4.500)

## 2023-11-10 LAB — SEDIMENTATION RATE: Sed Rate: 40 mm/h — ABNORMAL HIGH (ref 0–22)

## 2023-11-10 LAB — GLUCOSE, CAPILLARY
Glucose-Capillary: 121 mg/dL — ABNORMAL HIGH (ref 70–99)
Glucose-Capillary: 196 mg/dL — ABNORMAL HIGH (ref 70–99)

## 2023-11-10 LAB — CBG MONITORING, ED
Glucose-Capillary: 129 mg/dL — ABNORMAL HIGH (ref 70–99)
Glucose-Capillary: 112 mg/dL — ABNORMAL HIGH (ref 70–99)

## 2023-11-10 MED ORDER — ONDANSETRON HCL 4 MG PO TABS: 4.0000 mg | ORAL_TABLET | Freq: Four times a day (QID) | ORAL | Status: DC | PRN

## 2023-11-10 MED ORDER — LEVETIRACETAM IN NACL 1500 MG/100ML IV SOLN
1500.0000 mg | Freq: Once | INTRAVENOUS | Status: DC
  Filled 2023-11-10: qty 100

## 2023-11-10 MED ORDER — ESCITALOPRAM OXALATE 10 MG PO TABS
10.0000 mg | ORAL_TABLET | Freq: Every day | ORAL | Status: DC
  Administered 2023-11-11: 10 mg via ORAL
  Filled 2023-11-10 (×2): qty 1

## 2023-11-10 MED ORDER — ALLOPURINOL 100 MG PO TABS
300.0000 mg | ORAL_TABLET | Freq: Every day | ORAL | Status: DC
Start: 2023-11-10 — End: 2023-11-12
  Administered 2023-11-11 – 2023-11-12 (×2): 300 mg via ORAL
  Filled 2023-11-10 (×3): qty 3

## 2023-11-10 MED ORDER — LEVOTHYROXINE SODIUM 50 MCG PO TABS
50.0000 ug | ORAL_TABLET | Freq: Every day | ORAL | Status: DC
  Administered 2023-11-11 – 2023-11-12 (×2): 50 ug via ORAL
  Filled 2023-11-10 (×2): qty 1

## 2023-11-10 MED ORDER — CARVEDILOL 6.25 MG PO TABS
6.2500 mg | ORAL_TABLET | Freq: Two times a day (BID) | ORAL | Status: DC
Start: 2023-11-10 — End: 2023-11-12
  Administered 2023-11-10 – 2023-11-12 (×4): 6.25 mg via ORAL
  Filled 2023-11-10 (×4): qty 1

## 2023-11-10 MED ORDER — TRAMADOL HCL 50 MG PO TABS
25.0000 mg | ORAL_TABLET | Freq: Four times a day (QID) | ORAL | Status: DC | PRN
  Administered 2023-11-10: 25 mg via ORAL
  Filled 2023-11-10: qty 1

## 2023-11-10 MED ORDER — MORPHINE SULFATE (PF) 2 MG/ML IV SOLN: 1.0000 mg | INTRAVENOUS | Status: DC | PRN

## 2023-11-10 MED ORDER — ACETAMINOPHEN 325 MG PO TABS: 650.0000 mg | ORAL_TABLET | Freq: Four times a day (QID) | ORAL | Status: DC | PRN

## 2023-11-10 MED ORDER — LEVETIRACETAM IN NACL 500 MG/100ML IV SOLN
500.0000 mg | Freq: Two times a day (BID) | INTRAVENOUS | Status: DC
  Administered 2023-11-10 – 2023-11-12 (×6): 500 mg via INTRAVENOUS
  Filled 2023-11-10 (×5): qty 100

## 2023-11-10 MED ORDER — ONDANSETRON HCL 4 MG/2ML IJ SOLN: 4.0000 mg | Freq: Four times a day (QID) | INTRAMUSCULAR | Status: DC | PRN

## 2023-11-10 MED ORDER — ALBUTEROL SULFATE (2.5 MG/3ML) 0.083% IN NEBU: 2.5000 mg | INHALATION_SOLUTION | RESPIRATORY_TRACT | Status: DC | PRN

## 2023-11-10 MED ORDER — ORAL CARE MOUTH RINSE: 15.0000 mL | OROMUCOSAL | Status: DC | PRN

## 2023-11-10 MED ORDER — ACETAMINOPHEN 650 MG RE SUPP: 650.0000 mg | Freq: Four times a day (QID) | RECTAL | Status: DC | PRN

## 2023-11-10 MED ORDER — INSULIN ASPART 100 UNIT/ML IJ SOLN
0.0000 [IU] | Freq: Three times a day (TID) | INTRAMUSCULAR | Status: DC
  Administered 2023-11-10 (×2): 1 [IU] via SUBCUTANEOUS
  Administered 2023-11-11: 2 [IU] via SUBCUTANEOUS
  Administered 2023-11-12: 5 [IU] via SUBCUTANEOUS
  Administered 2023-11-12: 1 [IU] via SUBCUTANEOUS

## 2023-11-10 NOTE — Progress Notes (Signed)
? ?  Inpatient Rehab Admissions Coordinator : ? ?Per therapy recommendations, patient was screened for CIR candidacy by Blue Ruggerio RN MSN.  At this time patient appears to be a potential candidate for CIR. I will place a rehab consult per protocol for full assessment. Please call me with any questions. ? ?Jerriyah Louis RN MSN ?Admissions Coordinator ?[phone] ?  ?

## 2023-11-10 NOTE — ED Notes (Signed)
Provider at bedside

## 2023-11-10 NOTE — Consult Note (Signed)
   Providing Compassionate, Quality Care - Together  Neurosurgery Consult  Referring physician: EDP Reason for referral: tSAH   History of Present Illness: Pt presented to ED after a fall. Currently c/o HA. Workup in ED revealing b/l SAH.   Physical Exam:  Vital signs in last 24 hours: Temp:  [98 F (36.7 C)-98.3 F (36.8 C)] 98 F (36.7 C) (07/25 1814) Pulse Rate:  [58-128] 65 (07/26 0746) Resp:  [11-18] 14 (07/26 0217) BP: (138-182)/(65-125) 153/88 (07/26 0700) SpO2:  [91 %-98 %] 96 % (07/26 0746)  PE: Alert, oriented Speech fluent, appropriate CN grossly intact 5/5 BUE/BLE PERRLA Ecchymosis L eye/face   Repeat CTH reviewed showing no significant changes to b/l frontal region L>R SAH.   Impression/Assessment:  This is a 77 yo female with  -acute traumatic b/l SAH/contusions, repeat CTH stable  Plan:  -Continue to hold Plavix/ASA  -Continue Keppra 500mg  bid x7d.  -Call w questions/concerns.   Caitlin Galli Margaree Mackintosh, PA-C

## 2023-11-10 NOTE — Evaluation (Signed)
Occupational Therapy Evaluation Patient Details Name: Caitlin Harrington MRN: [id number] DOB: August 07, 1946 Today's Date: 11/10/2023   History of Present Illness Patient is a 77 year old female presenting after mechanical fall at home with head injury. Found to have small bilateral frontal SAH/contusions. History of falls, memory defict, obesity, HTN, gait disturbance.   Clinical Impression   Prior to this admission, patient living with one of her sons, with her daughter in law managing her medications at baseline, and not driving. Patient reports independence in ADL management and functional mobility, but reports multiple falls in the last six months. Currently, patient presenting with minimal confusion, potential left sided neglect, and inward rotation of LEFT eye when assessing vision. Patient does not recall how she fell, and with decreased STM noted during evaluation. Patient also with need for frequent safety cues in order to navigate rolling walker and environment. Patient mod A for ADL management, and min A for functional mobility. Given patient's support and previous level, OT is recommending intensive therapy > 3 hours a day in order to return to prior level of function. OT will continue to follow.      If plan is discharge home, recommend the following: A little help with walking and/or transfers;A little help with bathing/dressing/bathroom;Direct supervision/assist for medications management;Direct supervision/assist for financial management;Assist for transportation;Help with stairs or ramp for entrance;Supervision due to cognitive status    Functional Status Assessment  Patient has had a recent decline in their functional status and/or demonstrates limited ability to make significant improvements in function in a reasonable and predictable amount of time  Equipment Recommendations  Other (comment) (will continue to assess)    Recommendations for Other Services Rehab consult      Precautions / Restrictions Precautions Precautions: Fall Restrictions Weight Bearing Restrictions Per Provider Order: No      Mobility Bed Mobility Overal bed mobility: Needs Assistance Bed Mobility: Supine to Sit, Sit to Supine     Supine to sit: Min assist Sit to supine: Min assist   General bed mobility comments: trunk support provided. cues for sequencing    Transfers Overall transfer level: Needs assistance Equipment used: Rolling walker (2 wheels) Transfers: Sit to/from Stand Sit to Stand: Min assist           General transfer comment: steadying assistance provided with cues for safety and proper use of rolling walker      Balance Overall balance assessment: Needs assistance Sitting-balance support: Feet supported Sitting balance-Leahy Scale: Fair     Standing balance support: Bilateral upper extremity supported Standing balance-Leahy Scale: Poor Standing balance comment: external support required                           ADL either performed or assessed with clinical judgement   ADL Overall ADL's : Needs assistance/impaired Eating/Feeding: Set up;Sitting   Grooming: Set up;Standing;Wash/dry hands   Upper Body Bathing: Contact guard assist;Sitting   Lower Body Bathing: Moderate assistance;Sitting/lateral leans;Sit to/from stand   Upper Body Dressing : Contact guard assist;Sitting   Lower Body Dressing: Moderate assistance;Sit to/from stand;Sitting/lateral leans   Toilet Transfer: Minimal assistance;Cueing for safety;Cueing for sequencing;Ambulation   Toileting- Clothing Manipulation and Hygiene: Minimal assistance;Sitting/lateral lean;Sit to/from stand       Functional mobility during ADLs: Minimal assistance;Cueing for safety;Cueing for sequencing;Rolling walker (2 wheels) General ADL Comments: Patient presenting with minimal confusion, potential left sided neglect, and inward rotation of LEFT eye when assessing vision. Patient does  not recall how she fell, and with decreased STM noted during evaluation. Patient also with need for frequent safety cues in order to navigate rolling walker and environment. Given patient's support and previous level, OT is recommending intensive therapy > 3 hours a day in order to return to prior level of function. OT will continue to follow.     Vision Baseline Vision/History: 0 No visual deficits Ability to See in Adequate Light: 0 Adequate Patient Visual Report: No change from baseline Vision Assessment?: Yes Eye Alignment: Impaired (comment) (Left eye turning inward) Ocular Range of Motion: Within Functional Limits Alignment/Gaze Preference: Within Defined Limits Tracking/Visual Pursuits: Decreased smoothness of horizontal tracking;Decreased smoothness of vertical tracking Saccades: Impaired - to be further tested in functional context Convergence: Within functional limits (increased repetition to achieve) Additional Comments: Patient frequently running into environment on LEFT side, left eye rotating inward, does not endorse any visual changes, though patient is easily distracted and confused     Perception Perception: Impaired Preception Impairment Details: Inattention/Neglect Perception-Other Comments: Potential left sided inattention   Praxis Praxis: Impaired Praxis Impairment Details: Motor planning Praxis-Other Comments: increased time noted   Pertinent Vitals/Pain Pain Assessment Pain Assessment: Faces Faces Pain Scale: Hurts little more Pain Location: headahce, left side of body Pain Descriptors / Indicators: Discomfort, Guarding Pain Intervention(s): Limited activity within patient's tolerance, Monitored during session, Repositioned     Extremity/Trunk Assessment Upper Extremity Assessment Upper Extremity Assessment: Generalized weakness;LUE deficits/detail;Right hand dominant LUE Deficits / Details: decreased strength on L side throughout LUE, though decreased  attention and effort noticed LUE Coordination: decreased gross motor   Lower Extremity Assessment Lower Extremity Assessment: Defer to PT evaluation LLE Deficits / Details: knee extension 4/5, dorsiflexion 4+/5 LLE Sensation: WNL   Cervical / Trunk Assessment Cervical / Trunk Assessment: Kyphotic (minimally)   Communication Communication Communication: Difficulty following commands/understanding Following commands: Follows one step commands with increased time Cueing Techniques: Verbal cues;Tactile cues   Cognition Arousal: Alert Behavior During Therapy: WFL for tasks assessed/performed, Flat affect Overall Cognitive Status: History of cognitive impairments - at baseline                                 General Comments: oriented to person, situation. patient is able to follow single step commands with increased time and multi modal cues. decreased awareness of recent events and does not remember falling at home.     General Comments  VSS    Exercises     Shoulder Instructions      Home Living Family/patient expects to be discharged to:: Private residence Living Arrangements: Children (recently moved in with son) Available Help at Discharge: Family;Available PRN/intermittently Type of Home: House             Bathroom Shower/Tub: Tub/shower unit         Home Equipment: None   Additional Comments: son reports patient recently moved out from living with her ex-significant other and now lives with the son. he was unable to state how many falls she may have had recently as she has just been with him recently      Prior Functioning/Environment Prior Level of Function : History of Falls (last six months);Independent/Modified Independent             Mobility Comments: occasional assistance required to sit up in bed. patient reports she is ambulatory without device at baseline. she tripped and fell over a rug per  son report. ADLs Comments: family  assists wtih medication management        OT Problem List: Decreased strength;Decreased range of motion;Decreased activity tolerance;Impaired balance (sitting and/or standing);Decreased coordination;Impaired vision/perception;Decreased cognition;Decreased safety awareness;Decreased knowledge of use of DME or AE;Obesity;Pain;Decreased knowledge of precautions      OT Treatment/Interventions: Self-care/ADL training;Therapeutic exercise;Energy conservation;DME and/or AE instruction;Manual therapy;Therapeutic activities;Visual/perceptual remediation/compensation;Cognitive remediation/compensation;Patient/family education;Balance training    OT Goals(Current goals can be found in the care plan section) Acute Rehab OT Goals Patient Stated Goal: to get better OT Goal Formulation: With patient Time For Goal Achievement: 11/24/23 Potential to Achieve Goals: Good  OT Frequency: Min 1X/week    Co-evaluation   Reason for Co-Treatment: Complexity of the patient's impairments (multi-system involvement) PT goals addressed during session: Mobility/safety with mobility OT goals addressed during session: ADL's and self-care;Strengthening/ROM      AM-PAC OT "6 Clicks" Daily Activity     Outcome Measure Help from another person eating meals?: A Little Help from another person taking care of personal grooming?: A Little Help from another person toileting, which includes using toliet, bedpan, or urinal?: A Little Help from another person bathing (including washing, rinsing, drying)?: A Little Help from another person to put on and taking off regular upper body clothing?: A Little Help from another person to put on and taking off regular lower body clothing?: A Lot 6 Click Score: 17   End of Session Equipment Utilized During Treatment: Gait belt;Rolling walker (2 wheels) Nurse Communication: Mobility status  Activity Tolerance: Patient tolerated treatment well Patient left: in bed  OT Visit  Diagnosis: Unsteadiness on feet (R26.81);Other abnormalities of gait and mobility (R26.89);Repeated falls (R29.6);Muscle weakness (generalized) (M62.81);History of falling (Z91.81);Other symptoms and signs involving cognitive function;Pain                Time: 0912-0932 OT Time Calculation (min): 20 min Charges:  OT General Charges $OT Visit: 1 Visit OT Evaluation $OT Eval Moderate Complexity: 1 Mod  Pollyann Glen E. Amatullah Christy, OTR/L Acute Rehabilitation Services [phone]   Cherlyn Cushing 11/10/2023, 1:21 PM

## 2023-11-10 NOTE — ED Notes (Signed)
ED TO INPATIENT HANDOFF REPORT  ED Nurse Name and Phone #:  Angelica Chessman, 1610  S Name/Age/Gender Caitlin Harrington 77 y.o. female Room/Bed: H018C/H018C  Code Status   Code Status: Full Code  Home/SNF/Other Home Patient oriented to: self, place, time, and situation Is this baseline? Yes   Triage Complete: Triage complete  Chief Complaint SAH (subarachnoid hemorrhage) (HCC) [I60.9] Subarachnoid hemorrhage (HCC) [I60.9]  Triage Note Patient trip and fall at home. Husband called ems. Patient with a large hematoma to left forehead. GCS 14.   Allergies Allergies  Allergen Reactions   Lactose Intolerance (Gi) Nausea And Vomiting   Meloxicam     Other reaction(s): rash   Amlodipine Other (See Comments)    Other reaction(s): headache Other reaction(s): headache   Atorvastatin Hives and Rash    chills Other reaction(s): chills   Doxycycline Rash    Other reaction(s): rash   Ibuprofen Hives    Other reaction(s): hives   Meloxicam Hives and Rash   Statins Hives and Rash    Level of Care/Admitting Diagnosis ED Disposition     ED Disposition  Admit   Condition  --   Comment  Hospital Area: [facility] [[id number]]  Level of Care: Telemetry Medical [104]  May admit patient to [facility] or [facility] if equivalent level of care is available:: No  Covid Evaluation: Asymptomatic - no recent exposure (last 10 days) testing not required  Diagnosis: Subarachnoid hemorrhage (HCC) [430.ICD-9-CM]  Admitting Physician: Leatha Gilding [[id number]]  Attending Physician: Leatha Gilding [phone]  Certification:: I certify this patient will need inpatient services for at least 2 midnights          B Medical/Surgery History Past Medical History:  Diagnosis Date   Asthma    COVID-19 12/2019   Depression    Diabetes mellitus without complication (HCC)    type 2   Fatigue    Gout    Hypertension    Insomnia    Numbness    Obesity    Paresthesia    Stroke (HCC)  10/2020   Vitamin D deficiency    Past Surgical History:  Procedure Laterality Date   ABDOMINAL HYSTERECTOMY     BUBBLE STUDY  05/06/2021   Procedure: BUBBLE STUDY;  Surgeon: Vesta Mixer, MD;  Location: [facility] ENDOSCOPY;  Service: Cardiovascular;;   cataracts  2018   COLONOSCOPY WITH PROPOFOL N/A 03/12/2023   Procedure: COLONOSCOPY WITH PROPOFOL;  Surgeon: Jeani Hawking, MD;  Location: [facility] ENDOSCOPY;  Service: Gastroenterology;  Laterality: N/A;   POLYPECTOMY  03/12/2023   Procedure: POLYPECTOMY;  Surgeon: Jeani Hawking, MD;  Location: [facility] ENDOSCOPY;  Service: Gastroenterology;;  x 2 cold snare   TEE WITHOUT CARDIOVERSION N/A 05/06/2021   Procedure: TRANSESOPHAGEAL ECHOCARDIOGRAM (TEE);  Surgeon: Elease Hashimoto Deloris Ping, MD;  Location: [facility] ENDOSCOPY;  Service: Cardiovascular;  Laterality: N/A;     A IV Location/Drains/Wounds Patient Lines/Drains/Airways Status     Active Line/Drains/Airways     Name Placement date Placement time Site Days   Peripheral IV 11/10/23 20 G Left Antecubital 11/10/23  0015  Antecubital   less than 1            Intake/Output Last 24 hours  Intake/Output Summary (Last 24 hours) at 11/10/2023 1510 Last data filed at 11/10/2023 0345 Gross per 24 hour  Intake 100 ml  Output --  Net 100 ml    Labs/Imaging Results for orders placed or performed during the hospital encounter of 11/10/23 (from the  past 48 hours)  CBC with Differential     Status: Abnormal   Collection Time: 11/10/23  2:00 AM  Result Value Ref Range   WBC 16.1 (H) 4.0 - 10.5 K/uL   RBC 4.43 3.87 - 5.11 MIL/uL   Hemoglobin 12.6 12.0 - 15.0 g/dL   HCT 81.1 91.4 - 78.2 %   MCV 84.7 80.0 - 100.0 fL   MCH 28.4 26.0 - 34.0 pg   MCHC 33.6 30.0 - 36.0 g/dL   RDW 95.6 21.3 - 08.6 %   Platelets 265 150 - 400 K/uL   nRBC 0.0 0.0 - 0.2 %   Neutrophils Relative % 76 %   Neutro Abs 12.2 (H) 1.7 - 7.7 K/uL   Lymphocytes Relative 15 %   Lymphs Abs 2.4 0.7 - 4.0 K/uL   Monocytes Relative 6 %    Monocytes Absolute 0.9 0.1 - 1.0 K/uL   Eosinophils Relative 3 %   Eosinophils Absolute 0.5 0.0 - 0.5 K/uL   Basophils Relative 0 %   Basophils Absolute 0.1 0.0 - 0.1 K/uL   Immature Granulocytes 0 %   Abs Immature Granulocytes 0.06 0.00 - 0.07 K/uL    Comment: Performed at [facility], [address] [place], [place] [address]  Comprehensive metabolic panel     Status: Abnormal   Collection Time: 11/10/23  2:00 AM  Result Value Ref Range   Sodium 137 135 - 145 mmol/L   Potassium 4.1 3.5 - 5.1 mmol/L   Chloride 101 98 - 111 mmol/L   CO2 22 22 - 32 mmol/L   Glucose, Bld 144 (H) 70 - 99 mg/dL    Comment: Glucose reference range applies only to samples taken after fasting for at least 8 hours.   BUN 16 8 - 23 mg/dL   Creatinine, Ser 9.62 0.44 - 1.00 mg/dL   Calcium 9.2 8.9 - 95.2 mg/dL   Total Protein 6.8 6.5 - 8.1 g/dL   Albumin 3.3 (L) 3.5 - 5.0 g/dL   AST 24 15 - 41 U/L   ALT 12 0 - 44 U/L   Alkaline Phosphatase 96 38 - 126 U/L   Total Bilirubin 0.5 <1.2 mg/dL   GFR, Estimated >84 >13 mL/min    Comment: (NOTE) Calculated using the CKD-EPI Creatinine Equation (2021)    Anion gap 14 5 - 15    Comment: Performed at [facility], [address] [place], [place] [address]  Urinalysis, w/ Reflex to Culture (Infection Suspected) -Urine, Clean Catch     Status: Abnormal   Collection Time: 11/10/23  3:05 AM  Result Value Ref Range   Specimen Source URINE, CLEAN CATCH    Color, Urine YELLOW YELLOW   APPearance CLEAR CLEAR   Specific Gravity, Urine 1.017 1.005 - 1.030   pH 7.0 5.0 - 8.0   Glucose, UA >=500 (A) NEGATIVE mg/dL   Hgb urine dipstick NEGATIVE NEGATIVE   Bilirubin Urine NEGATIVE NEGATIVE   Ketones, ur 5 (A) NEGATIVE mg/dL   Protein, ur NEGATIVE NEGATIVE mg/dL   Nitrite NEGATIVE NEGATIVE   Leukocytes,Ua NEGATIVE NEGATIVE   RBC / HPF 0-5 0 - 5 RBC/hpf   WBC, UA 0-5 0 - 5 WBC/hpf    Comment:        Reflex urine culture not performed if WBC <=10, OR if  Squamous epithelial cells >5. If Squamous epithelial cells >5 suggest recollection.    Bacteria, UA NONE SEEN NONE SEEN   Squamous Epithelial / HPF  0-5 0 - 5 /HPF    Comment: Performed at [facility], [address] [place], [place] [address]  Hemoglobin A1c     Status: Abnormal   Collection Time: 11/10/23  6:50 AM  Result Value Ref Range   Hgb A1c MFr Bld 7.0 (H) 4.8 - 5.6 %    Comment: (NOTE) Pre diabetes:          5.7%-6.4%  Diabetes:              >6.4%  Glycemic control for   <7.0% adults with diabetes    Mean Plasma Glucose 154.2 mg/dL    Comment: Performed at [facility], [address] [place], [place] [address]  TSH     Status: Abnormal   Collection Time: 11/10/23  6:50 AM  Result Value Ref Range   TSH 4.930 (H) 0.350 - 4.500 uIU/mL    Comment: Performed by a 3rd Generation assay with a functional sensitivity of <=0.01 uIU/mL. Performed at [facility], [address], [place], [place] [address]   Comprehensive metabolic panel     Status: Abnormal   Collection Time: 11/10/23  6:50 AM  Result Value Ref Range   Sodium 137 135 - 145 mmol/L   Potassium 3.9 3.5 - 5.1 mmol/L   Chloride 105 98 - 111 mmol/L   CO2 24 22 - 32 mmol/L   Glucose, Bld 136 (H) 70 - 99 mg/dL    Comment: Glucose reference range applies only to samples taken after fasting for at least 8 hours.   BUN 16 8 - 23 mg/dL   Creatinine, Ser 4.40 0.44 - 1.00 mg/dL   Calcium 8.9 8.9 - 10.2 mg/dL   Total Protein 6.4 (L) 6.5 - 8.1 g/dL   Albumin 3.1 (L) 3.5 - 5.0 g/dL   AST 23 15 - 41 U/L   ALT 12 0 - 44 U/L   Alkaline Phosphatase 94 38 - 126 U/L   Total Bilirubin 0.5 <1.2 mg/dL   GFR, Estimated 59 (L) >60 mL/min    Comment: (NOTE) Calculated using the CKD-EPI Creatinine Equation (2021)    Anion gap 8 5 - 15    Comment: Performed at [facility], [address] [place], [place] [address]  CBC     Status: Abnormal   Collection Time: 11/10/23  6:50 AM  Result Value Ref Range    WBC 11.5 (H) 4.0 - 10.5 K/uL   RBC 4.20 3.87 - 5.11 MIL/uL   Hemoglobin 12.0 12.0 - 15.0 g/dL   HCT 64.4 (L) 03.4 - 74.2 %   MCV 84.8 80.0 - 100.0 fL   MCH 28.6 26.0 - 34.0 pg   MCHC 33.7 30.0 - 36.0 g/dL   RDW 59.5 63.8 - 75.6 %   Platelets 258 150 - 400 K/uL   nRBC 0.0 0.0 - 0.2 %    Comment: Performed at [facility], [address] [place], [place] [address]  Sedimentation rate     Status: Abnormal   Collection Time: 11/10/23  6:50 AM  Result Value Ref Range   Sed Rate 40 (H) 0 - 22 mm/hr    Comment: Performed at [facility], [address] [place], [place] [address]  CBG monitoring, ED     Status: Abnormal   Collection Time: 11/10/23  8:18 AM  Result Value Ref Range   Glucose-Capillary 129 (H) 70 - 99 mg/dL    Comment: Glucose reference range  applies only to samples taken after fasting for at least 8 hours.  CBG monitoring, ED     Status: Abnormal   Collection Time: 11/10/23  1:25 PM  Result Value Ref Range   Glucose-Capillary 112 (H) 70 - 99 mg/dL    Comment: Glucose reference range applies only to samples taken after fasting for at least 8 hours.   CT HEAD WO CONTRAST ( ) Result Date: 11/10/2023 CLINICAL DATA:  Subarachnoid hemorrhage [facility]) EXAM: CT HEAD WITHOUT CONTRAST TECHNIQUE: Contiguous axial images were obtained from the base of the skull through the vertex without intravenous contrast. RADIATION DOSE REDUCTION: This exam was performed according to the departmental dose-optimization program which includes automated exposure control, adjustment of the mA and/or kV according to patient size and/or use of iterative reconstruction technique. COMPARISON:  Head CT 11/10/2023 FINDINGS: Brain: Redemonstrated subarachnoid hemorrhage along the anterior left frontal lobe and posterior right frontal lobe., Unchanged from prior exam. No new sites of hemorrhage identified. No mass effect. No evidence of intraventricular extension. No hydrocephalus. No CT evidence of  an acute cortical infarct. Redemonstrated chronic right thalamic infarct. There is a background of moderate to severe chronic microvascular ischemic change. Vascular: No hyperdense vessel or unexpected calcification. Skull: Redemonstrated soft tissue hematoma along the frontal scalp and nasal bridge. Sinuses/Orbits: No middle ear or mastoid effusion. Paranasal sinuses are clear. Bilateral lens replacement. Orbits are otherwise unremarkable. Other: None. IMPRESSION: Unchanged subarachnoid hemorrhage along the anterior left frontal lobe and posterior right frontal lobe. No new sites of hemorrhage identified. No mass effect. Electronically Signed   By: Lorenza Cambridge M.D.   On: 11/10/2023 10:52   CT Cervical Spine Wo Contrast Result Date: 11/10/2023 CLINICAL DATA:  Fall, head injury, scalp hematoma EXAM: CT CERVICAL SPINE WITHOUT CONTRAST TECHNIQUE: Multidetector CT imaging of the cervical spine was performed without intravenous contrast. Multiplanar CT image reconstructions were also generated. RADIATION DOSE REDUCTION: This exam was performed according to the departmental dose-optimization program which includes automated exposure control, adjustment of the mA and/or kV according to patient size and/or use of iterative reconstruction technique. COMPARISON:  None Available. FINDINGS: Alignment: Normal. Skull base and vertebrae: Grade cervical alignment is normal. The atlantodental interval is not widened. No acute fracture of the cervical spine. Vertebral body height is preserved. Soft tissues and spinal canal: No prevertebral fluid or swelling. No visible canal hematoma. Disc levels: Disc space narrowing and endplate remodeling is seen throughout the cervical spine most severe at C5-C7 in keeping with changes of moderate to severe degenerative disc disease. No significant canal stenosis. Multilevel uncovertebral and facet arthrosis results in multilevel moderate to severe neuroforaminal narrowing, most severe on  the right at C3-4 and C5-6 and on the left at C5-6 and C6-7 Upper chest: Negative. Other: None IMPRESSION: 1. No acute fracture or listhesis of the cervical spine. 2. Multilevel degenerative disc and degenerative joint disease resulting in multilevel moderate to severe neuroforaminal narrowing, most severe on the right at C3-4 and C5-6 and on the left at C5-6 and C6-7. Electronically Signed   By: Helyn Numbers M.D.   On: 11/10/2023 02:14   CT Maxillofacial Wo Contrast Result Date: 11/10/2023 CLINICAL DATA:  Blunt facial trauma EXAM: CT MAXILLOFACIAL WITHOUT CONTRAST TECHNIQUE: Multidetector CT imaging of the maxillofacial structures was performed. Multiplanar CT image reconstructions were also generated. RADIATION DOSE REDUCTION: This exam was performed according to the departmental dose-optimization program which includes automated exposure control, adjustment of the mA and/or kV according to patient size and/or use  of iterative reconstruction technique. COMPARISON:  None Available. FINDINGS: Osseous: No fracture or mandibular dislocation. No destructive process. Orbits: Moderate left preseptal soft tissue swelling. Ocular globes are intact. Ocular lenses have been removed. Retro-orbital fat is preserved. No retro-orbital mass lesion hematoma identified. Extraocular musculature and optic nerves are unremarkable. Sinuses: Clear. Soft tissues: Moderate soft tissue swelling noted over the nasal bridge. Moderate scalp hematoma noted superficial to the left frontal bone. Limited intracranial: Mild subarachnoid hemorrhage is partially visualized within the left parafalcine frontal lobe, better seen on concurrently performed CT examination of the head. IMPRESSION: 1. No acute facial bone fracture. 2. Moderate left preseptal soft tissue swelling. 3. Moderate soft tissue swelling over the nasal bridge. 4. Moderate scalp hematoma superficial to the left frontal bone. 5. Mild subarachnoid hemorrhage partially visualized  within the left parafalcine frontal lobe, better seen on concurrently performed CT examination of the head. Electronically Signed   By: Helyn Numbers M.D.   On: 11/10/2023 02:01   CT Head Wo Contrast Result Date: 11/10/2023 CLINICAL DATA:  Fall EXAM: CT HEAD WITHOUT CONTRAST TECHNIQUE: Contiguous axial images were obtained from the base of the skull through the vertex without intravenous contrast. RADIATION DOSE REDUCTION: This exam was performed according to the departmental dose-optimization program which includes automated exposure control, adjustment of the mA and/or kV according to patient size and/or use of iterative reconstruction technique. COMPARISON:  None Available. FINDINGS: Brain: There is a small amount of traumatic subarachnoid hemorrhage over both convexities, left-greater-than-right. Hypoattenuation of the white matter. Mild volume loss Vascular: There is atherosclerotic calcification of both internal carotid arteries at the skull base. Skull: Large left frontal scalp hematoma.  No skull fracture Sinuses/Orbits: No acute finding. Other: None. IMPRESSION: 1. Small amount of traumatic subarachnoid hemorrhage over both convexities, left-greater-than-right. 2. Large left frontal scalp hematoma without skull fracture. Critical Value/emergent results were called by telephone at the time of interpretation on 11/10/2023 at 1:47 am to provider [facility] , who verbally acknowledged these results. Electronically Signed   By: Deatra Robinson M.D.   On: 11/10/2023 01:47   DG Knee Complete 4 Views Right Result Date: 11/10/2023 CLINICAL DATA:  Tripped and fell at home. Large hematoma to left forehead. Pain to right knee. EXAM: RIGHT KNEE - COMPLETE 4+ VIEW COMPARISON:  None Available. FINDINGS: No evidence of fracture, dislocation, or joint effusion. No evidence of arthropathy or other focal bone abnormality. Soft tissues are unremarkable. IMPRESSION: Negative. Electronically Signed   By: Minerva Fester  M.D.   On: 11/10/2023 01:45   DG Chest 2 View Result Date: 11/10/2023 CLINICAL DATA:  Tripped and fell at home. Large hematoma to left forehead. Pain to right knee. EXAM: CHEST - 2 VIEW COMPARISON:  05/25/2021 FINDINGS: Stable cardiomediastinal silhouette. Aortic atherosclerotic calcification. Low lung volumes. No focal consolidation, pleural effusion, or pneumothorax. No displaced rib fractures. IMPRESSION: No acute cardiopulmonary disease. Electronically Signed   By: Minerva Fester M.D.   On: 11/10/2023 01:44    Pending Labs Unresulted Labs (From admission, onward)     Start     Ordered   11/11/23 0500  Comprehensive metabolic panel  Tomorrow morning,   R        11/10/23 0654   11/11/23 0500  CBC  Tomorrow morning,   R        11/10/23 0654   11/11/23 0500  Magnesium  Tomorrow morning,   R        11/10/23 0654   11/10/23 0715  C-reactive  protein  Once,   R        11/10/23 0715   11/10/23 0528  Urinalysis, w/ Reflex to Culture (Infection Suspected) -Urine, Clean Catch  (Urine Culture)  Once,   R       Question:  Specimen Source  Answer:  Urine, Clean Catch   11/10/23 0529            Vitals/Pain Today's Vitals   11/10/23 1400 11/10/23 1415 11/10/23 1430 11/10/23 1510  BP: 130/71 120/64 115/66   Pulse: 62 72 65   Resp: 12 12 11    Temp:    97.7 F (36.5 C)  TempSrc:    Oral  SpO2: 97% 96% 95%   PainSc:        Isolation Precautions No active isolations  Medications Medications  levETIRAcetam (KEPPRA) IVPB 500 mg/100 mL premix (0 mg Intravenous Stopped 11/10/23 1136)  allopurinol (ZYLOPRIM) tablet 300 mg (300 mg Oral Not Given 11/10/23 1333)  insulin aspart (novoLOG) injection 0-9 Units ( Subcutaneous Not Given 11/10/23 1331)  acetaminophen (TYLENOL) tablet 650 mg (has no administration in time range)    Or  acetaminophen (TYLENOL) suppository 650 mg (has no administration in time range)  ondansetron (ZOFRAN) tablet 4 mg (has no administration in time range)    Or   ondansetron (ZOFRAN) injection 4 mg (has no administration in time range)  morphine (PF) 2 MG/ML injection 1 mg (has no administration in time range)  albuterol (PROVENTIL) (2.5 MG/3ML) 0.083% nebulizer solution 2.5 mg (has no administration in time range)  carvedilol (COREG) tablet 6.25 mg (has no administration in time range)  escitalopram (LEXAPRO) tablet 10 mg (10 mg Oral Not Given 11/10/23 1333)  levothyroxine (SYNTHROID) tablet 50 mcg (has no administration in time range)    Mobility walks     Focused Assessments Neuro Assessment Handoff:  Swallow screen pass? Yes          Neuro Assessment: Within Defined Limits Neuro Checks:      Has TPA been given? No If patient is a Neuro Trauma and patient is going to OR before floor call report to 4N Charge nurse: [phone] or [phone]   R Recommendations: See Admitting Provider Note  Report given to:   Additional Notes: Pt is AO, walky talky, has 2 black eyes, sore knees, her repeat CT did not show any worsening bleed, has not ate breakfast or lunch, has been encouraged to, walked with PT and a walker this am, has been drowsy, family at bedside.

## 2023-11-10 NOTE — Progress Notes (Signed)
Pt presented to ED after a fall. H/o CVA, on Plavix and ASA. No focal deficits per EDP. CTH revealing small b/l frontal SAH/contusions. Recommend admission, hold Plavix/ASA, repeat CTH 8H, Keppra 500mg  bid x7d. Formal consult to follow.   Varick Keys Margaree Mackintosh, PA-C

## 2023-11-10 NOTE — ED Notes (Signed)
Pt left the floor in stable condition, AOX4, with her belongings, family and staff.

## 2023-11-10 NOTE — ED Notes (Signed)
Patient walked to the restroom and back to bed with minimal assistance. U/a collected and sent.

## 2023-11-10 NOTE — Plan of Care (Signed)

## 2023-11-10 NOTE — H&P (Addendum)
History and Physical    Caitlin Harrington:[id number] DOB: 1946/06/28 DOA: 11/10/2023  PCP: Ileana Ladd, MD (Inactive)  Patient coming from: home  I have personally briefly reviewed patient's old medical records in [facility]  Chief Complaint: fall with head injury  HPI: Caitlin Harrington is a 77 y.o. female with medical history significant of  Asthma, DMII, Depression, Gout, CVA  on asa/ Plavix with history of residual memory deficit, Obesity , Vit D def, HTN, Hypothyroidism, gait disturbance, who presents to ED s/p mechanical fall  at home with head injury. Patient notes she does not recall much about the fall. She does note that she has had 3 prior falls in the last 6 months. Patient's significant other is not available at this time to assist with history. Details regarding fall is taken from chart review. Patient currently notes  HA, and body aches, she denies cough/n/v/fever /chills but does admit to cold intolerance.  She notes no chest pain or sob.  ED Course:  Vitals: afeb, bp 132/59, hr 70, rr 16 sat 99% on ra  CTH IMPRESSION: 1. Small amount of traumatic subarachnoid hemorrhage over both convexities, left-greater-than-right. 2. Large left frontal scalp hematoma without skull fracture.   Ct cervical spine Xray  right kneeNAD Cxr:NAD  CT maxillofacial IMPRESSION: 1. No acute facial bone fracture. 2. Moderate left preseptal soft tissue swelling. 3. Moderate soft tissue swelling over the nasal bridge. 4. Moderate scalp hematoma superficial to the left frontal bone. 5. Mild subarachnoid hemorrhage partially visualized within the left parafalcine frontal lobe, better seen on concurrently performed CT examination of the head.  Wbc 16, hgb 12.6, plt 265, increase pmn Na 137, K 4.1, cl 101, cr 0.85 EKG: nsr, LAD nonspecific changes UA neg  S/p keppra load 500 mg load Per neurosurgery   -repeat ct in 8 hours -continue with 500mg  bid  Keppra x 7 days -hold  asa/plavix   Review of Systems: As per HPI otherwise 10 point review of systems negative.   Past Medical History:  Diagnosis Date   Asthma    COVID-19 12/2019   Depression    Diabetes mellitus without complication (HCC)    type 2   Fatigue    Gout    Hypertension    Insomnia    Numbness    Obesity    Paresthesia    Stroke (HCC) 10/2020   Vitamin D deficiency     Past Surgical History:  Procedure Laterality Date   ABDOMINAL HYSTERECTOMY     BUBBLE STUDY  05/06/2021   Procedure: BUBBLE STUDY;  Surgeon: Vesta Mixer, MD;  Location[facility] ENDOSCOPY;  Service: Cardiovascular;;   cataracts  2018   COLONOSCOPY WITH PROPOFOL N/A 03/12/2023   Procedure: COLONOSCOPY WITH PROPOFOL;  Surgeon: Jeani Hawking, MD;  Location: [facility] ENDOSCOPY;  Service: Gastroenterology;  Laterality: N/A;   POLYPECTOMY  03/12/2023   Procedure: POLYPECTOMY;  Surgeon: Jeani Hawking, MD;  Location: [facility] ENDOSCOPY;  Service: Gastroenterology;;  x 2 cold snare   TEE WITHOUT CARDIOVERSION N/A 05/06/2021   Procedure: TRANSESOPHAGEAL ECHOCARDIOGRAM (TEE);  Surgeon: Elease Hashimoto Deloris Ping, MD;  Location: [facility] ENDOSCOPY;  Service: Cardiovascular;  Laterality: N/A;     reports that she has never smoked. She has never used smokeless tobacco. She reports that she does not currently use alcohol. She reports that she does not use drugs.  Allergies  Allergen Reactions   Meloxicam     Other reaction(s): rash   Amlodipine Other (See Comments)  Other reaction(s): headache Other reaction(s): headache   Atorvastatin Hives and Rash    chills Other reaction(s): chills   Doxycycline Rash    Other reaction(s): rash   Ibuprofen Hives    Other reaction(s): hives   Meloxicam Hives and Rash   Statins Hives and Rash    Family History  Problem Relation Age of Onset   Other Mother        brain tumor   Cancer Sister    Other Brother        MVA   Diabetes Maternal Grandfather     Prior to Admission medications   Medication Sig  Start Date End Date Taking? Authorizing Provider  albuterol (VENTOLIN HFA) 108 (90 Base) MCG/ACT inhaler Inhale 2 puffs into the lungs 2 (two) times daily as needed for shortness of breath or wheezing. Patient not taking: Reported on 01/02/2022 05/15/20   [provider]  allopurinol (ZYLOPRIM) 300 MG tablet Take 300 mg by mouth daily.    [provider]  amLODipine (NORVASC) 5 MG tablet Take 1 tablet (5 mg total) by mouth daily. 05/28/21   Catarina Hartshorn, MD  aspirin EC 81 MG EC tablet Take 1 tablet (81 mg total) by mouth daily. Swallow whole. 10/31/20   Osvaldo Shipper, MD  carvedilol (COREG) 6.25 MG tablet Take 6.25 mg by mouth 2 (two) times daily with a meal.    [provider]  clopidogrel (PLAVIX) 75 MG tablet Take 1 tablet (75 mg total) by mouth daily. 05/28/21   Catarina Hartshorn, MD  EUTHYROX 25 MCG tablet Take 25 mcg by mouth daily. 05/08/21   [provider]  hydrOXYzine (ATARAX/VISTARIL) 25 MG tablet Take 25 mg by mouth daily as needed for anxiety. 03/13/19   [provider]  insulin glargine (LANTUS) 100 UNIT/ML Solostar Pen Inject 15 Units into the skin daily. Patient not taking: Reported on 03/05/2023    [provider]  losartan (COZAAR) 25 MG tablet Take 1 tablet (25 mg total) by mouth daily. 05/27/21   Catarina Hartshorn, MD  metFORMIN (GLUCOPHAGE) 500 MG tablet Take 500 mg by mouth daily.    [provider]  PARoxetine (PAXIL) 40 MG tablet Take 40 mg by mouth every morning.    [provider]  rosuvastatin (CRESTOR) 20 MG tablet Take 1 tablet (20 mg total) by mouth daily. 05/07/21   Pokhrel, Rebekah Chesterfield, MD  sertraline (ZOLOFT) 50 MG tablet Take 50 mg by mouth daily.    [provider]  sitaGLIPtin (JANUVIA) 100 MG tablet Take 100 mg by mouth daily. 11/07/20   [provider]  terazosin (HYTRIN) 5 MG capsule Take 5 mg by mouth daily.    [provider]  UNABLE TO FIND 1 tablet daily. Med Name: centrum gummies     [provider]    Physical Exam: Vitals:   11/10/23 0100 11/10/23 0318  BP: (!) 132/59 (!) 148/73  Pulse: 71 73  Resp: 16 16  Temp: 97.7 F (36.5 C)   TempSrc: Oral   SpO2: 99% 97%    Constitutional: NAD, calm, comfortable Vitals:   11/10/23 0100 11/10/23 0318  BP: (!) 132/59 (!) 148/73  Pulse: 71 73  Resp: 16 16  Temp: 97.7 F (36.5 C)   TempSrc: Oral   SpO2: 99% 97%   Eyes: PERRL, lids and conjunctivae normal/  noted ecchymosis of eyelid HENMT: Mucous membranes are moist. Normal dentition. Noted b/l frontal contusions Neck: normal, supple, no masses, no thyromegaly Respiratory: clear to auscultation  bilaterally, no wheezing, no crackles. Normal respiratory effort. No accessory muscle use.  Cardiovascular: Regular rate and rhythm, no murmurs / rubs / gallops. No extremity edema. 2+ pedal pulses.   Abdomen: no tenderness, no masses palpated. No hepatosplenomegaly. Bowel sounds positive.  Musculoskeletal: no clubbing / cyanosis. No joint deformity upper and lower extremities. Good ROM, no contractures. Normal muscle tone.  Skin: no rashes, lesions, ulcers. No induration Neurologic: CN 2-12 grossly intact. Sensation intact,  Strength 5/5 in all 4.  Psychiatric: Normal judgment and insight. Alert and oriented . Normal mood.    Labs on Admission: I have personally reviewed following labs and imaging studies  CBC: Recent Labs  Lab 11/10/23 0200  WBC 16.1*  NEUTROABS 12.2*  HGB 12.6  HCT 37.5  MCV 84.7  PLT 265   Basic Metabolic Panel: Recent Labs  Lab 11/10/23 0200  NA 137  K 4.1  CL 101  CO2 22  GLUCOSE 144*  BUN 16  CREATININE 0.85  CALCIUM 9.2   GFR: CrCl cannot be calculated (Unknown ideal weight.). Liver Function Tests: Recent Labs  Lab 11/10/23 0200  AST 24  ALT 12  ALKPHOS 96  BILITOT 0.5  PROT 6.8  ALBUMIN 3.3*   No results for input(s): "LIPASE", "AMYLASE" in the last 168 hours. No results for input(s): "AMMONIA" in the last  168 hours. Coagulation Profile: No results for input(s): "INR", "PROTIME" in the last 168 hours. Cardiac Enzymes: No results for input(s): "CKTOTAL", "CKMB", "CKMBINDEX", "TROPONINI" in the last 168 hours. BNP (last 3 results) No results for input(s): "PROBNP" in the last 8760 hours. HbA1C: No results for input(s): "HGBA1C" in the last 72 hours. CBG: No results for input(s): "GLUCAP" in the last 168 hours. Lipid Profile: No results for input(s): "CHOL", "HDL", "LDLCALC", "TRIG", "CHOLHDL", "LDLDIRECT" in the last 72 hours. Thyroid Function Tests: No results for input(s): "TSH", "T4TOTAL", "FREET4", "T3FREE", "THYROIDAB" in the last 72 hours. Anemia Panel: No results for input(s): "VITAMINB12", "FOLATE", "FERRITIN", "TIBC", "IRON", "RETICCTPCT" in the last 72 hours. Urine analysis:    Component Value Date/Time   COLORURINE YELLOW 11/10/2023 0305   APPEARANCEUR CLEAR 11/10/2023 0305   LABSPEC 1.017 11/10/2023 0305   PHURINE 7.0 11/10/2023 0305   GLUCOSEU >=500 (A) 11/10/2023 0305   HGBUR NEGATIVE 11/10/2023 0305   BILIRUBINUR NEGATIVE 11/10/2023 0305   BILIRUBINUR neg 09/21/2013 1236   KETONESUR 5 (A) 11/10/2023 0305   PROTEINUR NEGATIVE 11/10/2023 0305   UROBILINOGEN negative 09/21/2013 1236   NITRITE NEGATIVE 11/10/2023 0305   LEUKOCYTESUR NEGATIVE 11/10/2023 0305    Radiological Exams on Admission: CT Cervical Spine Wo Contrast Result Date: 11/10/2023 CLINICAL DATA:  Fall, head injury, scalp hematoma EXAM: CT CERVICAL SPINE WITHOUT CONTRAST TECHNIQUE: Multidetector CT imaging of the cervical spine was performed without intravenous contrast. Multiplanar CT image reconstructions were also generated. RADIATION DOSE REDUCTION: This exam was performed according to the departmental dose-optimization program which includes automated exposure control, adjustment of the mA and/or kV according to patient size and/or use of iterative reconstruction technique. COMPARISON:  None Available.  FINDINGS: Alignment: Normal. Skull base and vertebrae: Grade cervical alignment is normal. The atlantodental interval is not widened. No acute fracture of the cervical spine. Vertebral body height is preserved. Soft tissues and spinal canal: No prevertebral fluid or swelling. No visible canal hematoma. Disc levels: Disc space narrowing and endplate remodeling is seen throughout the cervical spine most severe at C5-C7 in keeping with changes of moderate to severe degenerative disc disease. No significant canal  stenosis. Multilevel uncovertebral and facet arthrosis results in multilevel moderate to severe neuroforaminal narrowing, most severe on the right at C3-4 and C5-6 and on the left at C5-6 and C6-7 Upper chest: Negative. Other: None IMPRESSION: 1. No acute fracture or listhesis of the cervical spine. 2. Multilevel degenerative disc and degenerative joint disease resulting in multilevel moderate to severe neuroforaminal narrowing, most severe on the right at C3-4 and C5-6 and on the left at C5-6 and C6-7. Electronically Signed   By: Helyn Numbers M.D.   On: 11/10/2023 02:14   CT Maxillofacial Wo Contrast Result Date: 11/10/2023 CLINICAL DATA:  Blunt facial trauma EXAM: CT MAXILLOFACIAL WITHOUT CONTRAST TECHNIQUE: Multidetector CT imaging of the maxillofacial structures was performed. Multiplanar CT image reconstructions were also generated. RADIATION DOSE REDUCTION: This exam was performed according to the departmental dose-optimization program which includes automated exposure control, adjustment of the mA and/or kV according to patient size and/or use of iterative reconstruction technique. COMPARISON:  None Available. FINDINGS: Osseous: No fracture or mandibular dislocation. No destructive process. Orbits: Moderate left preseptal soft tissue swelling. Ocular globes are intact. Ocular lenses have been removed. Retro-orbital fat is preserved. No retro-orbital mass lesion hematoma identified. Extraocular  musculature and optic nerves are unremarkable. Sinuses: Clear. Soft tissues: Moderate soft tissue swelling noted over the nasal bridge. Moderate scalp hematoma noted superficial to the left frontal bone. Limited intracranial: Mild subarachnoid hemorrhage is partially visualized within the left parafalcine frontal lobe, better seen on concurrently performed CT examination of the head. IMPRESSION: 1. No acute facial bone fracture. 2. Moderate left preseptal soft tissue swelling. 3. Moderate soft tissue swelling over the nasal bridge. 4. Moderate scalp hematoma superficial to the left frontal bone. 5. Mild subarachnoid hemorrhage partially visualized within the left parafalcine frontal lobe, better seen on concurrently performed CT examination of the head. Electronically Signed   By: Helyn Numbers M.D.   On: 11/10/2023 02:01   CT Head Wo Contrast Result Date: 11/10/2023 CLINICAL DATA:  Fall EXAM: CT HEAD WITHOUT CONTRAST TECHNIQUE: Contiguous axial images were obtained from the base of the skull through the vertex without intravenous contrast. RADIATION DOSE REDUCTION: This exam was performed according to the departmental dose-optimization program which includes automated exposure control, adjustment of the mA and/or kV according to patient size and/or use of iterative reconstruction technique. COMPARISON:  None Available. FINDINGS: Brain: There is a small amount of traumatic subarachnoid hemorrhage over both convexities, left-greater-than-right. Hypoattenuation of the white matter. Mild volume loss Vascular: There is atherosclerotic calcification of both internal carotid arteries at the skull base. Skull: Large left frontal scalp hematoma.  No skull fracture Sinuses/Orbits: No acute finding. Other: None. IMPRESSION: 1. Small amount of traumatic subarachnoid hemorrhage over both convexities, left-greater-than-right. 2. Large left frontal scalp hematoma without skull fracture. Critical Value/emergent results were  called by telephone at the time of interpretation on 11/10/2023 at 1:47 am to provider [facility] , who verbally acknowledged these results. Electronically Signed   By: Deatra Robinson M.D.   On: 11/10/2023 01:47   DG Knee Complete 4 Views Right Result Date: 11/10/2023 CLINICAL DATA:  Tripped and fell at home. Large hematoma to left forehead. Pain to right knee. EXAM: RIGHT KNEE - COMPLETE 4+ VIEW COMPARISON:  None Available. FINDINGS: No evidence of fracture, dislocation, or joint effusion. No evidence of arthropathy or other focal bone abnormality. Soft tissues are unremarkable. IMPRESSION: Negative. Electronically Signed   By: Minerva Fester M.D.   On: 11/10/2023 01:45   DG Chest  2 View Result Date: 11/10/2023 CLINICAL DATA:  Tripped and fell at home. Large hematoma to left forehead. Pain to right knee. EXAM: CHEST - 2 VIEW COMPARISON:  05/25/2021 FINDINGS: Stable cardiomediastinal silhouette. Aortic atherosclerotic calcification. Low lung volumes. No focal consolidation, pleural effusion, or pneumothorax. No displaced rib fractures. IMPRESSION: No acute cardiopulmonary disease. Electronically Signed   By: Minerva Fester M.D.   On: 11/10/2023 01:44    EKG: Independently reviewed. See above  Assessment/Plan  Small B/l SAH s/p fall with head injury while on asa and plavix -admit to progressive care - hold asa,plavix  -neuro checks per protocol  - repeat CTH  in  8 hours -Keppra 500mg  bid x7d -f/u with further neurosurgery recs in am   CVA  with residual memory deficits/MCI -holding asa/ Plavix   Leukocytosis  -? Stress response  - no signs of infection in initial evaluation  - will repeat labs and monitor  -will check inflammatory markers  Asthma -no acute exacerbation  -resume controller medicatios  -prn nebs   DMII -stable hold metformin for now  -monitor fs , place on iss   Depression -no active issues    Gout -no active flare   -patient denies  flare  Obesity Complicates overall prognosis and care Lifestyle modification and exercise has been discussed with patient in detail     HTN -resume carvedilol, arb with parameters once med rec completed   Hypothyroidism -resume thyroid supplement    Gait disturbance -PT/OT to see   DVT prophylaxis: scd Code Status: full/ as discussed per patient wishes in event of cardiac arrest  Family Communication: none at bedside Disposition Plan: patient  expected to be admitted greater than 2 midnights  Consults called: Neurosurgery  Tomlinson,Cara Admission status: progressive care    Lurline Del MD [facility]   If 7PM-7AM, please contact night-coverage [email or web address] Password [facility]  11/10/2023, 4:28 AM

## 2023-11-10 NOTE — Progress Notes (Signed)
Pt arrived on unit. VSS, assessment completed. Suicide risk assessment showed a moderate risk, suicide precautions initiated. MD Gherghe made aware.

## 2023-11-10 NOTE — Evaluation (Addendum)
Physical Therapy Evaluation Patient Details Name: Caitlin Harrington MRN: [id number] DOB: 1946-04-17 Today's Date: 11/10/2023  History of Present Illness  Patient is a 77 year old female presenting after mechanical fall at home with head injury. Found to have small bilateral frontal SAH/contusions. History of falls, memory defict, obesity, HTN, gait disturbance.  Clinical Impression  Patient is agreeable to PT session. She reports history of multiple falls. She lives with her son and is ambulatory without assistive device.  Today the patient required assistance for bed mobility, transfers, and ambulation. Mild unsteadiness with ambulation with frequent cues for safety as patient bumping into objects on the left. Safety cues also required for proper use of rolling walker. Patient reports mild dizziness with activity with vitals stable throughout session. The patient does not appear to be at her baseline level of functional independence. Recommend PT follow up to maximize independence and decrease caregiver burden. Consider rehabilitation > 3 hours/day after this hospital stay.       If plan is discharge home, recommend the following: A lot of help with walking and/or transfers;A lot of help with bathing/dressing/bathroom;Assistance with cooking/housework;Help with stairs or ramp for entrance;Supervision due to cognitive status;Direct supervision/assist for medications management   Can travel by private vehicle   Yes    Equipment Recommendations Rolling walker (2 wheels)  Recommendations for Other Services       Functional Status Assessment Patient has had a recent decline in their functional status and demonstrates the ability to make significant improvements in function in a reasonable and predictable amount of time.     Precautions / Restrictions Precautions Precautions: Fall Restrictions Weight Bearing Restrictions Per Provider Order: No      Mobility  Bed Mobility Overal bed  mobility: Needs Assistance Bed Mobility: Supine to Sit, Sit to Supine     Supine to sit: Min assist Sit to supine: Min assist   General bed mobility comments: trunk support provided. cues for sequencing    Transfers Overall transfer level: Needs assistance Equipment used: Rolling walker (2 wheels) Transfers: Sit to/from Stand Sit to Stand: Min assist           General transfer comment: steadying assistance provided with cues for safety and proper use of rolling walker    Ambulation/Gait Ambulation/Gait assistance: Min assist, Contact guard assist Gait Distance (Feet): 75 Feet Assistive device: Rolling walker (2 wheels) Gait Pattern/deviations: Decreased stride length, Drifts right/left Gait velocity: decreased     General Gait Details: patient required navigation cues in hallway and frequent cues for proper rolling walker use. occasional unsteadiness, Min A required. patient frequently bumping into objects on the left side  Stairs            Wheelchair Mobility     Tilt Bed    Modified Rankin (Stroke Patients Only)       Balance Overall balance assessment: Needs assistance Sitting-balance support: Feet supported Sitting balance-Leahy Scale: Fair     Standing balance support: Bilateral upper extremity supported Standing balance-Leahy Scale: Poor Standing balance comment: external support required                             Pertinent Vitals/Pain Pain Assessment Pain Assessment: Faces Faces Pain Scale: Hurts little more Pain Location: headahce, left side of body Pain Descriptors / Indicators: Discomfort, Guarding Pain Intervention(s): Limited activity within patient's tolerance, Monitored during session, Repositioned    Home Living Family/patient expects to be discharged to:: Private  residence Living Arrangements: Children (recently moved in with son) Available Help at Discharge: Family;Available PRN/intermittently Type of Home:  House           Home Equipment: None Additional Comments: son reports patient recently moved out from living with her ex-significant other and now lives with the son. he was unable to state how many falls she may have had recently as she has just been with him recently    Prior Function Prior Level of Function : History of Falls (last six months);Independent/Modified Independent             Mobility Comments: occasional assistance required to sit up in bed. patient reports she is ambulatory without device at baseline. she tripped and fell over a rug per son report. ADLs Comments: family assists wtih medication management     Extremity/Trunk Assessment   Upper Extremity Assessment Upper Extremity Assessment: Defer to OT evaluation    Lower Extremity Assessment Lower Extremity Assessment: LLE deficits/detail LLE Deficits / Details: knee extension 4/5, dorsiflexion 4+/5 LLE Sensation: WNL       Communication   Communication Communication: Difficulty following commands/understanding Following commands: Follows one step commands with increased time Cueing Techniques: Verbal cues;Tactile cues  Cognition Arousal: Alert Behavior During Therapy: WFL for tasks assessed/performed, Flat affect Overall Cognitive Status: History of cognitive impairments - at baseline                                 General Comments: oriented to person, situation. patient is able to follow single step commands with increased time and multi modal cues. decreased awareness of recent events and does not remember falling at home.        General Comments General comments (skin integrity, edema, etc.): vitals stable throughout session. sons at the bedside at end of session    Exercises     Assessment/Plan    PT Assessment Patient needs continued PT services  PT Problem List Decreased strength;Decreased range of motion;Decreased activity tolerance;Decreased balance;Decreased  mobility;Decreased safety awareness;Decreased knowledge of use of DME;Decreased cognition       PT Treatment Interventions DME instruction;Gait training;Stair training;Functional mobility training;Therapeutic activities;Therapeutic exercise;Balance training;Neuromuscular re-education;Cognitive remediation;Patient/family education    PT Goals (Current goals can be found in the Care Plan section)  Acute Rehab PT Goals Patient Stated Goal: to return home PT Goal Formulation: With patient Time For Goal Achievement: 11/24/23 Potential to Achieve Goals: Good    Frequency Min 1X/week     Co-evaluation PT/OT/SLP Co-Evaluation/Treatment: Yes Reason for Co-Treatment: Complexity of the patient's impairments (multi-system involvement) PT goals addressed during session: Mobility/safety with mobility         AM-PAC PT "6 Clicks" Mobility  Outcome Measure Help needed turning from your back to your side while in a flat bed without using bedrails?: A Little Help needed moving from lying on your back to sitting on the side of a flat bed without using bedrails?: A Little Help needed moving to and from a bed to a chair (including a wheelchair)?: A Little Help needed standing up from a chair using your arms (e.g., wheelchair or bedside chair)?: A Little Help needed to walk in hospital room?: A Little Help needed climbing 3-5 steps with a railing? : A Lot 6 Click Score: 17    End of Session Equipment Utilized During Treatment: Gait belt Activity Tolerance: Patient tolerated treatment well Patient left: in bed;with family/visitor present (on stretcher) Nurse Communication:  Mobility status PT Visit Diagnosis: Unsteadiness on feet (R26.81);Muscle weakness (generalized) (M62.81)    Time: 3329-5188 PT Time Calculation (min) (ACUTE ONLY): 14 min   Charges:   PT Evaluation $PT Eval Low Complexity: 1 Low   PT General Charges $$ ACUTE PT VISIT: 1 Visit         Lyberti Bernard, PT,  MPT   Ina Homes 11/10/2023, 11:20 AM

## 2023-11-10 NOTE — ED Notes (Signed)
Pt is AOX4, able to follow commands, no slurred speech, denies headache or nausea, but very drowsy, and does not want to eat, Elvera Lennox, MD notified.

## 2023-11-10 NOTE — Progress Notes (Signed)
PROGRESS NOTE  Caitlin Harrington YNW:[id number] DOB: 1946-05-10 DOA: 11/10/2023 PCP: Ileana Ladd, MD (Inactive)   LOS: 0 days   Brief Narrative / Interim history: 77 year old female with DM2, prior CVA on aspirin and Plavix, mild memory deficits, HTN, hypothyroidism, recurrent falls comes in to the hospital with another fall with head injury.  She does not recall much about the fall, but has had 3 prior falls in the last 6 months.  On admission she was found to have mild subarachnoid hemorrhage within the left parafalcine frontal lobe.  Neurosurgery was consulted and she was admitted to the hospital  Subjective / 24h Interval events: She is doing well on my evaluation, keeps her eyes closed through the interview.  She complains of mild headache and denies any new deficits.  She knows where she is and why she is here  Assesement and Plan: Principal problem Bilateral traumatic subarachnoid hemorrhage-seen on the CT scan, hold her aspirin and Plavix for now and patient was started on Keppra 500 mg twice daily for 7 days per neurosurgery recommendation -Appreciate neurosurgery follow-up -Repeat CT scan in about 6 hours  Active problems Prior CVA with residual memory deficits-hold aspirin and Plavix for now.  Leukocytosis-possibly reactive.  Will monitor.  Afebrile  Essential hypertension-normotensive this morning.  Med rec not done  Depression-resume home medications once med rec done  Hypothyroidism-appears to be on Synthroid 25 mcg.  Med rec not done.  TSH pending  DM2-has been placed on sliding scale, hold oral agents  Obesity, class II-BMI 39, she would benefit from weight loss   Scheduled Meds:  allopurinol  300 mg Oral Daily   insulin aspart  0-9 Units Subcutaneous TID WC   Continuous Infusions:  levETIRAcetam Stopped (11/10/23 0345)   PRN Meds:.acetaminophen **OR** acetaminophen, albuterol, morphine injection, ondansetron **OR** ondansetron (ZOFRAN) IV  Current  Outpatient Medications  Medication Instructions   albuterol (VENTOLIN HFA) 108 (90 Base) MCG/ACT inhaler 2 puffs, 2 times daily PRN   allopurinol (ZYLOPRIM) 300 mg, Oral, Daily   amLODipine (NORVASC) 5 mg, Oral, Daily   aspirin EC 81 mg, Oral, Daily, Swallow whole.   carvedilol (COREG) 6.25 mg, Oral, 2 times daily with meals   clopidogrel (PLAVIX) 75 mg, Oral, Daily   Euthyrox 25 mcg, Oral, Daily   hydrOXYzine (ATARAX) 25 mg, Oral, Daily PRN   insulin glargine (LANTUS) 15 Units, Daily   losartan (COZAAR) 25 mg, Oral, Daily   metFORMIN (GLUCOPHAGE) 500 mg, Oral, Daily   PARoxetine (PAXIL) 40 mg, Oral, Every morning   rosuvastatin (CRESTOR) 20 mg, Oral, Daily   sertraline (ZOLOFT) 50 mg, Oral, Daily   sitaGLIPtin (JANUVIA) 100 mg, Oral, Daily   terazosin (HYTRIN) 5 mg, Oral, Daily   UNABLE TO FIND 1 tablet, Daily, Med Name: centrum gummies    Diet Orders (From admission, onward)     Start     Ordered   11/10/23 0528  Diet heart healthy/carb modified Room service appropriate? Yes; Fluid consistency: Thin  Diet effective now       Question Answer Comment  Diet-HS Snack? Nothing   Room service appropriate? Yes   Fluid consistency: Thin      11/10/23 0529            DVT prophylaxis: SCDs Start: 11/10/23 0525   Lab Results  Component Value Date   PLT 265 11/10/2023      Code Status: Full Code  Family Communication: No family at bedside  Status is: Inpatient Remains inpatient appropriate  because: Severity of illness   Level of care: Progressive  Consultants:  Neurosurgery  Objective: Vitals:   11/10/23 0100 11/10/23 0318 11/10/23 0436 11/10/23 0529  BP: (!) 132/59 (!) 148/73 124/73   Pulse: 71 73 72   Resp: 16 16 16    Temp: 97.7 F (36.5 C)   98.1 F (36.7 C)  TempSrc: Oral   Oral  SpO2: 99% 97% 94%     Intake/Output Summary (Last 24 hours) at 11/10/2023 0647 Last data filed at 11/10/2023 0345 Gross per 24 hour  Intake 100 ml  Output --  Net 100 ml    Wt Readings from Last 3 Encounters:  03/12/23 89.8 kg  08/25/21 84.2 kg  05/25/21 90.7 kg    Examination:  Constitutional: NAD ENMT: Mucous membranes are moist.  Neck: normal, supple Respiratory: clear to auscultation bilaterally, no wheezing, no crackles. Normal respiratory effort. Cardiovascular: Regular rate and rhythm, no murmurs / rubs / gallops. Trace LE edema.  Abdomen: non distended, no tenderness. Bowel sounds positive.  Musculoskeletal: no clubbing / cyanosis.  Skin: no rashes Neurologic: non focal, equal strength   Data Reviewed: I have independently reviewed following labs and imaging studies  CBC Recent Labs  Lab 11/10/23 0200  WBC 16.1*  HGB 12.6  HCT 37.5  PLT 265  MCV 84.7  MCH 28.4  MCHC 33.6  RDW 14.1  LYMPHSABS 2.4  MONOABS 0.9  EOSABS 0.5  BASOSABS 0.1    Recent Labs  Lab 11/10/23 0200  NA 137  K 4.1  CL 101  CO2 22  GLUCOSE 144*  BUN 16  CREATININE 0.85  CALCIUM 9.2  AST 24  ALT 12  ALKPHOS 96  BILITOT 0.5  ALBUMIN 3.3*    ------------------------------------------------------------------------------------------------------------------ No results for input(s): "CHOL", "HDL", "LDLCALC", "TRIG", "CHOLHDL", "LDLDIRECT" in the last 72 hours.  Lab Results  Component Value Date   HGBA1C 10.7 (H) 05/03/2021   ------------------------------------------------------------------------------------------------------------------ No results for input(s): "TSH", "T4TOTAL", "T3FREE", "THYROIDAB" in the last 72 hours.  Invalid input(s): "FREET3"  Cardiac Enzymes No results for input(s): "CKMB", "TROPONINI", "MYOGLOBIN" in the last 168 hours.  Invalid input(s): "CK" ------------------------------------------------------------------------------------------------------------------    Component Value Date/Time   BNP 47.3 12/31/2019 0413    CBG: No results for input(s): "GLUCAP" in the last 168 hours.  No results found for this or  any previous visit (from the past 240 hours).   Radiology Studies: CT Cervical Spine Wo Contrast Result Date: 11/10/2023 CLINICAL DATA:  Fall, head injury, scalp hematoma EXAM: CT CERVICAL SPINE WITHOUT CONTRAST TECHNIQUE: Multidetector CT imaging of the cervical spine was performed without intravenous contrast. Multiplanar CT image reconstructions were also generated. RADIATION DOSE REDUCTION: This exam was performed according to the departmental dose-optimization program which includes automated exposure control, adjustment of the mA and/or kV according to patient size and/or use of iterative reconstruction technique. COMPARISON:  None Available. FINDINGS: Alignment: Normal. Skull base and vertebrae: Grade cervical alignment is normal. The atlantodental interval is not widened. No acute fracture of the cervical spine. Vertebral body height is preserved. Soft tissues and spinal canal: No prevertebral fluid or swelling. No visible canal hematoma. Disc levels: Disc space narrowing and endplate remodeling is seen throughout the cervical spine most severe at C5-C7 in keeping with changes of moderate to severe degenerative disc disease. No significant canal stenosis. Multilevel uncovertebral and facet arthrosis results in multilevel moderate to severe neuroforaminal narrowing, most severe on the right at C3-4 and C5-6 and on the left at C5-6 and C6-7  Upper chest: Negative. Other: None IMPRESSION: 1. No acute fracture or listhesis of the cervical spine. 2. Multilevel degenerative disc and degenerative joint disease resulting in multilevel moderate to severe neuroforaminal narrowing, most severe on the right at C3-4 and C5-6 and on the left at C5-6 and C6-7. Electronically Signed   By: Helyn Numbers M.D.   On: 11/10/2023 02:14   CT Maxillofacial Wo Contrast Result Date: 11/10/2023 CLINICAL DATA:  Blunt facial trauma EXAM: CT MAXILLOFACIAL WITHOUT CONTRAST TECHNIQUE: Multidetector CT imaging of the maxillofacial  structures was performed. Multiplanar CT image reconstructions were also generated. RADIATION DOSE REDUCTION: This exam was performed according to the departmental dose-optimization program which includes automated exposure control, adjustment of the mA and/or kV according to patient size and/or use of iterative reconstruction technique. COMPARISON:  None Available. FINDINGS: Osseous: No fracture or mandibular dislocation. No destructive process. Orbits: Moderate left preseptal soft tissue swelling. Ocular globes are intact. Ocular lenses have been removed. Retro-orbital fat is preserved. No retro-orbital mass lesion hematoma identified. Extraocular musculature and optic nerves are unremarkable. Sinuses: Clear. Soft tissues: Moderate soft tissue swelling noted over the nasal bridge. Moderate scalp hematoma noted superficial to the left frontal bone. Limited intracranial: Mild subarachnoid hemorrhage is partially visualized within the left parafalcine frontal lobe, better seen on concurrently performed CT examination of the head. IMPRESSION: 1. No acute facial bone fracture. 2. Moderate left preseptal soft tissue swelling. 3. Moderate soft tissue swelling over the nasal bridge. 4. Moderate scalp hematoma superficial to the left frontal bone. 5. Mild subarachnoid hemorrhage partially visualized within the left parafalcine frontal lobe, better seen on concurrently performed CT examination of the head. Electronically Signed   By: Helyn Numbers M.D.   On: 11/10/2023 02:01   CT Head Wo Contrast Result Date: 11/10/2023 CLINICAL DATA:  Fall EXAM: CT HEAD WITHOUT CONTRAST TECHNIQUE: Contiguous axial images were obtained from the base of the skull through the vertex without intravenous contrast. RADIATION DOSE REDUCTION: This exam was performed according to the departmental dose-optimization program which includes automated exposure control, adjustment of the mA and/or kV according to patient size and/or use of iterative  reconstruction technique. COMPARISON:  None Available. FINDINGS: Brain: There is a small amount of traumatic subarachnoid hemorrhage over both convexities, left-greater-than-right. Hypoattenuation of the white matter. Mild volume loss Vascular: There is atherosclerotic calcification of both internal carotid arteries at the skull base. Skull: Large left frontal scalp hematoma.  No skull fracture Sinuses/Orbits: No acute finding. Other: None. IMPRESSION: 1. Small amount of traumatic subarachnoid hemorrhage over both convexities, left-greater-than-right. 2. Large left frontal scalp hematoma without skull fracture. Critical Value/emergent results were called by telephone at the time of interpretation on 11/10/2023 at 1:47 am to provider [facility] , who verbally acknowledged these results. Electronically Signed   By: Deatra Robinson M.D.   On: 11/10/2023 01:47   DG Knee Complete 4 Views Right Result Date: 11/10/2023 CLINICAL DATA:  Tripped and fell at home. Large hematoma to left forehead. Pain to right knee. EXAM: RIGHT KNEE - COMPLETE 4+ VIEW COMPARISON:  None Available. FINDINGS: No evidence of fracture, dislocation, or joint effusion. No evidence of arthropathy or other focal bone abnormality. Soft tissues are unremarkable. IMPRESSION: Negative. Electronically Signed   By: Minerva Fester M.D.   On: 11/10/2023 01:45   DG Chest 2 View Result Date: 11/10/2023 CLINICAL DATA:  Tripped and fell at home. Large hematoma to left forehead. Pain to right knee. EXAM: CHEST - 2 VIEW COMPARISON:  05/25/2021 FINDINGS:  Stable cardiomediastinal silhouette. Aortic atherosclerotic calcification. Low lung volumes. No focal consolidation, pleural effusion, or pneumothorax. No displaced rib fractures. IMPRESSION: No acute cardiopulmonary disease. Electronically Signed   By: Minerva Fester M.D.   On: 11/10/2023 01:44     Pamella Pert, MD, PhD [facility]  Between 7 am - 7 pm I am available, please contact me via  Amion (for emergencies) or Securechat (non urgent messages)  Between 7 pm - 7 am I am not available, please contact night coverage MD/APP via Amion

## 2023-11-10 NOTE — ED Triage Notes (Signed)
Patient trip and fall at home. Husband called ems. Patient with a large hematoma to left forehead. GCS 14.

## 2023-11-11 DIAGNOSIS — I609 Nontraumatic subarachnoid hemorrhage, unspecified: Secondary | ICD-10-CM | POA: Diagnosis not present

## 2023-11-11 LAB — COMPREHENSIVE METABOLIC PANEL
ALT: 13 U/L (ref 0–44)
AST: 17 U/L (ref 15–41)
Albumin: 3.1 g/dL — ABNORMAL LOW (ref 3.5–5.0)
Alkaline Phosphatase: 87 U/L (ref 38–126)
Anion gap: 6 (ref 5–15)
BUN: 19 mg/dL (ref 8–23)
CO2: 23 mmol/L (ref 22–32)
Calcium: 8.8 mg/dL — ABNORMAL LOW (ref 8.9–10.3)
Chloride: 108 mmol/L (ref 98–111)
Creatinine, Ser: 1.19 mg/dL — ABNORMAL HIGH (ref 0.44–1.00)
GFR, Estimated: 48 mL/min — ABNORMAL LOW (ref >60)
Glucose, Bld: 111 mg/dL — ABNORMAL HIGH (ref 70–99)
Potassium: 3.8 mmol/L (ref 3.5–5.1)
Sodium: 137 mmol/L (ref 135–145)
Total Bilirubin: 0.5 mg/dL (ref <1.2)
Total Protein: 6.5 g/dL (ref 6.5–8.1)

## 2023-11-11 LAB — CBC
HCT: 36.2 % (ref 36.0–46.0)
Hemoglobin: 11.9 g/dL — ABNORMAL LOW (ref 12.0–15.0)
MCH: 28 pg (ref 26.0–34.0)
MCHC: 32.9 g/dL (ref 30.0–36.0)
MCV: 85.2 fL (ref 80.0–100.0)
Platelets: 242 10*3/uL (ref 150–400)
RBC: 4.25 MIL/uL (ref 3.87–5.11)
RDW: 14.5 % (ref 11.5–15.5)
WBC: 10.1 10*3/uL (ref 4.0–10.5)
nRBC: 0 % (ref 0.0–0.2)

## 2023-11-11 LAB — GLUCOSE, CAPILLARY
Glucose-Capillary: 107 mg/dL — ABNORMAL HIGH (ref 70–99)
Glucose-Capillary: 115 mg/dL — ABNORMAL HIGH (ref 70–99)
Glucose-Capillary: 128 mg/dL — ABNORMAL HIGH (ref 70–99)
Glucose-Capillary: 188 mg/dL — ABNORMAL HIGH (ref 70–99)

## 2023-11-11 LAB — MAGNESIUM: Magnesium: 2 mg/dL (ref 1.7–2.4)

## 2023-11-11 MED ORDER — ESCITALOPRAM OXALATE 10 MG PO TABS
20.0000 mg | ORAL_TABLET | Freq: Every day | ORAL | Status: DC
  Administered 2023-11-12: 20 mg via ORAL
  Filled 2023-11-11: qty 2

## 2023-11-11 MED ORDER — ACETAMINOPHEN 650 MG RE SUPP: 650.0000 mg | Freq: Four times a day (QID) | RECTAL | Status: DC | PRN

## 2023-11-11 MED ORDER — ACETAMINOPHEN 325 MG PO TABS: 650.0000 mg | ORAL_TABLET | Freq: Four times a day (QID) | ORAL | Status: DC | PRN

## 2023-11-11 NOTE — Plan of Care (Signed)

## 2023-11-11 NOTE — Progress Notes (Addendum)
Inpatient Rehabilitation Admissions Coordinator   I met at bedside briefly with patient with her sitter present. Patient maintains her eyes closed during our discussion. I contacted her Son, [name], by phone. Prior to admit patient had moved in with him about 1 week ago. Prior to that she was living with her ex boyfriend for about 1 1/2 years. Sedentary and felt that she needed more care, came to stay with son 1 week ago. She also has her own home, but does not have 24/7 assistance available. Son would like to discuss possible SNF. Patient not a candidate for Cir for she does not have 24/7 assist that she will need. I will alert Acute team and TOC. We will sign off.  Ottie Glazier, RN, MSN Rehab Admissions Coordinator [phone] 11/11/2023 11:28 AM

## 2023-11-11 NOTE — TOC Initial Note (Addendum)
Transition of Care [facility]) - Initial/Assessment Note    Patient Details  Name: Caitlin Harrington MRN: [id number] Date of Birth: 1946-11-07  Transition [facility] CM/SW Contact:    Kermit Balo, RN Phone Number: 11/11/2023, 10:51 AM  Clinical Narrative:                  CM met with the patient and she has trouble staying awake during visit. Sitter at the bedside.  She says she lives alone but stays between her ex boyfriend and her sons home.  She states she doesn't have DME at home.  She says she was driving but license expired. She says her son or boyfriend get her in-between homes. Ex boyfriend manages her medications at home.  She says she doesn't have 24/7 support after the hospital. CIR is to evaluate.  TOC following.  1542: Son is not sure if he and spouse can provide 24 hour care. They will update CM tomorrow. If not they want SNF. CIR also updated.  Expected Discharge Plan: IP Rehab Facility Barriers to Discharge: Continued Medical Work up   Patient Goals and CMS Choice   CMS Medicare.gov Compare Post Acute Care list provided to:: Patient Choice offered to / list presented to : Patient      Expected Discharge Plan and Services   Discharge Planning Services: CM Consult Post Acute Care Choice: IP Rehab Living arrangements for the past 2 months: Single Family Home                                      Prior Living Arrangements/Services Living arrangements for the past 2 months: Single Family Home Lives with:: Self Patient language and need for interpreter reviewed:: Yes Do you feel safe going back to the place where you live?: Yes        Care giver support system in place?: No (comment)   Criminal Activity/Legal Involvement Pertinent to Current Situation/Hospitalization: No - Comment as needed  Activities of Daily Living   ADL Screening (condition at time of admission) Independently performs ADLs?: Yes (appropriate for developmental age) Is the  patient deaf or have difficulty hearing?: No Does the patient have difficulty seeing, even when wearing glasses/contacts?: No Does the patient have difficulty concentrating, remembering, or making decisions?: Yes  Permission Sought/Granted                  Emotional Assessment Appearance:: Appears stated age Attitude/Demeanor/Rapport: Lethargic Affect (typically observed): Flat Orientation: : Oriented to Self, Oriented to Place, Oriented to Situation   Psych Involvement: Yes (comment)  Admission diagnosis:  Subarachnoid hemorrhage (HCC) [I60.9] SAH (subarachnoid hemorrhage) (HCC) [I60.9] Patient Active Problem List   Diagnosis Date Noted   SAH (subarachnoid hemorrhage) (HCC) 11/10/2023   Subarachnoid hemorrhage (HCC) 11/10/2023   Chronic pain 02/02/2022   Exposure to intestinal infectious disease 11/03/2021   Hardening of the aorta (main artery of the heart) (HCC) 11/03/2021   Hyperglycemia due to type 2 diabetes mellitus (HCC) 11/03/2021   Memory impairment 11/03/2021   Relationship dysfunction 11/03/2021   Chronic kidney disease, stage 3a (HCC) 05/26/2021   Type 2 diabetes mellitus with other specified complication (HCC) 05/26/2021   Unsteady gait    CVA (cerebral vascular accident) (HCC) 05/04/2021   Acute CVA (cerebrovascular accident) (HCC) 05/04/2021   Pain in joint involving ankle and foot 11/06/2020   Acute renal insufficiency 11/06/2020   Allergic  rhinitis 11/06/2020   Arthropathy 11/06/2020   Benign essential hypertension 11/06/2020   Bilateral carpal tunnel syndrome 11/06/2020   Chronic sinusitis 11/06/2020   Diverticulitis of colon 11/06/2020   Eczema 11/06/2020   Headache 11/06/2020   Long term (current) use of insulin (HCC) 11/06/2020   Acute metabolic encephalopathy 11/06/2020   Noncompliance with treatment 11/06/2020   Obstructive sleep apnea syndrome 11/06/2020   Other specified abnormal findings of blood chemistry 11/06/2020   Paresthesias  11/06/2020   Personal history of transient ischemic attack (TIA), and cerebral infarction without residual deficits 11/06/2020   Rash 11/06/2020   Depression 11/06/2020   Skin sensation disturbance 11/06/2020   Subclinical hypothyroidism 11/06/2020   Tick bite 11/06/2020   Tiredness 11/06/2020   Unspecified abnormal finding in specimens from other organs, systems and tissues 11/06/2020   Cerebral thrombosis with cerebral infarction 10/29/2020   Major depressive disorder, recurrent episode, moderate (HCC) 10/27/2020   Allergic reaction 10/26/2020   AKI (acute kidney injury) (HCC) 10/26/2020   Thrush 10/26/2020   Urinary tract infection without hematuria 05/12/2020   Encephalopathy 05/12/2020   Altered mental status    Hyperglycemia    Acute on chronic respiratory failure with hypoxia (HCC) 01/03/2020   COVID-19 12/30/2019   Hyperuricemia    Hypertension 02/18/2011   Prolonged depressive adjustment reaction 02/18/2011   Obesity 02/18/2011   Asthma, mild persistent 02/18/2011   Vitamin D deficiency 02/18/2011   Hyperlipidemia, unspecified 02/18/2011   Diabetes mellitus type 2, controlled, without complications (HCC) 02/18/2011   Insomnia 02/18/2011   Stress at work 02/18/2011   PCP:  Sheliah Hatch, PA-C Pharmacy:   [facility] [phone] - [place], [place] - [address] [place] [address] Phone: [phone] Fax: [phone]  [facility] [address] [place] [address] Phone: [phone] Fax: [phone]     Social Drivers of Health (SDOH) Social History: SDOH Screenings   Food Insecurity: No Food Insecurity (11/10/2023)  Housing: Low Risk  (11/10/2023)  Transportation Needs: No Transportation Needs (11/10/2023)  Utilities: Not At Risk (11/10/2023)  Alcohol Screen: Low Risk  (11/06/2020)  Depression (PHQ2-9): Low Risk  (02/02/2022)  Financial Resource Strain: Low  Risk  (12/26/2021)  Physical Activity: Inactive (11/06/2020)  Social Connections: Unknown (11/06/2020)  Stress: No Stress Concern Present (02/02/2022)  Tobacco Use: Low Risk  (11/10/2023)   SDOH Interventions:     Readmission Risk Interventions    05/27/2021    3:37 PM  Readmission Risk Prevention Plan  Transportation Screening Complete  Home Care Screening Complete  Medication Review (RN CM) Complete

## 2023-11-11 NOTE — Consult Note (Signed)
[facility] Psychiatric Consult Initial  Patient Name: .Caitlin [facility]  MRN: [id number]  DOB: 04/18/1946  Consult Order details:  Orders (From admission, onward)     Start     Ordered   11/11/23 0836  IP CONSULT TO PSYCHIATRY       Ordering Provider: Leatha Gilding, MD  Provider:  (Not yet assigned)  Question Answer Comment  Location [facility]   Reason for Consult? recent SI with plans to overdose on home meds. History of SI at 51 with overdose      11/11/23 0836             Mode of Visit: In person    Psychiatry Consult Evaluation  Service Date: November 11, 2023 LOS:  LOS: 1 day  Chief Complaint follow-up head injury  Primary Psychiatric Diagnoses  MDD, recurrent, severe, without psychotic features GAD  Assessment  Caitlin [facility] is a 77 y.o. female admitted: Medicallyfor 11/10/2023 12:34 AM for subarachnoid hemorrhage. She carries past psychiatric diagnoses of MDD and has a past medical history of Asthma, type II DM, Gout, CVA on aspirin and Plavix, Obesity, Vit D deficiency, HTN, Hypothyroidism.   Her current presentation of worsening depressive symptoms is most consistent with MDD, recurrent, severe, without psychotic features and GAD. She does not meet criteria for IVC based on lack of current or recent SI, lack of safety concerns from son, and social support / safety planning from son and his spouse.  Current outpatient psychotropic medications include escitalopram and historically she has had a poor response to this medication and reports having had a poor response to fluoxetine in remote past.  Discussed behavioral modifications with patient to improve depression including mindfulness (e.g. imaging she is on beach since she loves the [place]), adequate sleep hygiene, therapy (not interested in), and activities to keep her busy (e.g. word puzzles, books, etc). Furthermore, discussed titrating up her escitalopram to a higher dose since she is  tolerating without obvious side effects. On initial examination, patient has depressed mood but is denying SI currently or recently. Plan to discontinue 1:1 monitoring. Safety planned with son and will include resources in discharge info.  Please see plan below for detailed recommendations.   Diagnoses:  Active Hospital problems: Principal Problem:   SAH (subarachnoid hemorrhage) (HCC) Active Problems:   Subarachnoid hemorrhage (HCC)    Plan   ## Psychiatric Medication Recommendations:  -- Increase escitalopram from 10 to 20 mg once daily for MDD -- Continue levothyroxine, consider increasing dose if continued elevated TSH -- Could consider adjuncts including vitamin D, fish oils, etc.   ## Medical Decision Making Capacity: Not specifically addressed in this encounter  ## Further Work-up:  -- Agree with rechecking TSH per primary team note, could consider vitamin D level  -- most recent EKG on 11/10/23 had QtC of 429 -- Pertinent labwork reviewed earlier this admission includes:  TSH elevated at 4.9, A1c 7.0, UA glucosuria ketonuria, CBC hemoglobin 11.9  ## Disposition:-- There are no psychiatric contraindications to discharge at this time.   ## Behavioral / Environmental: -Utilize compassion and acknowledge the patient's experiences while setting clear and realistic expectations for care.  ## Safety and Observation Level:  - Based on my clinical evaluation, I estimate the patient to be at low risk of self harm in the current setting. - At this time, we recommend routine. Discontinuing 1:1 monitoring. This decision is based on my review of the chart including patient's history and current presentation,  interview of the patient, mental status examination, and consideration of suicide risk including evaluating suicidal ideation, plan, intent, suicidal or self-harm behaviors, risk factors, and protective factors. This judgment is based on our ability to directly address suicide risk,  implement suicide prevention strategies, and develop a safety plan while the patient is in the clinical setting. Please contact our team if there is a concern that risk level has changed.  CSSR Risk Category:C-SSRS RISK CATEGORY: Moderate Risk (previous assessment)  Suicide Risk Assessment: Patient has following modifiable risk factors for suicide: under treated depression , which we are addressing by increasing escitalopram and discussing behavioral interventions. Patient has following non-modifiable or demographic risk factors for suicide: separation or divorce, history of suicide attempt, history of self harm behavior, and psychiatric hospitalization Patient has the following protective factors against suicide: Access to outpatient mental health care, Supportive family, and Pets in the home. Safety planned with son.    Thank you for this consult request. Recommendations have been communicated to the primary team.  We will continue to follow at this time.   Meryl Dare, MD       History of Present Illness  Relevant Aspects of [facility]:  77 year old female with DM2, prior CVA on aspirin and Plavix, mild memory deficits, HTN, hypothyroidism, recurrent falls comes in to the hospital with another fall with head injury. She does not recall much about the fall, but has had 3 prior falls in the last 6 months. On admission she was found to have mild subarachnoid hemorrhage within the left parafalcine frontal lobe. Neurosurgery was consulted and she was admitted to the hospital.   Patient Report:  Patient reports that she has had a lot of life stressors recently including becoming separated from her longtime partner who she found out was cheating on her and stealing money from her.  Furthermore she reports that she has had a lot of struggles living with her son and daughter-in-law, although reports that they are helpful with supporting her.  Ultimately she reports that losing her  independence recently has been a major issue for her, particularly since the stroke.  Ideally she would like to move back into her house but this is not an option due to the need for ADL support.  Discussed that she may need a SNF and she reported that she had a bad experience in the past and is really hoping to avoid that by going back to her son's house.  Patient denies SI, HI, AVH.  Reports that her sleep is generally poor since she has been bedbound as she finds it difficult to fall and stay asleep when she is in the bed during the day and at night.  Reports that her appetite is fine and that at times she does eat less and at times she does eat more of the hospital food is also reason she does not use much.  She gave permission to speak with her son.  Patient was open to discussing ways to address her depression including behavioral and medication management.  Patient was unaware that she was already on escitalopram and we discussed increase this medication, which she was agreeable to.  We discussed the benefits and risks of this medication, including discontinuation syndrome.  Furthermore we discussed behavioral interventions including mindfulness.  During the interview she had talked about how she wished she lived in the [place] and naturally was able to provide a lot of specific senses around being there ("warm water", clear water, green  flowing palm trees in wind, etc.).  We discussed how she was essentially already practicing mindfulness that she cultivate this practice by imagining it and take her self to different place that where she is currently in order to recenter.  Furthermore discussed that she can take adjunct medications including fish oils, vitamin D, turmeric (although tumeric can be blood thinning so avoid for now).  Also discussed how she can keep herself busy by doing activities that stimulate her thinking.   Psych ROS:  Depression: Endorses significant depressed mood and loss of  interest.  Denies suicidal ideation.  Endorses low energy, lack of concentration, issues with sleep, lack of appetite at times and overeating at times.  Denies guilt/worthlessness. See HADS below for depression sub-section.  Anxiety: Endorses having some generalized anxiety.  Denies panic attacks, social anxiety, specific phobias, obsessions or compulsions.  Endorses being a Product/process development scientist.  See HADS below for anxiety sub-section.  Mania (lifetime and current): Denies Psychosis: (lifetime and current): Denies  Collateral information:     Son reports that he does not have safety concerns for the patient. Reports that pt told him that she has not had any suicidal thoughts and this conversation with primary was taken out of context from her saying that she had made a suicide attempt in the past. Reports that he did not think she needed to be on "suicide watch."  Reports that there his entire life he has never experienced his mom make any suicidal gestures.  Discussed safety planning including having him only give the patient her daily dose of medication so she does not have access to unsafe amounts of medications for OD.  Furthermore discussed with any concern that they can go to [facility], which has been included in the discharge paperwork.  Son understood and was open to these suggestions.  Review of Systems  Constitutional:  Negative for fever.  Cardiovascular:  Negative for chest pain and palpitations.  Gastrointestinal:  Negative for constipation, diarrhea, nausea and vomiting.  Neurological:  Negative for dizziness, weakness and headaches.     Psychiatric and Social History  Psychiatric History:  Information collected from patient, chart, son  Prev Dx/Sx: MDD Current Psych Provider: None, likely PCP Home Meds (current): Escitalopram 10 mg once daily Previous Med Trials: Fluoxetine which was ineffective after a year Therapy: Denies  Prior Psych Hospitalization:   (consulted in 2021 for capacity / severe depression) Prior Self Harm: SA by OD at 78 y/o.  Prior Violence: Denies  Family Psych History: Substance use disorder in first-degree relatives.  Bipolar in second relative. 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  Reported that she was released from the job due to cuts in funding which affected her retirement funding and she is spiteful of.  Legal Hx: Denies Living Situation: Was living independently in her home.  Reported that she recently moved in with her partner after her stroke.  Then she found out that he was cheating on her and stealing from her.  She now is living with her son due to her need for support with ADLs. Spiritual Hx: Not religious.  "Half and half."  Did not clarify this quote. Access to weapons/lethal means: No  Substance History Alcohol: Denies Tobacco: Denies Illicit drugs: Denies Prescription drug abuse: Denies Rehab hx: Denies  Exam Findings  Physical Exam:  Vital  Signs:  Temp:  [98.1 F (36.7 C)-99.2 F (37.3 C)] 98.5 F (36.9 C) (12/19 1549) Pulse Rate:  [60-69] 69 (12/19 1549) Resp:  [17-19] 18 (12/19 1549) BP: (100-135)/(58-72) 132/72 (12/19 1549) SpO2:  [92 %-95 %] 94 % (12/19 1549) Blood pressure 132/72, pulse 69, temperature 98.5 F (36.9 C), temperature source Oral, resp. rate 18, height 5' (1.524 m), weight 83.1 kg, SpO2 94%. Body mass index is 35.78 kg/m.  Physical Exam Vitals and nursing note reviewed.  Pulmonary:     Effort: Pulmonary effort is normal.  Neurological:     Mental Status: She is alert.     Mental Status Exam: General Appearance: Casual  Orientation:  Full (Time, Place, and Person)  Memory:  Immediate;   Good Recent;   Good Remote;   Good  Concentration:  Concentration: Good  Recall:  Good  Attention  Good  Eye  Contact:  Good  Speech:  Normal Rate  Language:  Good  Volume:  Normal  Mood: depressed  Affect:  Congruent  Thought Process:  Coherent  Thought Content:  Logical  Suicidal Thoughts:  No  Homicidal Thoughts:  No  Judgement:  Fair  Insight:  Fair  Psychomotor Activity:  Decreased  Akathisia:  No  Fund of Knowledge:  Good      Assets:  Communication Skills Desire for Improvement Housing Social Support  Cognition:  WNL  ADL's:  Impaired  AIMS (if indicated):   not indicated     Hospital Anxiety Depression Scale (HADS) 11/11/2023  Anxiety Subscale (HADS-A): 12 1. I feel tense or 'wound up': 2 2. I get a sort of frightened feeling as if something awful is about to happen: 2 3. Worrying thoughts go through my mind: 2 4. I can sit at ease and feel relaxed: 2 5. I get a sort of frightened feeling like 'butterflies' in the stomach: 1 6. I feel restless as if I have to be on the move: 2 7. I get sudden feelings of panic: 1  Depression Subscale (HADS-D): 15 1. I still enjoy the things I used to enjoy: 3 2. I can laugh and see the funny side of things: 2 3. I feel cheerful: 0 4. I feel as if I am slowed down: 1 5. I have lost interest in my appearance: 3 6. I look forward with enjoyment to things: 3 7. I can enjoy a good book or radio or TV program: 3  Each item is scored on a scale from 0 to 3, with higher scores indicating greater levels of anxiety or depression. The total score for each subscale ranges from 0 to 21, with scores of 8-10 indicating mild symptoms, 11-14 indicating moderate symptoms, and 15-21 indicating severe symptoms.   Other History   These have been pulled in through the EMR, reviewed, and updated if appropriate.  Family History:  The patient's family history includes Cancer in her sister; Diabetes in her maternal grandfather; Other in her brother and mother.  Medical History: Past Medical History:  Diagnosis Date   Asthma    COVID-19 12/2019    Depression    Diabetes mellitus without complication (HCC)    type 2   Fatigue    Gout    Hypertension    Insomnia    Numbness    Obesity    Paresthesia    Stroke (HCC) 10/2020   Vitamin D deficiency     Surgical History: Past Surgical History:  Procedure Laterality Date   ABDOMINAL HYSTERECTOMY  BUBBLE STUDY  05/06/2021   Procedure: BUBBLE STUDY;  Surgeon: Vesta Mixer, MD;  Location: [facility] ENDOSCOPY;  Service: Cardiovascular;;   cataracts  2018   COLONOSCOPY WITH PROPOFOL N/A 03/12/2023   Procedure: COLONOSCOPY WITH PROPOFOL;  Surgeon: Jeani Hawking, MD;  Location: [facility] ENDOSCOPY;  Service: Gastroenterology;  Laterality: N/A;   POLYPECTOMY  03/12/2023   Procedure: POLYPECTOMY;  Surgeon: Jeani Hawking, MD;  Location: [facility] ENDOSCOPY;  Service: Gastroenterology;;  x 2 cold snare   TEE WITHOUT CARDIOVERSION N/A 05/06/2021   Procedure: TRANSESOPHAGEAL ECHOCARDIOGRAM (TEE);  Surgeon: Vesta Mixer, MD;  Location[facility] END[facility]COPY;  Service: Cardiovascular;  Laterality: N/A;     Medications:   Current Facility-Administered Medications:    acetaminophen (TYLENOL) tablet 650 mg, 650 mg, Oral, Q6H PRN **OR** acetaminophen (TYLENOL) suppository 650 mg, 650 mg, Rectal, Q6H PRN, Elvera Lennox, Costin M, MD   albuterol (PROVENTIL) (2.5 MG/3ML) 0.083% nebulizer solution 2.5 mg, 2.5 mg, Nebulization, Q2H PRN, Lurline Del, MD   allopurinol (ZYLOPRIM) tablet 300 mg, 300 mg, Oral, Daily, Skip Mayer A, MD, 300 mg at 11/11/23 1016   carvedilol (COREG) tablet 6.25 mg, 6.25 mg, Oral, BID WC, Gherghe, Costin M, MD, 6.25 mg at 11/11/23 1636   [START ON 11/12/2023] escitalopram (LEXAPRO) tablet 20 mg, 20 mg, Oral, Daily, Gilman Buttner, Shenequa Howse, MD   insulin aspart (novoLOG) injection 0-9 Units, 0-9 Units, Subcutaneous, TID WC, Skip Mayer A, MD, 2 Units at 11/11/23 1636   levETIRAcetam (KEPPRA) IVPB 500 mg/100 mL premix, 500 mg, Intravenous, BID, Patrici Ranks [place], PA-C, Stopped at 11/11/23  1031   levothyroxine (SYNTHROID) tablet 50 mcg, 50 mcg, Oral, QAC breakfast, Leatha Gilding, MD, 50 mcg at 11/11/23 0802   morphine (PF) 2 MG/ML injection 1 mg, 1 mg, Intravenous, Q4H PRN, Skip Mayer A, MD   ondansetron (ZOFRAN) tablet 4 mg, 4 mg, Oral, Q6H PRN **OR** ondansetron (ZOFRAN) injection 4 mg, 4 mg, Intravenous, Q6H PRN, Lurline Del, MD   Oral care mouth rinse, 15 mL, Mouth Rinse, PRN, Elvera Lennox, Daylene Katayama, MD   traMADol (ULTRAM) tablet 25 mg, 25 mg, Oral, Q6H PRN, Leatha Gilding, MD, 25 mg at 11/10/23 1724  Allergies: Allergies  Allergen Reactions   Lactose Intolerance (Gi) Nausea And Vomiting   Meloxicam     Other reaction(s): rash   Amlodipine Other (See Comments)    Other reaction(s): headache Other reaction(s): headache   Atorvastatin Hives and Rash    chills Other reaction(s): chills   Doxycycline Rash    Other reaction(s): rash   Ibuprofen Hives    Other reaction(s): hives   Meloxicam Hives and Rash   Statins Hives and Rash    Meryl Dare, MD

## 2023-11-11 NOTE — TOC CAGE-AID Note (Signed)
Transition of Care [facility]) - CAGE-AID Screening  Patient Details  Name: C[facility]lin [facility] MRN: [id number] Date of Birth: 05-Mar-1946  Clinical Narrative:  Patient denies any alcohol or drug use, no need to provide substance abuse resources at this time.  CAGE-AID Screening:   Have You Ever Felt You Ought to Cut Down on Your Drinking or Drug Use?: No Have People Annoyed You By Critizing Your Drinking Or Drug Use?: No Have You Felt Bad Or Guilty About Your Drinking Or Drug Use?: No Have You Ever Had a Drink or Used Drugs First Thing In The Morning to Steady Your Nerves or to Get Rid of a Hangover?: No CAGE-AID Score: 0  Substance Abuse Education Offered: No

## 2023-11-11 NOTE — Plan of Care (Signed)

## 2023-11-11 NOTE — Progress Notes (Signed)
PROGRESS NOTE  Caitlin Harrington AOZ:[id number] DOB: 1946/10/06 DOA: 11/10/2023 PCP: Sheliah Hatch, PA-C   LOS: 1 day   Brief Narrative / Interim history: 77 year old female with DM2, prior CVA on aspirin and Plavix, mild memory deficits, HTN, hypothyroidism, recurrent falls comes in to the hospital with another fall with head injury.  She does not recall much about the fall, but has had 3 prior falls in the last 6 months.  On admission she was found to have mild subarachnoid hemorrhage within the left parafalcine frontal lobe.  Neurosurgery was consulted and she was admitted to the hospital  Subjective / 24h Interval events: More alert today, awake.  Complains of a mild headache.  No new weakness/deficits.  RN reported suicidal ideation take last night.  Assesement and Plan: Principal problem Bilateral traumatic subarachnoid hemorrhage-seen on the CT scan, hold her aspirin and Plavix for now and patient was started on Keppra 500 mg twice daily for 7 days per neurosurgery recommendation -Repeat CT scan shows stability -PT recommends CIR, consult pending  Active problems Suicidal ideation-patient reports that she has been taking recently to take her own life, feels like sometimes "it is not worth living".  She tells me that she tried at one point to commit suicide when she was 16 by swallowing a lot of pills, but tells me that she slept through it and never sought care afterwards.  At this point she denies any suicidal ideation, but recently had a plan about taking a lot of pills.  She denies having guns in the home -Psychiatry consulted, appreciate input  Prior CVA with residual memory deficits-hold aspirin and Plavix for now. Per neurosurgery, they recommend holding for 10 to 14 days and could be resumed afterwards.  Leukocytosis-possibly reactive.  Will monitor.  Afebrile  Essential hypertension-normotensive this morning.  Continue Coreg  Depression-on Cipro,  continue  Hypothyroidism-continue Synthroid.  TSH 4.9, keep the same dose and I would recheck in 3 to 4 weeks  DM2-has been placed on sliding scale, hold oral agents  Obesity, class II-BMI 35.78, she would benefit from weight loss   Scheduled Meds:  allopurinol  300 mg Oral Daily   carvedilol  6.25 mg Oral BID WC   escitalopram  10 mg Oral Daily   insulin aspart  0-9 Units Subcutaneous TID WC   levothyroxine  50 mcg Oral QAC breakfast   Continuous Infusions:  levETIRAcetam 500 mg (11/11/23 1015)   PRN Meds:.acetaminophen **OR** acetaminophen, albuterol, morphine injection, ondansetron **OR** ondansetron (ZOFRAN) IV, mouth rinse, traMADol  Current Outpatient Medications  Medication Instructions   allopurinol (ZYLOPRIM) 150 mg, Oral, Daily   amLODipine (NORVASC) 5 mg, Oral, Daily   aspirin EC 81 mg, Oral, Daily, Swallow whole.   carvedilol (COREG) 6.25 mg, Oral, 2 times daily with meals   clopidogrel (PLAVIX) 75 mg, Oral, Daily   escitalopram (LEXAPRO) 10 mg, Oral, Daily   hydrOXYzine (ATARAX) 25 mg, Oral, Daily at bedtime   Jardiance 10 mg, Oral, Daily   levothyroxine (SYNTHROID) 50 mcg, Oral, Daily before breakfast   losartan (COZAAR) 25 mg, Oral, Daily   metFORMIN (GLUCOPHAGE) 500 mg, Oral, 2 times daily with meals   Multiple Vitamins-Minerals (MULTIVITAMIN WITH MINERALS) tablet 1 tablet, Oral, Daily   rosuvastatin (CRESTOR) 20 mg, Oral, Daily   sitaGLIPtin (JANUVIA) 100 mg, Oral, Daily    Diet Orders (From admission, onward)     Start     Ordered   11/10/23 1128  Diet Heart Room service appropriate? Yes; Fluid  consistency: Thin  Diet effective now       Question Answer Comment  Room service appropriate? Yes   Fluid consistency: Thin      11/10/23 1127            DVT prophylaxis: SCDs Start: 11/10/23 0525   Lab Results  Component Value Date   PLT 242 11/11/2023      Code Status: Full Code  Family Communication: No family at bedside  Status is:  Inpatient Remains inpatient appropriate because: Severity of illness   Level of care: Telemetry Medical  Consultants:  Neurosurgery  Objective: Vitals:   11/10/23 2341 11/11/23 0306 11/11/23 0309 11/11/23 0753  BP: (!) 113/58 123/61 123/61 133/70  Pulse: 65 69 69 67  Resp: 17 17 17 19   Temp: 98.2 F (36.8 C) 99.2 F (37.3 C) 99.2 F (37.3 C) 98.1 F (36.7 C)  TempSrc: Oral Oral Oral Axillary  SpO2: 93% 94% 94% 92%  Weight:      Height:        Intake/Output Summary (Last 24 hours) at 11/11/2023 1053 Last data filed at 11/11/2023 0406 Gross per 24 hour  Intake 200 ml  Output --  Net 200 ml   Wt Readings from Last 3 Encounters:  11/10/23 83.1 kg  03/12/23 89.8 kg  08/25/21 84.2 kg    Examination:  Constitutional: NAD Eyes: lids and conjunctivae normal, no scleral icterus ENMT: mmm Neck: normal, supple Respiratory: clear to auscultation bilaterally, no wheezing, no crackles. Normal respiratory effort.  Cardiovascular: Regular rate and rhythm, no murmurs / rubs / gallops. No LE edema. Abdomen: soft, no distention, no tenderness. Bowel sounds positive.   Data Reviewed: I have independently reviewed following labs and imaging studies  CBC Recent Labs  Lab 11/10/23 0200 11/10/23 0650 11/11/23 0618  WBC 16.1* 11.5* 10.1  HGB 12.6 12.0 11.9*  HCT 37.5 35.6* 36.2  PLT 265 258 242  MCV 84.7 84.8 85.2  MCH 28.4 28.6 28.0  MCHC 33.6 33.7 32.9  RDW 14.1 14.1 14.5  LYMPHSABS 2.4  --   --   MONOABS 0.9  --   --   EOSABS 0.5  --   --   BASOSABS 0.1  --   --     Recent Labs  Lab 11/10/23 0200 11/10/23 0650 11/11/23 0618  NA 137 137 137  K 4.1 3.9 3.8  CL 101 105 108  CO2 22 24 23   GLUCOSE 144* 136* 111*  BUN 16 16 19   CREATININE 0.85 1.00 1.19*  CALCIUM 9.2 8.9 8.8*  AST 24 23 17   ALT 12 12 13   ALKPHOS 96 94 87  BILITOT 0.5 0.5 0.5  ALBUMIN 3.3* 3.1* 3.1*  MG  --   --  2.0  TSH  --  4.930*  --   HGBA1C  --  7.0*  --      ------------------------------------------------------------------------------------------------------------------ No results for input(s): "CHOL", "HDL", "LDLCALC", "TRIG", "CHOLHDL", "LDLDIRECT" in the last 72 hours.  Lab Results  Component Value Date   HGBA1C 7.0 (H) 11/10/2023   ------------------------------------------------------------------------------------------------------------------ Recent Labs    11/10/23 0650  TSH 4.930*    Cardiac Enzymes No results for input(s): "CKMB", "TROPONINI", "MYOGLOBIN" in the last 168 hours.  Invalid input(s): "CK" ------------------------------------------------------------------------------------------------------------------    Component Value Date/Time   BNP 47.3 12/31/2019 0413    CBG: Recent Labs  Lab 11/10/23 0818 11/10/23 1325 11/10/23 1644 11/10/23 2119 11/11/23 0613  GLUCAP 129* 112* 121* 196* 107*    No results found  for this or any previous visit (from the past 240 hours).   Radiology Studies: No results found.    Pamella Pert, MD, PhD [facility]  Between 7 am - 7 pm I am available, please contact me via Amion (for emergencies) or Securechat (non urgent messages)  Between 7 pm - 7 am I am not available, please contact night coverage MD/APP via Amion

## 2023-11-12 ENCOUNTER — Other Ambulatory Visit (HOSPITAL_COMMUNITY): Payer: Self-pay

## 2023-11-12 DIAGNOSIS — I609 Nontraumatic subarachnoid hemorrhage, unspecified: Secondary | ICD-10-CM | POA: Diagnosis not present

## 2023-11-12 DIAGNOSIS — F3289 Other specified depressive episodes: Secondary | ICD-10-CM

## 2023-11-12 LAB — GLUCOSE, CAPILLARY
Glucose-Capillary: 129 mg/dL — ABNORMAL HIGH (ref 70–99)
Glucose-Capillary: 142 mg/dL — ABNORMAL HIGH (ref 70–99)
Glucose-Capillary: 254 mg/dL — ABNORMAL HIGH (ref 70–99)

## 2023-11-12 MED ORDER — ESCITALOPRAM OXALATE 20 MG PO TABS
20.0000 mg | ORAL_TABLET | Freq: Every day | ORAL | 0 refills | Status: AC
  Filled 2023-11-12: qty 30, 30d supply, fill #0

## 2023-11-12 MED ORDER — LEVETIRACETAM IN NACL 500 MG/100ML IV SOLN
500.0000 mg | Freq: Two times a day (BID) | INTRAVENOUS | 0 refills | Status: DC
  Filled 2023-11-12: qty 1200, 6d supply, fill #0

## 2023-11-12 MED ORDER — LEVETIRACETAM 500 MG PO TABS
500.0000 mg | ORAL_TABLET | Freq: Two times a day (BID) | ORAL | 0 refills | Status: AC
Start: 2023-11-12 — End: 2023-11-18
  Filled 2023-11-12: qty 12, 6d supply, fill #0

## 2023-11-12 NOTE — Plan of Care (Signed)

## 2023-11-12 NOTE — Progress Notes (Signed)
Occupational Therapy Treatment Patient Details Name: Caitlin Harrington MRN: [id number] DOB: 1946-07-14 Today's Date: 11/12/2023   History of present illness Patient is a 77 year old female presenting after mechanical fall at home with head injury. Found to have small bilateral frontal SAH/contusions. History of falls, memory defict, obesity, HTN, gait disturbance.   OT comments  Patient up in recliner reporting discomfort. Patient with improved cognition, however remains impulsive, requires cues for safety, and demonstrates decreased awareness. Able to complete ADL management at contact guard assist level. Due to decreased cognition and overall safety awareness, it is imperative for patient to have family complete medication management in order to prevent polypharmacy and abuse. OT recommending HHOT at discharge; will continue to follow acutely.       If plan is discharge home, recommend the following:  A little help with walking and/or transfers;A little help with bathing/dressing/bathroom;Direct supervision/assist for medications management;Direct supervision/assist for financial management;Assist for transportation;Help with stairs or ramp for entrance;Supervision due to cognitive status   Equipment Recommendations  Other (comment) (will continue to assess)    Recommendations for Other Services      Precautions / Restrictions Precautions Precautions: Fall Restrictions Weight Bearing Restrictions Per Provider Order: No       Mobility Bed Mobility Overal bed mobility: Needs Assistance Bed Mobility: Sit to Supine       Sit to supine: Contact guard assist   General bed mobility comments: CGA for safety    Transfers Overall transfer level: Needs assistance Equipment used: None Transfers: Sit to/from Stand Sit to Stand: Contact guard assist           General transfer comment: CGA for safety     Balance Overall balance assessment: Needs assistance Sitting-balance  support: Feet supported Sitting balance-Leahy Scale: Fair     Standing balance support: Bilateral upper extremity supported Standing balance-Leahy Scale: Poor Standing balance comment: external support required                           ADL either performed or assessed with clinical judgement   ADL Overall ADL's : Needs assistance/impaired     Grooming: Set up;Standing;Wash/dry hands               Lower Body Dressing: Contact guard assist;Sit to/from stand;Sitting/lateral leans Lower Body Dressing Details (indicate cue type and reason): managing brief at toilet Toilet Transfer: Contact guard Nurse, adult Details (indicate cue type and reason): minimally impulsive Toileting- Clothing Manipulation and Hygiene: Contact guard assist;Sit to/from stand;Sitting/lateral lean       Functional mobility during ADLs: Contact guard assist;Cueing for safety;Cueing for sequencing General ADL Comments: Patient up in recliner reporting discomfort. Patient with improved cognition, however remains impulsive, requires cues for safety, and demonstrates decreased awareness. Able to complete ADL management at contact guard assist level. Due to decreased cognition and overall safety awareness, it is imperative for patient to have family complete medication management in order to prevent polypharmacy and abuse. OT recommending HHOT at discharge; will continue to follow acutely.    Extremity/Trunk Assessment              Vision   Additional Comments: Decreased left neglect in session to date, R eye with more pronounced black eye and   Perception     Praxis      Cognition Arousal: Alert Behavior During Therapy: WFL for tasks assessed/performed, Flat affect Overall Cognitive Status: History of cognitive impairments - at baseline  General Comments: oriented to person, situation, month and year.  patient is able to follow single step commands with increased time and multi modal cues. able to recall events that led to fall        Exercises      Shoulder Instructions       General Comments VSS on RA    Pertinent Vitals/ Pain       Pain Assessment Pain Assessment: Faces Faces Pain Scale: Hurts a little bit Pain Location: all over Pain Descriptors / Indicators: Discomfort, Guarding Pain Intervention(s): Limited activity within patient's tolerance, Monitored during session, Repositioned  Home Living                                          Prior Functioning/Environment              Frequency  Min 1X/week        Progress Toward Goals  OT Goals(current goals can now be found in the care plan section)  Progress towards OT goals: Progressing toward goals  Acute Rehab OT Goals Patient Stated Goal: to go home OT Goal Formulation: With patient Time For Goal Achievement: 11/24/23 Potential to Achieve Goals: Good  Plan      Co-evaluation                 AM-PAC OT "6 Clicks" Daily Activity     Outcome Measure   Help from another person eating meals?: A Little Help from another person taking care of personal grooming?: A Little Help from another person toileting, which includes using toliet, bedpan, or urinal?: A Little Help from another person bathing (including washing, rinsing, drying)?: A Little Help from another person to put on and taking off regular upper body clothing?: A Little Help from another person to put on and taking off regular lower body clothing?: A Little 6 Click Score: 18    End of Session Equipment Utilized During Treatment: Gait belt  OT Visit Diagnosis: Unsteadiness on feet (R26.81);Other abnormalities of gait and mobility (R26.89);Repeated falls (R29.6);Muscle weakness (generalized) (M62.81);History of falling (Z91.81);Other symptoms and signs involving cognitive function;Pain   Activity Tolerance Patient  tolerated treatment well   Patient Left in bed;with call bell/phone within reach;with bed alarm set;with nursing/sitter in room   Nurse Communication Mobility status        Time: 1660-6301 OT Time Calculation (min): 8 min  Charges: OT General Charges $OT Visit: 1 Visit OT Treatments $Self Care/Home Management : 8-22 mins  Pollyann Glen E. Damiel Barthold, OTR/L Acute Rehabilitation Services [phone]   Cherlyn Cushing 11/12/2023, 12:48 PM

## 2023-11-12 NOTE — Progress Notes (Signed)
Physical Therapy Treatment Patient Details Name: Caitlin Harrington MRN: [id number] DOB: 04/22/1946 Today's Date: 11/12/2023   History of Present Illness Patient is a 78 year old female presenting after mechanical fall at home with head injury. Found to have small bilateral frontal SAH/contusions. History of falls, memory defict, obesity, HTN, gait disturbance.    PT Comments  Pt greeted resting in bed and agreeable to session with encouragement with pt demonstrating good progress towards acute goals. Pt able to progress gait distance with RW for support and CGA for safety with no LOB noted; some R drift in hall with head turning to R with pt able to recognize with prompts and correct. Pt was educated on continued walker use to maximize functional independence, safety, and decrease risk for falls.  Pt continues to benefit from skilled PT services to progress toward functional mobility goals.     If plan is discharge home, recommend the following: A lot of help with walking and/or transfers;A lot of help with bathing/dressing/bathroom;Assistance with cooking/housework;Help with stairs or ramp for entrance;Supervision due to cognitive status;Direct supervision/assist for medications management   Can travel by private vehicle        Equipment Recommendations  Rolling walker (2 wheels)    Recommendations for Other Services Rehab consult     Precautions / Restrictions Precautions Precautions: Fall Restrictions Weight Bearing Restrictions Per Provider Order: No     Mobility  Bed Mobility Overal bed mobility: Needs Assistance Bed Mobility: Supine to Sit, Sit to Supine     Supine to sit: Min assist     General bed mobility comments: light min A to elevate trunk with pt reaching for this PTAs hand    Transfers Overall transfer level: Needs assistance Equipment used: Rolling walker (2 wheels) Transfers: Sit to/from Stand Sit to Stand: Contact guard assist           General  transfer comment: CGA for safety    Ambulation/Gait Ambulation/Gait assistance: Contact guard assist Gait Distance (Feet): 150 Feet Assistive device: Rolling walker (2 wheels) Gait Pattern/deviations: Decreased stride length, Drifts right/left Gait velocity: decreased     General Gait Details: CGA for safety, no LOB noted, some R drift in hall with head turns to R, pt able to recognize drfit with prompts and correct, some left neglect with head slightly turned to R throughout   Stairs             Wheelchair Mobility     Tilt Bed    Modified Rankin (Stroke Patients Only)       Balance Overall balance assessment: Needs assistance Sitting-balance support: Feet supported Sitting balance-Leahy Scale: Fair     Standing balance support: Bilateral upper extremity supported Standing balance-Leahy Scale: Poor Standing balance comment: external support required                            Cognition Arousal: Alert Behavior During Therapy: WFL for tasks assessed/performed, Flat affect Overall Cognitive Status: History of cognitive impairments - at baseline                                 General Comments: oriented to person, situation, month and year. patient is able to follow single step commands with increased time and multi modal cues. able to recall events that led to fall        Exercises  General Comments General comments (skin integrity, edema, etc.): VSS on RA      Pertinent Vitals/Pain Pain Assessment Pain Assessment: Faces Faces Pain Scale: Hurts a little bit Pain Location: all over Pain Descriptors / Indicators: Discomfort, Guarding Pain Intervention(s): Monitored during session, Limited activity within patient's tolerance, Repositioned    Home Living                          Prior Function            PT Goals (current goals can now be found in the care plan section) Acute Rehab PT Goals Patient Stated  Goal: to return home PT Goal Formulation: With patient Time For Goal Achievement: 11/24/23 Progress towards PT goals: Progressing toward goals    Frequency    Min 1X/week      PT Plan      Co-evaluation              AM-PAC PT "6 Clicks" Mobility   Outcome Measure  Help needed turning from your back to your side while in a flat bed without using bedrails?: A Little Help needed moving from lying on your back to sitting on the side of a flat bed without using bedrails?: A Little Help needed moving to and from a bed to a chair (including a wheelchair)?: A Little Help needed standing up from a chair using your arms (e.g., wheelchair or bedside chair)?: A Little Help needed to walk in hospital room?: A Little Help needed climbing 3-5 steps with a railing? : A Lot 6 Click Score: 17    End of Session Equipment Utilized During Treatment: Gait belt Activity Tolerance: Patient tolerated treatment well Patient left: in chair;with call bell/phone within reach;with chair alarm set Nurse Communication: Mobility status PT Visit Diagnosis: Unsteadiness on feet (R26.81);Muscle weakness (generalized) (M62.81)     Time: 1610-9604 PT Time Calculation (min) (ACUTE ONLY): 17 min  Charges:    $Gait Training: 8-22 mins PT General Charges $$ ACUTE PT VISIT: 1 Visit                     Carmon Brigandi R. PTA Acute Rehabilitation Services Office: [phone]   Catalina Antigua 11/12/2023, 12:15 PM

## 2023-11-12 NOTE — Progress Notes (Signed)
   11/12/23 1348  Spiritual Encounters  Type of Visit Initial  Care provided to: Pt and family  Conversation partners present during encounter Nurse  Referral source Nurse (RN/NT/LPN)  Reason for visit Advance directives  OnCall Visit No  Advance Directives (For Healthcare)  Does Patient Have a Medical Advance Directive? No  Would patient like information on creating a medical advance directive? Yes (Inpatient - patient defers creating a medical advance directive at this time - Information given)   Chaplain provided AD education and paperwork.   Arlyce Dice, Chaplain Resident [phone]

## 2023-11-12 NOTE — Plan of Care (Signed)
  Problem: Nutritional: Goal: Maintenance of adequate nutrition will improve Outcome: Progressing   Problem: Skin Integrity: Goal: Risk for impaired skin integrity will decrease Outcome: Progressing   Problem: Safety: Goal: Ability to remain free from injury will improve Outcome: Progressing

## 2023-11-12 NOTE — TOC Transition Note (Signed)
Transition of Care [facility]) - Discharge Note   Patient Details  Name: Caitlin Harrington MRN: [id number] Date of Birth: 04-Dec-1945  Transition of Care [facility]) CM/SW Contact:  Lockie Pares, RN Phone Number: 11/12/2023, 10:38 AM   Clinical Narrative:    Spoke to patient via phone. She  chooses to go home with [facility] services. Walker ordered. [id number]- Amy accepted for [facility] services  Patient will be at sons house, Amy will call for set up for tomorrow.  No other needs voiced by patient.    Final next level of care: Home w Home Health Services Barriers to Discharge: No Barriers Identified   Patient Goals and CMS Choice   CMS Medicare.gov Compare Post Acute Care list provided to:: Patient Choice offered to / list presented to : Patient      Discharge Placement             Home health and DME          Discharge Plan and Services Additional resources added to the After Visit Summary for     Discharge Planning Services: CM Consult Post Acute Care Choice: IP Rehab          DME Arranged: Dan Humphreys rolling DME Agency: [employer] Date DME Agency Contacted: 11/12/23 Time DME Agency Contacted: [phone] Representative spoke with at DME Agency: Vaughan Basta HH Arranged: PT, OT, Nurse's Aide, RN, Social Work [employer] Agency: [facility] Date [facility] Agency Contacted: 11/12/23 Time HH Agency Contacted: 1035 Representative spoke with at [facility] Agency: Amy  Social Drivers of Health (SDOH) Interventions SDOH Screenings   Food Insecurity: No Food Insecurity (11/10/2023)  Housing: Low Risk  (11/10/2023)  Transportation Needs: No Transportation Needs (11/10/2023)  Utilities: Not At Risk (11/10/2023)  Alcohol Screen: Low Risk  (11/06/2020)  Depression (PHQ2-9): Low Risk  (02/02/2022)  Financial Resource Strain: Low Risk  (12/26/2021)  Physical Activity: Inactive (11/06/2020)  Social Connections: Unknown (11/06/2020)  Stress: No Stress Concern Present (02/02/2022)  Tobacco Use: Low Risk   (11/10/2023)     Readmission Risk Interventions    05/27/2021    3:37 PM  Readmission Risk Prevention Plan  Transportation Screening Complete  Home Care Screening Complete  Medication Review (RN CM) Complete

## 2023-11-12 NOTE — Discharge Instructions (Addendum)
Follow with Caitlin Hatch, PA-C in 5-7 days  Continue prophylactic antiseizure medications (Leviracetam or Keppra) for 6 more days  Hold Aspirin and Plavix (Clopidogrel) for 14 days  Please get a complete blood count and chemistry panel checked by your Primary MD at your next visit, and again as instructed by your Primary MD. Please get your medications reviewed and adjusted by your Primary MD.  Please request your Primary MD to go over all Hospital Tests and Procedure/Radiological results at the follow up, please get all Hospital records sent to your Prim MD by signing hospital release before you go home.  In some cases, there will be blood work, cultures and biopsy results pending at the time of your discharge. Please request that your primary care M.D. goes through all the records of your hospital data and follows up on these results.  If you had Pneumonia of Lung problems at the Hospital: Please get a 2 view Chest X ray done in 6-8 weeks after hospital discharge or sooner if instructed by your Primary MD.  If you have Congestive Heart Failure: Please call your Cardiologist or Primary MD anytime you have any of the following symptoms:  1) 3 pound weight gain in 24 hours or 5 pounds in 1 week  2) shortness of breath, with or without a dry hacking cough  3) swelling in the hands, feet or stomach  4) if you have to sleep on extra pillows at night in order to breathe  Follow cardiac low salt diet and 1.5 lit/day fluid restriction.  If you have diabetes Accuchecks 4 times/day, Once in AM empty stomach and then before each meal. Log in all results and show them to your primary doctor at your next visit. If any glucose reading is under 80 or above 300 call your primary MD immediately.  If you have Seizure/Convulsions/Epilepsy: Please do not drive, operate heavy machinery, participate in activities at heights or participate in high speed sports until you have seen by Primary MD or a  Neurologist and advised to do so again. Per [facility] statutes, patients with seizures are not allowed to drive until they have been seizure-free for six months.  Use caution when using heavy equipment or power tools. Avoid working on ladders or at heights. Take showers instead of baths. Ensure the water temperature is not too high on the home water heater. Do not go swimming alone. Do not lock yourself in a room alone (i.e. bathroom). When caring for infants or small children, sit down when holding, feeding, or changing them to minimize risk of injury to the child in the event you have a seizure. Maintain good sleep hygiene. Avoid alcohol.   If you had Gastrointestinal Bleeding: Please ask your Primary MD to check a complete blood count within one week of discharge or at your next visit. Your endoscopic/colonoscopic biopsies that are pending at the time of discharge, will also need to followed by your Primary MD.  Get Medicines reviewed and adjusted. Please take all your medications with you for your next visit with your Primary MD  Please request your Primary MD to go over all hospital tests and procedure/radiological results at the follow up, please ask your Primary MD to get all Hospital records sent to his/her office.  If you experience worsening of your admission symptoms, develop shortness of breath, life threatening emergency, suicidal or homicidal thoughts you must seek medical attention immediately by calling 911 or calling your MD immediately  if symptoms  less severe.  You must read complete instructions/literature along with all the possible adverse reactions/side effects for all the Medicines you take and that have been prescribed to you. Take any new Medicines after you have completely understood and accpet all the possible adverse reactions/side effects.   Do not drive or operate heavy machinery when taking Pain medications.   Do not take more than prescribed Pain, Sleep and  Anxiety Medications  Special Instructions: If you have smoked or chewed Tobacco  in the last 2 yrs please stop smoking, stop any regular Alcohol  and or any Recreational drug use.  Wear Seat belts while driving.  Please note You were cared for by a hospitalist during your hospital stay. If you have any questions about your discharge medications or the care you received while you were in the hospital after you are discharged, you can call the unit and asked to speak with the hospitalist on call if the hospitalist that took care of you is not available. Once you are discharged, your primary care physician will handle any further medical issues. Please note that NO REFILLS for any discharge medications will be authorized once you are discharged, as it is imperative that you return to your primary care physician (or establish a relationship with a primary care physician if you do not have one) for your aftercare needs so that they can reassess your need for medications and monitor your lab values.  You can reach the hospitalist office at phone [phone] or fax [phone]   If you do not have a primary care physician, you can call [phone] for a physician referral.  Activity: As tolerated with Full fall precautions use walker/cane & assistance as needed    Diet: regular  Disposition Home

## 2023-11-12 NOTE — Consult Note (Signed)
[facility] Follow-[facility]  Patient Name: .Caitlin Harrington  MRN: [id number]  DOB: Jun 29, 1946  Consult Order details:  Orders (From admission, onward)     Start     Ordered   11/11/23 0836  IP CONSULT TO PSYCHIATRY       Ordering Provider: Leatha Gilding, MD  Provider:  (Not yet assigned)  Question Answer Comment  Location [facility]   Reason for Consult? recent SI with plans to overdose on home meds. History of SI at 19 with overdose      11/11/23 0836             Mode of Visit: In person    Psychiatry Consult Evaluation  Service Date: November 12, 2023 LOS:  LOS: 2 days  (since 11/10/2023)  Chief Complaint follow-up head injury  Primary Psychiatric Diagnoses  MDD, recurrent, moderate, without psychotic features GAD  Assessment  Caitlin Harrington is a 77 y.o. female admitted: Medicallyfor 11/10/2023 12:34 AM for subarachnoid hemorrhage. She carries past psychiatric diagnoses of MDD and has a past medical history of Asthma, type II DM, Gout, CVA on aspirin and Plavix, Obesity, Vit D deficiency, HTN, Hypothyroidism.   Her current presentation of worsening depressive symptoms is most consistent with MDD, recurrent, severe, without psychotic features and GAD. She does not meet criteria for IVC based on lack of current or recent SI, lack of safety concerns from son, having a dog at home to live for, and social support / safety planning from son and his spouse.  Current outpatient psychotropic medications include escitalopram and historically she has had a poor response to this medication and reports having had a poor response to fluoxetine in remote past.  Discussed behavioral modifications with patient to improve depression including mindfulness (e.g. imaging she is on beach since she loves the [place]), adequate sleep hygiene, therapy (not interested in), and activities to keep her busy (e.g. word puzzles, books, etc). Engaged in solution focused  brief therapy and identified beach/rain noises as something that calms her and took action toward this by downloading app on her phone to play various beach/rain noises to support mindfulness.  Furthermore, we titrated up escitalopram to 20 mg and is tolerating okay. On initial examination, patient has depressed mood but is denying SI currently or recently. Continues lack of safety concern on continued assessment. Safety planned with son and will include resources in discharge info.  Please see plan below for detailed recommendations.   Diagnoses:  Active Hospital problems: Principal Problem:   SAH (subarachnoid hemorrhage) (HCC) Active Problems:   Subarachnoid hemorrhage (HCC)    Plan   ## Psychiatric Medication Recommendations:  -- Continue escitalopram 20 mg once daily for MDD -- Continue levothyroxine, consider increasing dose if continued elevated TSH -- Could consider adjuncts including vitamin D, fish oils, etc.   ## Medical Decision Making Capacity: Not specifically addressed in this encounter  ## Further Work-up:  -- Agree with rechecking TSH per primary team note, could consider vitamin D level  -- most recent EKG on 11/10/23 had QtC of 429 -- Pertinent labwork reviewed earlier this admission includes:  TSH elevated at 4.9, A1c 7.0, UA glucosuria ketonuria, CBC hemoglobin 11.9  ## Disposition:-- There are no psychiatric contraindications to discharge at this time.  Can follow up with primary care physician for continued medication management.  Not interested in psychotherapy at this time.  Included information for behavioral health urgent care on discharge paperwork for any worsening depression/anxiety including  SI.  ## Behavioral / Environmental: -Utilize compassion and acknowledge the patient's experiences while setting clear and realistic expectations for care.  ## Safety and Observation Level:  - Based on my clinical evaluation, I estimate the patient to be at low risk of  self harm in the current setting. - At this time, we recommend routine.  Discontinued 1:1 monitoring on 11/11/23. This decision is based on my review of the chart including patient's history and current presentation, interview of the patient, mental status examination, and consideration of suicide risk including evaluating suicidal ideation, plan, intent, suicidal or self-harm behaviors, risk factors, and protective factors. This judgment is based on our ability to directly address suicide risk, implement suicide prevention strategies, and develop a safety plan while the patient is in the clinical setting. Please contact our team if there is a concern that risk level has changed.  CSSR Risk Category:C-SSRS RISK CATEGORY: No Risk (previous assessment)  Suicide Risk Assessment: Patient has following modifiable risk factors for suicide: under treated depression , which we are addressing by increasing escitalopram and discussing behavioral interventions. Patient has following non-modifiable or demographic risk factors for suicide: separation or divorce, history of suicide attempt, history of self harm behavior, and psychiatric hospitalization Patient has the following protective factors against suicide: Access to outpatient mental health care, Supportive family, and Pets in the home. Safety planned with son.    Thank you for this consult request. Recommendations have been communicated to the primary team.  We will sign off at this time.   Meryl Dare, MD       History of Present Illness  Relevant Aspects of [facility] Course:  77 year old female with DM2, prior CVA on aspirin and Plavix, mild memory deficits, HTN, hypothyroidism, recurrent falls comes in to the hospital with another fall with head injury. She does not recall much about the fall, but has had 3 prior falls in the last 6 months. On admission she was found to have mild subarachnoid hemorrhage within the left parafalcine frontal  lobe. Neurosurgery was consulted and she was admitted to the hospital.   Patient Report:  Patient reports that she is doing okay this morning.  Reports that she slept okay.  Reports that she is eating okay and is actually excited for breakfast.  Discussed that she had family visit yesterday including her son, daughter-in-law, and sister.  Reports that she has not seen her sister in a very long time and that it was nice to see her.  Discussed that she would like to see her in the future and encouraged her to reach out to her sister to maintain good social part in her life.  Discussed that disposition plan will likely still be going to her son's house, which she was grateful for today (he had not been very grateful for this in the past).  Engaged in solution-focused brief therapy in which patient was asked to identify previous coping mechanisms that were effective for her.  She identified that prior to sleep in the past she has used rain sounds to help calm her (this was something she also discussed yesterday when talking about things she enjoys including sensations on the beach including wave noises).  We discussed how we could reintroduce this into her life and decided on downloading a Smart phone app for beach and rain noises.  I taught her to use the app, given she is not great with phones.  Patient was able to demonstrate how to use the app including  opening and closing and switching between different noises (and navigating the advertisements that pop up).  She immediately had subjective aberrancy and appear to have objective) benefit from the calming sounds.  Encouraged patient to use the sounds in combination with visualizations and meditation when she notices her breath and internal sensations.  Patient denies SI, HI, AVH.  Since increasing SSRI,  patient denies common side effects including nausea, dry mouth, insomnia, diarrhea, headache, dizziness, agitation or anxiety, drowsiness, and sexual  dysfunction.   Psych ROS:  Depression: Endorses significant depressed mood and loss of interest.  Denies suicidal ideation.  Endorses low energy, lack of concentration, issues with sleep, lack of appetite at times and overeating at times.  Denies guilt/worthlessness. See HADS below for depression sub-section.  Anxiety: Endorses having some generalized anxiety.  Denies panic attacks, social anxiety, specific phobias, obsessions or compulsions.  Endorses being a Product/process development scientist.  See HADS below for anxiety sub-section.  Mania (lifetime and current): Denies Psychosis: (lifetime and current): Denies  Collateral information:     Son reports that he does not have safety concerns for the patient. Reports that pt told him that she has not had any suicidal thoughts and this conversation with primary was taken out of context from her saying that she had made a suicide attempt in the past. Reports that he did not think she needed to be on "suicide watch."  Reports that there his entire life he has never experienced his mom make any suicidal gestures.  Discussed safety planning including having him only give the patient her daily dose of medication so she does not have access to unsafe amounts of medications for OD.  Furthermore discussed with any concern that they can go to [facility], which has been included in the discharge paperwork.  Son understood and was open to these suggestions.  Review of Systems  Constitutional:  Negative for fever.  Cardiovascular:  Negative for chest pain and palpitations.  Gastrointestinal:  Negative for constipation, diarrhea, nausea and vomiting.  Neurological:  Negative for dizziness, weakness and headaches.     Psychiatric and Social History  Psychiatric History:  Information collected from patient, chart, son  Prev Dx/Sx: MDD Current Psych Provider: None, likely PCP Home Meds (current): Escitalopram 10 mg once daily Previous Med Trials:  Fluoxetine which was ineffective after a year Therapy: Denies  Prior Psych Hospitalization:  (consulted in 2021 for capacity / severe depression) Prior Self Harm: SA by OD at 77 y/o.  Prior Violence: Denies  Family Psych History: Substance use disorder in first-degree relatives.  Bipolar in second relative. 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  Reported that she was released from the job due to cuts in funding which affected her retirement funding and she is spiteful of.  Legal Hx: Denies Living Situation: Was living independently in her home.  Reported that she recently moved in with her partner after her stroke.  Then she found out that he was cheating on her and stealing from her.  She now is living with her son due to her need for support with ADLs. Spiritual Hx: Not religious.  "Half and half."  Did not clarify this quote. Access to weapons/lethal means: No  Substance History Alcohol: Denies Tobacco: Denies Illicit drugs: Denies Prescription drug abuse: Denies Rehab hx: Denies  Exam Findings  Physical Exam:  Vital Signs:  Temp:  [97.5 F (36.4 C)-98.6 F (37 C)] 97.5 F (36.4 C) (12/20 0748) Pulse Rate:  [59-73] 59 (12/20 0748) Resp:  [17-18] 17 (12/20 0748) BP: (127-144)/(69-109) 142/92 (12/20 0748) SpO2:  [94 %-96 %] 96 % (12/20 0748) Blood pressure (!) 142/92, pulse (!) 59, temperature (!) 97.5 F (36.4 C), temperature source Oral, resp. rate 17, height 5' (1.524 m), weight 83.1 kg, SpO2 96%. Body mass index is 35.78 kg/m.  Physical Exam Vitals and nursing note reviewed.  Pulmonary:     Effort: Pulmonary effort is normal.  Neurological:     Mental Status: She is alert and oriented to person, place, and time.    Mental Status Exam: General Appearance: Casual  Orientation:  Full (Time, Place, and  Person)  Memory:  Immediate;   Good Recent;   Good Remote;   Good  Concentration:  Concentration: Good  Recall:  Good  Attention  Good  Eye Contact:  Good  Speech:  Normal Rate  Language:  Good  Volume:  Normal  Mood: euthymic  Affect:  Congruent  Thought Process:  Coherent  Thought Content:  Logical  Suicidal Thoughts:  No  Homicidal Thoughts:  No  Judgement:  Fair  Insight:  Fair  Psychomotor Activity:  Decreased  Akathisia:  No  Fund of Knowledge:  Good      Assets:  Communication Skills Desire for Improvement Housing Social Support  Cognition:  WNL  ADL's:  Impaired  AIMS (if indicated):   not indicated     Hospital Anxiety Depression Scale (HADS) 11/11/2023  Anxiety Subscale (HADS-A): 12 1. I feel tense or 'wound up': 2 2. I get a sort of frightened feeling as if something awful is about to happen: 2 3. Worrying thoughts go through my mind: 2 4. I can sit at ease and feel relaxed: 2 5. I get a sort of frightened feeling like 'butterflies' in the stomach: 1 6. I feel restless as if I have to be on the move: 2 7. I get sudden feelings of panic: 1  Depression Subscale (HADS-D): 15 1. I still enjoy the things I used to enjoy: 3 2. I can laugh and see the funny side of things: 2 3. I feel cheerful: 0 4. I feel as if I am slowed down: 1 5. I have lost interest in my appearance: 3 6. I look forward with enjoyment to things: 3 7. I can enjoy a good book or radio or TV program: 3  Each item is scored on a scale from 0 to 3, with higher scores indicating greater levels of anxiety or depression. The total score for each subscale ranges from 0 to 21, with scores of 8-10 indicating mild symptoms, 11-14 indicating moderate symptoms, and 15-21 indicating severe symptoms.   Other History   These have been pulled in through the EMR, reviewed, and updated if appropriate.  Family History:  The patient's family history includes Cancer in her sister; Diabetes in her  maternal grandfather; Other in her brother and mother.  Medical History: Past Medical History:  Diagnosis Date   Asthma    COVID-19 12/2019   Depression    Diabetes mellitus without complication (HCC)    type 2   Fatigue    Gout    Hypertension    Insomnia    Numbness    Obesity    Paresthesia    Stroke (HCC) 10/2020   Vitamin D deficiency     Surgical History: Past  Surgical History:  Procedure Laterality Date   ABDOMINAL HYSTERECTOMY     BUBBLE STUDY  05/06/2021   Procedure: BUBBLE STUDY;  Surgeon: Vesta Mixer, MD;  Location: [facility] ENDOSCOPY;  Service: Cardiovascular;;   cataracts  2018   COLONOSCOPY WITH PROPOFOL N/A 03/12/2023   Procedure: COLONOSCOPY WITH PROPOFOL;  Surgeon: Jeani Hawking, MD;  Location: [facility] ENDOSCOPY;  Service: Gastroenterology;  Laterality: N/A;   POLYPECTOMY  03/12/2023   Procedure: POLYPECTOMY;  Surgeon: Jeani Hawking, MD;  Location: [facility] ENDOSCOPY;  Service: Gastroenterology;;  x 2 cold snare   TEE WITHOUT CARDIOVERSION N/A 05/06/2021   Procedure: TRANSESOPHAGEAL ECHOCARDIOGRAM (TEE);  Surgeon: Vesta Mixer, MD;  Location: [facility] ENDOSCOPY;  Service: Cardiovascular;  Laterality: N/A;     Medications:   Current Facility-Administered Medications:    acetaminophen (TYLENOL) tablet 650 mg, 650 mg, Oral, Q6H PRN **OR** acetaminophen (TYLENOL) suppository 650 mg, 650 mg, Rectal, Q6H PRN, Elvera Lennox, Costin M, MD   albuterol (PROVENTIL) (2.5 MG/3ML) 0.083% nebulizer solution 2.5 mg, 2.5 mg, Nebulization, Q2H PRN, Lurline Del, MD   allopurinol (ZYLOPRIM) tablet 300 mg, 300 mg, Oral, Daily, Skip Mayer A, MD, 300 mg at 11/11/23 1016   carvedilol (COREG) tablet 6.25 mg, 6.25 mg, Oral, BID WC, Gherghe, Costin M, MD, 6.25 mg at 11/12/23 1005   escitalopram (LEXAPRO) tablet 20 mg, 20 mg, Oral, Daily, Gilman Buttner, Sidhant Helderman, MD   insulin aspart (novoLOG) injection 0-9 Units, 0-9 Units, Subcutaneous, TID WC, Lurline Del, MD, 1 Units at 11/12/23 1002    levETIRAcetam (KEPPRA) IVPB 500 mg/100 mL premix, 500 mg, Intravenous, BID, Patrici Ranks Malta, PA-C, Stopped at 11/11/23 2154   levothyroxine (SYNTHROID) tablet 50 mcg, 50 mcg, Oral, QAC breakfast, Leatha Gilding, MD, 50 mcg at 11/12/23 1005   morphine (PF) 2 MG/ML injection 1 mg, 1 mg, Intravenous, Q4H PRN, Skip Mayer A, MD   ondansetron (ZOFRAN) tablet 4 mg, 4 mg, Oral, Q6H PRN **OR** ondansetron (ZOFRAN) injection 4 mg, 4 mg, Intravenous, Q6H PRN, Lurline Del, MD   Oral care mouth rinse, 15 mL, Mouth Rinse, PRN, Elvera Lennox, Daylene Katayama, MD   traMADol (ULTRAM) tablet 25 mg, 25 mg, Oral, Q6H PRN, Leatha Gilding, MD, 25 mg at 11/10/23 1724  Allergies: Allergies  Allergen Reactions   Lactose Intolerance (Gi) Nausea And Vomiting   Meloxicam     Other reaction(s): rash   Amlodipine Other (See Comments)    Other reaction(s): headache Other reaction(s): headache   Atorvastatin Hives and Rash    chills Other reaction(s): chills   Doxycycline Rash    Other reaction(s): rash   Ibuprofen Hives    Other reaction(s): hives   Meloxicam Hives and Rash   Statins Hives and Rash    Meryl Dare, MD

## 2023-11-12 NOTE — Discharge Summary (Signed)
Physician Discharge Summary  Caitlin Harrington ZDG:[id number] DOB: January 21, 1946 DOA: 11/10/2023  PCP: Sheliah Hatch, PA-C  Admit date: 11/10/2023 Discharge date: 11/12/2023  Admitted From: home Disposition:  home (refusing SNF)  Recommendations for Outpatient Follow-up:  Follow up with PCP in 1-2 weeks Continue Keppra for 6 additional days Hold aspirin and Plavix for 14 days per neurosurgery  Home Health: PT, OT, RN, aide, SW Equipment/Devices: walker  Discharge Condition: stable CODE STATUS: Full code  HPI: Per admitting MD, Caitlin Harrington is a 77 y.o. female with medical history significant of Asthma, DMII, Depression, Gout, CVA  on asa/ Plavix with history of residual memory deficit, Obesity , Vit D def, HTN, Hypothyroidism, gait disturbance, who presents to ED s/p mechanical fall  at home with head injury. Patient notes she does not recall much about the fall. She does note that she has had 3 prior falls in the last 6 months. Patient's significant other is not available at this time to assist with history. Details regarding fall is taken from chart review. Patient currently notes  HA, and body aches, she denies cough/n/v/fever /chills but does admit to cold intolerance.  She notes no chest pain or sob.  Hospital Course / Discharge diagnoses: Principal problem Bilateral traumatic subarachnoid hemorrhage-seen on the CT scan, hold her aspirin and Plavix for now and patient was started on Keppra 500 mg twice daily for 7 days per neurosurgery recommendation.  She has remained stable, neurologically at baseline, able to work with physical therapy.  SNF was recommended, however patient prefers to go home.  This was discussed with her family who state that they are able to provide assistance.  Will be discharged home in stable condition.   Active problems Suicidal ideation-patient with concerns for SI, psychiatry consulted, no active thoughts or plans, no need for inpatient psych   Prior CVA with residual memory deficits-hold aspirin and Plavix for now. Per neurosurgery, they recommend holding for 10 to 14 days and could be resumed afterwards. Leukocytosis-possibly reactive.  White count normalized Essential hypertension-continue home regimen on discharge Depression-continue Lexapro, psychiatry increased the dose Hypothyroidism-continue Synthroid.  TSH 4.9, keep the same dose and I would recheck in 3 to 4 weeks DM2-resume home medications on discharge Obesity, class II-BMI 35.78, she would benefit from weight loss  Sepsis ruled out   Discharge Instructions   Allergies as of 11/12/2023       Reactions   Lactose Intolerance (gi) Nausea And Vomiting   Meloxicam    Other reaction(s): rash   Amlodipine Other (See Comments)   Other reaction(s): headache Other reaction(s): headache   Atorvastatin Hives, Rash   chills Other reaction(s): chills   Doxycycline Rash   Other reaction(s): rash   Ibuprofen Hives   Other reaction(s): hives   Meloxicam Hives, Rash   Statins Hives, Rash        Medication List     STOP taking these medications    aspirin EC 81 MG tablet   clopidogrel 75 MG tablet Commonly known as: PLAVIX       TAKE these medications    allopurinol 300 MG tablet Commonly known as: ZYLOPRIM Take 150 mg by mouth daily.   amLODipine 5 MG tablet Commonly known as: NORVASC Take 1 tablet (5 mg total) by mouth daily.   carvedilol 6.25 MG tablet Commonly known as: COREG Take 6.25 mg by mouth 2 (two) times daily with a meal.   escitalopram 20 MG tablet Commonly known as: LEXAPRO Take  1 tablet (20 mg total) by mouth daily. What changed:  medication strength how much to take   hydrOXYzine 25 MG tablet Commonly known as: ATARAX Take 25 mg by mouth at bedtime.   Jardiance 10 MG Tabs tablet Generic drug: empagliflozin Take 10 mg by mouth daily.   levETIRAcetam 500 MG tablet Commonly known as: Keppra Take 1 tablet (500 mg total)  by mouth 2 (two) times daily for 6 days.   levothyroxine 50 MCG tablet Commonly known as: SYNTHROID Take 50 mcg by mouth daily before breakfast.   losartan 25 MG tablet Commonly known as: COZAAR Take 1 tablet (25 mg total) by mouth daily.   metFORMIN 500 MG tablet Commonly known as: GLUCOPHAGE Take 500 mg by mouth 2 (two) times daily with a meal.   multivitamin with minerals tablet Take 1 tablet by mouth daily.   rosuvastatin 20 MG tablet Commonly known as: CRESTOR Take 1 tablet (20 mg total) by mouth daily.   sitaGLIPtin 100 MG tablet Commonly known as: JANUVIA Take 100 mg by mouth daily.               Durable Medical Equipment  (From admission, onward)           Start     Ordered   11/12/23 1027  For home use only DME Walker  Once       Question:  Patient needs a walker to treat with the following condition  Answer:  Subarachnoid hemorrhage (HCC)   11/12/23 1026            Follow-up Information     [facility] Follow up.   Specialty: Urgent Care Why: As needed, If symptoms worsen   24/7 behavioral health urgent care if mental health symptoms worsen including suicidal thoughts. Contact information: [address] [place] [address] [phone]        [facility] Follow up.   Why: they will call you to set up services               Consultations: Neurosurgery Psychiatry   Procedures/Studies:  CT HEAD WO CONTRAST ( ) Result Date: 11/10/2023 CLINICAL DATA:  Subarachnoid hemorrhage [facility]) EXAM: CT HEAD WITHOUT CONTRAST TECHNIQUE: Contiguous axial images were obtained from the base of the skull through the vertex without intravenous contrast. RADIATION DOSE REDUCTION: This exam was performed according to the departmental dose-optimization program which includes automated exposure control, adjustment of the mA and/or kV according to patient size and/or use of iterative reconstruction  technique. COMPARISON:  Head CT 11/10/2023 FINDINGS: Brain: Redemonstrated subarachnoid hemorrhage along the anterior left frontal lobe and posterior right frontal lobe., Unchanged from prior exam. No new sites of hemorrhage identified. No mass effect. No evidence of intraventricular extension. No hydrocephalus. No CT evidence of an acute cortical infarct. Redemonstrated chronic right thalamic infarct. There is a background of moderate to severe chronic microvascular ischemic change. Vascular: No hyperdense vessel or unexpected calcification. Skull: Redemonstrated soft tissue hematoma along the frontal scalp and nasal bridge. Sinuses/Orbits: No middle ear or mastoid effusion. Paranasal sinuses are clear. Bilateral lens replacement. Orbits are otherwise unremarkable. Other: None. IMPRESSION: Unchanged subarachnoid hemorrhage along the anterior left frontal lobe and posterior right frontal lobe. No new sites of hemorrhage identified. No mass effect. Electronically Signed   By: Lorenza Cambridge M.D.   On: 11/10/2023 10:52   CT Cervical Spine Wo Contrast Result Date: 11/10/2023 CLINICAL DATA:  Fall, head injury, scalp hematoma EXAM: CT CERVICAL  SPINE WITHOUT CONTRAST TECHNIQUE: Multidetector CT imaging of the cervical spine was performed without intravenous contrast. Multiplanar CT image reconstructions were also generated. RADIATION DOSE REDUCTION: This exam was performed according to the departmental dose-optimization program which includes automated exposure control, adjustment of the mA and/or kV according to patient size and/or use of iterative reconstruction technique. COMPARISON:  None Available. FINDINGS: Alignment: Normal. Skull base and vertebrae: Grade cervical alignment is normal. The atlantodental interval is not widened. No acute fracture of the cervical spine. Vertebral body height is preserved. Soft tissues and spinal canal: No prevertebral fluid or swelling. No visible canal hematoma. Disc levels: Disc  space narrowing and endplate remodeling is seen throughout the cervical spine most severe at C5-C7 in keeping with changes of moderate to severe degenerative disc disease. No significant canal stenosis. Multilevel uncovertebral and facet arthrosis results in multilevel moderate to severe neuroforaminal narrowing, most severe on the right at C3-4 and C5-6 and on the left at C5-6 and C6-7 Upper chest: Negative. Other: None IMPRESSION: 1. No acute fracture or listhesis of the cervical spine. 2. Multilevel degenerative disc and degenerative joint disease resulting in multilevel moderate to severe neuroforaminal narrowing, most severe on the right at C3-4 and C5-6 and on the left at C5-6 and C6-7. Electronically Signed   By: Helyn Numbers M.D.   On: 11/10/2023 02:14   CT Maxillofacial Wo Contrast Result Date: 11/10/2023 CLINICAL DATA:  Blunt facial trauma EXAM: CT MAXILLOFACIAL WITHOUT CONTRAST TECHNIQUE: Multidetector CT imaging of the maxillofacial structures was performed. Multiplanar CT image reconstructions were also generated. RADIATION DOSE REDUCTION: This exam was performed according to the departmental dose-optimization program which includes automated exposure control, adjustment of the mA and/or kV according to patient size and/or use of iterative reconstruction technique. COMPARISON:  None Available. FINDINGS: Osseous: No fracture or mandibular dislocation. No destructive process. Orbits: Moderate left preseptal soft tissue swelling. Ocular globes are intact. Ocular lenses have been removed. Retro-orbital fat is preserved. No retro-orbital mass lesion hematoma identified. Extraocular musculature and optic nerves are unremarkable. Sinuses: Clear. Soft tissues: Moderate soft tissue swelling noted over the nasal bridge. Moderate scalp hematoma noted superficial to the left frontal bone. Limited intracranial: Mild subarachnoid hemorrhage is partially visualized within the left parafalcine frontal lobe,  better seen on concurrently performed CT examination of the head. IMPRESSION: 1. No acute facial bone fracture. 2. Moderate left preseptal soft tissue swelling. 3. Moderate soft tissue swelling over the nasal bridge. 4. Moderate scalp hematoma superficial to the left frontal bone. 5. Mild subarachnoid hemorrhage partially visualized within the left parafalcine frontal lobe, better seen on concurrently performed CT examination of the head. Electronically Signed   By: Helyn Numbers M.D.   On: 11/10/2023 02:01   CT Head Wo Contrast Result Date: 11/10/2023 CLINICAL DATA:  Fall EXAM: CT HEAD WITHOUT CONTRAST TECHNIQUE: Contiguous axial images were obtained from the base of the skull through the vertex without intravenous contrast. RADIATION DOSE REDUCTION: This exam was performed according to the departmental dose-optimization program which includes automated exposure control, adjustment of the mA and/or kV according to patient size and/or use of iterative reconstruction technique. COMPARISON:  None Available. FINDINGS: Brain: There is a small amount of traumatic subarachnoid hemorrhage over both convexities, left-greater-than-right. Hypoattenuation of the white matter. Mild volume loss Vascular: There is atherosclerotic calcification of both internal carotid arteries at the skull base. Skull: Large left frontal scalp hematoma.  No skull fracture Sinuses/Orbits: No acute finding. Other: None. IMPRESSION: 1. Small amount of traumatic  subarachnoid hemorrhage over both convexities, left-greater-than-right. 2. Large left frontal scalp hematoma without skull fracture. Critical Value/emergent results were called by telephone at the time of interpretation on 11/10/2023 at 1:47 am to provider [facility] , who verbally acknowledged these results. Electronically Signed   By: Deatra Robinson M.D.   On: 11/10/2023 01:47   DG Knee Complete 4 Views Right Result Date: 11/10/2023 CLINICAL DATA:  Tripped and fell at home. Large  hematoma to left forehead. Pain to right knee. EXAM: RIGHT KNEE - COMPLETE 4+ VIEW COMPARISON:  None Available. FINDINGS: No evidence of fracture, dislocation, or joint effusion. No evidence of arthropathy or other focal bone abnormality. Soft tissues are unremarkable. IMPRESSION: Negative. Electronically Signed   By: Minerva Fester M.D.   On: 11/10/2023 01:45   DG Chest 2 View Result Date: 11/10/2023 CLINICAL DATA:  Tripped and fell at home. Large hematoma to left forehead. Pain to right knee. EXAM: CHEST - 2 VIEW COMPARISON:  05/25/2021 FINDINGS: Stable cardiomediastinal silhouette. Aortic atherosclerotic calcification. Low lung volumes. No focal consolidation, pleural effusion, or pneumothorax. No displaced rib fractures. IMPRESSION: No acute cardiopulmonary disease. Electronically Signed   By: Minerva Fester M.D.   On: 11/10/2023 01:44     Subjective: - no chest pain, shortness of breath, no abdominal pain, nausea or vomiting.   Discharge Exam: BP (!) 142/92 (BP Location: Right Arm)   Pulse (!) 59   Temp (!) 97.5 F (36.4 C) (Oral)   Resp 17   Ht 5' (1.524 m)   Wt 83.1 kg   SpO2 96%   BMI 35.78 kg/m   General: Pt is alert, awake, not in acute distress Cardiovascular: RRR, S1/S2 +, no rubs, no gallops Respiratory: CTA bilaterally, no wheezing, no rhonchi Abdominal: Soft, NT, ND, bowel sounds + Extremities: no edema, no cyanosis  The results of significant diagnostics from this hospitalization (including imaging, microbiology, ancillary and laboratory) are listed below for reference.     Microbiology: No results found for this or any previous visit (from the past 240 hours).   Labs: Basic Metabolic Panel: Recent Labs  Lab 11/10/23 0200 11/10/23 0650 11/11/23 0618  NA 137 137 137  K 4.1 3.9 3.8  CL 101 105 108  CO2 22 24 23   GLUCOSE 144* 136* 111*  BUN 16 16 19   CREATININE 0.85 1.00 1.19*  CALCIUM 9.2 8.9 8.8*  MG  --   --  2.0   Liver Function Tests: Recent  Labs  Lab 11/10/23 0200 11/10/23 0650 11/11/23 0618  AST 24 23 17   ALT 12 12 13   ALKPHOS 96 94 87  BILITOT 0.5 0.5 0.5  PROT 6.8 6.4* 6.5  ALBUMIN 3.3* 3.1* 3.1*   CBC: Recent Labs  Lab 11/10/23 0200 11/10/23 0650 11/11/23 0618  WBC 16.1* 11.5* 10.1  NEUTROABS 12.2*  --   --   HGB 12.6 12.0 11.9*  HCT 37.5 35.6* 36.2  MCV 84.7 84.8 85.2  PLT 265 258 242   CBG: Recent Labs  Lab 11/11/23 1151 11/11/23 1616 11/11/23 2345 11/12/23 0613 11/12/23 0904  GLUCAP 115* 188* 128* 129* 142*   Hgb A1c Recent Labs    11/10/23 0650  HGBA1C 7.0*   Lipid Profile No results for input(s): "CHOL", "HDL", "LDLCALC", "TRIG", "CHOLHDL", "LDLDIRECT" in the last 72 hours. Thyroid function studies Recent Labs    11/10/23 0650  TSH 4.930*   Urinalysis    Component Value Date/Time   COLORURINE YELLOW 11/10/2023 1603   APPEARANCEUR CLEAR 11/10/2023  1603   LABSPEC 1.014 11/10/2023 1603   PHURINE 5.0 11/10/2023 1603   GLUCOSEU >=500 (A) 11/10/2023 1603   HGBUR NEGATIVE 11/10/2023 1603   BILIRUBINUR NEGATIVE 11/10/2023 1603   BILIRUBINUR neg 09/21/2013 1236   KETONESUR NEGATIVE 11/10/2023 1603   PROTEINUR NEGATIVE 11/10/2023 1603   UROBILINOGEN negative 09/21/2013 1236   NITRITE NEGATIVE 11/10/2023 1603   LEUKOCYTESUR NEGATIVE 11/10/2023 1603    FURTHER DISCHARGE INSTRUCTIONS:   Get Medicines reviewed and adjusted: Please take all your medications with you for your next visit with your Primary MD   Laboratory/radiological data: Please request your Primary MD to go over all hospital tests and procedure/radiological results at the follow up, please ask your Primary MD to get all Hospital records sent to his/her office.   In some cases, they will be blood work, cultures and biopsy results pending at the time of your discharge. Please request that your primary care M.D. goes through all the records of your hospital data and follows up on these results.   Also Note the  following: If you experience worsening of your admission symptoms, develop shortness of breath, life threatening emergency, suicidal or homicidal thoughts you must seek medical attention immediately by calling 911 or calling your MD immediately  if symptoms less severe.   You must read complete instructions/literature along with all the possible adverse reactions/side effects for all the Medicines you take and that have been prescribed to you. Take any new Medicines after you have completely understood and accpet all the possible adverse reactions/side effects.    Do not drive when taking Pain medications or sleeping medications (Benzodaizepines)   Do not take more than prescribed Pain, Sleep and Anxiety Medications. It is not advisable to combine anxiety,sleep and pain medications without talking with your primary care practitioner   Special Instructions: If you have smoked or chewed Tobacco  in the last 2 yrs please stop smoking, stop any regular Alcohol  and or any Recreational drug use.   Wear Seat belts while driving.   Please note: You were cared for by a hospitalist during your hospital stay. Once you are discharged, your primary care physician will handle any further medical issues. Please note that NO REFILLS for any discharge medications will be authorized once you are discharged, as it is imperative that you return to your primary care physician (or establish a relationship with a primary care physician if you do not have one) for your post hospital discharge needs so that they can reassess your need for medications and monitor your lab values.  Time coordinating discharge: 35 minutes  SIGNED:  Pamella Pert, MD, PhD 11/12/2023, 10:47 AM

## 2023-11-12 NOTE — Final Progress Note (Signed)
Discharge medication explained to Almyra Free (daughter in law ) and relatives, discharged documents given.

## 2023-11-13 NOTE — ED Provider Notes (Signed)
[facility] Provider Note   CSN: [id number] Arrival date & time: 11/10/23  0034     History  Chief Complaint  Patient presents with   Marletta Lor    Caitlin Harrington is a 77 y.o. female.  Patient fell at home hitting her head causign a bruis to same. States it was mechanical. No syncope. No complaints at this time aside from pain but also can not provide any recent history such as date, what she had for supper, what time she woke up in the morning, what she did the day prior.    Fall       Home Medications Prior to Admission medications   Medication Sig Start Date End Date Taking? Authorizing Provider  allopurinol (ZYLOPRIM) 300 MG tablet Take 150 mg by mouth daily.   Yes [provider]  carvedilol (COREG) 6.25 MG tablet Take 6.25 mg by mouth 2 (two) times daily with a meal.   Yes [provider]  hydrOXYzine (ATARAX/VISTARIL) 25 MG tablet Take 25 mg by mouth at bedtime. 03/13/19  Yes [provider]  JARDIANCE 10 MG TABS tablet Take 10 mg by mouth daily. 10/01/23  Yes [provider]  levETIRAcetam (KEPPRA) 500 MG tablet Take 1 tablet (500 mg total) by mouth 2 (two) times daily for 6 days. 11/12/23 11/18/23 Yes Gherghe, Daylene Katayama, MD  levothyroxine (SYNTHROID) 50 MCG tablet Take 50 mcg by mouth daily before breakfast.   Yes [provider]  metFORMIN (GLUCOPHAGE) 500 MG tablet Take 500 mg by mouth 2 (two) times daily with a meal.   Yes [provider]  Multiple Vitamins-Minerals (MULTIVITAMIN WITH MINERALS) tablet Take 1 tablet by mouth daily.   Yes [provider]  rosuvastatin (CRESTOR) 20 MG tablet Take 1 tablet (20 mg total) by mouth daily. 05/07/21  Yes Pokhrel, [name], MD  sitaGLIPtin (JANUVIA) 100 MG tablet Take 100 mg by mouth daily. 11/07/20  Yes [provider]  amLODipine (NORVASC) 5 MG tablet Take 1 tablet (5 mg total) by mouth daily. Patient not taking: Reported on 11/10/2023 05/28/21    Catarina Hartshorn, MD  escitalopram (LEXAPRO) 20 MG tablet Take 1 tablet (20 mg total) by mouth daily. 11/12/23   Leatha Gilding, MD  losartan (COZAAR) 25 MG tablet Take 1 tablet (25 mg total) by mouth daily. Patient not taking: Reported on 11/10/2023 05/27/21   Catarina Hartshorn, MD      Allergies    Lactose intolerance (gi), Meloxicam, Amlodipine, Atorvastatin, Doxycycline, Ibuprofen, Meloxicam, and Statins    Review of Systems   Review of Systems  Physical Exam Updated Vital Signs BP 114/63 (BP Location: Right Arm)   Pulse 62   Temp 97.6 F (36.4 C) (Oral)   Resp 17   Ht 5' (1.524 m)   Wt 83.1 kg   SpO2 98%   BMI 35.78 kg/m  Physical Exam Vitals and nursing note reviewed.  Constitutional:      Appearance: She is well-developed.  HENT:     Head: Normocephalic.     Comments: Large hematoma to midline forehead    Mouth/Throat:     Mouth: Mucous membranes are moist.  Eyes:     Pupils: Pupils are equal, round, and reactive to light.  Cardiovascular:     Rate and Rhythm: Normal rate and regular rhythm.  Pulmonary:     Effort: No respiratory distress.     Breath sounds: No stridor.  Abdominal:     General: Abdomen is flat.  There is no distension.  Musculoskeletal:        General: Normal range of motion.     Cervical back: Normal range of motion.  Skin:    General: Skin is warm and dry.  Neurological:     General: No focal deficit present.     Mental Status: She is alert. She is disoriented and confused.     Cranial Nerves: No facial asymmetry.     Motor: No weakness.     Gait: Gait normal.     ED Results / Procedures / Treatments   Labs (all labs ordered are listed, but only abnormal results are displayed) Labs Reviewed  CBC WITH DIFFERENTIAL/PLATELET - Abnormal; Notable for the following components:      Result Value   WBC 16.1 (*)    Neutro Abs 12.2 (*)    All other components within normal limits  COMPREHENSIVE METABOLIC PANEL - Abnormal; Notable for the following  components:   Glucose, Bld 144 (*)    Albumin 3.3 (*)    All other components within normal limits  URINALYSIS, W/ REFLEX TO CULTURE (INFECTION SUSPECTED) - Abnormal; Notable for the following components:   Glucose, UA >=500 (*)    Ketones, ur 5 (*)    All other components within normal limits  URINALYSIS, W/ REFLEX TO CULTURE (INFECTION SUSPECTED) - Abnormal; Notable for the following components:   Glucose, UA >=500 (*)    All other components within normal limits  HEMOGLOBIN A1C - Abnormal; Notable for the following components:   Hgb A1c MFr Bld 7.0 (*)    All other components within normal limits  TSH - Abnormal; Notable for the following components:   TSH 4.930 (*)    All other components within normal limits  COMPREHENSIVE METABOLIC PANEL - Abnormal; Notable for the following components:   Glucose, Bld 136 (*)    Total Protein 6.4 (*)    Albumin 3.1 (*)    GFR, Estimated 59 (*)    All other components within normal limits  CBC - Abnormal; Notable for the following components:   WBC 11.5 (*)    HCT 35.6 (*)    All other components within normal limits  SEDIMENTATION RATE - Abnormal; Notable for the following components:   Sed Rate 40 (*)    All other components within normal limits  GLUCOSE, CAPILLARY - Abnormal; Notable for the following components:   Glucose-Capillary 121 (*)    All other components within normal limits  COMPREHENSIVE METABOLIC PANEL - Abnormal; Notable for the following components:   Glucose, Bld 111 (*)    Creatinine, Ser 1.19 (*)    Calcium 8.8 (*)    Albumin 3.1 (*)    GFR, Estimated 48 (*)    All other components within normal limits  CBC - Abnormal; Notable for the following components:   Hemoglobin 11.9 (*)    All other components within normal limits  GLUCOSE, CAPILLARY - Abnormal; Notable for the following components:   Glucose-Capillary 196 (*)    All other components within normal limits  GLUCOSE, CAPILLARY - Abnormal; Notable for the  following components:   Glucose-Capillary 107 (*)    All other components within normal limits  GLUCOSE, CAPILLARY - Abnormal; Notable for the following components:   Glucose-Capillary 115 (*)    All other components within normal limits  GLUCOSE, CAPILLARY - Abnormal; Notable for the following components:   Glucose-Capillary 188 (*)    All other components within normal limits  GLUCOSE,  CAPILLARY - Abnormal; Notable for the following components:   Glucose-Capillary 128 (*)    All other components within normal limits  GLUCOSE, CAPILLARY - Abnormal; Notable for the following components:   Glucose-Capillary 129 (*)    All other components within normal limits  GLUCOSE, CAPILLARY - Abnormal; Notable for the following components:   Glucose-Capillary 142 (*)    All other components within normal limits  GLUCOSE, CAPILLARY - Abnormal; Notable for the following components:   Glucose-Capillary 254 (*)    All other components within normal limits  CBG MONITORING, ED - Abnormal; Notable for the following components:   Glucose-Capillary 129 (*)    All other components within normal limits  CBG MONITORING, ED - Abnormal; Notable for the following components:   Glucose-Capillary 112 (*)    All other components within normal limits  MAGNESIUM    EKG EKG Interpretation Date/Time:  Wednesday November 10 2023 02:37:32 EST Ventricular Rate:  80 PR Interval:  180 QRS Duration:  78 QT Interval:  372 QTC Calculation: 429 R Axis:   -34  Text Interpretation: Normal sinus rhythm Left axis deviation Low voltage QRS Nonspecific T wave abnormality Abnormal ECG When compared with ECG of 25-May-2021 16:59, PREVIOUS ECG IS PRESENT Confirmed by Virgina Norfolk [phone]) on 11/11/2023 10:29:33 AM  Radiology No results found.  Procedures .Critical Care  Performed by: Marily Memos, MD Authorized by: Marily Memos, MD   Critical care provider statement:    Critical care time (minutes):  30   Critical  care was necessary to treat or prevent imminent or life-threatening deterioration of the following conditions:  CNS failure or compromise   Critical care was time spent personally by me on the following activities:  Development of treatment plan with patient or surrogate, discussions with consultants, evaluation of patient's response to treatment, examination of patient, ordering and review of laboratory studies, ordering and review of radiographic studies, ordering and performing treatments and interventions, pulse oximetry, re-evaluation of patient's condition and review of old charts     Medications Ordered in ED Medications - No data to display  ED Course/ Medical Decision Making/ A&P                                 Medical Decision Making Amount and/or Complexity of Data Reviewed Labs: ordered. Radiology: ordered. ECG/medicine tests: ordered.  Risk Decision regarding hospitalization.   Traumatic SAH with concussive symptoms. No other obviosu neurologic changes. No other obvious injuries. No anticoagulants needing reversed. D/w NSG who requests repeat head CT in 8 hours because of plavix. They will co-manage, admit to [facility].   Final Clinical Impression(s) / ED Diagnoses Final diagnoses:  Subarachnoid hemorrhage (HCC)    Rx / DC Orders ED Discharge Orders          Ordered    levETIRAcetam (KEPRRA) 500 MG/100ML SOLN  2 times daily,   Status:  Discontinued        11/12/23 1024    levETIRAcetam (KEPPRA) 500 MG tablet  2 times daily        11/12/23 1044    escitalopram (LEXAPRO) 20 MG tablet  Daily        11/12/23 1047              Suri Tafolla, Barbara Cower, MD 11/13/23 [phone]

## 2023-11-16 DIAGNOSIS — Z6839 Body mass index (BMI) 39.0-39.9, adult: Secondary | ICD-10-CM | POA: Diagnosis not present

## 2023-11-16 DIAGNOSIS — M109 Gout, unspecified: Secondary | ICD-10-CM | POA: Diagnosis not present

## 2023-11-16 DIAGNOSIS — E559 Vitamin D deficiency, unspecified: Secondary | ICD-10-CM | POA: Diagnosis not present

## 2023-11-16 DIAGNOSIS — I69311 Memory deficit following cerebral infarction: Secondary | ICD-10-CM | POA: Diagnosis not present

## 2023-11-16 DIAGNOSIS — E039 Hypothyroidism, unspecified: Secondary | ICD-10-CM | POA: Diagnosis not present

## 2023-11-16 DIAGNOSIS — W19XXXD Unspecified fall, subsequent encounter: Secondary | ICD-10-CM | POA: Diagnosis not present

## 2023-11-16 DIAGNOSIS — F32A Depression, unspecified: Secondary | ICD-10-CM | POA: Diagnosis not present

## 2023-11-16 DIAGNOSIS — S066XAA Traumatic subarachnoid hemorrhage with loss of consciousness status unknown, initial encounter: Secondary | ICD-10-CM | POA: Diagnosis not present

## 2023-11-16 DIAGNOSIS — E119 Type 2 diabetes mellitus without complications: Secondary | ICD-10-CM | POA: Diagnosis not present

## 2023-11-16 DIAGNOSIS — R269 Unspecified abnormalities of gait and mobility: Secondary | ICD-10-CM | POA: Diagnosis not present

## 2023-11-16 DIAGNOSIS — I10 Essential (primary) hypertension: Secondary | ICD-10-CM | POA: Diagnosis not present

## 2023-11-16 DIAGNOSIS — E66812 Obesity, class 2: Secondary | ICD-10-CM | POA: Diagnosis not present

## 2023-11-16 DIAGNOSIS — J45909 Unspecified asthma, uncomplicated: Secondary | ICD-10-CM | POA: Diagnosis not present

## 2023-11-16 DIAGNOSIS — Z7984 Long term (current) use of oral hypoglycemic drugs: Secondary | ICD-10-CM | POA: Diagnosis not present

## 2023-11-25 DIAGNOSIS — E119 Type 2 diabetes mellitus without complications: Secondary | ICD-10-CM | POA: Diagnosis not present

## 2023-11-25 DIAGNOSIS — F32A Depression, unspecified: Secondary | ICD-10-CM | POA: Diagnosis not present

## 2023-11-25 DIAGNOSIS — S066XAA Traumatic subarachnoid hemorrhage with loss of consciousness status unknown, initial encounter: Secondary | ICD-10-CM | POA: Diagnosis not present

## 2023-11-25 DIAGNOSIS — Z7984 Long term (current) use of oral hypoglycemic drugs: Secondary | ICD-10-CM | POA: Diagnosis not present

## 2023-11-25 DIAGNOSIS — I69311 Memory deficit following cerebral infarction: Secondary | ICD-10-CM | POA: Diagnosis not present

## 2023-11-25 DIAGNOSIS — J45909 Unspecified asthma, uncomplicated: Secondary | ICD-10-CM | POA: Diagnosis not present

## 2023-11-30 DIAGNOSIS — Z7984 Long term (current) use of oral hypoglycemic drugs: Secondary | ICD-10-CM | POA: Diagnosis not present

## 2023-11-30 DIAGNOSIS — J45909 Unspecified asthma, uncomplicated: Secondary | ICD-10-CM | POA: Diagnosis not present

## 2023-11-30 DIAGNOSIS — I69311 Memory deficit following cerebral infarction: Secondary | ICD-10-CM | POA: Diagnosis not present

## 2023-11-30 DIAGNOSIS — E119 Type 2 diabetes mellitus without complications: Secondary | ICD-10-CM | POA: Diagnosis not present

## 2023-11-30 DIAGNOSIS — F32A Depression, unspecified: Secondary | ICD-10-CM | POA: Diagnosis not present

## 2023-11-30 DIAGNOSIS — S066XAA Traumatic subarachnoid hemorrhage with loss of consciousness status unknown, initial encounter: Secondary | ICD-10-CM | POA: Diagnosis not present

## 2023-12-02 DIAGNOSIS — Z7984 Long term (current) use of oral hypoglycemic drugs: Secondary | ICD-10-CM | POA: Diagnosis not present

## 2023-12-02 DIAGNOSIS — F32A Depression, unspecified: Secondary | ICD-10-CM | POA: Diagnosis not present

## 2023-12-02 DIAGNOSIS — E119 Type 2 diabetes mellitus without complications: Secondary | ICD-10-CM | POA: Diagnosis not present

## 2023-12-02 DIAGNOSIS — J45909 Unspecified asthma, uncomplicated: Secondary | ICD-10-CM | POA: Diagnosis not present

## 2023-12-02 DIAGNOSIS — S066XAA Traumatic subarachnoid hemorrhage with loss of consciousness status unknown, initial encounter: Secondary | ICD-10-CM | POA: Diagnosis not present

## 2023-12-02 DIAGNOSIS — I69311 Memory deficit following cerebral infarction: Secondary | ICD-10-CM | POA: Diagnosis not present

## 2023-12-07 DIAGNOSIS — E039 Hypothyroidism, unspecified: Secondary | ICD-10-CM | POA: Diagnosis not present

## 2023-12-07 DIAGNOSIS — E785 Hyperlipidemia, unspecified: Secondary | ICD-10-CM | POA: Diagnosis not present

## 2023-12-07 DIAGNOSIS — Z09 Encounter for follow-up examination after completed treatment for conditions other than malignant neoplasm: Secondary | ICD-10-CM | POA: Diagnosis not present

## 2023-12-07 DIAGNOSIS — G4733 Obstructive sleep apnea (adult) (pediatric): Secondary | ICD-10-CM | POA: Diagnosis not present

## 2023-12-07 DIAGNOSIS — Z8673 Personal history of transient ischemic attack (TIA), and cerebral infarction without residual deficits: Secondary | ICD-10-CM | POA: Diagnosis not present

## 2023-12-07 DIAGNOSIS — S066X1D Traumatic subarachnoid hemorrhage with loss of consciousness of 30 minutes or less, subsequent encounter: Secondary | ICD-10-CM | POA: Diagnosis not present

## 2023-12-07 DIAGNOSIS — Z23 Encounter for immunization: Secondary | ICD-10-CM | POA: Diagnosis not present

## 2023-12-07 DIAGNOSIS — F329 Major depressive disorder, single episode, unspecified: Secondary | ICD-10-CM | POA: Diagnosis not present

## 2023-12-07 DIAGNOSIS — E1159 Type 2 diabetes mellitus with other circulatory complications: Secondary | ICD-10-CM | POA: Diagnosis not present

## 2023-12-07 DIAGNOSIS — Z9181 History of falling: Secondary | ICD-10-CM | POA: Diagnosis not present

## 2023-12-07 DIAGNOSIS — N1831 Chronic kidney disease, stage 3a: Secondary | ICD-10-CM | POA: Diagnosis not present

## 2023-12-08 DIAGNOSIS — J45909 Unspecified asthma, uncomplicated: Secondary | ICD-10-CM | POA: Diagnosis not present

## 2023-12-08 DIAGNOSIS — I69311 Memory deficit following cerebral infarction: Secondary | ICD-10-CM | POA: Diagnosis not present

## 2023-12-08 DIAGNOSIS — E119 Type 2 diabetes mellitus without complications: Secondary | ICD-10-CM | POA: Diagnosis not present

## 2023-12-08 DIAGNOSIS — F32A Depression, unspecified: Secondary | ICD-10-CM | POA: Diagnosis not present

## 2023-12-08 DIAGNOSIS — Z7984 Long term (current) use of oral hypoglycemic drugs: Secondary | ICD-10-CM | POA: Diagnosis not present

## 2023-12-08 DIAGNOSIS — S066XAA Traumatic subarachnoid hemorrhage with loss of consciousness status unknown, initial encounter: Secondary | ICD-10-CM | POA: Diagnosis not present

## 2023-12-10 DIAGNOSIS — I69311 Memory deficit following cerebral infarction: Secondary | ICD-10-CM | POA: Diagnosis not present

## 2023-12-10 DIAGNOSIS — F32A Depression, unspecified: Secondary | ICD-10-CM | POA: Diagnosis not present

## 2023-12-10 DIAGNOSIS — Z7984 Long term (current) use of oral hypoglycemic drugs: Secondary | ICD-10-CM | POA: Diagnosis not present

## 2023-12-10 DIAGNOSIS — E119 Type 2 diabetes mellitus without complications: Secondary | ICD-10-CM | POA: Diagnosis not present

## 2023-12-10 DIAGNOSIS — S066XAA Traumatic subarachnoid hemorrhage with loss of consciousness status unknown, initial encounter: Secondary | ICD-10-CM | POA: Diagnosis not present

## 2023-12-10 DIAGNOSIS — J45909 Unspecified asthma, uncomplicated: Secondary | ICD-10-CM | POA: Diagnosis not present

## 2023-12-14 DIAGNOSIS — Z7984 Long term (current) use of oral hypoglycemic drugs: Secondary | ICD-10-CM | POA: Diagnosis not present

## 2023-12-14 DIAGNOSIS — F32A Depression, unspecified: Secondary | ICD-10-CM | POA: Diagnosis not present

## 2023-12-14 DIAGNOSIS — J45909 Unspecified asthma, uncomplicated: Secondary | ICD-10-CM | POA: Diagnosis not present

## 2023-12-14 DIAGNOSIS — E119 Type 2 diabetes mellitus without complications: Secondary | ICD-10-CM | POA: Diagnosis not present

## 2023-12-14 DIAGNOSIS — I69311 Memory deficit following cerebral infarction: Secondary | ICD-10-CM | POA: Diagnosis not present

## 2023-12-14 DIAGNOSIS — S066XAA Traumatic subarachnoid hemorrhage with loss of consciousness status unknown, initial encounter: Secondary | ICD-10-CM | POA: Diagnosis not present

## 2023-12-16 DIAGNOSIS — F32A Depression, unspecified: Secondary | ICD-10-CM | POA: Diagnosis not present

## 2023-12-16 DIAGNOSIS — Z6839 Body mass index (BMI) 39.0-39.9, adult: Secondary | ICD-10-CM | POA: Diagnosis not present

## 2023-12-16 DIAGNOSIS — R269 Unspecified abnormalities of gait and mobility: Secondary | ICD-10-CM | POA: Diagnosis not present

## 2023-12-16 DIAGNOSIS — I10 Essential (primary) hypertension: Secondary | ICD-10-CM | POA: Diagnosis not present

## 2023-12-16 DIAGNOSIS — I69311 Memory deficit following cerebral infarction: Secondary | ICD-10-CM | POA: Diagnosis not present

## 2023-12-16 DIAGNOSIS — E039 Hypothyroidism, unspecified: Secondary | ICD-10-CM | POA: Diagnosis not present

## 2023-12-16 DIAGNOSIS — M109 Gout, unspecified: Secondary | ICD-10-CM | POA: Diagnosis not present

## 2023-12-16 DIAGNOSIS — Z7984 Long term (current) use of oral hypoglycemic drugs: Secondary | ICD-10-CM | POA: Diagnosis not present

## 2023-12-16 DIAGNOSIS — J45909 Unspecified asthma, uncomplicated: Secondary | ICD-10-CM | POA: Diagnosis not present

## 2023-12-16 DIAGNOSIS — W19XXXD Unspecified fall, subsequent encounter: Secondary | ICD-10-CM | POA: Diagnosis not present

## 2023-12-16 DIAGNOSIS — E119 Type 2 diabetes mellitus without complications: Secondary | ICD-10-CM | POA: Diagnosis not present

## 2023-12-16 DIAGNOSIS — E559 Vitamin D deficiency, unspecified: Secondary | ICD-10-CM | POA: Diagnosis not present

## 2023-12-16 DIAGNOSIS — S066XAA Traumatic subarachnoid hemorrhage with loss of consciousness status unknown, initial encounter: Secondary | ICD-10-CM | POA: Diagnosis not present

## 2023-12-16 DIAGNOSIS — E66812 Obesity, class 2: Secondary | ICD-10-CM | POA: Diagnosis not present

## 2023-12-23 DIAGNOSIS — Z7984 Long term (current) use of oral hypoglycemic drugs: Secondary | ICD-10-CM | POA: Diagnosis not present

## 2023-12-23 DIAGNOSIS — S066XAA Traumatic subarachnoid hemorrhage with loss of consciousness status unknown, initial encounter: Secondary | ICD-10-CM | POA: Diagnosis not present

## 2023-12-23 DIAGNOSIS — I69311 Memory deficit following cerebral infarction: Secondary | ICD-10-CM | POA: Diagnosis not present

## 2023-12-23 DIAGNOSIS — J45909 Unspecified asthma, uncomplicated: Secondary | ICD-10-CM | POA: Diagnosis not present

## 2023-12-23 DIAGNOSIS — F32A Depression, unspecified: Secondary | ICD-10-CM | POA: Diagnosis not present

## 2023-12-23 DIAGNOSIS — E119 Type 2 diabetes mellitus without complications: Secondary | ICD-10-CM | POA: Diagnosis not present

## 2023-12-30 DIAGNOSIS — S066XAA Traumatic subarachnoid hemorrhage with loss of consciousness status unknown, initial encounter: Secondary | ICD-10-CM | POA: Diagnosis not present

## 2023-12-30 DIAGNOSIS — E119 Type 2 diabetes mellitus without complications: Secondary | ICD-10-CM | POA: Diagnosis not present

## 2023-12-30 DIAGNOSIS — J45909 Unspecified asthma, uncomplicated: Secondary | ICD-10-CM | POA: Diagnosis not present

## 2023-12-30 DIAGNOSIS — I69311 Memory deficit following cerebral infarction: Secondary | ICD-10-CM | POA: Diagnosis not present

## 2023-12-30 DIAGNOSIS — F32A Depression, unspecified: Secondary | ICD-10-CM | POA: Diagnosis not present

## 2023-12-30 DIAGNOSIS — Z7984 Long term (current) use of oral hypoglycemic drugs: Secondary | ICD-10-CM | POA: Diagnosis not present

## 2024-01-04 DIAGNOSIS — S066XAA Traumatic subarachnoid hemorrhage with loss of consciousness status unknown, initial encounter: Secondary | ICD-10-CM | POA: Diagnosis not present

## 2024-01-04 DIAGNOSIS — E119 Type 2 diabetes mellitus without complications: Secondary | ICD-10-CM | POA: Diagnosis not present

## 2024-01-04 DIAGNOSIS — F32A Depression, unspecified: Secondary | ICD-10-CM | POA: Diagnosis not present

## 2024-01-04 DIAGNOSIS — I69311 Memory deficit following cerebral infarction: Secondary | ICD-10-CM | POA: Diagnosis not present

## 2024-01-04 DIAGNOSIS — J45909 Unspecified asthma, uncomplicated: Secondary | ICD-10-CM | POA: Diagnosis not present

## 2024-01-04 DIAGNOSIS — Z7984 Long term (current) use of oral hypoglycemic drugs: Secondary | ICD-10-CM | POA: Diagnosis not present

## 2024-01-12 DIAGNOSIS — J45909 Unspecified asthma, uncomplicated: Secondary | ICD-10-CM | POA: Diagnosis not present

## 2024-01-12 DIAGNOSIS — Z7984 Long term (current) use of oral hypoglycemic drugs: Secondary | ICD-10-CM | POA: Diagnosis not present

## 2024-01-12 DIAGNOSIS — S066XAA Traumatic subarachnoid hemorrhage with loss of consciousness status unknown, initial encounter: Secondary | ICD-10-CM | POA: Diagnosis not present

## 2024-01-12 DIAGNOSIS — I69311 Memory deficit following cerebral infarction: Secondary | ICD-10-CM | POA: Diagnosis not present

## 2024-01-12 DIAGNOSIS — E119 Type 2 diabetes mellitus without complications: Secondary | ICD-10-CM | POA: Diagnosis not present

## 2024-01-12 DIAGNOSIS — F32A Depression, unspecified: Secondary | ICD-10-CM | POA: Diagnosis not present

## 2024-03-08 DIAGNOSIS — F411 Generalized anxiety disorder: Secondary | ICD-10-CM | POA: Diagnosis not present

## 2024-03-15 DIAGNOSIS — F411 Generalized anxiety disorder: Secondary | ICD-10-CM | POA: Diagnosis not present

## 2024-03-21 DIAGNOSIS — R42 Dizziness and giddiness: Secondary | ICD-10-CM | POA: Diagnosis not present

## 2024-03-21 DIAGNOSIS — Z6833 Body mass index (BMI) 33.0-33.9, adult: Secondary | ICD-10-CM | POA: Diagnosis not present

## 2024-03-21 DIAGNOSIS — I10 Essential (primary) hypertension: Secondary | ICD-10-CM | POA: Diagnosis not present

## 2024-03-21 DIAGNOSIS — F329 Major depressive disorder, single episode, unspecified: Secondary | ICD-10-CM | POA: Diagnosis not present

## 2024-03-21 DIAGNOSIS — Z7985 Long-term (current) use of injectable non-insulin antidiabetic drugs: Secondary | ICD-10-CM | POA: Diagnosis not present

## 2024-03-21 DIAGNOSIS — E1159 Type 2 diabetes mellitus with other circulatory complications: Secondary | ICD-10-CM | POA: Diagnosis not present

## 2024-03-21 DIAGNOSIS — E785 Hyperlipidemia, unspecified: Secondary | ICD-10-CM | POA: Diagnosis not present

## 2024-03-22 DIAGNOSIS — F411 Generalized anxiety disorder: Secondary | ICD-10-CM | POA: Diagnosis not present

## 2024-04-05 DIAGNOSIS — F411 Generalized anxiety disorder: Secondary | ICD-10-CM | POA: Diagnosis not present

## 2024-04-20 DIAGNOSIS — F411 Generalized anxiety disorder: Secondary | ICD-10-CM | POA: Diagnosis not present

## 2024-04-26 DIAGNOSIS — F411 Generalized anxiety disorder: Secondary | ICD-10-CM | POA: Diagnosis not present

## 2024-05-10 DIAGNOSIS — F411 Generalized anxiety disorder: Secondary | ICD-10-CM | POA: Diagnosis not present

## 2024-06-07 DIAGNOSIS — F411 Generalized anxiety disorder: Secondary | ICD-10-CM | POA: Diagnosis not present

## 2024-06-20 DIAGNOSIS — E785 Hyperlipidemia, unspecified: Secondary | ICD-10-CM | POA: Diagnosis not present

## 2024-06-20 DIAGNOSIS — E119 Type 2 diabetes mellitus without complications: Secondary | ICD-10-CM | POA: Diagnosis not present

## 2024-06-20 DIAGNOSIS — I10 Essential (primary) hypertension: Secondary | ICD-10-CM | POA: Diagnosis not present

## 2024-06-20 DIAGNOSIS — Z6834 Body mass index (BMI) 34.0-34.9, adult: Secondary | ICD-10-CM | POA: Diagnosis not present

## 2024-06-20 DIAGNOSIS — E039 Hypothyroidism, unspecified: Secondary | ICD-10-CM | POA: Diagnosis not present

## 2024-06-21 DIAGNOSIS — F411 Generalized anxiety disorder: Secondary | ICD-10-CM | POA: Diagnosis not present

## 2024-07-19 DIAGNOSIS — F411 Generalized anxiety disorder: Secondary | ICD-10-CM | POA: Diagnosis not present

## 2024-08-16 DIAGNOSIS — F411 Generalized anxiety disorder: Secondary | ICD-10-CM | POA: Diagnosis not present

## 2024-08-30 DIAGNOSIS — F411 Generalized anxiety disorder: Secondary | ICD-10-CM | POA: Diagnosis not present

## 2024-09-13 DIAGNOSIS — F411 Generalized anxiety disorder: Secondary | ICD-10-CM | POA: Diagnosis not present
